# Patient Record
Sex: Female | Born: 1950 | Race: White | Hispanic: No | Smoking: Former smoker
Health system: Southern US, Community
[De-identification: ages and names within clinical notes are randomized; demographics above are authoritative.]

## PROBLEM LIST (undated history)

## (undated) DIAGNOSIS — G2581 Restless legs syndrome: Secondary | ICD-10-CM

## (undated) DIAGNOSIS — Z8719 Personal history of other diseases of the digestive system: Secondary | ICD-10-CM

## (undated) DIAGNOSIS — D509 Iron deficiency anemia, unspecified: Secondary | ICD-10-CM

## (undated) DIAGNOSIS — G4730 Sleep apnea, unspecified: Secondary | ICD-10-CM

## (undated) DIAGNOSIS — K580 Irritable bowel syndrome with diarrhea: Secondary | ICD-10-CM

## (undated) DIAGNOSIS — K219 Gastro-esophageal reflux disease without esophagitis: Secondary | ICD-10-CM

## (undated) DIAGNOSIS — M1712 Unilateral primary osteoarthritis, left knee: Secondary | ICD-10-CM

## (undated) DIAGNOSIS — F32A Depression, unspecified: Secondary | ICD-10-CM

## (undated) DIAGNOSIS — K638219 Small intestinal bacterial overgrowth, unspecified: Secondary | ICD-10-CM

## (undated) DIAGNOSIS — M1990 Unspecified osteoarthritis, unspecified site: Secondary | ICD-10-CM

## (undated) DIAGNOSIS — E785 Hyperlipidemia, unspecified: Secondary | ICD-10-CM

## (undated) DIAGNOSIS — M4800 Spinal stenosis, site unspecified: Secondary | ICD-10-CM

## (undated) DIAGNOSIS — G935 Compression of brain: Secondary | ICD-10-CM

## (undated) DIAGNOSIS — K6389 Other specified diseases of intestine: Secondary | ICD-10-CM

## (undated) DIAGNOSIS — Z8489 Family history of other specified conditions: Secondary | ICD-10-CM

## (undated) DIAGNOSIS — I10 Essential (primary) hypertension: Secondary | ICD-10-CM

## (undated) DIAGNOSIS — F419 Anxiety disorder, unspecified: Secondary | ICD-10-CM

## (undated) DIAGNOSIS — E119 Type 2 diabetes mellitus without complications: Secondary | ICD-10-CM

## (undated) DIAGNOSIS — D649 Anemia, unspecified: Secondary | ICD-10-CM

## (undated) DIAGNOSIS — Z9889 Other specified postprocedural states: Secondary | ICD-10-CM

## (undated) DIAGNOSIS — J302 Other seasonal allergic rhinitis: Secondary | ICD-10-CM

## (undated) DIAGNOSIS — F329 Major depressive disorder, single episode, unspecified: Secondary | ICD-10-CM

## (undated) DIAGNOSIS — R112 Nausea with vomiting, unspecified: Secondary | ICD-10-CM

## (undated) HISTORY — DX: Major depressive disorder, single episode, unspecified: F32.9

## (undated) HISTORY — DX: Sleep apnea, unspecified: G47.30

## (undated) HISTORY — DX: Hyperlipidemia, unspecified: E78.5

## (undated) HISTORY — DX: Spinal stenosis, site unspecified: M48.00

## (undated) HISTORY — DX: Depression, unspecified: F32.A

## (undated) HISTORY — DX: Unspecified osteoarthritis, unspecified site: M19.90

## (undated) HISTORY — DX: Gastro-esophageal reflux disease without esophagitis: K21.9

## (undated) HISTORY — PX: LAPAROSCOPIC GASTRIC SLEEVE RESECTION: SHX5895

## (undated) HISTORY — PX: HERNIA REPAIR: SHX51

## (undated) HISTORY — PX: BREAST BIOPSY: SHX20

## (undated) HISTORY — DX: Compression of brain: G93.5

## (undated) HISTORY — DX: Essential (primary) hypertension: I10

## (undated) HISTORY — DX: Other specified diseases of intestine: K63.89

## (undated) HISTORY — PX: LASIK: SHX215

## (undated) HISTORY — PX: NASAL SEPTUM SURGERY: SHX37

## (undated) HISTORY — DX: Type 2 diabetes mellitus without complications: E11.9

## (undated) HISTORY — DX: Small intestinal bacterial overgrowth, unspecified: K63.8219

## (undated) HISTORY — PX: OTHER SURGICAL HISTORY: SHX169

---

## 2005-10-18 HISTORY — PX: OTHER SURGICAL HISTORY: SHX169

## 2007-04-09 ENCOUNTER — Ambulatory Visit: Payer: Self-pay | Admitting: Family Medicine

## 2007-05-16 ENCOUNTER — Ambulatory Visit: Payer: Self-pay | Admitting: Family Medicine

## 2008-11-06 ENCOUNTER — Ambulatory Visit: Payer: Self-pay | Admitting: Family Medicine

## 2009-04-28 ENCOUNTER — Ambulatory Visit: Payer: Self-pay | Admitting: Family Medicine

## 2009-05-06 ENCOUNTER — Ambulatory Visit: Payer: Self-pay | Admitting: Family Medicine

## 2009-06-03 ENCOUNTER — Ambulatory Visit: Payer: Self-pay | Admitting: Gastroenterology

## 2009-06-07 ENCOUNTER — Ambulatory Visit: Payer: Self-pay | Admitting: Family Medicine

## 2010-02-10 ENCOUNTER — Ambulatory Visit: Payer: Self-pay | Admitting: General Surgery

## 2010-02-18 ENCOUNTER — Ambulatory Visit: Payer: Self-pay | Admitting: General Surgery

## 2010-06-18 ENCOUNTER — Emergency Department: Payer: Self-pay | Admitting: Emergency Medicine

## 2010-06-27 ENCOUNTER — Ambulatory Visit: Payer: Self-pay | Admitting: Family Medicine

## 2011-05-31 ENCOUNTER — Ambulatory Visit: Payer: Self-pay | Admitting: Family Medicine

## 2012-07-24 ENCOUNTER — Ambulatory Visit: Payer: Self-pay | Admitting: Specialist

## 2012-07-24 LAB — CBC WITH DIFFERENTIAL/PLATELET
Basophil %: 0.4 %
Eosinophil #: 0.2 10*3/uL (ref 0.0–0.7)
Eosinophil %: 2.9 %
Lymphocyte %: 17 %
MCH: 29.6 pg (ref 26.0–34.0)
MCHC: 34.4 g/dL (ref 32.0–36.0)
Monocyte %: 6 %
Neutrophil #: 5.2 10*3/uL (ref 1.4–6.5)
Platelet: 248 10*3/uL (ref 150–440)
RBC: 4.19 10*6/uL (ref 3.80–5.20)

## 2012-07-24 LAB — COMPREHENSIVE METABOLIC PANEL
Alkaline Phosphatase: 102 U/L (ref 50–136)
Anion Gap: 5 — ABNORMAL LOW (ref 7–16)
BUN: 17 mg/dL (ref 7–18)
Chloride: 104 mmol/L (ref 98–107)
Co2: 33 mmol/L — ABNORMAL HIGH (ref 21–32)
Creatinine: 0.88 mg/dL (ref 0.60–1.30)
EGFR (African American): >60
Glucose: 117 mg/dL — ABNORMAL HIGH (ref 65–99)
Osmolality: 286 (ref 275–301)
SGOT(AST): 26 U/L (ref 15–37)
SGPT (ALT): 26 U/L (ref 12–78)
Sodium: 142 mmol/L (ref 136–145)

## 2012-07-24 LAB — PROTIME-INR
INR: 1
Prothrombin Time: 13.5 secs (ref 11.5–14.7)

## 2012-07-24 LAB — IRON AND TIBC
Iron Bind.Cap.(Total): 296 ug/dL (ref 250–450)
Unbound Iron-Bind.Cap.: 243 ug/dL

## 2012-07-24 LAB — AMYLASE: Amylase: 31 U/L (ref 25–115)

## 2012-07-24 LAB — HEMOGLOBIN A1C: Hemoglobin A1C: 6.4 % — ABNORMAL HIGH (ref 4.2–6.3)

## 2012-07-24 LAB — APTT: Activated PTT: 29.7 secs (ref 23.6–35.9)

## 2012-07-24 LAB — FOLATE: Folic Acid: 7.3 ng/mL (ref 3.1–100.0)

## 2012-07-24 LAB — BILIRUBIN, DIRECT: Bilirubin, Direct: <0.1 mg/dL (ref 0.00–0.20)

## 2012-07-24 LAB — LIPASE, BLOOD: Lipase: 136 U/L (ref 73–393)

## 2012-07-24 LAB — FERRITIN: Ferritin (ARMC): 55 ng/mL (ref 8–388)

## 2012-08-16 ENCOUNTER — Ambulatory Visit: Payer: Self-pay | Admitting: Specialist

## 2012-08-18 ENCOUNTER — Ambulatory Visit: Payer: Self-pay | Admitting: Specialist

## 2015-07-15 DIAGNOSIS — F329 Major depressive disorder, single episode, unspecified: Secondary | ICD-10-CM

## 2015-07-15 DIAGNOSIS — F339 Major depressive disorder, recurrent, unspecified: Secondary | ICD-10-CM | POA: Insufficient documentation

## 2015-07-27 ENCOUNTER — Ambulatory Visit (INDEPENDENT_AMBULATORY_CARE_PROVIDER_SITE_OTHER): Payer: BC Managed Care – PPO | Admitting: Family Medicine

## 2015-07-27 ENCOUNTER — Encounter: Payer: Self-pay | Admitting: Family Medicine

## 2015-07-27 VITALS — BP 127/76 | HR 67 | Temp 98.7°F | Ht 62.0 in | Wt 162.0 lb

## 2015-07-27 DIAGNOSIS — Z0000 Encounter for general adult medical examination without abnormal findings: Secondary | ICD-10-CM

## 2015-07-27 DIAGNOSIS — Z1239 Encounter for other screening for malignant neoplasm of breast: Secondary | ICD-10-CM

## 2015-07-27 DIAGNOSIS — E785 Hyperlipidemia, unspecified: Secondary | ICD-10-CM | POA: Diagnosis not present

## 2015-07-27 DIAGNOSIS — F32A Depression, unspecified: Secondary | ICD-10-CM

## 2015-07-27 DIAGNOSIS — I10 Essential (primary) hypertension: Secondary | ICD-10-CM

## 2015-07-27 DIAGNOSIS — F329 Major depressive disorder, single episode, unspecified: Secondary | ICD-10-CM | POA: Diagnosis not present

## 2015-07-27 MED ORDER — BENAZEPRIL-HYDROCHLOROTHIAZIDE 20-25 MG PO TABS: 1.0000 | ORAL_TABLET | Freq: Every day | ORAL | Status: DC

## 2015-07-27 MED ORDER — ATORVASTATIN CALCIUM 40 MG PO TABS: 40.0000 mg | ORAL_TABLET | Freq: Every day | ORAL | Status: DC

## 2015-07-27 MED ORDER — LEVETIRACETAM 500 MG PO TABS: 500.0000 mg | ORAL_TABLET | Freq: Every day | ORAL | Status: DC

## 2015-07-27 MED ORDER — AMLODIPINE BESYLATE 5 MG PO TABS: 5.0000 mg | ORAL_TABLET | Freq: Every day | ORAL | Status: DC

## 2015-07-27 MED ORDER — LORAZEPAM 1 MG PO TABS: 0.5000 mg | ORAL_TABLET | Freq: Every day | ORAL | Status: DC | PRN

## 2015-07-27 NOTE — Assessment & Plan Note (Signed)
The current medical regimen is effective;  continue present plan and medications.  

## 2015-07-27 NOTE — Progress Notes (Signed)
BP 127/76 mmHg  Pulse 67  Temp(Src) 98.7 F (37.1 C)  Ht 5' 2" (1.575 m)  Wt 162 lb (73.483 kg)  BMI 29.62 kg/m2  SpO2 99%   Subjective:    Patient ID: Amanda Wang, female    DOB: Oct 13, 1951, 64 y.o.   MRN: [id number]  HPI: Amanda Wang is a 64 y.o. female  Chief Complaint  Patient presents with  . Annual Exam   patient for physical doing well reviewed medications taking without side effects or problems blood pressure cholesterol doing well no further seizures anxiety nerves doing well working therpist  Relevant past medical, surgical, family and social history reviewed and updated as indicated. Interim medical history since our last visit reviewed. Allergies and medications reviewed and updated.  Review of Systems  Constitutional: Negative.   HENT: Negative.   Eyes: Negative.   Respiratory: Negative.   Cardiovascular: Negative.   Gastrointestinal: Negative.   Endocrine: Negative.   Genitourinary: Negative.   Musculoskeletal: Negative.   Skin: Negative.   Allergic/Immunologic: Negative.   Neurological: Negative.   Hematological: Negative.   Psychiatric/Behavioral: Negative.     Per HPI unless specifically indicated above     Objective:    BP 127/76 mmHg  Pulse 67  Temp(Src) 98.7 F (37.1 C)  Ht 5' 2" (1.575 m)  Wt 162 lb (73.483 kg)  BMI 29.62 kg/m2  SpO2 99%  Wt Readings from Last 3 Encounters:  07/27/15 162 lb (73.483 kg)  04/14/15 166 lb (75.297 kg)    Physical Exam  Constitutional: She is oriented to person, place, and time. She appears well-developed and well-nourished.  HENT:  Head: Normocephalic and atraumatic.  Right Ear: External ear normal.  Left Ear: External ear normal.  Nose: Nose normal.  Mouth/Throat: Oropharynx is clear and moist.  Eyes: Conjunctivae and EOM are normal. Pupils are equal, round, and reactive to light.  Neck: Normal range of motion. Neck supple. Carotid bruit is not present.  Cardiovascular: Normal rate,  regular rhythm and normal heart sounds.   No murmur heard. Pulmonary/Chest: Effort normal and breath sounds normal. Right breast exhibits no mass and no tenderness. Left breast exhibits no mass and no tenderness. Breasts are symmetrical.  Abdominal: Soft. Bowel sounds are normal. There is no hepatosplenomegaly.  Musculoskeletal: Normal range of motion.  Neurological: She is alert and oriented to person, place, and time.  Skin: No rash noted.  Psychiatric: She has a normal mood and affect. Her behavior is normal. Judgment and thought content normal.    Results for orders placed or performed in visit on 07/24/12  Comprehensive metabolic panel  Result Value Ref Range   Glucose 117 (H) 65-99 mg/dL   BUN 17 7-18 mg/dL   Creatinine 0.88 0.60-1.30 mg/dL   Sodium 142 136-145 mmol/L   Potassium 4.2 3.5-5.1 mmol/L   Chloride 104 98-107 mmol/L   Co2 33 (H) 21-32 mmol/L   Calcium, Total 8.5 8.5-10.1 mg/dL   SGOT(AST) 26 15-37 Unit/L   SGPT (ALT) 26 12-78 U/L   Alkaline Phosphatase 102 50-136 Unit/L   Albumin 3.7 3.4-5.0 g/dL   Total Protein 7.7 6.4-8.2 g/dL   Bilirubin,Total 0.4 0.2-1.0 mg/dL   Osmolality 286 275-301   Anion Gap 5 (L) 7-16   EGFR (African American) >60    EGFR (Non-African Amer.) >60   Bilirubin, direct  Result Value Ref Range   Bilirubin, Direct < 0.1 0.00-0.20 mg/dL  Amylase  Result Value Ref Range   Amylase 31 25-115 Unit/L  Lipase, blood  Result Value Ref Range   Lipase 136 73-393 Unit/L  TSH  Result Value Ref Range   Thyroid Stimulating Horm 3.04 uIU/mL  Ferritin  Result Value Ref Range   Ferritin ([facility]) 55 8-388 ng/mL  Folate  Result Value Ref Range   Folic Acid 7.3 3.1-100.0 ng/mL  Magnesium  Result Value Ref Range   Magnesium 1.9 mg/dL  Phosphorus  Result Value Ref Range   Phosphorus 3.2 2.5-4.9 mg/dL  CBC with Differential/Platelet  Result Value Ref Range   WBC 7.0 3.6-11.0 x10 3/mm 3   RBC 4.19 3.80-5.20 X10 6/mm 3   HGB 12.4 12.0-16.0 g/dL    HCT 36.0 35.0-47.0 %   MCV 86 80-100 fL   MCH 29.6 26.0-34.0 pg   MCHC 34.4 32.0-36.0 g/dL   RDW 14.6 (H) 11.5-14.5 %   Platelet 248 150-440 x10 3/mm 3   Neutrophil % 73.7 %   Lymphocyte % 17.0 %   Monocyte % 6.0 %   Eosinophil % 2.9 %   Basophil % 0.4 %   Neutrophil # 5.2 1.4-6.5 x10 3/mm 3   Lymphocyte # 1.2 1.0-3.6 x10 3/mm 3   Monocyte # 0.4 0.2-0.9 x10 3/mm    Eosinophil # 0.2 0.0-0.7 x10 3/mm 3   Basophil # 0.0 0.0-0.1 x10 3/mm 3  Hemoglobin A1c  Result Value Ref Range   Hemoglobin A1C 6.4 (H) 4.2-6.3 %  Iron and TIBC  Result Value Ref Range   Iron 53 50-170 mcg/dL   Iron Bind.Cap.(Total) 296 250-450 mcg/dL   Unbound Iron-Bind.Cap. 243 mcg/dL   Iron Saturation 18 %  Protime-INR  Result Value Ref Range   Prothrombin Time 13.5 11.5-14.7 secs   INR 1.0   APTT  Result Value Ref Range   Activated PTT 29.7 23.6-35.9 secs      Assessment & Plan:   Problem List Items Addressed This Visit      Cardiovascular and Mediastinum   Hypertension    The current medical regimen is effective;  continue present plan and medications.       Relevant Medications   amLODipine (NORVASC) 5 MG tablet   benazepril-hydrochlorthiazide (LOTENSIN HCT) 20-25 MG per tablet   atorvastatin (LIPITOR) 40 MG tablet     Other   Hyperlipidemia    The current medical regimen is effective;  continue present plan and medications.       Relevant Medications   amLODipine (NORVASC) 5 MG tablet   benazepril-hydrochlorthiazide (LOTENSIN HCT) 20-25 MG per tablet   atorvastatin (LIPITOR) 40 MG tablet   Depression    The current medical regimen is effective;  continue present plan and medications.       Relevant Medications   VIIBRYD 20 MG TABS   LORazepam (ATIVAN) 1 MG tablet    Other Visit Diagnoses    Breast cancer screening    -  Primary    Relevant Orders    MM DIGITAL SCREENING BILATERAL    PE (physical exam), annual        Relevant Orders    CBC with Differential/Platelet     Comprehensive metabolic panel    Urinalysis, Routine w reflex microscopic (not at [facility])    TSH    Lipid panel        Follow up plan: Return in about 6 months (around 01/27/2016), or if symptoms worsen or fail to improve, for med f/u.       

## 2015-07-28 ENCOUNTER — Encounter: Payer: Self-pay | Admitting: Family Medicine

## 2015-07-28 LAB — COMPREHENSIVE METABOLIC PANEL
ALBUMIN: 4.5 g/dL (ref 3.6–4.8)
ALT: 13 IU/L (ref 0–32)
AST: 18 IU/L (ref 0–40)
Albumin/Globulin Ratio: 1.5 (ref 1.1–2.5)
Alkaline Phosphatase: 112 IU/L (ref 39–117)
BILIRUBIN TOTAL: 0.5 mg/dL (ref 0.0–1.2)
BUN / CREAT RATIO: 18 (ref 11–26)
BUN: 12 mg/dL (ref 8–27)
CHLORIDE: 101 mmol/L (ref 97–108)
CO2: 29 mmol/L (ref 18–29)
Calcium: 9.7 mg/dL (ref 8.7–10.3)
Creatinine, Ser: 0.65 mg/dL (ref 0.57–1.00)
GFR calc non Af Amer: 95 mL/min/{1.73_m2} (ref >59)
GFR, EST AFRICAN AMERICAN: 110 mL/min/{1.73_m2} (ref >59)
Globulin, Total: 3 g/dL (ref 1.5–4.5)
Glucose: 108 mg/dL — ABNORMAL HIGH (ref 65–99)
Potassium: 3.6 mmol/L (ref 3.5–5.2)
Sodium: 145 mmol/L — ABNORMAL HIGH (ref 134–144)
Total Protein: 7.5 g/dL (ref 6.0–8.5)

## 2015-07-28 LAB — LIPID PANEL
CHOL/HDL RATIO: 2.5 ratio (ref 0.0–4.4)
Cholesterol, Total: 169 mg/dL (ref 100–199)
HDL: 67 mg/dL (ref >39)
LDL CALC: 87 mg/dL (ref 0–99)
Triglycerides: 77 mg/dL (ref 0–149)
VLDL Cholesterol Cal: 15 mg/dL (ref 5–40)

## 2015-07-28 LAB — CBC WITH DIFFERENTIAL/PLATELET
Basophils Absolute: 0 10*3/uL (ref 0.0–0.2)
Basos: 0 %
EOS (ABSOLUTE): 0.2 10*3/uL (ref 0.0–0.4)
EOS: 2 %
Hematocrit: 36.3 % (ref 34.0–46.6)
Hemoglobin: 12.7 g/dL (ref 11.1–15.9)
IMMATURE GRANS (ABS): 0 10*3/uL (ref 0.0–0.1)
Immature Granulocytes: 0 %
LYMPHS: 22 %
Lymphocytes Absolute: 1.6 10*3/uL (ref 0.7–3.1)
MCH: 29.1 pg (ref 26.6–33.0)
MCHC: 35 g/dL (ref 31.5–35.7)
MCV: 83 fL (ref 79–97)
Monocytes Absolute: 0.6 10*3/uL (ref 0.1–0.9)
Monocytes: 9 %
NEUTROS ABS: 4.9 10*3/uL (ref 1.4–7.0)
NEUTROS PCT: 67 %
Platelets: 295 10*3/uL (ref 150–379)
RBC: 4.36 x10E6/uL (ref 3.77–5.28)
RDW: 14.7 % (ref 12.3–15.4)
WBC: 7.3 10*3/uL (ref 3.4–10.8)

## 2015-07-28 LAB — TSH: TSH: 2.11 u[IU]/mL (ref 0.450–4.500)

## 2015-07-28 LAB — URINALYSIS, ROUTINE W REFLEX MICROSCOPIC
BILIRUBIN UA: NEGATIVE
GLUCOSE, UA: NEGATIVE
Ketones, UA: NEGATIVE
Leukocytes, UA: NEGATIVE
Nitrite, UA: NEGATIVE
Protein, UA: NEGATIVE
RBC, UA: NEGATIVE
Specific Gravity, UA: 1.02 (ref 1.005–1.030)
Urobilinogen, Ur: 0.2 mg/dL (ref 0.2–1.0)
pH, UA: 5 (ref 5.0–7.5)

## 2015-08-03 ENCOUNTER — Ambulatory Visit
Admission: RE | Admit: 2015-08-03 | Discharge: 2015-08-03 | Disposition: A | Discharge status: Home / Self Care | Payer: BC Managed Care – PPO | Source: Ambulatory Visit | Attending: Family Medicine | Admitting: Family Medicine

## 2015-08-03 DIAGNOSIS — Z1239 Encounter for other screening for malignant neoplasm of breast: Secondary | ICD-10-CM

## 2015-08-03 DIAGNOSIS — Z1231 Encounter for screening mammogram for malignant neoplasm of breast: Secondary | ICD-10-CM | POA: Diagnosis not present

## 2015-08-05 ENCOUNTER — Other Ambulatory Visit: Payer: Self-pay | Admitting: Family Medicine

## 2015-12-31 ENCOUNTER — Telehealth: Payer: Self-pay | Admitting: Family Medicine

## 2015-12-31 ENCOUNTER — Other Ambulatory Visit: Payer: Self-pay | Admitting: Unknown Physician Specialty

## 2015-12-31 MED ORDER — VIIBRYD 20 MG PO TABS: 20.0000 mg | ORAL_TABLET | Freq: Every day | ORAL | Status: DC

## 2015-12-31 NOTE — Telephone Encounter (Signed)
Pt would like to have refill of viibryd sent to [facility] until she see's MAC 2/09 due to Dr Bridgett Larsson leaving the practice.  Marland Kitchen

## 2015-12-31 NOTE — Telephone Encounter (Signed)
Routing to provider  

## 2016-01-27 ENCOUNTER — Encounter: Payer: Self-pay | Admitting: Family Medicine

## 2016-01-27 ENCOUNTER — Ambulatory Visit (INDEPENDENT_AMBULATORY_CARE_PROVIDER_SITE_OTHER): Payer: BC Managed Care – PPO | Admitting: Family Medicine

## 2016-01-27 VITALS — BP 112/75 | HR 69 | Temp 98.0°F | Ht 61.7 in | Wt 163.0 lb

## 2016-01-27 DIAGNOSIS — F32A Depression, unspecified: Secondary | ICD-10-CM

## 2016-01-27 DIAGNOSIS — F329 Major depressive disorder, single episode, unspecified: Secondary | ICD-10-CM | POA: Diagnosis not present

## 2016-01-27 DIAGNOSIS — E785 Hyperlipidemia, unspecified: Secondary | ICD-10-CM | POA: Diagnosis not present

## 2016-01-27 DIAGNOSIS — I10 Essential (primary) hypertension: Secondary | ICD-10-CM

## 2016-01-27 MED ORDER — VIIBRYD 20 MG PO TABS: 20.0000 mg | ORAL_TABLET | Freq: Every day | ORAL | Status: DC

## 2016-01-27 MED ORDER — LORAZEPAM 1 MG PO TABS: 0.5000 mg | ORAL_TABLET | Freq: Every day | ORAL | Status: DC | PRN

## 2016-01-27 MED ORDER — LEVETIRACETAM 500 MG PO TABS: 500.0000 mg | ORAL_TABLET | Freq: Every day | ORAL | Status: DC

## 2016-01-27 NOTE — Assessment & Plan Note (Signed)
The current medical regimen is effective;  continue present plan and medications.  

## 2016-01-27 NOTE — Progress Notes (Signed)
BP 112/75 mmHg  Pulse 69  Temp(Src) 98 F (36.7 C)  Ht 5' 1.7" (1.567 m)  Wt 163 lb (73.936 kg)  BMI 30.11 kg/m2  SpO2 97%   Subjective:    Patient ID: Amanda Wang, female    DOB: 1951-02-15, 65 y.o.   MRN: [id number]  HPI: Amanda Wang is a 64 y.o. female  Chief Complaint  Patient presents with  . Hyperlipidemia  . Hypertension  . Depression   patient recheck cholesterol blood pressure doing well no complaints from medications Taking medications faithfully without side effects has good blood pressure control Seizures stable and nerves are stable Doing well with antidepressant medications no complaints Relevant past medical, surgical, family and social history reviewed and updated as indicated. Interim medical history since our last visit reviewed. Allergies and medications reviewed and updated.  Review of Systems  Constitutional: Negative.   Respiratory: Negative.   Cardiovascular: Negative.     Per HPI unless specifically indicated above     Objective:    BP 112/75 mmHg  Pulse 69  Temp(Src) 98 F (36.7 C)  Ht 5' 1.7" (1.567 m)  Wt 163 lb (73.936 kg)  BMI 30.11 kg/m2  SpO2 97%  Wt Readings from Last 3 Encounters:  01/27/16 163 lb (73.936 kg)  07/27/15 162 lb (73.483 kg)  04/14/15 166 lb (75.297 kg)    Physical Exam  Constitutional: She is oriented to person, place, and time. She appears well-developed and well-nourished. No distress.  HENT:  Head: Normocephalic and atraumatic.  Right Ear: Hearing normal.  Left Ear: Hearing normal.  Nose: Nose normal.  Eyes: Conjunctivae and lids are normal. Right eye exhibits no discharge. Left eye exhibits no discharge. No scleral icterus.  Cardiovascular: Normal rate, regular rhythm and normal heart sounds.   Pulmonary/Chest: Effort normal and breath sounds normal. No respiratory distress.  Musculoskeletal: Normal range of motion.  Neurological: She is alert and oriented to person, place, and time.  Skin:  Skin is intact. No rash noted.  Psychiatric: She has a normal mood and affect. Her speech is normal and behavior is normal. Judgment and thought content normal. Cognition and memory are normal.    Results for orders placed or performed in visit on 07/27/15  CBC with Differential/Platelet  Result Value Ref Range   WBC 7.3 3.4 - 10.8 x10E3/uL   RBC 4.36 3.77 - 5.28 x10E6/uL   Hemoglobin 12.7 11.1 - 15.9 g/dL   Hematocrit 36.3 34.0 - 46.6 %   MCV 83 79 - 97 fL   MCH 29.1 26.6 - 33.0 pg   MCHC 35.0 31.5 - 35.7 g/dL   RDW 14.7 12.3 - 15.4 %   Platelets 295 150 - 379 x10E3/uL   Neutrophils 67 %   Lymphs 22 %   Monocytes 9 %   Eos 2 %   Basos 0 %   Neutrophils Absolute 4.9 1.4 - 7.0 x10E3/uL   Lymphocytes Absolute 1.6 0.7 - 3.1 x10E3/uL   Monocytes Absolute 0.6 0.1 - 0.9 x10E3/uL   EOS (ABSOLUTE) 0.2 0.0 - 0.4 x10E3/uL   Basophils Absolute 0.0 0.0 - 0.2 x10E3/uL   Immature Granulocytes 0 %   Immature Grans (Abs) 0.0 0.0 - 0.1 x10E3/uL  Comprehensive metabolic panel  Result Value Ref Range   Glucose 108 (H) 65 - 99 mg/dL   BUN 12 8 - 27 mg/dL   Creatinine, Ser 0.65 0.57 - 1.00 mg/dL   GFR calc non Af Amer 95 >59 mL/min/1.73  GFR calc Af Amer 110 >59 mL/min/1.73   BUN/Creatinine Ratio 18 11 - 26   Sodium 145 (H) 134 - 144 mmol/L   Potassium 3.6 3.5 - 5.2 mmol/L   Chloride 101 97 - 108 mmol/L   CO2 29 18 - 29 mmol/L   Calcium 9.7 8.7 - 10.3 mg/dL   Total Protein 7.5 6.0 - 8.5 g/dL   Albumin 4.5 3.6 - 4.8 g/dL   Globulin, Total 3.0 1.5 - 4.5 g/dL   Albumin/Globulin Ratio 1.5 1.1 - 2.5   Bilirubin Total 0.5 0.0 - 1.2 mg/dL   Alkaline Phosphatase 112 39 - 117 IU/L   AST 18 0 - 40 IU/L   ALT 13 0 - 32 IU/L  Urinalysis, Routine w reflex microscopic (not at [facility])  Result Value Ref Range   Specific Gravity, UA 1.020 1.005 - 1.030   pH, UA 5.0 5.0 - 7.5   Color, UA Yellow Yellow   Appearance Ur Clear Clear   Leukocytes, UA Negative Negative   Protein, UA Negative Negative/Trace    Glucose, UA Negative Negative   Ketones, UA Negative Negative   RBC, UA Negative Negative   Bilirubin, UA Negative Negative   Urobilinogen, Ur 0.2 0.2 - 1.0 mg/dL   Nitrite, UA Negative Negative  TSH  Result Value Ref Range   TSH 2.110 0.450 - 4.500 uIU/mL  Lipid panel  Result Value Ref Range   Cholesterol, Total 169 100 - 199 mg/dL   Triglycerides 77 0 - 149 mg/dL   HDL 67 >39 mg/dL   VLDL Cholesterol Cal 15 5 - 40 mg/dL   LDL Calculated 87 0 - 99 mg/dL   Chol/HDL Ratio 2.5 0.0 - 4.4 ratio units      Assessment & Plan:   Problem List Items Addressed This Visit      Cardiovascular and Mediastinum   Hypertension - Primary    The current medical regimen is effective;  continue present plan and medications.       Relevant Orders   Basic metabolic panel     Other   Hyperlipidemia    The current medical regimen is effective;  continue present plan and medications.       Relevant Orders   ALT   AST   Lipid panel   Depression    The current medical regimen is effective;  continue present plan and medications.       Relevant Medications   LORazepam (ATIVAN) 1 MG tablet   VIIBRYD 20 MG TABS       Follow up plan: No Follow-up on file.

## 2016-06-06 ENCOUNTER — Telehealth: Payer: Self-pay

## 2016-06-06 NOTE — Telephone Encounter (Signed)
Phone call Discussed with patient having some venous stasis issues discussed compression and support hose

## 2016-06-06 NOTE — Telephone Encounter (Signed)
Patient states she has "blood pooling in both of her big toes"

## 2016-06-09 ENCOUNTER — Ambulatory Visit (INDEPENDENT_AMBULATORY_CARE_PROVIDER_SITE_OTHER): Payer: BC Managed Care – PPO | Admitting: Unknown Physician Specialty

## 2016-06-09 ENCOUNTER — Encounter: Payer: Self-pay | Admitting: Unknown Physician Specialty

## 2016-06-09 VITALS — BP 123/77 | HR 76 | Temp 98.6°F | Ht 61.9 in | Wt 148.4 lb

## 2016-06-09 DIAGNOSIS — J069 Acute upper respiratory infection, unspecified: Secondary | ICD-10-CM | POA: Diagnosis not present

## 2016-06-09 MED ORDER — AZITHROMYCIN 250 MG PO TABS: ORAL_TABLET | ORAL | Status: DC

## 2016-06-09 MED ORDER — GUAIFENESIN-CODEINE 100-10 MG/5ML PO SOLN: 10.0000 mL | Freq: Three times a day (TID) | ORAL | Status: DC | PRN

## 2016-06-09 NOTE — Progress Notes (Signed)
BP 123/77 mmHg  Pulse 76  Temp(Src) 98.6 F (37 C)  Ht 5' 1.9" (1.572 m)  Wt 148 lb 6.4 oz (67.314 kg)  BMI 27.24 kg/m2  SpO2 98%   Subjective:    Patient ID: Amanda Wang, female    DOB: 28-Jan-1951, 65 y.o.   MRN: [id number]  HPI: Amanda Wang is a 65 y.o. female  Chief Complaint  Patient presents with  . URI    pt states she has chest congestion, nasal congestion, ear pain, sinus pressure, headache, and phlegm. States symptoms started Wednesday.     URI  This is a new problem. The current episode started in the past 7 days. The problem has been rapidly worsening. There has been no fever. Associated symptoms include congestion, coughing, headaches and rhinorrhea. Pertinent negatives include no abdominal pain, chest pain, ear pain or nausea. Associated symptoms comments: Productive cough. She has tried nothing for the symptoms.     Relevant past medical, surgical, family and social history reviewed and updated as indicated. Interim medical history since our last visit reviewed. Allergies and medications reviewed and updated.  Review of Systems  HENT: Positive for congestion and rhinorrhea. Negative for ear pain.   Respiratory: Positive for cough.   Cardiovascular: Negative for chest pain.  Gastrointestinal: Negative for nausea and abdominal pain.  Neurological: Positive for headaches.    Per HPI unless specifically indicated above     Objective:    BP 123/77 mmHg  Pulse 76  Temp(Src) 98.6 F (37 C)  Ht 5' 1.9" (1.572 m)  Wt 148 lb 6.4 oz (67.314 kg)  BMI 27.24 kg/m2  SpO2 98%  Wt Readings from Last 3 Encounters:  06/09/16 148 lb 6.4 oz (67.314 kg)  01/27/16 163 lb (73.936 kg)  07/27/15 162 lb (73.483 kg)    Physical Exam  Constitutional: She is oriented to person, place, and time. She appears well-developed and well-nourished. No distress.  HENT:  Head: Normocephalic and atraumatic.  Right Ear: Tympanic membrane and ear canal normal.  Left Ear:  Tympanic membrane and ear canal normal.  Nose: Rhinorrhea present. Right sinus exhibits no maxillary sinus tenderness and no frontal sinus tenderness. Left sinus exhibits no maxillary sinus tenderness and no frontal sinus tenderness.  Mouth/Throat: Mucous membranes are normal. Posterior oropharyngeal erythema present.  Eyes: Conjunctivae and lids are normal. Right eye exhibits no discharge. Left eye exhibits no discharge. No scleral icterus.  Cardiovascular: Normal rate and regular rhythm.   Pulmonary/Chest: Effort normal and breath sounds normal. No respiratory distress.  Abdominal: Normal appearance. There is no splenomegaly or hepatomegaly.  Musculoskeletal: Normal range of motion.  Neurological: She is alert and oriented to person, place, and time.  Skin: Skin is intact. No rash noted. No pallor.  Psychiatric: She has a normal mood and affect. Her behavior is normal. Judgment and thought content normal.    Results for orders placed or performed in visit on 07/27/15  CBC with Differential/Platelet  Result Value Ref Range   WBC 7.3 3.4 - 10.8 x10E3/uL   RBC 4.36 3.77 - 5.28 x10E6/uL   Hemoglobin 12.7 11.1 - 15.9 g/dL   Hematocrit 36.3 34.0 - 46.6 %   MCV 83 79 - 97 fL   MCH 29.1 26.6 - 33.0 pg   MCHC 35.0 31.5 - 35.7 g/dL   RDW 14.7 12.3 - 15.4 %   Platelets 295 150 - 379 x10E3/uL   Neutrophils 67 %   Lymphs 22 %   Monocytes 9 %  Eos 2 %   Basos 0 %   Neutrophils Absolute 4.9 1.4 - 7.0 x10E3/uL   Lymphocytes Absolute 1.6 0.7 - 3.1 x10E3/uL   Monocytes Absolute 0.6 0.1 - 0.9 x10E3/uL   EOS (ABSOLUTE) 0.2 0.0 - 0.4 x10E3/uL   Basophils Absolute 0.0 0.0 - 0.2 x10E3/uL   Immature Granulocytes 0 %   Immature Grans (Abs) 0.0 0.0 - 0.1 x10E3/uL  Comprehensive metabolic panel  Result Value Ref Range   Glucose 108 (H) 65 - 99 mg/dL   BUN 12 8 - 27 mg/dL   Creatinine, Ser 0.65 0.57 - 1.00 mg/dL   GFR calc non Af Amer 95 >59 mL/min/1.73   GFR calc Af Amer 110 >59 mL/min/1.73    BUN/Creatinine Ratio 18 11 - 26   Sodium 145 (H) 134 - 144 mmol/L   Potassium 3.6 3.5 - 5.2 mmol/L   Chloride 101 97 - 108 mmol/L   CO2 29 18 - 29 mmol/L   Calcium 9.7 8.7 - 10.3 mg/dL   Total Protein 7.5 6.0 - 8.5 g/dL   Albumin 4.5 3.6 - 4.8 g/dL   Globulin, Total 3.0 1.5 - 4.5 g/dL   Albumin/Globulin Ratio 1.5 1.1 - 2.5   Bilirubin Total 0.5 0.0 - 1.2 mg/dL   Alkaline Phosphatase 112 39 - 117 IU/L   AST 18 0 - 40 IU/L   ALT 13 0 - 32 IU/L  Urinalysis, Routine w reflex microscopic (not at [facility])  Result Value Ref Range   Specific Gravity, UA 1.020 1.005 - 1.030   pH, UA 5.0 5.0 - 7.5   Color, UA Yellow Yellow   Appearance Ur Clear Clear   Leukocytes, UA Negative Negative   Protein, UA Negative Negative/Trace   Glucose, UA Negative Negative   Ketones, UA Negative Negative   RBC, UA Negative Negative   Bilirubin, UA Negative Negative   Urobilinogen, Ur 0.2 0.2 - 1.0 mg/dL   Nitrite, UA Negative Negative  TSH  Result Value Ref Range   TSH 2.110 0.450 - 4.500 uIU/mL  Lipid panel  Result Value Ref Range   Cholesterol, Total 169 100 - 199 mg/dL   Triglycerides 77 0 - 149 mg/dL   HDL 67 >39 mg/dL   VLDL Cholesterol Cal 15 5 - 40 mg/dL   LDL Calculated 87 0 - 99 mg/dL   Chol/HDL Ratio 2.5 0.0 - 4.4 ratio units      Assessment & Plan:   Problem List Items Addressed This Visit      Unprioritized   Upper respiratory infection - Primary   Relevant Medications   azithromycin (ZITHROMAX) 250 MG tablet      Rx for Codeine cough syrup.  A pack if symptoms worsening rather than improving  Follow up plan: Return if symptoms worsen or fail to improve.

## 2016-07-05 ENCOUNTER — Other Ambulatory Visit: Payer: Self-pay | Admitting: Family Medicine

## 2016-07-05 DIAGNOSIS — Z1231 Encounter for screening mammogram for malignant neoplasm of breast: Secondary | ICD-10-CM

## 2016-07-31 ENCOUNTER — Encounter: Payer: Self-pay | Admitting: Family Medicine

## 2016-07-31 ENCOUNTER — Ambulatory Visit (INDEPENDENT_AMBULATORY_CARE_PROVIDER_SITE_OTHER): Payer: BC Managed Care – PPO | Admitting: Family Medicine

## 2016-07-31 VITALS — BP 102/67 | HR 60 | Temp 98.2°F | Ht 62.7 in | Wt 146.0 lb

## 2016-07-31 DIAGNOSIS — E785 Hyperlipidemia, unspecified: Secondary | ICD-10-CM

## 2016-07-31 DIAGNOSIS — Z0000 Encounter for general adult medical examination without abnormal findings: Secondary | ICD-10-CM | POA: Diagnosis not present

## 2016-07-31 DIAGNOSIS — Z23 Encounter for immunization: Secondary | ICD-10-CM | POA: Diagnosis not present

## 2016-07-31 DIAGNOSIS — G4733 Obstructive sleep apnea (adult) (pediatric): Secondary | ICD-10-CM | POA: Insufficient documentation

## 2016-07-31 DIAGNOSIS — G4730 Sleep apnea, unspecified: Secondary | ICD-10-CM | POA: Diagnosis not present

## 2016-07-31 DIAGNOSIS — F329 Major depressive disorder, single episode, unspecified: Secondary | ICD-10-CM | POA: Diagnosis not present

## 2016-07-31 DIAGNOSIS — I10 Essential (primary) hypertension: Secondary | ICD-10-CM | POA: Diagnosis not present

## 2016-07-31 DIAGNOSIS — F32A Depression, unspecified: Secondary | ICD-10-CM

## 2016-07-31 LAB — URINALYSIS, ROUTINE W REFLEX MICROSCOPIC
Bilirubin, UA: NEGATIVE
Glucose, UA: NEGATIVE
NITRITE UA: NEGATIVE
PH UA: 7 (ref 5.0–7.5)
RBC UA: NEGATIVE
SPEC GRAV UA: 1.015 (ref 1.005–1.030)
Urobilinogen, Ur: 1 mg/dL (ref 0.2–1.0)

## 2016-07-31 LAB — MICROSCOPIC EXAMINATION

## 2016-07-31 MED ORDER — BENAZEPRIL-HYDROCHLOROTHIAZIDE 20-25 MG PO TABS: 1.0000 | ORAL_TABLET | Freq: Every day | ORAL | 4 refills | Status: DC

## 2016-07-31 MED ORDER — LORAZEPAM 1 MG PO TABS: 0.5000 mg | ORAL_TABLET | Freq: Every day | ORAL | 0 refills | Status: DC | PRN

## 2016-07-31 MED ORDER — ATORVASTATIN CALCIUM 40 MG PO TABS: 40.0000 mg | ORAL_TABLET | Freq: Every day | ORAL | 4 refills | Status: DC

## 2016-07-31 MED ORDER — LEVETIRACETAM 500 MG PO TABS: 500.0000 mg | ORAL_TABLET | Freq: Every day | ORAL | 4 refills | Status: DC

## 2016-07-31 MED ORDER — VIIBRYD 20 MG PO TABS: 20.0000 mg | ORAL_TABLET | Freq: Every day | ORAL | 4 refills | Status: DC

## 2016-07-31 MED ORDER — AMLODIPINE BESYLATE 5 MG PO TABS: 5.0000 mg | ORAL_TABLET | Freq: Every day | ORAL | 4 refills | Status: DC

## 2016-07-31 NOTE — Assessment & Plan Note (Signed)
The current medical regimen is effective;  continue present plan and medications.  

## 2016-07-31 NOTE — Progress Notes (Signed)
BP 102/67 (BP Location: Left Arm, Patient Position: Sitting, Cuff Size: Small)   Pulse 60   Temp 98.2 F (36.8 C)   Ht 5' 2.7" (1.593 m)   Wt 146 lb (66.2 kg)   SpO2 96%   BMI 26.11 kg/m    Subjective:    Patient ID: Amanda Wang, female    DOB: 1951-12-14, 65 y.o.   MRN: [id number]  HPI: Amanda Wang is a 65 y.o. female  Chief Complaint  Patient presents with  . Annual Exam  . Fatigue  . Insomnia  Patient with difficulty falling asleep as been ongoing long-term has had problems with her CPAP mask and is not using her CPAP unit. Does have daytime drowsiness area did reviewed sleep insomnia and sleep apnea patient will get new mask. We will use CPAP faithfully and then maybe consider sleep aids.  Depression stress doing okay 30 lorazepam last 6 months without problems but would really like more. Discuss using more in his frowned upon. 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   Relevant past medical, surgical, family and social history reviewed and updated as indicated. Interim medical history since our last visit reviewed. Allergies and medications reviewed and updated.  Review of Systems  Constitutional: Negative.   HENT: Negative.   Eyes: Negative.   Respiratory: Negative.   Cardiovascular: Negative.   Gastrointestinal: Negative.   Endocrine: Negative.   Genitourinary: Negative.   Musculoskeletal: Negative.   Skin: Negative.   Allergic/Immunologic: Negative.   Neurological: Negative.   Hematological: Negative.   Psychiatric/Behavioral: Negative.     Per HPI unless  specifically indicated above     Objective:    BP 102/67 (BP Location: Left Arm, Patient Position: Sitting, Cuff Size: Small)   Pulse 60   Temp 98.2 F (36.8 C)   Ht 5' 2.7" (1.593 m)   Wt 146 lb (66.2 kg)   SpO2 96%   BMI 26.11 kg/m   Wt Readings from Last 3 Encounters:  07/31/16 146 lb (66.2 kg)  06/09/16 148 lb 6.4 oz (67.3 kg)  01/27/16 163 lb (73.9 kg)    Physical Exam  Constitutional: She is oriented to person, place, and time. She appears well-developed and well-nourished.  HENT:  Head: Normocephalic and atraumatic.  Right Ear: External ear normal.  Left Ear: External ear normal.  Nose: Nose normal.  Mouth/Throat: Oropharynx is clear and moist.  Eyes: Conjunctivae and EOM are normal. Pupils are equal, round, and reactive to light.  Neck: Normal range of motion. Neck supple. Carotid bruit is not present.  Cardiovascular: Normal rate, regular rhythm and normal heart sounds.   No murmur heard. Pulmonary/Chest: Effort normal and breath sounds normal. She exhibits no mass. Right breast exhibits no mass, no skin change and no tenderness. Left breast exhibits no mass, no skin change and no tenderness. Breasts are symmetrical.  Abdominal: Soft. Bowel sounds are normal. There is no hepatosplenomegaly.  Musculoskeletal: Normal range of motion.  Neurological: She is alert and oriented to person, place, and time.  Skin: No rash noted.  Psychiatric: She has a normal mood and affect. Her behavior is normal. Judgment and thought content normal.  Assessment & Plan:   Problem List Items Addressed This Visit      Cardiovascular and Mediastinum   Hypertension    The current medical regimen is effective;  continue present plan and medications.       Relevant Medications   benazepril-hydrochlorthiazide (LOTENSIN HCT) 20-25 MG tablet   atorvastatin (LIPITOR) 40 MG tablet   amLODipine (NORVASC) 5 MG tablet     Other   Hyperlipidemia    The current medical regimen is  effective;  continue present plan and medications.       Relevant Medications   benazepril-hydrochlorthiazide (LOTENSIN HCT) 20-25 MG tablet   atorvastatin (LIPITOR) 40 MG tablet   amLODipine (NORVASC) 5 MG tablet   Depression    The current medical regimen is effective;  continue present plan and medications.       Relevant Medications   LORazepam (ATIVAN) 1 MG tablet   VIIBRYD 20 MG TABS   Sleep apnea    Encourage use of CPAP as noted in record       Other Visit Diagnoses    Annual physical exam    -  Primary   Relevant Orders   Urinalysis, Routine w reflex microscopic (not at [facility])   CBC with Differential/Platelet   Comprehensive metabolic panel   Lipid Panel w/o Chol/HDL Ratio   TSH   HIV antibody   Hepatitis C Antibody   Healthcare maintenance       Relevant Orders   Urinalysis, Routine w reflex microscopic (not at [facility])   CBC with Differential/Platelet   Comprehensive metabolic panel   Lipid Panel w/o Chol/HDL Ratio   TSH   HIV antibody   Hepatitis C Antibody   Need for Tdap vaccination       Relevant Orders   Tdap vaccine greater than or equal to 7yo IM (Completed)       Follow up plan: Return in about 6 months (around 01/31/2017).

## 2016-07-31 NOTE — Patient Instructions (Addendum)
Tdap Vaccine (Tetanus, Diphtheria and Pertussis): What You Need to Know 1. Why get vaccinated? Tetanus, diphtheria and pertussis are very serious diseases. Tdap vaccine can protect us from these diseases. And, Tdap vaccine given to pregnant women can protect newborn babies against pertussis. TETANUS (Lockjaw) is rare in the [place] today. It causes painful muscle tightening and stiffness, usually all over the body.  It can lead to tightening of muscles in the head and neck so you can't open your mouth, swallow, or sometimes even breathe. Tetanus kills about 1 out of 10 people who are infected even after receiving the best medical care. DIPHTHERIA is also rare in the [place] today. It can cause a thick coating to form in the back of the throat.  It can lead to breathing problems, heart failure, paralysis, and death. PERTUSSIS (Whooping Cough) causes severe coughing spells, which can cause difficulty breathing, vomiting and disturbed sleep.  It can also lead to weight loss, incontinence, and rib fractures. Up to 2 in 100 adolescents and 5 in 100 adults with pertussis are hospitalized or have complications, which could include pneumonia or death. These diseases are caused by bacteria. Diphtheria and pertussis are spread from person to person through secretions from coughing or sneezing. Tetanus enters the body through cuts, scratches, or wounds. Before vaccines, as many as 200,000 cases of diphtheria, 200,000 cases of pertussis, and hundreds of cases of tetanus, were reported in the [place] each year. Since vaccination began, reports of cases for tetanus and diphtheria have dropped by about 99% and for pertussis by about 80%. 2. Tdap vaccine Tdap vaccine can protect adolescents and adults from tetanus, diphtheria, and pertussis. One dose of Tdap is routinely given at age 11 or 12. People who did not get Tdap at that age should get it as soon as possible. Tdap is especially important  for healthcare professionals and anyone having close contact with a baby younger than 12 months. Pregnant women should get a dose of Tdap during every pregnancy, to protect the newborn from pertussis. Infants are most at risk for severe, life-threatening complications from pertussis. Another vaccine, called Td, protects against tetanus and diphtheria, but not pertussis. A Td booster should be given every 10 years. Tdap may be given as one of these boosters if you have never gotten Tdap before. Tdap may also be given after a severe cut or burn to prevent tetanus infection. Your doctor or the person giving you the vaccine can give you more information. Tdap may safely be given at the same time as other vaccines. 3. Some people should not get this vaccine  A person who has ever had a life-threatening allergic reaction after a previous dose of any diphtheria, tetanus or pertussis containing vaccine, OR has a severe allergy to any part of this vaccine, should not get Tdap vaccine. Tell the person giving the vaccine about any severe allergies.  Anyone who had coma or long repeated seizures within 7 days after a childhood dose of DTP or DTaP, or a previous dose of Tdap, should not get Tdap, unless a cause other than the vaccine was found. They can still get Td.  Talk to your doctor if you:  have seizures or another nervous system problem,  had severe pain or swelling after any vaccine containing diphtheria, tetanus or pertussis,  ever had a condition called Guillain-Barr Syndrome (GBS),  aren't feeling well on the day the shot is scheduled. 4. Risks With any medicine, including vaccines, there is   a chance of side effects. These are usually mild and go away on their own. Serious reactions are also possible but are rare. Most people who get Tdap vaccine do not have any problems with it. Mild problems following Tdap (Did not interfere with activities)  Pain where the shot was given (about 3 in 4  adolescents or 2 in 3 adults)  Redness or swelling where the shot was given (about 1 person in 5)  Mild fever of at least 100.4F (up to about 1 in 25 adolescents or 1 in 100 adults)  Headache (about 3 or 4 people in 10)  Tiredness (about 1 person in 3 or 4)  Nausea, vomiting, diarrhea, stomach ache (up to 1 in 4 adolescents or 1 in 10 adults)  Chills, sore joints (about 1 person in 10)  Body aches (about 1 person in 3 or 4)  Rash, swollen glands (uncommon) Moderate problems following Tdap (Interfered with activities, but did not require medical attention)  Pain where the shot was given (up to 1 in 5 or 6)  Redness or swelling where the shot was given (up to about 1 in 16 adolescents or 1 in 12 adults)  Fever over 102F (about 1 in 100 adolescents or 1 in 250 adults)  Headache (about 1 in 7 adolescents or 1 in 10 adults)  Nausea, vomiting, diarrhea, stomach ache (up to 1 or 3 people in 100)  Swelling of the entire arm where the shot was given (up to about 1 in 500). Severe problems following Tdap (Unable to perform usual activities; required medical attention)  Swelling, severe pain, bleeding and redness in the arm where the shot was given (rare). Problems that could happen after any vaccine:  People sometimes faint after a medical procedure, including vaccination. Sitting or lying down for about 15 minutes can help prevent fainting, and injuries caused by a fall. Tell your doctor if you feel dizzy, or have vision changes or ringing in the ears.  Some people get severe pain in the shoulder and have difficulty moving the arm where a shot was given. This happens very rarely.  Any medication can cause a severe allergic reaction. Such reactions from a vaccine are very rare, estimated at fewer than 1 in a million doses, and would happen within a few minutes to a few hours after the vaccination. As with any medicine, there is a very remote chance of a vaccine causing a serious  injury or death. The safety of vaccines is always being monitored. For more information, visit: www.cdc.gov/vaccinesafety/ 5. What if there is a serious problem? What should I look for?  Look for anything that concerns you, such as signs of a severe allergic reaction, very high fever, or unusual behavior.  Signs of a severe allergic reaction can include hives, swelling of the face and throat, difficulty breathing, a fast heartbeat, dizziness, and weakness. These would usually start a few minutes to a few hours after the vaccination. What should I do?  If you think it is a severe allergic reaction or other emergency that can't wait, call 9-1-1 or get the person to the nearest hospital. Otherwise, call your doctor.  Afterward, the reaction should be reported to the Vaccine Adverse Event Reporting System (VAERS). Your doctor might file this report, or you can do it yourself through the VAERS web site at www.vaers.hhs.gov, or by calling 1-800-822-7967. VAERS does not give medical advice.  6. The National Vaccine Injury Compensation Program The National Vaccine Injury Compensation Program (  VICP) is a federal program that was created to compensate people who may have been injured by certain vaccines. Persons who believe they may have been injured by a vaccine can learn about the program and about filing a claim by calling 1-800-338-2382 or visiting the VICP website at www.hrsa.gov/vaccinecompensation. There is a time limit to file a claim for compensation. 7. How can I learn more?  Ask your doctor. He or she can give you the vaccine package insert or suggest other sources of information.  Call your local or state health department.  Contact the Centers for Disease Control and Prevention (CDC):  Call 1-800-232-4636 (1-800-CDC-INFO) or  Visit CDC's website at www.cdc.gov/vaccines CDC Tdap Vaccine VIS (02/10/14)   This information is not intended to replace advice given to you by your health care  provider. Make sure you discuss any questions you have with your health care provider.   Document Released: 06/04/2012 Document Revised: 12/25/2014 Document Reviewed: 03/18/2014 Elsevier Interactive Patient Education 2016 [employer]vier Inc.  

## 2016-07-31 NOTE — Assessment & Plan Note (Signed)
Encourage use of CPAP as noted in record

## 2016-08-01 ENCOUNTER — Encounter: Payer: Self-pay | Admitting: Family Medicine

## 2016-08-01 LAB — CBC WITH DIFFERENTIAL/PLATELET
Basophils Absolute: 0 10*3/uL (ref 0.0–0.2)
Basos: 1 %
EOS (ABSOLUTE): 0.1 10*3/uL (ref 0.0–0.4)
EOS: 2 %
HEMATOCRIT: 37.1 % (ref 34.0–46.6)
HEMOGLOBIN: 12.2 g/dL (ref 11.1–15.9)
Immature Grans (Abs): 0 10*3/uL (ref 0.0–0.1)
Immature Granulocytes: 0 %
LYMPHS ABS: 1.2 10*3/uL (ref 0.7–3.1)
Lymphs: 19 %
MCH: 28.3 pg (ref 26.6–33.0)
MCHC: 32.9 g/dL (ref 31.5–35.7)
MCV: 86 fL (ref 79–97)
MONOCYTES: 7 %
MONOS ABS: 0.4 10*3/uL (ref 0.1–0.9)
NEUTROS ABS: 4.6 10*3/uL (ref 1.4–7.0)
Neutrophils: 71 %
Platelets: 294 10*3/uL (ref 150–379)
RBC: 4.31 x10E6/uL (ref 3.77–5.28)
RDW: 15 % (ref 12.3–15.4)
WBC: 6.4 10*3/uL (ref 3.4–10.8)

## 2016-08-01 LAB — COMPREHENSIVE METABOLIC PANEL
A/G RATIO: 1.6 (ref 1.2–2.2)
ALBUMIN: 4.2 g/dL (ref 3.6–4.8)
ALK PHOS: 99 IU/L (ref 39–117)
ALT: 12 IU/L (ref 0–32)
AST: 15 IU/L (ref 0–40)
BILIRUBIN TOTAL: 0.5 mg/dL (ref 0.0–1.2)
BUN / CREAT RATIO: 17 (ref 12–28)
BUN: 12 mg/dL (ref 8–27)
CHLORIDE: 102 mmol/L (ref 96–106)
CO2: 27 mmol/L (ref 18–29)
CREATININE: 0.7 mg/dL (ref 0.57–1.00)
Calcium: 9.2 mg/dL (ref 8.7–10.3)
GFR calc Af Amer: 107 mL/min/{1.73_m2} (ref >59)
GFR calc non Af Amer: 93 mL/min/{1.73_m2} (ref >59)
GLOBULIN, TOTAL: 2.6 g/dL (ref 1.5–4.5)
Glucose: 98 mg/dL (ref 65–99)
POTASSIUM: 4.1 mmol/L (ref 3.5–5.2)
SODIUM: 144 mmol/L (ref 134–144)
Total Protein: 6.8 g/dL (ref 6.0–8.5)

## 2016-08-01 LAB — HEPATITIS C ANTIBODY

## 2016-08-01 LAB — HIV ANTIBODY (ROUTINE TESTING W REFLEX): HIV Screen 4th Generation wRfx: NONREACTIVE

## 2016-08-01 LAB — LIPID PANEL W/O CHOL/HDL RATIO
CHOLESTEROL TOTAL: 154 mg/dL (ref 100–199)
HDL: 62 mg/dL (ref >39)
LDL CALC: 74 mg/dL (ref 0–99)
Triglycerides: 90 mg/dL (ref 0–149)
VLDL Cholesterol Cal: 18 mg/dL (ref 5–40)

## 2016-08-01 LAB — TSH: TSH: 3.25 u[IU]/mL (ref 0.450–4.500)

## 2016-08-03 ENCOUNTER — Other Ambulatory Visit: Payer: Self-pay | Admitting: Family Medicine

## 2016-08-03 ENCOUNTER — Ambulatory Visit
Admission: RE | Admit: 2016-08-03 | Discharge: 2016-08-03 | Disposition: A | Discharge status: Home / Self Care | Payer: BC Managed Care – PPO | Source: Ambulatory Visit | Attending: Family Medicine | Admitting: Family Medicine

## 2016-08-03 DIAGNOSIS — Z1231 Encounter for screening mammogram for malignant neoplasm of breast: Secondary | ICD-10-CM | POA: Diagnosis not present

## 2016-09-20 ENCOUNTER — Ambulatory Visit: Payer: BC Managed Care – PPO | Admitting: Family Medicine

## 2016-09-26 ENCOUNTER — Ambulatory Visit (INDEPENDENT_AMBULATORY_CARE_PROVIDER_SITE_OTHER): Payer: BC Managed Care – PPO | Admitting: Family Medicine

## 2016-09-26 ENCOUNTER — Encounter: Payer: Self-pay | Admitting: Family Medicine

## 2016-09-26 VITALS — BP 101/65 | HR 62 | Temp 98.1°F | Ht 62.1 in | Wt 147.4 lb

## 2016-09-26 DIAGNOSIS — Z23 Encounter for immunization: Secondary | ICD-10-CM | POA: Diagnosis not present

## 2016-09-26 DIAGNOSIS — R079 Chest pain, unspecified: Secondary | ICD-10-CM | POA: Diagnosis not present

## 2016-09-26 NOTE — Progress Notes (Signed)
BP 101/65 (BP Location: Left Arm, Patient Position: Sitting, Cuff Size: Normal)   Pulse 62   Temp 98.1 F (36.7 C)   Ht 5' 2.1" (1.577 m)   Wt 147 lb 6.4 oz (66.9 kg)   SpO2 99%   BMI 26.87 kg/m    Subjective:    Patient ID: Amanda Wang, female    DOB: 09/23/1951, 65 y.o.   MRN: [id number]  HPI: Amanda Wang is a 65 y.o. female  Chief Complaint  Patient presents with  . Cyst    On back of neck  Patient with small cyst on the back just above C7 is just noticed it over the last month or so not really changed in size nontender movable and soft.  Patient also [place] at the beach just sitting had a spell of chest pain lasting about 5 minutes took some aspirin and no further episodes no chest pain or symptoms with climbing steps.  Relevant past medical, surgical, family and social history reviewed and updated as indicated. Interim medical history since our last visit reviewed. Allergies and medications reviewed and updated.  Review of Systems  Constitutional: Negative.   Respiratory: Negative.   Cardiovascular: Negative.     Per HPI unless specifically indicated above     Objective:    BP 101/65 (BP Location: Left Arm, Patient Position: Sitting, Cuff Size: Normal)   Pulse 62   Temp 98.1 F (36.7 C)   Ht 5' 2.1" (1.577 m)   Wt 147 lb 6.4 oz (66.9 kg)   SpO2 99%   BMI 26.87 kg/m   Wt Readings from Last 3 Encounters:  09/26/16 147 lb 6.4 oz (66.9 kg)  07/31/16 146 lb (66.2 kg)  06/09/16 148 lb 6.4 oz (67.3 kg)    Physical Exam  Constitutional: She is oriented to person, place, and time. She appears well-developed and well-nourished. No distress.  HENT:  Head: Normocephalic and atraumatic.  Right Ear: Hearing normal.  Left Ear: Hearing normal.  Nose: Nose normal.  Eyes: Conjunctivae and lids are normal. Right eye exhibits no discharge. Left eye exhibits no discharge. No scleral icterus.  Cardiovascular: Normal rate, regular rhythm and normal heart  sounds.   Pulmonary/Chest: Effort normal and breath sounds normal. No respiratory distress.  Musculoskeletal: Normal range of motion.  Neurological: She is alert and oriented to person, place, and time.  Skin: Skin is intact. No rash noted.  Psychiatric: She has a normal mood and affect. Her speech is normal and behavior is normal. Judgment and thought content normal. Cognition and memory are normal.   EKG normal  Results for orders placed or performed in visit on 07/31/16  Microscopic Examination  Result Value Ref Range   WBC, UA 0-5 0 - 5 /hpf   RBC, UA 0-2 0 - 2 /hpf   Epithelial Cells (non renal) 0-10 0 - 10 /hpf   Mucus, UA Present Not Estab.   Bacteria, UA Few None seen/Few  Urinalysis, Routine w reflex microscopic (not at [facility])  Result Value Ref Range   Specific Gravity, UA 1.015 1.005 - 1.030   pH, UA 7.0 5.0 - 7.5   Color, UA Yellow Yellow   Appearance Ur Clear Clear   Leukocytes, UA Trace (A) Negative   Protein, UA 1+ (A) Negative/Trace   Glucose, UA Negative Negative   Ketones, UA Trace (A) Negative   RBC, UA Negative Negative   Bilirubin, UA Negative Negative   Urobilinogen, Ur 1.0 0.2 - 1.0 mg/dL  Nitrite, UA Negative Negative   Microscopic Examination See below:   CBC with Differential/Platelet  Result Value Ref Range   WBC 6.4 3.4 - 10.8 x10E3/uL   RBC 4.31 3.77 - 5.28 x10E6/uL   Hemoglobin 12.2 11.1 - 15.9 g/dL   Hematocrit 37.1 34.0 - 46.6 %   MCV 86 79 - 97 fL   MCH 28.3 26.6 - 33.0 pg   MCHC 32.9 31.5 - 35.7 g/dL   RDW 15.0 12.3 - 15.4 %   Platelets 294 150 - 379 x10E3/uL   Neutrophils 71 %   Lymphs 19 %   Monocytes 7 %   Eos 2 %   Basos 1 %   Neutrophils Absolute 4.6 1.4 - 7.0 x10E3/uL   Lymphocytes Absolute 1.2 0.7 - 3.1 x10E3/uL   Monocytes Absolute 0.4 0.1 - 0.9 x10E3/uL   EOS (ABSOLUTE) 0.1 0.0 - 0.4 x10E3/uL   Basophils Absolute 0.0 0.0 - 0.2 x10E3/uL   Immature Granulocytes 0 %   Immature Grans (Abs) 0.0 0.0 - 0.1 x10E3/uL    Comprehensive metabolic panel  Result Value Ref Range   Glucose 98 65 - 99 mg/dL   BUN 12 8 - 27 mg/dL   Creatinine, Ser 0.70 0.57 - 1.00 mg/dL   GFR calc non Af Amer 93 >59 mL/min/1.73   GFR calc Af Amer 107 >59 mL/min/1.73   BUN/Creatinine Ratio 17 12 - 28   Sodium 144 134 - 144 mmol/L   Potassium 4.1 3.5 - 5.2 mmol/L   Chloride 102 96 - 106 mmol/L   CO2 27 18 - 29 mmol/L   Calcium 9.2 8.7 - 10.3 mg/dL   Total Protein 6.8 6.0 - 8.5 g/dL   Albumin 4.2 3.6 - 4.8 g/dL   Globulin, Total 2.6 1.5 - 4.5 g/dL   Albumin/Globulin Ratio 1.6 1.2 - 2.2   Bilirubin Total 0.5 0.0 - 1.2 mg/dL   Alkaline Phosphatase 99 39 - 117 IU/L   AST 15 0 - 40 IU/L   ALT 12 0 - 32 IU/L  Lipid Panel w/o Chol/HDL Ratio  Result Value Ref Range   Cholesterol, Total 154 100 - 199 mg/dL   Triglycerides 90 0 - 149 mg/dL   HDL 62 >39 mg/dL   VLDL Cholesterol Cal 18 5 - 40 mg/dL   LDL Calculated 74 0 - 99 mg/dL  TSH  Result Value Ref Range   TSH 3.250 0.450 - 4.500 uIU/mL  HIV antibody  Result Value Ref Range   HIV Screen 4th Generation wRfx Non Reactive Non Reactive  Hepatitis C Antibody  Result Value Ref Range   Hep C Virus Ab <0.1 0.0 - 0.9 s/co ratio      Assessment & Plan:   Problem List Items Addressed This Visit    None    Visit Diagnoses    Need for influenza vaccination    -  Primary   Relevant Orders   Flu Vaccine QUAD 36+ mos IM (Fluarix & Fluzone Quad PF (Completed)   Chest pain at rest       Noncardiac chest pain will observe comes recurrent or bothersome again will further evaluate.   Relevant Orders   EKG 12-Lead (Completed)     For cystic lesion on neck will observe if becomes bothersome reevaluate  Follow up plan: Return for As scheduled.

## 2016-11-13 ENCOUNTER — Encounter: Payer: Self-pay | Admitting: Family Medicine

## 2016-11-13 ENCOUNTER — Ambulatory Visit (INDEPENDENT_AMBULATORY_CARE_PROVIDER_SITE_OTHER): Payer: BC Managed Care – PPO | Admitting: Family Medicine

## 2016-11-13 VITALS — BP 113/73 | HR 73 | Temp 99.0°F | Wt 144.0 lb

## 2016-11-13 DIAGNOSIS — M79604 Pain in right leg: Secondary | ICD-10-CM

## 2016-11-13 DIAGNOSIS — B9789 Other viral agents as the cause of diseases classified elsewhere: Secondary | ICD-10-CM

## 2016-11-13 DIAGNOSIS — J069 Acute upper respiratory infection, unspecified: Secondary | ICD-10-CM | POA: Diagnosis not present

## 2016-11-13 MED ORDER — MELOXICAM 15 MG PO TABS: 15.0000 mg | ORAL_TABLET | Freq: Every day | ORAL | 0 refills | Status: DC

## 2016-11-13 MED ORDER — BENZONATATE 100 MG PO CAPS: 200.0000 mg | ORAL_CAPSULE | Freq: Three times a day (TID) | ORAL | 0 refills | Status: DC | PRN

## 2016-11-13 MED ORDER — CYCLOBENZAPRINE HCL 10 MG PO TABS: 10.0000 mg | ORAL_TABLET | Freq: Three times a day (TID) | ORAL | 0 refills | Status: DC | PRN

## 2016-11-13 MED ORDER — PREDNISONE 20 MG PO TABS: 40.0000 mg | ORAL_TABLET | Freq: Every day | ORAL | 0 refills | Status: DC

## 2016-11-13 MED ORDER — AZITHROMYCIN 250 MG PO TABS: ORAL_TABLET | ORAL | 0 refills | Status: DC

## 2016-11-13 NOTE — Patient Instructions (Signed)
Follow up as needed

## 2016-11-13 NOTE — Progress Notes (Signed)
BP 113/73   Pulse 73   Temp 99 F (37.2 C)   Wt 144 lb (65.3 kg)   SpO2 100%   BMI 26.25 kg/m    Subjective:    Patient ID: Amanda Wang, female    DOB: [birth year], 65 y.o.   MRN: [id number]  HPI: Amanda Wang is a 65 y.o. female  Chief Complaint  Patient presents with  . URI    sick since Friday. Chest and head congestion, headache, teeth ache, productive cough, sore throat.   . Leg Pain    right leg x over a month but worse in the last week; was told she has femoral nerve pain and was treated with prednisone, not any better.   Patient presents with congestion, sinus HAs, tooth pain, sore throat, and productive cough x 4 days. Has had intermittent fevers and chills. Has not been taking anything OTC as she states she didn't know what to take. Denies CP, SOB, ear pain. Works as a Pharmacist, hospital so lots of sick contacts.   Also wanting to follow up on some right upper/lateral leg burning pain that she went to UC for about a week ago. Was given prednisone taper which helped some but did not take issue away. No weakness, numbness, or radiation down leg. Having cramping in b/l legs the past few months but states that feels unrelated. Does have hx of spinal stenosis, but hasn't had any issues with back or legs over the past 7-8 years since diagnosed.   Past Medical History:  Diagnosis Date  . Arthritis   . 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 Depression   . GERD (gastroesophageal reflux disease)   . Hyperlipidemia   . Hypertension   . Sleep apnea   . Spinal stenosis    Social History   Social History  . Marital status: Legally Separated    Spouse name: N/A  . Number of children: N/A  . Years of education: N/A   Occupational History  . Not on file.   Social History Main Topics  . Smoking status: Former Smoker    Types: Cigarettes    Quit date: 07/26/1977  . Smokeless tobacco: Never Used  . Alcohol use 0.0 oz/week     Comment: on occasion  . Drug use: No  . Sexual  activity: Not on file   Other Topics Concern  . Not on file   Social History Narrative  . No narrative on file    Relevant past medical, surgical, family and social history reviewed and updated as indicated. Interim medical history since our last visit reviewed. Allergies and medications reviewed and updated.  Review of Systems  Constitutional: Positive for chills, diaphoresis and fever.  HENT: Positive for congestion, rhinorrhea, sinus pain, sinus pressure and sore throat.   Eyes: Negative.   Respiratory: Positive for cough.   Cardiovascular: Negative.   Gastrointestinal: Negative.   Genitourinary: Negative.   Musculoskeletal: Negative.   Skin: Negative.   Neurological: Positive for headaches.  Psychiatric/Behavioral: Negative.     Per HPI unless specifically indicated above     Objective:    BP 113/73   Pulse 73   Temp 99 F (37.2 C)   Wt 144 lb (65.3 kg)   SpO2 100%   BMI 26.25 kg/m   Wt Readings from Last 3 Encounters:  11/13/16 144 lb (65.3 kg)  09/26/16 147 lb 6.4 oz (66.9 kg)  07/31/16 146 lb (66.2 kg)    Physical Exam  Constitutional: She is oriented to person, place, and time. She appears well-developed and well-nourished. No distress.  HENT:  Head: Atraumatic.  Right Ear: External ear normal.  Left Ear: External ear normal.  Oropharynx erythematous Nasal mucosa injected  Eyes: Conjunctivae are normal. No scleral icterus.  Neck: Normal range of motion. Neck supple.  Cardiovascular: Normal rate and normal heart sounds.   Pulmonary/Chest: Effort normal and breath sounds normal. No respiratory distress.  Musculoskeletal: Normal range of motion.  Neurological: She is alert and oriented to person, place, and time.  Skin: Skin is warm and dry.  Psychiatric: She has a normal mood and affect. Her behavior is normal.  Nursing note and vitals reviewed.     Assessment & Plan:   Problem List Items Addressed This Visit    None    Visit Diagnoses     Viral upper respiratory tract infection    -  Primary   Tessalon perles sent. Discussed mucinex, rest, good hydration. Z-pak at pharmacy in case she is worsening by end of week for her to pick up then   Relevant Medications   azithromycin (ZITHROMAX) 250 MG tablet   Leg pain, lateral, right       Suspect IT band irritation, will give more prednisone, start flexeril and gentle stretches. Continue soaks, muscle rubs. Meloxicam after prednisone completed       Follow up plan: Return if symptoms worsen or fail to improve.

## 2016-12-22 ENCOUNTER — Telehealth: Payer: Self-pay | Admitting: Family Medicine

## 2016-12-22 MED ORDER — CYCLOBENZAPRINE HCL 10 MG PO TABS: 10.0000 mg | ORAL_TABLET | Freq: Three times a day (TID) | ORAL | 0 refills | Status: DC | PRN

## 2016-12-22 NOTE — Telephone Encounter (Signed)
Routing to provider  

## 2016-12-22 NOTE — Telephone Encounter (Signed)
Patient was prescribed cyclobenzaprine 10mg  she took it once at bedtime.  She is out of the medication and would like to see if she could get a refill on this before she sees Dr. [facility]  [phone]

## 2016-12-22 NOTE — Telephone Encounter (Signed)
Refill sent to [facility]

## 2017-02-01 ENCOUNTER — Ambulatory Visit (INDEPENDENT_AMBULATORY_CARE_PROVIDER_SITE_OTHER): Payer: BC Managed Care – PPO | Admitting: Family Medicine

## 2017-02-01 VITALS — BP 106/73 | HR 68 | Ht 62.0 in | Wt 151.8 lb

## 2017-02-01 DIAGNOSIS — Z9884 Bariatric surgery status: Secondary | ICD-10-CM | POA: Insufficient documentation

## 2017-02-01 DIAGNOSIS — Z9889 Other specified postprocedural states: Secondary | ICD-10-CM | POA: Diagnosis not present

## 2017-02-01 DIAGNOSIS — I10 Essential (primary) hypertension: Secondary | ICD-10-CM

## 2017-02-01 DIAGNOSIS — E7800 Pure hypercholesterolemia, unspecified: Secondary | ICD-10-CM

## 2017-02-01 DIAGNOSIS — R5383 Other fatigue: Secondary | ICD-10-CM | POA: Diagnosis not present

## 2017-02-01 MED ORDER — CYCLOBENZAPRINE HCL 10 MG PO TABS: 10.0000 mg | ORAL_TABLET | Freq: Three times a day (TID) | ORAL | 1 refills | Status: DC | PRN

## 2017-02-01 NOTE — Progress Notes (Signed)
BP 106/73   Pulse 68   Ht 5\' 2"  (1.575 m)   Wt 151 lb 12.8 oz (68.9 kg)   SpO2 98%   BMI 27.76 kg/m    Subjective:    Patient ID: Amanda Wang, female    DOB: 05/16/1951, 66 y.o.   MRN: [id number]  HPI: Amanda Wang is a 66 y.o. female  Chief Complaint  Patient presents with  . Follow-up  . Leg issue    Saw Apolonio Schneiders a few months ago  . Fatigue    would like Vitamin D and B12 check    Relevant past medical, surgical, family and social history reviewed and updated as indicated. Interim medical history since our last visit reviewed. Allergies and medications reviewed and updated.  Review of Systems  Constitutional: Negative.   Respiratory: Negative.   Cardiovascular: Negative.     Per HPI unless specifically indicated above     Objective:    BP 106/73   Pulse 68   Ht 5\' 2"  (1.575 m)   Wt 151 lb 12.8 oz (68.9 kg)   SpO2 98%   BMI 27.76 kg/m   Wt Readings from Last 3 Encounters:  02/01/17 151 lb 12.8 oz (68.9 kg)  11/13/16 144 lb (65.3 kg)  09/26/16 147 lb 6.4 oz (66.9 kg)    Physical Exam  Constitutional: She is oriented to person, place, and time. She appears well-developed and well-nourished. No distress.  HENT:  Head: Normocephalic and atraumatic.  Right Ear: Hearing normal.  Left Ear: Hearing normal.  Nose: Nose normal.  Eyes: Conjunctivae and lids are normal. Right eye exhibits no discharge. Left eye exhibits no discharge. No scleral icterus.  Cardiovascular: Normal rate, regular rhythm and normal heart sounds.   Pulmonary/Chest: Effort normal and breath sounds normal. No respiratory distress.  Musculoskeletal: Normal range of motion.  Mid IT tenderness duplicating patient's symptoms  Neurological: She is alert and oriented to person, place, and time.  Skin: Skin is intact. No rash noted.  Psychiatric: She has a normal mood and affect. Her speech is normal and behavior is normal. Judgment and thought content normal. Cognition and memory are  normal.    Results for orders placed or performed in visit on 07/31/16  Microscopic Examination  Result Value Ref Range   WBC, UA 0-5 0 - 5 /hpf   RBC, UA 0-2 0 - 2 /hpf   Epithelial Cells (non renal) 0-10 0 - 10 /hpf   Mucus, UA Present Not Estab.   Bacteria, UA Few None seen/Few  Urinalysis, Routine w reflex microscopic (not at [facility])  Result Value Ref Range   Specific Gravity, UA 1.015 1.005 - 1.030   pH, UA 7.0 5.0 - 7.5   Color, UA Yellow Yellow   Appearance Ur Clear Clear   Leukocytes, UA Trace (A) Negative   Protein, UA 1+ (A) Negative/Trace   Glucose, UA Negative Negative   Ketones, UA Trace (A) Negative   RBC, UA Negative Negative   Bilirubin, UA Negative Negative   Urobilinogen, Ur 1.0 0.2 - 1.0 mg/dL   Nitrite, UA Negative Negative   Microscopic Examination See below:   CBC with Differential/Platelet  Result Value Ref Range   WBC 6.4 3.4 - 10.8 x10E3/uL   RBC 4.31 3.77 - 5.28 x10E6/uL   Hemoglobin 12.2 11.1 - 15.9 g/dL   Hematocrit 37.1 34.0 - 46.6 %   MCV 86 79 - 97 fL   MCH 28.3 26.6 - 33.0 pg   MCHC  32.9 31.5 - 35.7 g/dL   RDW 15.0 12.3 - 15.4 %   Platelets 294 150 - 379 x10E3/uL   Neutrophils 71 %   Lymphs 19 %   Monocytes 7 %   Eos 2 %   Basos 1 %   Neutrophils Absolute 4.6 1.4 - 7.0 x10E3/uL   Lymphocytes Absolute 1.2 0.7 - 3.1 x10E3/uL   Monocytes Absolute 0.4 0.1 - 0.9 x10E3/uL   EOS (ABSOLUTE) 0.1 0.0 - 0.4 x10E3/uL   Basophils Absolute 0.0 0.0 - 0.2 x10E3/uL   Immature Granulocytes 0 %   Immature Grans (Abs) 0.0 0.0 - 0.1 x10E3/uL  Comprehensive metabolic panel  Result Value Ref Range   Glucose 98 65 - 99 mg/dL   BUN 12 8 - 27 mg/dL   Creatinine, Ser 0.70 0.57 - 1.00 mg/dL   GFR calc non Af Amer 93 >59 mL/min/1.73   GFR calc Af Amer 107 >59 mL/min/1.73   BUN/Creatinine Ratio 17 12 - 28   Sodium 144 134 - 144 mmol/L   Potassium 4.1 3.5 - 5.2 mmol/L   Chloride 102 96 - 106 mmol/L   CO2 27 18 - 29 mmol/L   Calcium 9.2 8.7 - 10.3 mg/dL    Total Protein 6.8 6.0 - 8.5 g/dL   Albumin 4.2 3.6 - 4.8 g/dL   Globulin, Total 2.6 1.5 - 4.5 g/dL   Albumin/Globulin Ratio 1.6 1.2 - 2.2   Bilirubin Total 0.5 0.0 - 1.2 mg/dL   Alkaline Phosphatase 99 39 - 117 IU/L   AST 15 0 - 40 IU/L   ALT 12 0 - 32 IU/L  Lipid Panel w/o Chol/HDL Ratio  Result Value Ref Range   Cholesterol, Total 154 100 - 199 mg/dL   Triglycerides 90 0 - 149 mg/dL   HDL 62 >39 mg/dL   VLDL Cholesterol Cal 18 5 - 40 mg/dL   LDL Calculated 74 0 - 99 mg/dL  TSH  Result Value Ref Range   TSH 3.250 0.450 - 4.500 uIU/mL  HIV antibody  Result Value Ref Range   HIV Screen 4th Generation wRfx Non Reactive Non Reactive  Hepatitis C Antibody  Result Value Ref Range   Hep C Virus Ab <0.1 0.0 - 0.9 s/co ratio      Assessment & Plan:   Problem List Items Addressed This Visit      Cardiovascular and Mediastinum   Hypertension    The current medical regimen is effective;  continue present plan and medications. a      Relevant Orders   Basic metabolic panel     Other   Hyperlipidemia    The current medical regimen is effective;  continue present plan and medications.       Relevant Orders   Lipid panel   AST   ALT   S/P gastric surgery    Patient hasn't had any monitoring for gastric surgery for B12 vitamin D we will do today.       Other Visit Diagnoses    Fatigue, unspecified type    -  Primary   Relevant Orders   Vitamin B12   Vitamin D 1,25 dihydroxy     Iliotibial band soreness tenderness patient education given on stretching use of cyclobenzaprine Advil and Aleve also.  Follow up plan: Return in about 6 months (around 08/01/2017) for Physical Exam.

## 2017-02-01 NOTE — Assessment & Plan Note (Signed)
Patient hasn't had any monitoring for gastric surgery for B12 vitamin D we will do today.

## 2017-02-01 NOTE — Assessment & Plan Note (Signed)
The current medical regimen is effective;  continue present plan and medications.  

## 2017-02-01 NOTE — Assessment & Plan Note (Signed)
The current medical regimen is effective;  continue present plan and medications. a 

## 2017-02-05 ENCOUNTER — Encounter: Payer: Self-pay | Admitting: Family Medicine

## 2017-02-05 LAB — BASIC METABOLIC PANEL
BUN / CREAT RATIO: 21 (ref 12–28)
BUN: 16 mg/dL (ref 8–27)
CO2: 27 mmol/L (ref 18–29)
Calcium: 9.1 mg/dL (ref 8.7–10.3)
Chloride: 99 mmol/L (ref 96–106)
Creatinine, Ser: 0.78 mg/dL (ref 0.57–1.00)
GFR, EST AFRICAN AMERICAN: 93 mL/min/{1.73_m2} (ref >59)
GFR, EST NON AFRICAN AMERICAN: 81 mL/min/{1.73_m2} (ref >59)
Glucose: 101 mg/dL — ABNORMAL HIGH (ref 65–99)
POTASSIUM: 4.4 mmol/L (ref 3.5–5.2)
SODIUM: 142 mmol/L (ref 134–144)

## 2017-02-05 LAB — VITAMIN D 1,25 DIHYDROXY
VITAMIN D 1, 25 (OH) TOTAL: 55 pg/mL
Vitamin D2 1, 25 (OH)2: <10 pg/mL
Vitamin D3 1, 25 (OH)2: 55 pg/mL

## 2017-02-05 LAB — LIPID PANEL
Chol/HDL Ratio: 2.5 ratio units (ref 0.0–4.4)
Cholesterol, Total: 163 mg/dL (ref 100–199)
HDL: 64 mg/dL (ref >39)
LDL Calculated: 82 mg/dL (ref 0–99)
Triglycerides: 85 mg/dL (ref 0–149)
VLDL Cholesterol Cal: 17 mg/dL (ref 5–40)

## 2017-02-05 LAB — VITAMIN B12: Vitamin B-12: 743 pg/mL (ref 232–1245)

## 2017-02-05 LAB — AST: AST: 15 IU/L (ref 0–40)

## 2017-02-05 LAB — ALT: ALT: 10 IU/L (ref 0–32)

## 2017-02-24 ENCOUNTER — Other Ambulatory Visit: Payer: Self-pay | Admitting: Family Medicine

## 2017-02-26 NOTE — Telephone Encounter (Signed)
Last (acute) OV: 11/13/16 Last routine OV: 02/01/17 Next OV: 08/07/17

## 2017-03-17 ENCOUNTER — Other Ambulatory Visit: Payer: Self-pay | Admitting: Family Medicine

## 2017-04-15 ENCOUNTER — Other Ambulatory Visit: Payer: Self-pay | Admitting: Family Medicine

## 2017-08-07 ENCOUNTER — Encounter: Payer: Self-pay | Admitting: Family Medicine

## 2017-08-07 ENCOUNTER — Ambulatory Visit (INDEPENDENT_AMBULATORY_CARE_PROVIDER_SITE_OTHER): Payer: BC Managed Care – PPO | Admitting: Family Medicine

## 2017-08-07 VITALS — BP 114/75 | HR 62 | Ht 62.0 in | Wt 157.0 lb

## 2017-08-07 DIAGNOSIS — Z131 Encounter for screening for diabetes mellitus: Secondary | ICD-10-CM

## 2017-08-07 DIAGNOSIS — F3342 Major depressive disorder, recurrent, in full remission: Secondary | ICD-10-CM

## 2017-08-07 DIAGNOSIS — G935 Compression of brain: Secondary | ICD-10-CM

## 2017-08-07 DIAGNOSIS — E7800 Pure hypercholesterolemia, unspecified: Secondary | ICD-10-CM | POA: Diagnosis not present

## 2017-08-07 DIAGNOSIS — F419 Anxiety disorder, unspecified: Secondary | ICD-10-CM

## 2017-08-07 DIAGNOSIS — E785 Hyperlipidemia, unspecified: Secondary | ICD-10-CM

## 2017-08-07 DIAGNOSIS — Z0000 Encounter for general adult medical examination without abnormal findings: Secondary | ICD-10-CM | POA: Diagnosis not present

## 2017-08-07 DIAGNOSIS — I10 Essential (primary) hypertension: Secondary | ICD-10-CM

## 2017-08-07 DIAGNOSIS — Z1329 Encounter for screening for other suspected endocrine disorder: Secondary | ICD-10-CM

## 2017-08-07 DIAGNOSIS — Z8669 Personal history of other diseases of the nervous system and sense organs: Secondary | ICD-10-CM | POA: Diagnosis not present

## 2017-08-07 DIAGNOSIS — G4730 Sleep apnea, unspecified: Secondary | ICD-10-CM

## 2017-08-07 LAB — URINALYSIS, ROUTINE W REFLEX MICROSCOPIC
Bilirubin, UA: NEGATIVE
Glucose, UA: NEGATIVE
KETONES UA: NEGATIVE
LEUKOCYTES UA: NEGATIVE
Nitrite, UA: NEGATIVE
Protein, UA: NEGATIVE
RBC, UA: NEGATIVE
Urobilinogen, Ur: 0.2 mg/dL (ref 0.2–1.0)
pH, UA: 5 (ref 5.0–7.5)

## 2017-08-07 LAB — MICROSCOPIC EXAMINATION: Bacteria, UA: NONE SEEN

## 2017-08-07 MED ORDER — LORAZEPAM 1 MG PO TABS: 0.5000 mg | ORAL_TABLET | Freq: Every day | ORAL | 0 refills | Status: DC | PRN

## 2017-08-07 MED ORDER — AMLODIPINE BESYLATE 5 MG PO TABS: 5.0000 mg | ORAL_TABLET | Freq: Every day | ORAL | 4 refills | Status: DC

## 2017-08-07 MED ORDER — BENAZEPRIL-HYDROCHLOROTHIAZIDE 20-25 MG PO TABS: 1.0000 | ORAL_TABLET | Freq: Every day | ORAL | 4 refills | Status: DC

## 2017-08-07 MED ORDER — LEVETIRACETAM 500 MG PO TABS: 500.0000 mg | ORAL_TABLET | Freq: Every day | ORAL | 4 refills | Status: DC

## 2017-08-07 MED ORDER — ATORVASTATIN CALCIUM 40 MG PO TABS: 40.0000 mg | ORAL_TABLET | Freq: Every day | ORAL | 4 refills | Status: DC

## 2017-08-07 MED ORDER — VIIBRYD 20 MG PO TABS: 20.0000 mg | ORAL_TABLET | Freq: Every day | ORAL | 4 refills | Status: DC

## 2017-08-07 NOTE — Assessment & Plan Note (Signed)
Discuss anxiety lorazepam 30 tablets has lasted over a year will give fresh refill uses only occasionally when she can't sleep.

## 2017-08-07 NOTE — Assessment & Plan Note (Signed)
The current medical regimen is effective;  continue present plan and medications.  

## 2017-08-07 NOTE — Assessment & Plan Note (Addendum)
Only intermittent use now discussed full-time use and importance Review of sleep study shows oxygen levels dropped to 84% overnight.

## 2017-08-07 NOTE — Assessment & Plan Note (Signed)
Has had surgery and now takes Keppra to prevent muscle spasms which does a good job.

## 2017-08-07 NOTE — Progress Notes (Signed)
BP 114/75   Pulse 62   Ht 5\' 2"  (1.575 m)   Wt 157 lb (71.2 kg)   SpO2 98%   BMI 28.72 kg/m    Subjective:    Patient ID: Amanda Wang, female    DOB: June 18, 1951, 66 y.o.   MRN: [id number]  HPI: Amanda Wang is a 66 y.o. female  Chief Complaint  Patient presents with  . Annual Exam   Patient follow-up all in all doing well not really using her CPAP reviewed previous CPAP and patient's oxygen levels dropped to 84 overnight Takes blood pressure cholesterol medicine faithfully without problems Anxiety medication takes rarely for sleepless nights but otherwise does well.  Relevant past medical, surgical, family and social history reviewed and updated as indicated. Interim medical history since our last visit reviewed. Allergies and medications reviewed and updated.  Review of Systems  Constitutional: Negative.   HENT: Negative.   Eyes: Negative.   Respiratory: Negative.   Cardiovascular: Negative.   Gastrointestinal: Negative.   Endocrine: Negative.   Genitourinary: Negative.   Musculoskeletal: Negative.   Skin: Negative.   Allergic/Immunologic: Negative.   Neurological: Negative.   Hematological: Negative.   Psychiatric/Behavioral: Negative.     Per HPI unless specifically indicated above     Objective:    BP 114/75   Pulse 62   Ht 5\' 2"  (1.575 m)   Wt 157 lb (71.2 kg)   SpO2 98%   BMI 28.72 kg/m   Wt Readings from Last 3 Encounters:  08/07/17 157 lb (71.2 kg)  02/01/17 151 lb 12.8 oz (68.9 kg)  11/13/16 144 lb (65.3 kg)    Physical Exam  Constitutional: She is oriented to person, place, and time. She appears well-developed and well-nourished.  HENT:  Head: Normocephalic and atraumatic.  Right Ear: External ear normal.  Left Ear: External ear normal.  Nose: Nose normal.  Mouth/Throat: Oropharynx is clear and moist.  Eyes: Pupils are equal, round, and reactive to light. Conjunctivae and EOM are normal.  Neck: Normal range of motion. Neck  supple. Carotid bruit is not present.  Cardiovascular: Normal rate, regular rhythm and normal heart sounds.   No murmur heard. Pulmonary/Chest: Effort normal and breath sounds normal. She exhibits no mass. Right breast exhibits no mass, no skin change and no tenderness. Left breast exhibits no mass, no skin change and no tenderness. Breasts are symmetrical.  Abdominal: Soft. Bowel sounds are normal. There is no hepatosplenomegaly.  Musculoskeletal: Normal range of motion.  Neurological: She is alert and oriented to person, place, and time.  Skin: No rash noted.  Psychiatric: She has a normal mood and affect. Her behavior is normal. Judgment and thought content normal.    Results for orders placed or performed in visit on 02/01/17  Vitamin B12  Result Value Ref Range   Vitamin B-12 743 232 - 1,245 pg/mL  Vitamin D 1,25 dihydroxy  Result Value Ref Range   Vitamin D 1, 25 (OH)2 Total 55 pg/mL   Vitamin D2 1, 25 (OH)2 <10 pg/mL   Vitamin D3 1, 25 (OH)2 55 pg/mL  Lipid panel  Result Value Ref Range   Cholesterol, Total 163 100 - 199 mg/dL   Triglycerides 85 0 - 149 mg/dL   HDL 64 >39 mg/dL   VLDL Cholesterol Cal 17 5 - 40 mg/dL   LDL Calculated 82 0 - 99 mg/dL   Chol/HDL Ratio 2.5 0.0 - 4.4 ratio units  Basic metabolic panel  Result Value Ref Range  Glucose 101 (H) 65 - 99 mg/dL   BUN 16 8 - 27 mg/dL   Creatinine, Ser 0.78 0.57 - 1.00 mg/dL   GFR calc non Af Amer 81 >59 mL/min/1.73   GFR calc Af Amer 93 >59 mL/min/1.73   BUN/Creatinine Ratio 21 12 - 28   Sodium 142 134 - 144 mmol/L   Potassium 4.4 3.5 - 5.2 mmol/L   Chloride 99 96 - 106 mmol/L   CO2 27 18 - 29 mmol/L   Calcium 9.1 8.7 - 10.3 mg/dL  AST  Result Value Ref Range   AST 15 0 - 40 IU/L  ALT  Result Value Ref Range   ALT 10 0 - 32 IU/L      Assessment & Plan:   Problem List Items Addressed This Visit      Cardiovascular and Mediastinum   Hypertension - Primary    The current medical regimen is effective;   continue present plan and medications.       Relevant Medications   amLODipine (NORVASC) 5 MG tablet   atorvastatin (LIPITOR) 40 MG tablet   benazepril-hydrochlorthiazide (LOTENSIN HCT) 20-25 MG tablet   Other Relevant Orders   CBC with Differential/Platelet     Respiratory   Sleep apnea    Only intermittent use now discussed full-time use and importance Review of sleep study shows oxygen levels dropped to 84% overnight.        Nervous and Auditory   Chiari malformation type I ([place])    Has had surgery and now takes Keppra to prevent muscle spasms which does a good job.      Relevant Medications   levETIRAcetam (KEPPRA) 500 MG tablet     Other   Hyperlipidemia    The current medical regimen is effective;  continue present plan and medications.       Relevant Medications   amLODipine (NORVASC) 5 MG tablet   atorvastatin (LIPITOR) 40 MG tablet   benazepril-hydrochlorthiazide (LOTENSIN HCT) 20-25 MG tablet   Other Relevant Orders   Lipid panel   Depression   Relevant Medications   VIIBRYD 20 MG TABS   LORazepam (ATIVAN) 1 MG tablet   Anxiety    Discuss anxiety lorazepam 30 tablets has lasted over a year will give fresh refill uses only occasionally when she can't sleep.      Relevant Medications   VIIBRYD 20 MG TABS   LORazepam (ATIVAN) 1 MG tablet    Other Visit Diagnoses    Annual physical exam       Screening for diabetes mellitus (DM)       Relevant Orders   Comprehensive metabolic panel   Urinalysis, Routine w reflex microscopic   Thyroid disorder screen       Relevant Orders   TSH       Follow up plan: Return in about 6 months (around 02/07/2018) for BMP,  Lipids, ALT, AST.

## 2017-08-08 ENCOUNTER — Encounter: Payer: Self-pay | Admitting: Family Medicine

## 2017-08-08 LAB — COMPREHENSIVE METABOLIC PANEL
ALT: 12 IU/L (ref 0–32)
AST: 19 IU/L (ref 0–40)
Albumin/Globulin Ratio: 1.6 (ref 1.2–2.2)
Albumin: 4.4 g/dL (ref 3.6–4.8)
Alkaline Phosphatase: 108 IU/L (ref 39–117)
BUN/Creatinine Ratio: 23 (ref 12–28)
BUN: 21 mg/dL (ref 8–27)
Bilirubin Total: 0.3 mg/dL (ref 0.0–1.2)
CALCIUM: 9.4 mg/dL (ref 8.7–10.3)
CO2: 27 mmol/L (ref 20–29)
CREATININE: 0.92 mg/dL (ref 0.57–1.00)
Chloride: 101 mmol/L (ref 96–106)
GFR calc Af Amer: 76 mL/min/{1.73_m2} (ref >59)
GFR, EST NON AFRICAN AMERICAN: 66 mL/min/{1.73_m2} (ref >59)
GLOBULIN, TOTAL: 2.8 g/dL (ref 1.5–4.5)
Glucose: 76 mg/dL (ref 65–99)
Potassium: 4.5 mmol/L (ref 3.5–5.2)
SODIUM: 143 mmol/L (ref 134–144)
TOTAL PROTEIN: 7.2 g/dL (ref 6.0–8.5)

## 2017-08-08 LAB — CBC WITH DIFFERENTIAL/PLATELET
Basophils Absolute: 0 10*3/uL (ref 0.0–0.2)
Basos: 1 %
EOS (ABSOLUTE): 0.1 10*3/uL (ref 0.0–0.4)
EOS: 2 %
HEMATOCRIT: 35.3 % (ref 34.0–46.6)
HEMOGLOBIN: 11.4 g/dL (ref 11.1–15.9)
IMMATURE GRANS (ABS): 0 10*3/uL (ref 0.0–0.1)
Immature Granulocytes: 0 %
LYMPHS ABS: 1.2 10*3/uL (ref 0.7–3.1)
Lymphs: 21 %
MCH: 28.4 pg (ref 26.6–33.0)
MCHC: 32.3 g/dL (ref 31.5–35.7)
MCV: 88 fL (ref 79–97)
Monocytes Absolute: 0.5 10*3/uL (ref 0.1–0.9)
Monocytes: 8 %
NEUTROS PCT: 68 %
Neutrophils Absolute: 4.1 10*3/uL (ref 1.4–7.0)
Platelets: 285 10*3/uL (ref 150–379)
RBC: 4.01 x10E6/uL (ref 3.77–5.28)
RDW: 14.6 % (ref 12.3–15.4)
WBC: 5.9 10*3/uL (ref 3.4–10.8)

## 2017-08-08 LAB — LIPID PANEL
CHOL/HDL RATIO: 2.6 ratio (ref 0.0–4.4)
Cholesterol, Total: 172 mg/dL (ref 100–199)
HDL: 65 mg/dL (ref >39)
LDL CALC: 77 mg/dL (ref 0–99)
Triglycerides: 149 mg/dL (ref 0–149)
VLDL CHOLESTEROL CAL: 30 mg/dL (ref 5–40)

## 2017-08-08 LAB — TSH: TSH: 1.7 u[IU]/mL (ref 0.450–4.500)

## 2017-09-26 ENCOUNTER — Other Ambulatory Visit: Payer: Self-pay | Admitting: Family Medicine

## 2017-09-26 MED ORDER — CYCLOBENZAPRINE HCL 10 MG PO TABS: ORAL_TABLET | ORAL | 1 refills | Status: DC

## 2017-09-26 NOTE — Telephone Encounter (Signed)
Patient needs refill on her flexeril 10mg  for muscle spasms in legs.  Send the script to [facility] in [place]

## 2017-10-26 DIAGNOSIS — I152 Hypertension secondary to endocrine disorders: Secondary | ICD-10-CM | POA: Insufficient documentation

## 2017-10-26 DIAGNOSIS — E7849 Other hyperlipidemia: Secondary | ICD-10-CM | POA: Insufficient documentation

## 2017-10-26 DIAGNOSIS — E1169 Type 2 diabetes mellitus with other specified complication: Secondary | ICD-10-CM | POA: Insufficient documentation

## 2017-10-26 DIAGNOSIS — E1159 Type 2 diabetes mellitus with other circulatory complications: Secondary | ICD-10-CM | POA: Insufficient documentation

## 2017-10-26 DIAGNOSIS — I10 Essential (primary) hypertension: Secondary | ICD-10-CM | POA: Insufficient documentation

## 2017-10-26 DIAGNOSIS — K219 Gastro-esophageal reflux disease without esophagitis: Secondary | ICD-10-CM | POA: Insufficient documentation

## 2017-10-26 DIAGNOSIS — E785 Hyperlipidemia, unspecified: Secondary | ICD-10-CM | POA: Insufficient documentation

## 2017-11-01 ENCOUNTER — Telehealth: Payer: Self-pay | Admitting: Family Medicine

## 2017-11-01 NOTE — Telephone Encounter (Signed)
Copied from [place] [id number] [id number]. Topic: Inquiry >> Nov 01, 2017  3:46 PM Cecelia Byars, [place] wrote: Reason for CRM: Patient would like a prescription for cold sores called in ,at [facility] in [place], [phone]

## 2017-11-01 NOTE — Telephone Encounter (Signed)
Pt requesting prescription for cold sores

## 2017-11-01 NOTE — Telephone Encounter (Signed)
Copied from [place] [id number]. Topic: Inquiry >> Nov 01, 2017  3:46 PM Amanda Wang, [place] wrote: Reason for CRM: Patient would like a prescription for cold sores called in ,at [facility] in [place], [phone]

## 2017-11-02 MED ORDER — VALACYCLOVIR HCL 1 G PO TABS: 1000.0000 mg | ORAL_TABLET | Freq: Two times a day (BID) | ORAL | 12 refills | Status: DC

## 2017-11-02 NOTE — Telephone Encounter (Signed)
rx sent

## 2017-11-23 ENCOUNTER — Ambulatory Visit: Payer: BC Managed Care – PPO | Admitting: Family Medicine

## 2017-11-23 ENCOUNTER — Encounter: Payer: Self-pay | Admitting: Family Medicine

## 2017-11-23 VITALS — BP 129/78 | HR 77 | Wt 157.0 lb

## 2017-11-23 DIAGNOSIS — B372 Candidiasis of skin and nail: Secondary | ICD-10-CM | POA: Diagnosis not present

## 2017-11-23 MED ORDER — NYSTATIN-TRIAMCINOLONE 100000-0.1 UNIT/GM-% EX OINT: 1.0000 "application " | TOPICAL_OINTMENT | Freq: Two times a day (BID) | CUTANEOUS | 0 refills | Status: DC

## 2017-11-23 MED ORDER — NYSTATIN 100000 UNIT/GM EX POWD: Freq: Four times a day (QID) | CUTANEOUS | 0 refills | Status: DC

## 2017-11-23 NOTE — Progress Notes (Signed)
   BP 129/78   Pulse 77   Wt 157 lb (71.2 kg)   SpO2 96%   BMI 28.72 kg/m    Subjective:    Patient ID: Amanda Wang, female    DOB: April 06, 1951, 66 y.o.   MRN: [id number]  HPI: Amanda Wang is a 66 y.o. female  Chief Complaint  Patient presents with  . Rash    Underboob, itches, x 2 weeks   Patient presents with 2 weeks of erythematous, pruritic rash under right breast. This has never happened to her before. Has tried lotrimin cream on it with no relief. Denies peeling, bleeding, burning in area. No new lotions or soaps.   Relevant past medical, surgical, family and social history reviewed and updated as indicated. Interim medical history since our last visit reviewed. Allergies and medications reviewed and updated.  Review of Systems  Constitutional: Negative.   Respiratory: Negative.   Cardiovascular: Negative.   Gastrointestinal: Negative.   Musculoskeletal: Negative.   Skin: Positive for rash.  Psychiatric/Behavioral: Negative.     Per HPI unless specifically indicated above     Objective:    BP 129/78   Pulse 77   Wt 157 lb (71.2 kg)   SpO2 96%   BMI 28.72 kg/m   Wt Readings from Last 3 Encounters:  11/23/17 157 lb (71.2 kg)  08/07/17 157 lb (71.2 kg)  02/01/17 151 lb 12.8 oz (68.9 kg)    Physical Exam  Constitutional: She is oriented to person, place, and time. She appears well-developed and well-nourished. No distress.  HENT:  Head: Atraumatic.  Eyes: Conjunctivae are normal. Pupils are equal, round, and reactive to light. No scleral icterus.  Neck: Neck supple.  Cardiovascular: Normal rate and normal heart sounds.  Pulmonary/Chest: Effort normal. No respiratory distress.  Musculoskeletal: Normal range of motion.  Lymphadenopathy:    She has no cervical adenopathy.  Neurological: She is alert and oriented to person, place, and time.  Skin: Skin is warm and dry. Rash (erythematous, slightly macerated rash under right breast) noted.    Psychiatric: She has a normal mood and affect. Her behavior is normal.  Nursing note and vitals reviewed.     Assessment & Plan:   Problem List Items Addressed This Visit    None    Visit Diagnoses    Cutaneous candidiasis    -  Primary   Nystatin cream and powder sent, keep area clean and dry. F/u if worsening or no improvement   Relevant Medications   nystatin-triamcinolone ointment (MYCOLOG)   nystatin (MYCOSTATIN/NYSTOP) powder       Follow up plan: Return if symptoms worsen or fail to improve.

## 2017-11-23 NOTE — Patient Instructions (Signed)
Follow up as needed

## 2017-12-17 ENCOUNTER — Encounter: Payer: Self-pay | Admitting: Family Medicine

## 2018-01-01 DIAGNOSIS — F411 Generalized anxiety disorder: Secondary | ICD-10-CM | POA: Diagnosis not present

## 2018-01-14 ENCOUNTER — Other Ambulatory Visit: Payer: Self-pay | Admitting: Family Medicine

## 2018-01-14 MED ORDER — CYCLOBENZAPRINE HCL 10 MG PO TABS: ORAL_TABLET | ORAL | 1 refills | Status: DC

## 2018-01-14 NOTE — Telephone Encounter (Signed)
Copied from [place] [id number]. Topic: Quick Communication - Rx Refill/Question >> Jan 14, 2018 10:37 AM Cleaster Corin, [place] wrote: Medication: flexeril 10mg    Has the patient contacted their pharmacy?no   (Agent: If no, request that the patient contact the pharmacy for the refill.)   Preferred Pharmacy (with phone number or street name): [facility], [address] [place] [address] Phone: [phone] Fax: [phone]    Agent: Please be advised that RX refills may take up to 3 business days. We ask that you follow-up with your pharmacy.

## 2018-01-21 DIAGNOSIS — F411 Generalized anxiety disorder: Secondary | ICD-10-CM | POA: Diagnosis not present

## 2018-02-04 DIAGNOSIS — F411 Generalized anxiety disorder: Secondary | ICD-10-CM | POA: Diagnosis not present

## 2018-02-11 ENCOUNTER — Ambulatory Visit: Payer: BC Managed Care – PPO | Admitting: Family Medicine

## 2018-02-25 DIAGNOSIS — F411 Generalized anxiety disorder: Secondary | ICD-10-CM | POA: Diagnosis not present

## 2018-03-04 DIAGNOSIS — F411 Generalized anxiety disorder: Secondary | ICD-10-CM | POA: Diagnosis not present

## 2018-03-07 ENCOUNTER — Ambulatory Visit (INDEPENDENT_AMBULATORY_CARE_PROVIDER_SITE_OTHER): Payer: Medicare Other | Admitting: Family Medicine

## 2018-03-07 ENCOUNTER — Encounter: Payer: Self-pay | Admitting: Family Medicine

## 2018-03-07 VITALS — BP 123/77 | HR 51 | Wt 150.3 lb

## 2018-03-07 DIAGNOSIS — E7800 Pure hypercholesterolemia, unspecified: Secondary | ICD-10-CM

## 2018-03-07 DIAGNOSIS — G2581 Restless legs syndrome: Secondary | ICD-10-CM

## 2018-03-07 DIAGNOSIS — F3342 Major depressive disorder, recurrent, in full remission: Secondary | ICD-10-CM | POA: Diagnosis not present

## 2018-03-07 DIAGNOSIS — I10 Essential (primary) hypertension: Secondary | ICD-10-CM

## 2018-03-07 MED ORDER — BENAZEPRIL-HYDROCHLOROTHIAZIDE 20-25 MG PO TABS
1.0000 | ORAL_TABLET | Freq: Every day | ORAL | 3 refills | Status: DC
Start: 2018-03-07 — End: 2018-09-16

## 2018-03-07 MED ORDER — VILAZODONE HCL 10 MG PO TABS: 10.0000 mg | ORAL_TABLET | Freq: Every day | ORAL | 6 refills | Status: DC

## 2018-03-07 MED ORDER — GABAPENTIN 300 MG PO CAPS: 300.0000 mg | ORAL_CAPSULE | Freq: Three times a day (TID) | ORAL | 3 refills | Status: DC

## 2018-03-07 NOTE — Assessment & Plan Note (Signed)
Discussed viibrid and pt wants to reduce dose to 10mg 

## 2018-03-07 NOTE — Assessment & Plan Note (Signed)
The current medical regimen is effective;  continue present plan and medications.  

## 2018-03-07 NOTE — Assessment & Plan Note (Signed)
Discussed restless leg syndrome and gabapentin will start 300 mg may increase to 1200 mg

## 2018-03-07 NOTE — Progress Notes (Signed)
BP 123/77   Pulse (!) 51   Wt 150 lb 4.8 oz (68.2 kg)   SpO2 99%   BMI 27.49 kg/m    Subjective:    Patient ID: Amanda Wang, female    DOB: 02/05/1951, 67 y.o.   MRN: [id number]  HPI: Amanda Wang is a 67 y.o. female  Chief Complaint  Patient presents with  . Follow-up  . Hypertension   Follow-up hypertension doing well with hypertension no complaints good control of blood pressure.  Patient has retired from her job and doing well with significantly less stress.  Patient those since the new year has been having several times a week substernal chest pain coming on after eating lasting 10 minutes or so not associated with activity.  Has been able to be very physically active without any associated chest symptoms. Patient is taking Prilosec with no real change in symptoms. Has tried some liquid antacids with no real relief. Over the last 3-4 days has tried simethicone which is had a significant improvement with no further chest pain symptoms and gas symptoms have improved significantly.  Patient though previously has been able to go 3 or 4 days with no chest pain with above symptoms. Also with restless leg syndrome wants to try something for restless legs discussed risks benefits  Relevant past medical, surgical, family and social history reviewed and updated as indicated. Interim medical history since our last visit reviewed. Allergies and medications reviewed and updated.  Review of Systems  Constitutional: Negative.   Respiratory: Negative.   Cardiovascular: Negative.     Per HPI unless specifically indicated above     Objective:    BP 123/77   Pulse (!) 51   Wt 150 lb 4.8 oz (68.2 kg)   SpO2 99%   BMI 27.49 kg/m   Wt Readings from Last 3 Encounters:  03/07/18 150 lb 4.8 oz (68.2 kg)  11/23/17 157 lb (71.2 kg)  08/07/17 157 lb (71.2 kg)    Physical Exam  Constitutional: She is oriented to person, place, and time. She appears well-developed and  well-nourished.  HENT:  Head: Normocephalic and atraumatic.  Eyes: Conjunctivae and EOM are normal.  Neck: Normal range of motion.  Cardiovascular: Normal rate, regular rhythm and normal heart sounds.  Pulmonary/Chest: Effort normal and breath sounds normal.  Musculoskeletal: Normal range of motion.  Neurological: She is alert and oriented to person, place, and time.  Skin: No erythema.  Psychiatric: She has a normal mood and affect. Her behavior is normal. Judgment and thought content normal.    Results for orders placed or performed in visit on 08/07/17  Microscopic Examination  Result Value Ref Range   WBC, UA 0-5 0 - 5 /hpf   RBC, UA 0-2 0 - 2 /hpf   Epithelial Cells (non renal) 0-10 0 - 10 /hpf   Bacteria, UA None seen None seen/Few  CBC with Differential/Platelet  Result Value Ref Range   WBC 5.9 3.4 - 10.8 x10E3/uL   RBC 4.01 3.77 - 5.28 x10E6/uL   Hemoglobin 11.4 11.1 - 15.9 g/dL   Hematocrit 35.3 34.0 - 46.6 %   MCV 88 79 - 97 fL   MCH 28.4 26.6 - 33.0 pg   MCHC 32.3 31.5 - 35.7 g/dL   RDW 14.6 12.3 - 15.4 %   Platelets 285 150 - 379 x10E3/uL   Neutrophils 68 Not Estab. %   Lymphs 21 Not Estab. %   Monocytes 8 Not Estab. %  Eos 2 Not Estab. %   Basos 1 Not Estab. %   Neutrophils Absolute 4.1 1.4 - 7.0 x10E3/uL   Lymphocytes Absolute 1.2 0.7 - 3.1 x10E3/uL   Monocytes Absolute 0.5 0.1 - 0.9 x10E3/uL   EOS (ABSOLUTE) 0.1 0.0 - 0.4 x10E3/uL   Basophils Absolute 0.0 0.0 - 0.2 x10E3/uL   Immature Granulocytes 0 Not Estab. %   Immature Grans (Abs) 0.0 0.0 - 0.1 x10E3/uL  Comprehensive metabolic panel  Result Value Ref Range   Glucose 76 65 - 99 mg/dL   BUN 21 8 - 27 mg/dL   Creatinine, Ser 0.92 0.57 - 1.00 mg/dL   GFR calc non Af Amer 66 >59 mL/min/1.73   GFR calc Af Amer 76 >59 mL/min/1.73   BUN/Creatinine Ratio 23 12 - 28   Sodium 143 134 - 144 mmol/L   Potassium 4.5 3.5 - 5.2 mmol/L   Chloride 101 96 - 106 mmol/L   CO2 27 20 - 29 mmol/L   Calcium 9.4 8.7  - 10.3 mg/dL   Total Protein 7.2 6.0 - 8.5 g/dL   Albumin 4.4 3.6 - 4.8 g/dL   Globulin, Total 2.8 1.5 - 4.5 g/dL   Albumin/Globulin Ratio 1.6 1.2 - 2.2   Bilirubin Total 0.3 0.0 - 1.2 mg/dL   Alkaline Phosphatase 108 39 - 117 IU/L   AST 19 0 - 40 IU/L   ALT 12 0 - 32 IU/L  Lipid panel  Result Value Ref Range   Cholesterol, Total 172 100 - 199 mg/dL   Triglycerides 149 0 - 149 mg/dL   HDL 65 >39 mg/dL   VLDL Cholesterol Cal 30 5 - 40 mg/dL   LDL Calculated 77 0 - 99 mg/dL   Chol/HDL Ratio 2.6 0.0 - 4.4 ratio  TSH  Result Value Ref Range   TSH 1.700 0.450 - 4.500 uIU/mL  Urinalysis, Routine w reflex microscopic  Result Value Ref Range   Specific Gravity, UA >1.030 (H) 1.005 - 1.030   pH, UA 5.0 5.0 - 7.5   Color, UA Yellow Yellow   Appearance Ur Clear Clear   Leukocytes, UA Negative Negative   Protein, UA Negative Negative/Trace   Glucose, UA Negative Negative   Ketones, UA Negative Negative   RBC, UA Negative Negative   Bilirubin, UA Negative Negative   Urobilinogen, Ur 0.2 0.2 - 1.0 mg/dL   Nitrite, UA Negative Negative   Microscopic Examination See below:       Assessment & Plan:   Problem List Items Addressed This Visit      Cardiovascular and Mediastinum   Hypertension - Primary    The current medical regimen is effective;  continue present plan and medications.       Relevant Medications   benazepril-hydrochlorthiazide (LOTENSIN HCT) 20-25 MG tablet   Other Relevant Orders   Basic metabolic panel   LP+ALT+AST Piccolo, Waived     Other   Hyperlipidemia    The current medical regimen is effective;  continue present plan and medications.       Relevant Medications   benazepril-hydrochlorthiazide (LOTENSIN HCT) 20-25 MG tablet   Other Relevant Orders   Basic metabolic panel   LP+ALT+AST Piccolo, Waived   Depression    Discussed viibrid and pt wants to reduce dose to 10mg       Relevant Medications   Vilazodone HCl (VIIBRYD) 10 MG TABS   Restless  legs    Discussed restless leg syndrome and gabapentin will start 300 mg may increase to 1200  mg          Follow up plan: Return in about 6 months (around 09/07/2018) for Physical Exam.

## 2018-03-08 LAB — BASIC METABOLIC PANEL
BUN/Creatinine Ratio: 26 (ref 12–28)
BUN: 18 mg/dL (ref 8–27)
CALCIUM: 9.3 mg/dL (ref 8.7–10.3)
CO2: 25 mmol/L (ref 20–29)
CREATININE: 0.69 mg/dL (ref 0.57–1.00)
Chloride: 100 mmol/L (ref 96–106)
GFR calc Af Amer: 106 mL/min/{1.73_m2} (ref >59)
GFR, EST NON AFRICAN AMERICAN: 92 mL/min/{1.73_m2} (ref >59)
GLUCOSE: 84 mg/dL (ref 65–99)
Potassium: 4.3 mmol/L (ref 3.5–5.2)
Sodium: 141 mmol/L (ref 134–144)

## 2018-03-08 LAB — LP+ALT+AST PICCOLO, WAIVED
ALT (SGPT) PICCOLO, WAIVED: 23 U/L (ref 10–47)
AST (SGOT) PICCOLO, WAIVED: 26 U/L (ref 11–38)
CHOL/HDL RATIO PICCOLO,WAIVE: 2.6 mg/dL
CHOLESTEROL PICCOLO, WAIVED: 175 mg/dL (ref <200)
HDL Chol Piccolo, Waived: 68 mg/dL (ref >59)
LDL Chol Calc Piccolo Waived: 74 mg/dL (ref <100)
TRIGLYCERIDES PICCOLO,WAIVED: 164 mg/dL — AB (ref <150)
VLDL Chol Calc Piccolo,Waive: 33 mg/dL — ABNORMAL HIGH (ref <30)

## 2018-03-11 ENCOUNTER — Encounter: Payer: Self-pay | Admitting: Family Medicine

## 2018-03-11 DIAGNOSIS — F411 Generalized anxiety disorder: Secondary | ICD-10-CM | POA: Diagnosis not present

## 2018-03-18 DIAGNOSIS — F411 Generalized anxiety disorder: Secondary | ICD-10-CM | POA: Diagnosis not present

## 2018-03-22 ENCOUNTER — Ambulatory Visit (INDEPENDENT_AMBULATORY_CARE_PROVIDER_SITE_OTHER): Payer: Medicare Other | Admitting: Family Medicine

## 2018-03-22 ENCOUNTER — Encounter: Payer: Self-pay | Admitting: Family Medicine

## 2018-03-22 VITALS — BP 112/67 | HR 90 | Temp 98.2°F | Wt 154.5 lb

## 2018-03-22 DIAGNOSIS — L609 Nail disorder, unspecified: Secondary | ICD-10-CM

## 2018-03-22 DIAGNOSIS — L608 Other nail disorders: Secondary | ICD-10-CM | POA: Diagnosis not present

## 2018-03-22 MED ORDER — MUPIROCIN 2 % EX OINT: TOPICAL_OINTMENT | CUTANEOUS | 0 refills | Status: DC

## 2018-03-22 MED ORDER — CLINDAMYCIN HCL 150 MG PO CAPS: 150.0000 mg | ORAL_CAPSULE | Freq: Two times a day (BID) | ORAL | 0 refills | Status: DC

## 2018-03-22 NOTE — Progress Notes (Signed)
BP 112/67 (BP Location: Right Arm, Patient Position: Sitting, Cuff Size: Normal)   Pulse 90   Temp 98.2 F (36.8 C) (Oral)   Wt 154 lb 8 oz (70.1 kg)   SpO2 100%   BMI 28.26 kg/m    Subjective:    Patient ID: Amanda Wang, female    DOB: 12/06/1951, 67 y.o.   MRN: [id number]  HPI: Amanda Wang is a 67 y.o. female  Chief Complaint  Patient presents with  . Nail Problem    Patient went and got nails done a few weeks ago. Took nails off last week and noticed green under nails. Left ring finger is the worst. Painful, throbbing and nail has split down the middle.   Pt here with nail pain, cracking, and green discoloration since getting a manicure a few weeks ago. Started noticing discoloration under false nails last week so soaked them in acetone and removed the false nails. Now having cracking, pain, and left 4th digit nail is split all the way down to cuticle with fingertip redness and swelling starting. Significantly painful in this finger. Has been soaking hands and using neosporin since yesterday when it started hurting. Denies fevers, chills, nail thickening, thick drainage.   Relevant past medical, surgical, family and social history reviewed and updated as indicated. Interim medical history since our last visit reviewed. Allergies and medications reviewed and updated.  Review of Systems  Per HPI unless specifically indicated above     Objective:    BP 112/67 (BP Location: Right Arm, Patient Position: Sitting, Cuff Size: Normal)   Pulse 90   Temp 98.2 F (36.8 C) (Oral)   Wt 154 lb 8 oz (70.1 kg)   SpO2 100%   BMI 28.26 kg/m   Wt Readings from Last 3 Encounters:  03/22/18 154 lb 8 oz (70.1 kg)  03/07/18 150 lb 4.8 oz (68.2 kg)  11/23/17 157 lb (71.2 kg)    Physical Exam  Constitutional: She is oriented to person, place, and time. She appears well-developed and well-nourished. No distress.  HENT:  Head: Atraumatic.  Eyes: Pupils are equal, round, and  reactive to light. Conjunctivae are normal.  Neck: Normal range of motion. Neck supple.  Cardiovascular: Normal rate and normal heart sounds.  Pulmonary/Chest: Effort normal. No respiratory distress.  Musculoskeletal: Normal range of motion. She exhibits edema and tenderness.  Mild edema, significant ttp at distal end of left 4th digit  Neurological: She is alert and oriented to person, place, and time.  Skin: Skin is warm and dry. There is erythema (left 4th digit distally).  Nails thin, chipped diffusely. Several nails with some dark green discoloration underneath nail. Left 4th digit nail split down to cuticle with infection present  Psychiatric: She has a normal mood and affect. Her behavior is normal.  Nursing note and vitals reviewed.   Results for orders placed or performed in visit on 27/06/23  Basic metabolic panel  Result Value Ref Range   Glucose 84 65 - 99 mg/dL   BUN 18 8 - 27 mg/dL   Creatinine, Ser 0.69 0.57 - 1.00 mg/dL   GFR calc non Af Amer 92 >59 mL/min/1.73   GFR calc Af Amer 106 >59 mL/min/1.73   BUN/Creatinine Ratio 26 12 - 28   Sodium 141 134 - 144 mmol/L   Potassium 4.3 3.5 - 5.2 mmol/L   Chloride 100 96 - 106 mmol/L   CO2 25 20 - 29 mmol/L   Calcium 9.3 8.7 - 10.3  mg/dL  LP+ALT+AST Piccolo, [employer]  Result Value Ref Range   ALT (SGPT) Piccolo, Waived 23 10 - 47 U/L   AST (SGOT) Piccolo, Waived 26 11 - 38 U/L   Cholesterol Piccolo, Waived 175 <200 mg/dL   HDL Chol Piccolo, Waived 68 >59 mg/dL   Triglycerides Piccolo,Waived 164 (H) <150 mg/dL   Chol/HDL Ratio Piccolo,Waive 2.6 mg/dL   LDL Chol Calc Piccolo Waived 74 <100 mg/dL   VLDL Chol Calc Piccolo,Waive 33 (H) <30 mg/dL      Assessment & Plan:   Problem List Items Addressed This Visit    None    Visit Diagnoses    Nail abnormality    -  Primary       Nail discoloration        Signifiicant damage to nails from removal of false nails, and infection (fungal vs bacterial) in multiple,  particularly left 4th digit with splitting. Tx with clindamycin, epsom salt soaks with tea tree oil, neosporin and bandaging to left 4th digit until healed,and can try topical lamisil as directed for any fungal component from nail salon  Follow up plan: Return if symptoms worsen or fail to improve.

## 2018-03-25 DIAGNOSIS — F411 Generalized anxiety disorder: Secondary | ICD-10-CM | POA: Diagnosis not present

## 2018-03-25 NOTE — Patient Instructions (Signed)
Follow up as needed

## 2018-04-01 DIAGNOSIS — F411 Generalized anxiety disorder: Secondary | ICD-10-CM | POA: Diagnosis not present

## 2018-04-02 ENCOUNTER — Other Ambulatory Visit: Payer: Self-pay | Admitting: Family Medicine

## 2018-04-02 DIAGNOSIS — F3342 Major depressive disorder, recurrent, in full remission: Secondary | ICD-10-CM

## 2018-04-02 MED ORDER — LORAZEPAM 1 MG PO TABS: 0.5000 mg | ORAL_TABLET | Freq: Every day | ORAL | 0 refills | Status: DC | PRN

## 2018-04-02 MED ORDER — CYCLOBENZAPRINE HCL 10 MG PO TABS
ORAL_TABLET | ORAL | 1 refills | Status: DC
Start: 2018-04-02 — End: 2018-09-16

## 2018-04-02 NOTE — Telephone Encounter (Signed)
Copied from [place] [phone]. Topic: Quick Communication - Rx Refill/Question >> Apr 02, 2018 10:03 AM Lennox Solders wrote: Medication: cyclobenzapine 10 mg #90 with one refill  [facility] [place] [address].

## 2018-04-02 NOTE — Telephone Encounter (Signed)
cyclobenzaprine refill Last OV: 02/01/17 Last Refill:01/14/18 #30 tabs Pharmacy:[facility] [address] Jeananne Rama Pt requesting 90 tabs with 1 RF

## 2018-04-15 ENCOUNTER — Encounter: Payer: Self-pay | Admitting: Family Medicine

## 2018-04-15 ENCOUNTER — Ambulatory Visit (INDEPENDENT_AMBULATORY_CARE_PROVIDER_SITE_OTHER): Payer: Medicare Other | Admitting: Family Medicine

## 2018-04-15 VITALS — BP 114/77 | HR 76 | Temp 98.7°F | Wt 150.6 lb

## 2018-04-15 DIAGNOSIS — B309 Viral conjunctivitis, unspecified: Secondary | ICD-10-CM | POA: Diagnosis not present

## 2018-04-15 DIAGNOSIS — F411 Generalized anxiety disorder: Secondary | ICD-10-CM | POA: Diagnosis not present

## 2018-04-15 MED ORDER — ERYTHROMYCIN 5 MG/GM OP OINT: 1.0000 "application " | TOPICAL_OINTMENT | Freq: Four times a day (QID) | OPHTHALMIC | 0 refills | Status: DC

## 2018-04-15 NOTE — Progress Notes (Signed)
BP 114/77 (BP Location: Left Arm, Patient Position: Sitting, Cuff Size: Normal)   Pulse 76   Temp 98.7 F (37.1 C)   Wt 150 lb 9 oz (68.3 kg)   SpO2 100%   BMI 27.54 kg/m    Subjective:    Patient ID: Amanda Wang, female    DOB: 1951-03-02, 67 y.o.   MRN: [id number]  HPI: Amanda Wang is a 67 y.o. female  Chief Complaint  Patient presents with  . Eye Problem    left, started yesterday   EYE REDNESS Duration:  yesterday Involved eye:  left Onset: sudden Severity: moderate  Quality: itchy and watery Foreign body sensation:no Visual impairment: no Eye redness: yes Discharge: yes Crusting or matting of eyelids: yes Swelling: no Photophobia: no Itching: yes Tearing: yes Headache: no Floaters: no URI symptoms: no Contact lens use: no Close contacts with similar problems: no Eye trauma: no Status: stable Treatments attempted: none  Relevant past medical, surgical, family and social history reviewed and updated as indicated. Interim medical history since our last visit reviewed. Allergies and medications reviewed and updated.  Review of Systems  Constitutional: Negative.   HENT: Negative.   Eyes: Positive for discharge, redness and itching. Negative for photophobia, pain and visual disturbance.  Respiratory: Negative.   Cardiovascular: Negative.   Psychiatric/Behavioral: Negative.     Per HPI unless specifically indicated above     Objective:    BP 114/77 (BP Location: Left Arm, Patient Position: Sitting, Cuff Size: Normal)   Pulse 76   Temp 98.7 F (37.1 C)   Wt 150 lb 9 oz (68.3 kg)   SpO2 100%   BMI 27.54 kg/m   Wt Readings from Last 3 Encounters:  04/15/18 150 lb 9 oz (68.3 kg)  03/22/18 154 lb 8 oz (70.1 kg)  03/07/18 150 lb 4.8 oz (68.2 kg)    Physical Exam  Constitutional: She is oriented to person, place, and time. She appears well-developed and well-nourished. No distress.  HENT:  Head: Normocephalic and atraumatic.  Right  Ear: Hearing normal.  Left Ear: Hearing normal.  Nose: Nose normal.  Eyes: Pupils are equal, round, and reactive to light. EOM and lids are normal. Right eye exhibits no discharge. Left eye exhibits discharge. Right conjunctiva is not injected. Right conjunctiva has no hemorrhage. Left conjunctiva is injected. Left conjunctiva has no hemorrhage. No scleral icterus. Right eye exhibits normal extraocular motion and no nystagmus. Left eye exhibits normal extraocular motion and no nystagmus. Right pupil is round and reactive. Left pupil is round and reactive. Pupils are equal.  Neck: Normal range of motion. Neck supple. No JVD present. No tracheal deviation present. No thyromegaly present.  Pulmonary/Chest: Effort normal. No respiratory distress.  Musculoskeletal: Normal range of motion.  Lymphadenopathy:    She has no cervical adenopathy.  Neurological: She is alert and oriented to person, place, and time.  Skin: Skin is intact. No rash noted. She is not diaphoretic.  Psychiatric: She has a normal mood and affect. Her speech is normal and behavior is normal. Judgment and thought content normal. Cognition and memory are normal.    Results for orders placed or performed in visit on 40/98/11  Basic metabolic panel  Result Value Ref Range   Glucose 84 65 - 99 mg/dL   BUN 18 8 - 27 mg/dL   Creatinine, Ser 0.69 0.57 - 1.00 mg/dL   GFR calc non Af Amer 92 >59 mL/min/1.73   GFR calc Af Amer 106 >59  mL/min/1.73   BUN/Creatinine Ratio 26 12 - 28   Sodium 141 134 - 144 mmol/L   Potassium 4.3 3.5 - 5.2 mmol/L   Chloride 100 96 - 106 mmol/L   CO2 25 20 - 29 mmol/L   Calcium 9.3 8.7 - 10.3 mg/dL  LP+ALT+AST Piccolo, Waived  Result Value Ref Range   ALT (SGPT) Piccolo, Waived 23 10 - 47 U/L   AST (SGOT) Piccolo, Waived 26 11 - 38 U/L   Cholesterol Piccolo, Waived 175 <200 mg/dL   HDL Chol Piccolo, Waived 68 >59 mg/dL   Triglycerides Piccolo,Waived 164 (H) <150 mg/dL   Chol/HDL Ratio Piccolo,Waive  2.6 mg/dL   LDL Chol Calc Piccolo Waived 74 <100 mg/dL   VLDL Chol Calc Piccolo,Waive 33 (H) <30 mg/dL      Assessment & Plan:   Problem List Items Addressed This Visit    None    Visit Diagnoses    Viral conjunctivitis    -  Primary   Will treat with erythromycin due to inflammation. Call with any concerns or if not getting better.        Follow up plan: Return if symptoms worsen or fail to improve.

## 2018-04-15 NOTE — Patient Instructions (Signed)
Viral Conjunctivitis, Adult Viral conjunctivitis is an inflammation of the clear membrane that covers the white part of your eye and the inner surface of your eyelid (conjunctiva). The inflammation is caused by a viral infection. The blood vessels in the conjunctiva become inflamed, causing the eye to become red or pink, and often itchy. Viral conjunctivitis can be easily passed from one person to another (is contagious). This condition is often called pink eye. What are the causes? This condition is caused by a virus. A virus is a type of contagious germ. It can be spread by touching objects that have been contaminated with the virus, such as doorknobs or towels. It can also be passed through droplets, such as from coughing or sneezing. What are the signs or symptoms? Symptoms of this condition include:  Eye redness.  Tearing or watery eyes.  Itchy and irritated eyes.  Burning feeling in the eyes.  Clear drainage from the eye.  Swollen eyelids.  A gritty feeling in the eye.  Light sensitivity.  This condition often occurs with other symptoms, such as a fever, nausea, or a rash. How is this diagnosed? This condition is diagnosed with a medical history and physical exam. If you have discharge from your eye, the discharge may be tested to rule out other causes of conjunctivitis. How is this treated? Viral conjunctivitis does not respond to medicines that kill bacteria (antibiotics). Treatment for viral conjunctivitis is directed at stopping a bacterial infection from developing in addition to the viral infection. Treatment also aims to relieve your symptoms, such as itching. This may be done with antihistamine drops or other eye medicines. Rarely, steroid eye drops or antiviral medicines may be prescribed. Follow these instructions at home: Medicines   Take or apply over-the-counter and prescription medicines only as told by your health care provider.  Be very careful to avoid  touching the edge of the eyelid with the eye drop bottle or ointment tube when applying medicines to the affected eye. Being careful this way will stop you from spreading the infection to the other eye or to other people. Eye care  Avoid touching or rubbing your eyes.  Apply a warm, wet, clean washcloth to your eye for 10-20 minutes, 3-4 times per day or as told by your health care provider.  If you wear contact lenses, do not wear them until the inflammation is gone and your health care provider says it is safe to wear them again. Ask your health care provider how to sterilize or replace your contact lenses before using them again. Wear glasses until you can resume wearing contacts.  Avoid wearing eye makeup until the inflammation is gone. Throw away any old eye cosmetics that may be contaminated.  Gently wipe away any drainage from your eye with a warm, wet washcloth or a cotton ball. General instructions  Change or wash your pillowcase every day or as told by your health care provider.  Do not share towels, pillowcases, washcloths, eye makeup, makeup brushes, contact lenses, or glasses. This may spread the infection.  Wash your hands often with soap and water. Use paper towels to dry your hands. If soap and water are not available, use hand sanitizer.  Try to avoid contact with other people for one week or as told by your health care provider. Contact a health care provider if:  Your symptoms do not improve with treatment or they get worse.  You have increased pain.  Your vision becomes blurry.  You have a   fever.  You have facial pain, redness, or swelling.  You have yellow or green drainage coming from your eye.  You have new symptoms. This information is not intended to replace advice given to you by your health care provider. Make sure you discuss any questions you have with your health care provider. Document Released: 02/24/2003 Document Revised: 07/01/2016 Document  Reviewed: 06/20/2016 Elsevier Interactive Patient Education  2018 Elsevier Inc.  

## 2018-04-22 DIAGNOSIS — F411 Generalized anxiety disorder: Secondary | ICD-10-CM | POA: Diagnosis not present

## 2018-04-29 DIAGNOSIS — F411 Generalized anxiety disorder: Secondary | ICD-10-CM | POA: Diagnosis not present

## 2018-05-06 DIAGNOSIS — F411 Generalized anxiety disorder: Secondary | ICD-10-CM | POA: Diagnosis not present

## 2018-05-20 DIAGNOSIS — F411 Generalized anxiety disorder: Secondary | ICD-10-CM | POA: Diagnosis not present

## 2018-05-27 ENCOUNTER — Ambulatory Visit (INDEPENDENT_AMBULATORY_CARE_PROVIDER_SITE_OTHER): Payer: Medicare Other | Admitting: Physician Assistant

## 2018-05-27 ENCOUNTER — Encounter: Payer: Self-pay | Admitting: Physician Assistant

## 2018-05-27 ENCOUNTER — Ambulatory Visit: Payer: Medicare Other | Admitting: Nurse Practitioner

## 2018-05-27 VITALS — BP 116/74 | HR 79 | Temp 98.1°F | Ht 62.0 in | Wt 153.7 lb

## 2018-05-27 DIAGNOSIS — J3089 Other allergic rhinitis: Secondary | ICD-10-CM | POA: Diagnosis not present

## 2018-05-27 DIAGNOSIS — F411 Generalized anxiety disorder: Secondary | ICD-10-CM | POA: Diagnosis not present

## 2018-05-27 DIAGNOSIS — H1013 Acute atopic conjunctivitis, bilateral: Secondary | ICD-10-CM | POA: Diagnosis not present

## 2018-05-27 MED ORDER — MONTELUKAST SODIUM 10 MG PO TABS: 10.0000 mg | ORAL_TABLET | Freq: Every day | ORAL | 0 refills | Status: DC

## 2018-05-27 MED ORDER — OLOPATADINE HCL 0.1 % OP SOLN: 1.0000 [drp] | Freq: Two times a day (BID) | OPHTHALMIC | 12 refills | Status: DC

## 2018-05-27 NOTE — Progress Notes (Signed)
Subjective:    Patient ID: Amanda Wang, female    DOB: March 23, 1951, 67 y.o.   MRN: [id number]  Amanda Wang is a 67 y.o. female presenting on 05/27/2018 for Brodnax (Patient thinks she's allergic to hay)   HPI   Amanda Wang is a 67 y/o woman presenting with itchy watery eyes, scratchy throat, PND for one week. Has history of allergies requiring allergy shots, no longer taking them. Family member brought [place] pigs over who use hay for bedding, patient suspects she may be allergic. Took claritin for a couple of days without relief. Used OTC eye drops without relief.    Social History   Tobacco Use  . Smoking status: Former Smoker    Types: Cigarettes    Last attempt to quit: 07/26/1977    Years since quitting: 40.8  . Smokeless tobacco: Never Used  Substance Use Topics  . Alcohol use: Yes    Alcohol/week: 0.0 oz    Comment: on occasion  . Drug use: No    Review of Systems Per HPI unless specifically indicated above     Objective:    BP 116/74 (BP Location: Left Arm, Patient Position: Sitting, Cuff Size: Normal)   Pulse 79   Temp 98.1 F (36.7 C) (Oral)   Ht 5\' 2"  (1.575 m)   Wt 153 lb 11.2 oz (69.7 kg)   SpO2 99%   BMI 28.11 kg/m   Wt Readings from Last 3 Encounters:  05/27/18 153 lb 11.2 oz (69.7 kg)  04/15/18 150 lb 9 oz (68.3 kg)  03/22/18 154 lb 8 oz (70.1 kg)    Physical Exam  Constitutional: She appears well-developed and well-nourished.  HENT:  Right Ear: External ear normal.  Left Ear: External ear normal.  Nose: Nose normal.  Mouth/Throat: Oropharynx is clear and moist. No oropharyngeal exudate.  Eyes: Right eye exhibits discharge. Left eye exhibits discharge. Right conjunctiva is injected. Left conjunctiva is injected.  Neck: Neck supple.  Cardiovascular: Normal rate and regular rhythm.  Lymphadenopathy:    She has no cervical adenopathy.  Skin: Skin is warm and dry.  Psychiatric: She has a normal mood and affect. Her behavior is  normal.   Results for orders placed or performed in visit on 61/44/31  Basic metabolic panel  Result Value Ref Range   Glucose 84 65 - 99 mg/dL   BUN 18 8 - 27 mg/dL   Creatinine, Ser 0.69 0.57 - 1.00 mg/dL   GFR calc non Af Amer 92 >59 mL/min/1.73   GFR calc Af Amer 106 >59 mL/min/1.73   BUN/Creatinine Ratio 26 12 - 28   Sodium 141 134 - 144 mmol/L   Potassium 4.3 3.5 - 5.2 mmol/L   Chloride 100 96 - 106 mmol/L   CO2 25 20 - 29 mmol/L   Calcium 9.3 8.7 - 10.3 mg/dL  LP+ALT+AST Piccolo, Waived  Result Value Ref Range   ALT (SGPT) Piccolo, Waived 23 10 - 47 U/L   AST (SGOT) Piccolo, Waived 26 11 - 38 U/L   Cholesterol Piccolo, Waived 175 <200 mg/dL   HDL Chol Piccolo, Waived 68 >59 mg/dL   Triglycerides Piccolo,Waived 164 (H) <150 mg/dL   Chol/HDL Ratio Piccolo,Waive 2.6 mg/dL   LDL Chol Calc Piccolo Waived 74 <100 mg/dL   VLDL Chol Calc Piccolo,Waive 33 (H) <30 mg/dL      Assessment & Plan:   1. Allergic conjunctivitis of both eyes  Treat as below. May have some component of seasonal  allergies. Add singulair, continue claritin.  - olopatadine (PATANOL) 0.1 % ophthalmic solution; Place 1 drop into both eyes 2 (two) times daily.  Dispense: 5 mL; Refill: 12 - montelukast (SINGULAIR) 10 MG tablet; Take 1 tablet (10 mg total) by mouth at bedtime.  Dispense: 90 tablet; Refill: 0  2. Seasonal allergic rhinitis due to other allergic trigger  - montelukast (SINGULAIR) 10 MG tablet; Take 1 tablet (10 mg total) by mouth at bedtime.  Dispense: 90 tablet; Refill: 0   Follow up plan: Return if symptoms worsen or fail to improve.  Carles Collet, PA-C [facility] 05/27/2018, 3:23 PM

## 2018-05-27 NOTE — Patient Instructions (Addendum)
May try Zantac 150 mg BID instead of the omeprazole.    Allergic Conjunctivitis A clear membrane (conjunctiva) covers the white part of your eye and the inner surface of your eyelid. Allergic conjunctivitis happens when this membrane has inflammation. This is caused by allergies. Common causes of allergic reactions (allergens)include:  Outdoor allergens, such as: ? Pollen. ? Grass and weeds. ? Mold spores.  Indoor allergens, such as: ? Dust. ? Smoke. ? Mold. ? Pet dander. ? Animal hair.  This condition can make your eye red or pink. It can also make your eye feel itchy. This condition cannot be spread from one person to another person (is not contagious). Follow these instructions at home:  Try not to be around things that you are allergic to.  Take or apply over-the-counter and prescription medicines only as told by your doctor. These include any eye drops.  Place a cool, clean washcloth on your eye for 10-20 minutes. Do this 3-4 times a day.  Do not touch or rub your eyes.  Do not wear contact lenses until the inflammation is gone. Wear glasses instead.  Do not wear eye makeup until the inflammation is gone.  Keep all follow-up visits as told by your doctor. This is important. Contact a doctor if:  Your symptoms get worse.  Your symptoms do not get better with treatment.  You have mild eye pain.  You are sensitive to light,  You have spots or blisters on your eyes.  You have pus coming from your eye.  You have a fever. Get help right away if:  You have redness, swelling, or other symptoms in only one eye.  Your vision is blurry.  You have vision changes.  You have very bad eye pain. Summary  Allergic conjunctivitis is caused by allergies. It can make your eye red or pink, and it can make your eye feel itchy.  This condition cannot be spread from one person to another person (is not contagious).  Try not to be around things that you are allergic  to.  Take or apply over-the-counter and prescription medicines only as told by your doctor. These include any eye drops.  Contact your doctor if your symptoms get worse or they do not get better with treatment. This information is not intended to replace advice given to you by your health care provider. Make sure you discuss any questions you have with your health care provider. Document Released: 05/24/2010 Document Revised: 07/28/2016 Document Reviewed: 07/28/2016 Elsevier Interactive Patient Education  2017 [employer].

## 2018-06-03 DIAGNOSIS — F411 Generalized anxiety disorder: Secondary | ICD-10-CM | POA: Diagnosis not present

## 2018-06-10 DIAGNOSIS — F411 Generalized anxiety disorder: Secondary | ICD-10-CM | POA: Diagnosis not present

## 2018-06-11 ENCOUNTER — Encounter: Payer: Self-pay | Admitting: Family Medicine

## 2018-06-17 DIAGNOSIS — F411 Generalized anxiety disorder: Secondary | ICD-10-CM | POA: Diagnosis not present

## 2018-06-24 DIAGNOSIS — F411 Generalized anxiety disorder: Secondary | ICD-10-CM | POA: Diagnosis not present

## 2018-07-01 DIAGNOSIS — F411 Generalized anxiety disorder: Secondary | ICD-10-CM | POA: Diagnosis not present

## 2018-07-03 DIAGNOSIS — G43A0 Cyclical vomiting, not intractable: Secondary | ICD-10-CM | POA: Diagnosis not present

## 2018-07-03 DIAGNOSIS — Z9884 Bariatric surgery status: Secondary | ICD-10-CM | POA: Diagnosis not present

## 2018-07-03 DIAGNOSIS — I10 Essential (primary) hypertension: Secondary | ICD-10-CM | POA: Diagnosis not present

## 2018-07-05 ENCOUNTER — Other Ambulatory Visit: Payer: Self-pay | Admitting: Specialist

## 2018-07-05 ENCOUNTER — Other Ambulatory Visit (HOSPITAL_COMMUNITY): Payer: Self-pay | Admitting: Specialist

## 2018-07-05 DIAGNOSIS — R1115 Cyclical vomiting syndrome unrelated to migraine: Secondary | ICD-10-CM

## 2018-07-15 DIAGNOSIS — F411 Generalized anxiety disorder: Secondary | ICD-10-CM | POA: Diagnosis not present

## 2018-07-18 ENCOUNTER — Encounter
Admission: RE | Admit: 2018-07-18 | Discharge: 2018-07-18 | Disposition: A | Discharge status: Home / Self Care | Payer: Medicare Other | Source: Ambulatory Visit | Attending: Specialist | Admitting: Specialist

## 2018-07-18 DIAGNOSIS — G43A0 Cyclical vomiting, not intractable: Secondary | ICD-10-CM | POA: Insufficient documentation

## 2018-07-18 DIAGNOSIS — R1115 Cyclical vomiting syndrome unrelated to migraine: Secondary | ICD-10-CM

## 2018-07-19 ENCOUNTER — Encounter
Admission: RE | Admit: 2018-07-19 | Discharge: 2018-07-19 | Disposition: A | Discharge status: Home / Self Care | Payer: Medicare Other | Source: Ambulatory Visit | Attending: Specialist | Admitting: Specialist

## 2018-07-19 DIAGNOSIS — R1115 Cyclical vomiting syndrome unrelated to migraine: Secondary | ICD-10-CM

## 2018-07-19 DIAGNOSIS — G43A0 Cyclical vomiting, not intractable: Secondary | ICD-10-CM | POA: Insufficient documentation

## 2018-07-19 MED ORDER — TECHNETIUM TC 99M SULFUR COLLOID
2.0000 | Freq: Once | INTRAVENOUS | Status: AC | PRN
  Administered 2018-07-19: 2.5 via INTRAVENOUS

## 2018-07-22 ENCOUNTER — Ambulatory Visit
Admission: RE | Admit: 2018-07-22 | Discharge: 2018-07-22 | Disposition: A | Discharge status: Home / Self Care | Payer: Medicare Other | Source: Ambulatory Visit | Attending: Specialist | Admitting: Specialist

## 2018-07-22 DIAGNOSIS — G43A0 Cyclical vomiting, not intractable: Secondary | ICD-10-CM | POA: Insufficient documentation

## 2018-07-22 DIAGNOSIS — K449 Diaphragmatic hernia without obstruction or gangrene: Secondary | ICD-10-CM | POA: Insufficient documentation

## 2018-07-22 DIAGNOSIS — F411 Generalized anxiety disorder: Secondary | ICD-10-CM | POA: Diagnosis not present

## 2018-07-22 DIAGNOSIS — Z9884 Bariatric surgery status: Secondary | ICD-10-CM | POA: Diagnosis not present

## 2018-07-22 DIAGNOSIS — K219 Gastro-esophageal reflux disease without esophagitis: Secondary | ICD-10-CM | POA: Diagnosis not present

## 2018-07-29 DIAGNOSIS — Z885 Allergy status to narcotic agent status: Secondary | ICD-10-CM | POA: Diagnosis not present

## 2018-07-29 DIAGNOSIS — R1110 Vomiting, unspecified: Secondary | ICD-10-CM | POA: Diagnosis not present

## 2018-07-29 DIAGNOSIS — G4730 Sleep apnea, unspecified: Secondary | ICD-10-CM | POA: Diagnosis not present

## 2018-07-29 DIAGNOSIS — K449 Diaphragmatic hernia without obstruction or gangrene: Secondary | ICD-10-CM | POA: Diagnosis not present

## 2018-07-29 DIAGNOSIS — I10 Essential (primary) hypertension: Secondary | ICD-10-CM | POA: Diagnosis not present

## 2018-07-29 DIAGNOSIS — K219 Gastro-esophageal reflux disease without esophagitis: Secondary | ICD-10-CM | POA: Diagnosis not present

## 2018-07-29 DIAGNOSIS — G43A0 Cyclical vomiting, not intractable: Secondary | ICD-10-CM | POA: Diagnosis not present

## 2018-07-29 DIAGNOSIS — Z79899 Other long term (current) drug therapy: Secondary | ICD-10-CM | POA: Diagnosis not present

## 2018-07-29 DIAGNOSIS — Z9884 Bariatric surgery status: Secondary | ICD-10-CM | POA: Diagnosis not present

## 2018-07-29 DIAGNOSIS — R112 Nausea with vomiting, unspecified: Secondary | ICD-10-CM | POA: Diagnosis not present

## 2018-07-29 DIAGNOSIS — Z01818 Encounter for other preprocedural examination: Secondary | ICD-10-CM | POA: Diagnosis not present

## 2018-07-29 DIAGNOSIS — K2950 Unspecified chronic gastritis without bleeding: Secondary | ICD-10-CM | POA: Diagnosis not present

## 2018-07-29 DIAGNOSIS — Z882 Allergy status to sulfonamides status: Secondary | ICD-10-CM | POA: Diagnosis not present

## 2018-08-05 DIAGNOSIS — F411 Generalized anxiety disorder: Secondary | ICD-10-CM | POA: Diagnosis not present

## 2018-08-12 DIAGNOSIS — F411 Generalized anxiety disorder: Secondary | ICD-10-CM | POA: Diagnosis not present

## 2018-08-15 ENCOUNTER — Encounter: Payer: Medicare Other | Admitting: Family Medicine

## 2018-08-16 ENCOUNTER — Other Ambulatory Visit: Payer: Self-pay | Admitting: Family Medicine

## 2018-08-16 DIAGNOSIS — I10 Essential (primary) hypertension: Secondary | ICD-10-CM

## 2018-08-16 DIAGNOSIS — G935 Compression of brain: Secondary | ICD-10-CM

## 2018-08-16 DIAGNOSIS — E785 Hyperlipidemia, unspecified: Secondary | ICD-10-CM

## 2018-08-16 NOTE — Telephone Encounter (Signed)
Norvasc 5mg  refill Last Refill:  08/07/17# 90Last OV: 03/07/18 PCP: [facility]: [id number]  Lipitor40mg  refill Last Refill:08/07/17 # 90 Last OV: 03/07/18 PCP: Jeananne Rama Pharmacy:[facility]  Keppra 500mg  refill Last Refill:08/07/18# 90 Last OV: 03/07/18 PCP: Jeananne Rama Pharmacy:[facility]

## 2018-08-19 DIAGNOSIS — F411 Generalized anxiety disorder: Secondary | ICD-10-CM | POA: Diagnosis not present

## 2018-08-22 DIAGNOSIS — I10 Essential (primary) hypertension: Secondary | ICD-10-CM | POA: Diagnosis not present

## 2018-08-22 DIAGNOSIS — Z9884 Bariatric surgery status: Secondary | ICD-10-CM | POA: Diagnosis not present

## 2018-08-22 DIAGNOSIS — E785 Hyperlipidemia, unspecified: Secondary | ICD-10-CM | POA: Diagnosis not present

## 2018-08-22 DIAGNOSIS — K219 Gastro-esophageal reflux disease without esophagitis: Secondary | ICD-10-CM | POA: Diagnosis not present

## 2018-08-22 DIAGNOSIS — K449 Diaphragmatic hernia without obstruction or gangrene: Secondary | ICD-10-CM | POA: Diagnosis not present

## 2018-08-22 DIAGNOSIS — E7849 Other hyperlipidemia: Secondary | ICD-10-CM | POA: Diagnosis not present

## 2018-08-22 DIAGNOSIS — Z79899 Other long term (current) drug therapy: Secondary | ICD-10-CM | POA: Diagnosis not present

## 2018-08-22 DIAGNOSIS — R569 Unspecified convulsions: Secondary | ICD-10-CM | POA: Diagnosis not present

## 2018-08-23 DIAGNOSIS — E785 Hyperlipidemia, unspecified: Secondary | ICD-10-CM | POA: Diagnosis not present

## 2018-08-23 DIAGNOSIS — K219 Gastro-esophageal reflux disease without esophagitis: Secondary | ICD-10-CM | POA: Diagnosis not present

## 2018-08-23 DIAGNOSIS — K449 Diaphragmatic hernia without obstruction or gangrene: Secondary | ICD-10-CM | POA: Diagnosis not present

## 2018-08-23 DIAGNOSIS — Z9884 Bariatric surgery status: Secondary | ICD-10-CM | POA: Diagnosis not present

## 2018-08-23 DIAGNOSIS — I10 Essential (primary) hypertension: Secondary | ICD-10-CM | POA: Diagnosis not present

## 2018-08-23 DIAGNOSIS — R569 Unspecified convulsions: Secondary | ICD-10-CM | POA: Diagnosis not present

## 2018-09-16 ENCOUNTER — Ambulatory Visit (INDEPENDENT_AMBULATORY_CARE_PROVIDER_SITE_OTHER): Payer: Medicare Other | Admitting: Family Medicine

## 2018-09-16 ENCOUNTER — Encounter: Payer: Self-pay | Admitting: Family Medicine

## 2018-09-16 VITALS — BP 131/81 | HR 96 | Ht 61.42 in | Wt 145.2 lb

## 2018-09-16 DIAGNOSIS — I10 Essential (primary) hypertension: Secondary | ICD-10-CM | POA: Diagnosis not present

## 2018-09-16 DIAGNOSIS — Z111 Encounter for screening for respiratory tuberculosis: Secondary | ICD-10-CM

## 2018-09-16 DIAGNOSIS — Z1329 Encounter for screening for other suspected endocrine disorder: Secondary | ICD-10-CM | POA: Diagnosis not present

## 2018-09-16 DIAGNOSIS — Z780 Asymptomatic menopausal state: Secondary | ICD-10-CM

## 2018-09-16 DIAGNOSIS — G2581 Restless legs syndrome: Secondary | ICD-10-CM

## 2018-09-16 DIAGNOSIS — G935 Compression of brain: Secondary | ICD-10-CM

## 2018-09-16 DIAGNOSIS — F3342 Major depressive disorder, recurrent, in full remission: Secondary | ICD-10-CM

## 2018-09-16 DIAGNOSIS — E785 Hyperlipidemia, unspecified: Secondary | ICD-10-CM

## 2018-09-16 DIAGNOSIS — Z131 Encounter for screening for diabetes mellitus: Secondary | ICD-10-CM

## 2018-09-16 DIAGNOSIS — Z1231 Encounter for screening mammogram for malignant neoplasm of breast: Secondary | ICD-10-CM | POA: Diagnosis not present

## 2018-09-16 DIAGNOSIS — Z0000 Encounter for general adult medical examination without abnormal findings: Secondary | ICD-10-CM | POA: Diagnosis not present

## 2018-09-16 DIAGNOSIS — K219 Gastro-esophageal reflux disease without esophagitis: Secondary | ICD-10-CM | POA: Diagnosis not present

## 2018-09-16 DIAGNOSIS — Z7189 Other specified counseling: Secondary | ICD-10-CM

## 2018-09-16 DIAGNOSIS — Z8719 Personal history of other diseases of the digestive system: Secondary | ICD-10-CM | POA: Insufficient documentation

## 2018-09-16 DIAGNOSIS — Z1239 Encounter for other screening for malignant neoplasm of breast: Secondary | ICD-10-CM

## 2018-09-16 DIAGNOSIS — H1013 Acute atopic conjunctivitis, bilateral: Secondary | ICD-10-CM | POA: Diagnosis not present

## 2018-09-16 DIAGNOSIS — Z23 Encounter for immunization: Secondary | ICD-10-CM | POA: Diagnosis not present

## 2018-09-16 DIAGNOSIS — F411 Generalized anxiety disorder: Secondary | ICD-10-CM | POA: Diagnosis not present

## 2018-09-16 DIAGNOSIS — J3089 Other allergic rhinitis: Secondary | ICD-10-CM

## 2018-09-16 DIAGNOSIS — Z9889 Other specified postprocedural states: Secondary | ICD-10-CM | POA: Diagnosis not present

## 2018-09-16 DIAGNOSIS — E7800 Pure hypercholesterolemia, unspecified: Secondary | ICD-10-CM | POA: Diagnosis not present

## 2018-09-16 HISTORY — DX: Other specified counseling: Z71.89

## 2018-09-16 LAB — URINALYSIS, ROUTINE W REFLEX MICROSCOPIC
BILIRUBIN UA: NEGATIVE
GLUCOSE, UA: NEGATIVE
LEUKOCYTES UA: NEGATIVE
Nitrite, UA: NEGATIVE
PH UA: 5.5 (ref 5.0–7.5)
RBC, UA: NEGATIVE
Specific Gravity, UA: 1.02 (ref 1.005–1.030)
Urobilinogen, Ur: 1 mg/dL (ref 0.2–1.0)

## 2018-09-16 MED ORDER — LEVETIRACETAM 500 MG PO TABS: 500.0000 mg | ORAL_TABLET | Freq: Every day | ORAL | 4 refills | Status: DC

## 2018-09-16 MED ORDER — GABAPENTIN 300 MG PO CAPS: 300.0000 mg | ORAL_CAPSULE | Freq: Three times a day (TID) | ORAL | 4 refills | Status: DC

## 2018-09-16 MED ORDER — AMLODIPINE BESYLATE 5 MG PO TABS: 5.0000 mg | ORAL_TABLET | Freq: Every day | ORAL | 4 refills | Status: DC

## 2018-09-16 MED ORDER — ATORVASTATIN CALCIUM 40 MG PO TABS: 40.0000 mg | ORAL_TABLET | Freq: Every day | ORAL | 4 refills | Status: DC

## 2018-09-16 MED ORDER — VILAZODONE HCL 10 MG PO TABS: 20.0000 mg | ORAL_TABLET | Freq: Every day | ORAL | 4 refills | Status: DC

## 2018-09-16 MED ORDER — CYCLOBENZAPRINE HCL 10 MG PO TABS: ORAL_TABLET | ORAL | 4 refills | Status: DC

## 2018-09-16 MED ORDER — BENAZEPRIL HCL 20 MG PO TABS: 20.0000 mg | ORAL_TABLET | Freq: Every day | ORAL | 4 refills | Status: DC

## 2018-09-16 MED ORDER — MONTELUKAST SODIUM 10 MG PO TABS: 10.0000 mg | ORAL_TABLET | Freq: Every day | ORAL | 4 refills | Status: DC

## 2018-09-16 MED ORDER — HYDROCHLOROTHIAZIDE 25 MG PO TABS: 25.0000 mg | ORAL_TABLET | Freq: Every day | ORAL | 4 refills | Status: DC

## 2018-09-16 NOTE — Assessment & Plan Note (Signed)
Recovering from surgery.

## 2018-09-16 NOTE — Patient Instructions (Addendum)
Please call [facility] @ [phone] to schedule your mammogram and bone density!    Influenza (Flu) Vaccine (Inactivated or Recombinant): What You Need to Know 1. Why get vaccinated? Influenza ("flu") is a contagious disease that spreads around the [place] every year, usually between October and May. Flu is caused by influenza viruses, and is spread mainly by coughing, sneezing, and close contact. Anyone can get flu. Flu strikes suddenly and can last several days. Symptoms vary by age, but can include:  fever/chills  sore throat  muscle aches  fatigue  cough  headache  runny or stuffy nose  Flu can also lead to pneumonia and blood infections, and cause diarrhea and seizures in children. If you have a medical condition, such as heart or lung disease, flu can make it worse. Flu is more dangerous for some people. Infants and young children, people 66 years of age and older, pregnant women, and people with certain health conditions or a weakened immune system are at greatest risk. Each year thousands of people in the [place] die from flu, and many more are hospitalized. Flu vaccine can:  keep you from getting flu,  make flu less severe if you do get it, and  keep you from spreading flu to your family and other people. 2. Inactivated and recombinant flu vaccines A dose of flu vaccine is recommended every flu season. Children 6 months through 59 years of age may need two doses during the same flu season. Everyone else needs only one dose each flu season. Some inactivated flu vaccines contain a very small amount of a mercury-based preservative called thimerosal. Studies have not shown thimerosal in vaccines to be harmful, but flu vaccines that do not contain thimerosal are available. There is no live flu virus in flu shots. They cannot cause the flu. There are many flu viruses, and they are always changing. Each year a new flu vaccine is made to protect against three or  four viruses that are likely to cause disease in the upcoming flu season. But even when the vaccine doesn't exactly match these viruses, it may still provide some protection. Flu vaccine cannot prevent:  flu that is caused by a virus not covered by the vaccine, or  illnesses that look like flu but are not.  It takes about 2 weeks for protection to develop after vaccination, and protection lasts through the flu season. 3. Some people should not get this vaccine Tell the person who is giving you the vaccine:  If you have any severe, life-threatening allergies. If you ever had a life-threatening allergic reaction after a dose of flu vaccine, or have a severe allergy to any part of this vaccine, you may be advised not to get vaccinated. Most, but not all, types of flu vaccine contain a small amount of egg protein.  If you ever had Guillain-Barr Syndrome (also called GBS). Some people with a history of GBS should not get this vaccine. This should be discussed with your doctor.  If you are not feeling well. It is usually okay to get flu vaccine when you have a mild illness, but you might be asked to come back when you feel better.  4. Risks of a vaccine reaction With any medicine, including vaccines, there is a chance of reactions. These are usually mild and go away on their own, but serious reactions are also possible. Most people who get a flu shot do not have any problems with it. Minor problems following a flu  shot include:  soreness, redness, or swelling where the shot was given  hoarseness  sore, red or itchy eyes  cough  fever  aches  headache  itching  fatigue  If these problems occur, they usually begin soon after the shot and last 1 or 2 days. More serious problems following a flu shot can include the following:  There may be a small increased risk of Guillain-Barre Syndrome (GBS) after inactivated flu vaccine. This risk has been estimated at 1 or 2 additional cases  per million people vaccinated. This is much lower than the risk of severe complications from flu, which can be prevented by flu vaccine.  Young children who get the flu shot along with pneumococcal vaccine (PCV13) and/or DTaP vaccine at the same time might be slightly more likely to have a seizure caused by fever. Ask your doctor for more information. Tell your doctor if a child who is getting flu vaccine has ever had a seizure.  Problems that could happen after any injected vaccine:  People sometimes faint after a medical procedure, including vaccination. Sitting or lying down for about 15 minutes can help prevent fainting, and injuries caused by a fall. Tell your doctor if you feel dizzy, or have vision changes or ringing in the ears.  Some people get severe pain in the shoulder and have difficulty moving the arm where a shot was given. This happens very rarely.  Any medication can cause a severe allergic reaction. Such reactions from a vaccine are very rare, estimated at about 1 in a million doses, and would happen within a few minutes to a few hours after the vaccination. As with any medicine, there is a very remote chance of a vaccine causing a serious injury or death. The safety of vaccines is always being monitored. For more information, visit: http://www.aguilar.org/ 5. What if there is a serious reaction? What should I look for? Look for anything that concerns you, such as signs of a severe allergic reaction, very high fever, or unusual behavior. Signs of a severe allergic reaction can include hives, swelling of the face and throat, difficulty breathing, a fast heartbeat, dizziness, and weakness. These would start a few minutes to a few hours after the vaccination. What should I do?  If you think it is a severe allergic reaction or other emergency that can't wait, call 9-1-1 and get the person to the nearest hospital. Otherwise, call your doctor.  Reactions should be reported to the  Vaccine Adverse Event Reporting System (VAERS). Your doctor should file this report, or you can do it yourself through the VAERS web site at www.vaers.SamedayNews.es, or by calling 575-263-8390. ? VAERS does not give medical advice. 6. The National Vaccine Injury Compensation Program The [employer] Vaccine Injury Compensation Program (VICP) is a federal program that was created to compensate people who may have been injured by certain vaccines. Persons who believe they may have been injured by a vaccine can learn about the program and about filing a claim by calling 253-401-3426 or visiting the [place] website at GoldCloset.com.ee. There is a time limit to file a claim for compensation. 7. How can I learn more?  Ask your healthcare provider. He or she can give you the vaccine package insert or suggest other sources of information.  Call your local or state health department.  Contact the Centers for Disease Control and Prevention (CDC): ? Call 6782750848 (1-800-CDC-INFO) or ? Visit CDC's website at https://gibson.com/ Vaccine Information Statement, Inactivated Influenza Vaccine (07/24/2014) This information  is not intended to replace advice given to you by your health care provider. Make sure you discuss any questions you have with your health care provider. Document Released: 09/28/2006 Document Revised: 08/24/2016 Document Reviewed: 08/24/2016 Elsevier Interactive Patient Education  2017 [employer].

## 2018-09-16 NOTE — Progress Notes (Signed)
BP 131/81   Pulse 96   Ht 5' 1.42" (1.56 m)   Wt 145 lb 3.2 oz (65.9 kg)   SpO2 99%   BMI 27.06 kg/m    Subjective:    Patient ID: Amanda Wang, female    DOB: November 24, 1951, 67 y.o.   MRN: [id number]  HPI: Amanda Wang is a 67 y.o. female  Chief Complaint  Patient presents with  . Annual Exam  Patient had hiatal hernia surgery and has recovered well from the surgery.  Patient's hiatal hernia was very severe and was throwing up on a regular basis but that is now recovered. Patient is retired from teaching and heavy stress has resolved.  Has found though the taking Viibryd 20 mg works better than 10 and is stable. Blood pressure cholesterol stable with medications wants to switch from combination Benzapril hydrochlorothiazide to the single ingredient dosing because of cost concerns. Taking cyclobenzaprine without problems does take it most days.  Is doing well with gabapentin and Keppra no seizure or twitches in her legs.  This also controls body twitches.  This is after her brain surgery. Uses lorazepam on a very rare basis has 30 tablets that was given in April with no refills still has some left.  Relevant past medical, surgical, family and social history reviewed and updated as indicated. Interim medical history since our last visit reviewed. Allergies and medications reviewed and updated.  Review of Systems  Constitutional: Negative.   HENT: Negative.   Eyes: Negative.   Respiratory: Negative.   Cardiovascular: Negative.   Gastrointestinal: Negative.   Endocrine: Negative.   Genitourinary: Negative.   Musculoskeletal: Negative.   Skin: Negative.   Allergic/Immunologic: Negative.   Neurological: Negative.   Hematological: Negative.   Psychiatric/Behavioral: Negative.     Per HPI unless specifically indicated above     Objective:    BP 131/81   Pulse 96   Ht 5' 1.42" (1.56 m)   Wt 145 lb 3.2 oz (65.9 kg)   SpO2 99%   BMI 27.06 kg/m   Wt Readings from  Last 3 Encounters:  09/16/18 145 lb 3.2 oz (65.9 kg)  05/27/18 153 lb 11.2 oz (69.7 kg)  04/15/18 150 lb 9 oz (68.3 kg)    Physical Exam  Constitutional: She is oriented to person, place, and time. She appears well-developed and well-nourished.  HENT:  Head: Normocephalic and atraumatic.  Right Ear: External ear normal.  Left Ear: External ear normal.  Nose: Nose normal.  Mouth/Throat: Oropharynx is clear and moist.  Eyes: Pupils are equal, round, and reactive to light. Conjunctivae and EOM are normal.  Neck: Normal range of motion. Neck supple. Carotid bruit is not present.  Cardiovascular: Normal rate, regular rhythm and normal heart sounds.  No murmur heard. Pulmonary/Chest: Effort normal and breath sounds normal. She exhibits no mass. Right breast exhibits no mass, no skin change and no tenderness. Left breast exhibits no mass, no skin change and no tenderness. Breasts are symmetrical.  Abdominal: Soft. Bowel sounds are normal. There is no hepatosplenomegaly.  Musculoskeletal: Normal range of motion.  Neurological: She is alert and oriented to person, place, and time.  Skin: Skin is warm and dry. No rash noted.  Psychiatric: She has a normal mood and affect. Her behavior is normal. Judgment and thought content normal.    Results for orders placed or performed in visit on 71/24/58  Basic metabolic panel  Result Value Ref Range   Glucose 84 65 - 99  mg/dL   BUN 18 8 - 27 mg/dL   Creatinine, Ser 0.69 0.57 - 1.00 mg/dL   GFR calc non Af Amer 92 >59 mL/min/1.73   GFR calc Af Amer 106 >59 mL/min/1.73   BUN/Creatinine Ratio 26 12 - 28   Sodium 141 134 - 144 mmol/L   Potassium 4.3 3.5 - 5.2 mmol/L   Chloride 100 96 - 106 mmol/L   CO2 25 20 - 29 mmol/L   Calcium 9.3 8.7 - 10.3 mg/dL  LP+ALT+AST Piccolo, Waived  Result Value Ref Range   ALT (SGPT) Piccolo, Waived 23 10 - 47 U/L   AST (SGOT) Piccolo, Waived 26 11 - 38 U/L   Cholesterol Piccolo, Waived 175 <200 mg/dL   HDL Chol  Piccolo, Waived 68 >59 mg/dL   Triglycerides Piccolo,Waived 164 (H) <150 mg/dL   Chol/HDL Ratio Piccolo,Waive 2.6 mg/dL   LDL Chol Calc Piccolo Waived 74 <100 mg/dL   VLDL Chol Calc Piccolo,Waive 33 (H) <30 mg/dL      Assessment & Plan:   Problem List Items Addressed This Visit      Cardiovascular and Mediastinum   Hypertension - Primary    The current medical regimen is effective;  continue present plan and medications.       Relevant Medications   benazepril (LOTENSIN) 20 MG tablet   hydrochlorothiazide (HYDRODIURIL) 25 MG tablet   atorvastatin (LIPITOR) 40 MG tablet   amLODipine (NORVASC) 5 MG tablet   Other Relevant Orders   CBC with Differential/Platelet   Comprehensive metabolic panel   Lipid panel   Urinalysis, Routine w reflex microscopic     Digestive   GERD (gastroesophageal reflux disease)    Recovering from surgery        Nervous and Auditory   Chiari malformation type I (HCC)    Stable on meds      Relevant Medications   levETIRAcetam (KEPPRA) 500 MG tablet     Other   Other hyperlipidemia   Relevant Medications   benazepril (LOTENSIN) 20 MG tablet   hydrochlorothiazide (HYDRODIURIL) 25 MG tablet   atorvastatin (LIPITOR) 40 MG tablet   amLODipine (NORVASC) 5 MG tablet   Depression    The current medical regimen is effective;  continue present plan and medications.       Relevant Medications   Vilazodone HCl (VIIBRYD) 10 MG TABS   Restless legs    The current medical regimen is effective;  continue present plan and medications.       Advanced care planning/counseling discussion    A voluntary discussion about advanced care planning including explanation and discussion of advanced directives was extentively discussed with the patient.  Explained about the healthcare proxy and living will was reviewed and packet with forms with expiration of how to fill them out was given.  Time spent: Encounter 16+ min individuals present: Patient       Personal history of surgery to heart and great vessels, presenting hazards to health    Mult lab requests from surgery      Relevant Orders   Magnesium   Vitamin B1   Ferritin   Vitamin K1, Serum   Copper, Free   Vitamin E   PTH, intact (no Ca)   Zinc   Urine Drug Panel 7   Vitamin A   Iron and TIBC   Nicotine/cotinine metabolites   Hgb A1c w/o eAG   Personal history of digestive disease    Mult labs ordered after surgery  Relevant Orders   Magnesium   Vitamin B1   Ferritin   Vitamin K1, Serum   Copper, Free   Vitamin E   PTH, intact (no Ca)   Zinc   Urine Drug Panel 7   Vitamin A   Iron and TIBC   Nicotine/cotinine metabolites   Hgb A1c w/o eAG    Other Visit Diagnoses    Screening for diabetes mellitus (DM)       Relevant Orders   CBC with Differential/Platelet   Comprehensive metabolic panel   Lipid panel   Urinalysis, Routine w reflex microscopic   Thyroid disorder screen       Relevant Orders   TSH   Need for influenza vaccination       Relevant Orders   Flu vaccine HIGH DOSE PF (Fluzone High dose) (Completed)   Breast cancer screening       Relevant Orders   MM DIGITAL SCREENING BILATERAL   Postmenopausal estrogen deficiency       Relevant Orders   DG Bone Density   Screening-pulmonary TB       Relevant Orders   QuantiFERON-TB Gold Plus   Allergic conjunctivitis of both eyes       Relevant Medications   montelukast (SINGULAIR) 10 MG tablet   Seasonal allergic rhinitis due to other allergic trigger       Relevant Medications   montelukast (SINGULAIR) 10 MG tablet       Follow up plan: Return in about 6 months (around 03/17/2019) for BMP,  Lipids, ALT, AST.

## 2018-09-16 NOTE — Assessment & Plan Note (Signed)
Mult lab requests from surgery

## 2018-09-16 NOTE — Assessment & Plan Note (Signed)
A voluntary discussion about advanced care planning including explanation and discussion of advanced directives was extentively discussed with the patient.  Explained about the healthcare proxy and living will was reviewed and packet with forms with expiration of how to fill them out was given.  Time spent: Encounter 16+ min individuals present: Patient 

## 2018-09-16 NOTE — Assessment & Plan Note (Signed)
The current medical regimen is effective;  continue present plan and medications.  

## 2018-09-16 NOTE — Assessment & Plan Note (Signed)
Stable on meds  

## 2018-09-16 NOTE — Assessment & Plan Note (Signed)
Mult labs ordered after surgery

## 2018-09-18 ENCOUNTER — Telehealth: Payer: Self-pay | Admitting: Family Medicine

## 2018-09-18 MED ORDER — VILAZODONE HCL 20 MG PO TABS: 20.0000 mg | ORAL_TABLET | Freq: Every day | ORAL | 4 refills | Status: DC

## 2018-09-18 NOTE — Telephone Encounter (Signed)
Copied from [place] [id number]. Topic: Quick Communication - See Telephone Encounter >> Sep 18, 2018 11:20 AM Conception Chancy, NT wrote: CRM for notification. See Telephone encounter for: 09/18/18.  Patient is calling and states she usually takes a 20 mg tablet of Vilazodone HCl (VIIBRYD) 10 MG TABS and not 2 10 mg tablets. She would like to know can a 90 day supply of the 20 mg tablet be called into her pharmacy.  [facility] [place], [place] AT [facility] [address] [place] [address] Phone: [phone] Fax: [phone]

## 2018-09-23 ENCOUNTER — Encounter: Payer: Self-pay | Admitting: Family Medicine

## 2018-09-23 DIAGNOSIS — F411 Generalized anxiety disorder: Secondary | ICD-10-CM | POA: Diagnosis not present

## 2018-09-26 LAB — COMPREHENSIVE METABOLIC PANEL
A/G RATIO: 1.5 (ref 1.2–2.2)
ALT: 10 IU/L (ref 0–32)
AST: 10 IU/L (ref 0–40)
Albumin: 4.3 g/dL (ref 3.6–4.8)
Alkaline Phosphatase: 104 IU/L (ref 39–117)
BILIRUBIN TOTAL: 0.4 mg/dL (ref 0.0–1.2)
BUN / CREAT RATIO: 19 (ref 12–28)
BUN: 12 mg/dL (ref 8–27)
CHLORIDE: 102 mmol/L (ref 96–106)
CO2: 27 mmol/L (ref 20–29)
Calcium: 9.3 mg/dL (ref 8.7–10.3)
Creatinine, Ser: 0.64 mg/dL (ref 0.57–1.00)
GFR calc non Af Amer: 94 mL/min/{1.73_m2} (ref >59)
GFR, EST AFRICAN AMERICAN: 108 mL/min/{1.73_m2} (ref >59)
Globulin, Total: 2.9 g/dL (ref 1.5–4.5)
Glucose: 87 mg/dL (ref 65–99)
POTASSIUM: 3.6 mmol/L (ref 3.5–5.2)
Sodium: 145 mmol/L — ABNORMAL HIGH (ref 134–144)
TOTAL PROTEIN: 7.2 g/dL (ref 6.0–8.5)

## 2018-09-26 LAB — CBC WITH DIFFERENTIAL/PLATELET
BASOS: 1 %
Basophils Absolute: 0 10*3/uL (ref 0.0–0.2)
EOS (ABSOLUTE): 0.4 10*3/uL (ref 0.0–0.4)
Eos: 6 %
Hematocrit: 36.5 % (ref 34.0–46.6)
Hemoglobin: 11.8 g/dL (ref 11.1–15.9)
IMMATURE GRANS (ABS): 0 10*3/uL (ref 0.0–0.1)
Immature Granulocytes: 0 %
LYMPHS ABS: 1.2 10*3/uL (ref 0.7–3.1)
LYMPHS: 18 %
MCH: 27.5 pg (ref 26.6–33.0)
MCHC: 32.3 g/dL (ref 31.5–35.7)
MCV: 85 fL (ref 79–97)
MONOS ABS: 0.4 10*3/uL (ref 0.1–0.9)
Monocytes: 7 %
NEUTROS ABS: 4.3 10*3/uL (ref 1.4–7.0)
Neutrophils: 68 %
PLATELETS: 321 10*3/uL (ref 150–450)
RBC: 4.29 x10E6/uL (ref 3.77–5.28)
RDW: 14.6 % (ref 12.3–15.4)
WBC: 6.3 10*3/uL (ref 3.4–10.8)

## 2018-09-26 LAB — IRON AND TIBC
IRON SATURATION: 21 % (ref 15–55)
Iron: 57 ug/dL (ref 27–139)
Total Iron Binding Capacity: 277 ug/dL (ref 250–450)
UIBC: 220 ug/dL (ref 118–369)

## 2018-09-26 LAB — LIPID PANEL
Chol/HDL Ratio: 2.6 ratio (ref 0.0–4.4)
Cholesterol, Total: 156 mg/dL (ref 100–199)
HDL: 60 mg/dL (ref >39)
LDL Calculated: 69 mg/dL (ref 0–99)
Triglycerides: 133 mg/dL (ref 0–149)
VLDL Cholesterol Cal: 27 mg/dL (ref 5–40)

## 2018-09-26 LAB — TSH: TSH: 1.3 u[IU]/mL (ref 0.450–4.500)

## 2018-09-26 LAB — QUANTIFERON-TB GOLD PLUS
QUANTIFERON TB2 AG VALUE: 0.01 [IU]/mL
QUANTIFERON-TB GOLD PLUS: NEGATIVE
QuantiFERON Mitogen Value: 5.85 IU/mL
QuantiFERON Nil Value: 0.01 IU/mL
QuantiFERON TB1 Ag Value: 0.01 IU/mL

## 2018-09-26 LAB — PARATHYROID HORMONE, INTACT (NO CA): PTH: 43 pg/mL (ref 15–65)

## 2018-09-26 LAB — MAGNESIUM: Magnesium: 2.2 mg/dL (ref 1.6–2.3)

## 2018-09-26 LAB — COPPER, FREE: COPPER, FREE: 790 ug/L

## 2018-09-26 LAB — HGB A1C W/O EAG: HEMOGLOBIN A1C: 6 % — AB (ref 4.8–5.6)

## 2018-09-26 LAB — FERRITIN: Ferritin: 32 ng/mL (ref 15–150)

## 2018-09-26 LAB — VITAMIN A: VITAMIN A: 29.5 ug/dL (ref 22.0–69.5)

## 2018-09-26 LAB — VITAMIN E
VITAMIN E (ALPHA TOCOPHEROL): 9.8 mg/L (ref 9.0–29.0)
Vitamin E(Gamma Tocopherol): 2.1 mg/L (ref 0.5–4.9)

## 2018-09-26 LAB — VITAMIN K1, SERUM: VITAMIN K1: 0.16 ng/mL (ref 0.13–1.88)

## 2018-09-26 LAB — VITAMIN B1: Thiamine: 110.6 nmol/L (ref 66.5–200.0)

## 2018-09-26 LAB — ZINC: ZINC: 99 ug/dL (ref 56–134)

## 2018-09-30 DIAGNOSIS — F411 Generalized anxiety disorder: Secondary | ICD-10-CM | POA: Diagnosis not present

## 2018-10-07 ENCOUNTER — Ambulatory Visit (INDEPENDENT_AMBULATORY_CARE_PROVIDER_SITE_OTHER): Payer: Medicare Other | Admitting: Family Medicine

## 2018-10-07 ENCOUNTER — Encounter: Payer: Self-pay | Admitting: Family Medicine

## 2018-10-07 DIAGNOSIS — R197 Diarrhea, unspecified: Secondary | ICD-10-CM | POA: Diagnosis not present

## 2018-10-07 NOTE — Assessment & Plan Note (Signed)
Discussed ongoing diarrhea discussed elimination diets and use of Imodium.  Will refer to GI to further evaluate.

## 2018-10-07 NOTE — Progress Notes (Signed)
BP 120/74 (BP Location: Left Arm, Patient Position: Sitting, Cuff Size: Normal)   Pulse 84   Temp 98.3 F (36.8 C) (Oral)   Ht 5\' 2"  (1.575 m)   Wt 145 lb (65.8 kg)   SpO2 100%   BMI 26.52 kg/m    Subjective:    Patient ID: Amanda Wang, female    DOB: 08-16-1951, 67 y.o.   MRN: [id number]  HPI: Amanda Wang is a 67 y.o. female  Chief Complaint  Patient presents with  . Diarrhea    Still having Diarrhea for months. Not getting any better.   Patient follow-up has had diarrhea upset stomach reflux symptoms since May.  Patient had surgery for reflux with stomach attachment for hiatal hernia in September.  Patient was hopeful that her diarrhea would resolve at that point.  Reflux symptoms have but continues to have diarrhea.  There are no systemic symptoms of fever chills.  There is no blood in her stool or urine.  No identified foods associated with change in her bowel habits.  Patient has been using Imodium on a as needed basis.  Patient has a tremendous amount of gas and loose bowels.  This is been occurring several times a day.  Patient just got some lipase several days ago and does seem to help some when she remembers to take it before she eats.  Relevant past medical, surgical, family and social history reviewed and updated as indicated. Interim medical history since our last visit reviewed. Allergies and medications reviewed and updated.  Review of Systems  Constitutional: Negative.   Respiratory: Negative.   Cardiovascular: Negative.     Per HPI unless specifically indicated above     Objective:    BP 120/74 (BP Location: Left Arm, Patient Position: Sitting, Cuff Size: Normal)   Pulse 84   Temp 98.3 F (36.8 C) (Oral)   Ht 5\' 2"  (1.575 m)   Wt 145 lb (65.8 kg)   SpO2 100%   BMI 26.52 kg/m   Wt Readings from Last 3 Encounters:  10/07/18 145 lb (65.8 kg)  09/16/18 145 lb 3.2 oz (65.9 kg)  05/27/18 153 lb 11.2 oz (69.7 kg)    Physical Exam    Constitutional: She is oriented to person, place, and time. She appears well-developed and well-nourished.  HENT:  Head: Normocephalic and atraumatic.  Eyes: Conjunctivae and EOM are normal.  Neck: Normal range of motion.  Cardiovascular: Normal rate, regular rhythm and normal heart sounds.  Pulmonary/Chest: Effort normal and breath sounds normal.  Abdominal: Soft.  Very active bowel sounds  Musculoskeletal: Normal range of motion.  Neurological: She is alert and oriented to person, place, and time.  Skin: No erythema.  Psychiatric: She has a normal mood and affect. Her behavior is normal. Judgment and thought content normal.    Results for orders placed or performed in visit on 09/16/18  CBC with Differential/Platelet  Result Value Ref Range   WBC 6.3 3.4 - 10.8 x10E3/uL   RBC 4.29 3.77 - 5.28 x10E6/uL   Hemoglobin 11.8 11.1 - 15.9 g/dL   Hematocrit 36.5 34.0 - 46.6 %   MCV 85 79 - 97 fL   MCH 27.5 26.6 - 33.0 pg   MCHC 32.3 31.5 - 35.7 g/dL   RDW 14.6 12.3 - 15.4 %   Platelets 321 150 - 450 x10E3/uL   Neutrophils 68 Not Estab. %   Lymphs 18 Not Estab. %   Monocytes 7 Not Estab. %  Eos 6 Not Estab. %   Basos 1 Not Estab. %   Neutrophils Absolute 4.3 1.4 - 7.0 x10E3/uL   Lymphocytes Absolute 1.2 0.7 - 3.1 x10E3/uL   Monocytes Absolute 0.4 0.1 - 0.9 x10E3/uL   EOS (ABSOLUTE) 0.4 0.0 - 0.4 x10E3/uL   Basophils Absolute 0.0 0.0 - 0.2 x10E3/uL   Immature Granulocytes 0 Not Estab. %   Immature Grans (Abs) 0.0 0.0 - 0.1 x10E3/uL  Comprehensive metabolic panel  Result Value Ref Range   Glucose 87 65 - 99 mg/dL   BUN 12 8 - 27 mg/dL   Creatinine, Ser 0.64 0.57 - 1.00 mg/dL   GFR calc non Af Amer 94 >59 mL/min/1.73   GFR calc Af Amer 108 >59 mL/min/1.73   BUN/Creatinine Ratio 19 12 - 28   Sodium 145 (H) 134 - 144 mmol/L   Potassium 3.6 3.5 - 5.2 mmol/L   Chloride 102 96 - 106 mmol/L   CO2 27 20 - 29 mmol/L   Calcium 9.3 8.7 - 10.3 mg/dL   Total Protein 7.2 6.0 - 8.5 g/dL    Albumin 4.3 3.6 - 4.8 g/dL   Globulin, Total 2.9 1.5 - 4.5 g/dL   Albumin/Globulin Ratio 1.5 1.2 - 2.2   Bilirubin Total 0.4 0.0 - 1.2 mg/dL   Alkaline Phosphatase 104 39 - 117 IU/L   AST 10 0 - 40 IU/L   ALT 10 0 - 32 IU/L  Lipid panel  Result Value Ref Range   Cholesterol, Total 156 100 - 199 mg/dL   Triglycerides 133 0 - 149 mg/dL   HDL 60 >39 mg/dL   VLDL Cholesterol Cal 27 5 - 40 mg/dL   LDL Calculated 69 0 - 99 mg/dL   Chol/HDL Ratio 2.6 0.0 - 4.4 ratio  TSH  Result Value Ref Range   TSH 1.300 0.450 - 4.500 uIU/mL  Urinalysis, Routine w reflex microscopic  Result Value Ref Range   Specific Gravity, UA 1.020 1.005 - 1.030   pH, UA 5.5 5.0 - 7.5   Color, UA Orange Yellow   Appearance Ur Hazy (A) Clear   Leukocytes, UA Negative Negative   Protein, UA Trace (A) Negative/Trace   Glucose, UA Negative Negative   Ketones, UA Trace (A) Negative   RBC, UA Negative Negative   Bilirubin, UA Negative Negative   Urobilinogen, Ur 1.0 0.2 - 1.0 mg/dL   Nitrite, UA Negative Negative  QuantiFERON-TB Gold Plus  Result Value Ref Range   QuantiFERON Incubation Incubation performed.    QuantiFERON Criteria Comment    QuantiFERON TB1 Ag Value 0.01 IU/mL   QuantiFERON TB2 Ag Value 0.01 IU/mL   QuantiFERON Nil Value 0.01 IU/mL   QuantiFERON Mitogen Value 5.85 IU/mL   QuantiFERON-TB Gold Plus Negative Negative  Magnesium  Result Value Ref Range   Magnesium 2.2 1.6 - 2.3 mg/dL  Vitamin B1  Result Value Ref Range   Thiamine 110.6 66.5 - 200.0 nmol/L  Ferritin  Result Value Ref Range   Ferritin 32 15 - 150 ng/mL  Copper, Free  Result Value Ref Range   Copper, Free 790 mcg/L  Vitamin E  Result Value Ref Range   Vitamin E (Alpha Tocopherol) 9.8 9.0 - 29.0 mg/L   Vitamin E(Gamma Tocopherol) 2.1 0.5 - 4.9 mg/L  PTH, intact (no Ca)  Result Value Ref Range   PTH 43 15 - 65 pg/mL  Zinc  Result Value Ref Range   Zinc 99 56 - 134 ug/dL  Vitamin A  Result  Value Ref Range   Vitamin  A 29.5 22.0 - 69.5 ug/dL  Iron and TIBC  Result Value Ref Range   Total Iron Binding Capacity 277 250 - 450 ug/dL   UIBC 220 118 - 369 ug/dL   Iron 57 27 - 139 ug/dL   Iron Saturation 21 15 - 55 %  Hgb A1c w/o eAG  Result Value Ref Range   Hgb A1c MFr Bld 6.0 (H) 4.8 - 5.6 %  Vitamin K1, Serum  Result Value Ref Range   VITAMIN K1 0.16 0.13 - 1.88 ng/mL      Assessment & Plan:   Problem List Items Addressed This Visit      Other   Diarrhea    Discussed ongoing diarrhea discussed elimination diets and use of Imodium.  Will refer to GI to further evaluate.      Relevant Orders   Ambulatory referral to Gastroenterology       Follow up plan: Return if symptoms worsen or fail to improve, for As scheduled.

## 2018-10-14 DIAGNOSIS — F411 Generalized anxiety disorder: Secondary | ICD-10-CM | POA: Diagnosis not present

## 2018-10-17 DIAGNOSIS — F411 Generalized anxiety disorder: Secondary | ICD-10-CM | POA: Diagnosis not present

## 2018-10-21 DIAGNOSIS — F411 Generalized anxiety disorder: Secondary | ICD-10-CM | POA: Diagnosis not present

## 2018-10-29 DIAGNOSIS — F411 Generalized anxiety disorder: Secondary | ICD-10-CM | POA: Diagnosis not present

## 2018-11-04 DIAGNOSIS — F411 Generalized anxiety disorder: Secondary | ICD-10-CM | POA: Diagnosis not present

## 2018-11-07 ENCOUNTER — Ambulatory Visit (INDEPENDENT_AMBULATORY_CARE_PROVIDER_SITE_OTHER): Payer: Medicare Other | Admitting: Gastroenterology

## 2018-11-07 ENCOUNTER — Encounter

## 2018-11-07 ENCOUNTER — Ambulatory Visit: Payer: BC Managed Care – PPO | Admitting: Gastroenterology

## 2018-11-07 ENCOUNTER — Encounter: Payer: Self-pay | Admitting: Gastroenterology

## 2018-11-07 VITALS — BP 111/70 | HR 81 | Ht 62.0 in | Wt 149.6 lb

## 2018-11-07 DIAGNOSIS — R197 Diarrhea, unspecified: Secondary | ICD-10-CM | POA: Diagnosis not present

## 2018-11-07 NOTE — Addendum Note (Signed)
Addended by: Dorethea Clan on: 11/07/2018 03:05 PM   Modules accepted: Orders

## 2018-11-07 NOTE — Progress Notes (Signed)
Jonathon Bellows MD, MRCP(U.K) [address]  [place]  [place], [place] [address]  Main: [phone]  Fax: [phone]   Primary Care Physician: Guadalupe Maple, MD  Primary Gastroenterologist:  Dr. Jonathon Bellows   Chief Complaint  Patient presents with  . Diarrhea    Gas, Bloating    HPI: Amanda Wang is a 67 y.o. female  She has been referred for diarrhea.   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 She expected the diarrhea to stop  But it dint     Number of bowel movements a day : since noon has had 3    Color : mostly clear- at times yellow   Consistency:  Watery  Present status: ongoing    Shape of stool:  Occasionally does when she takes imodium    Weight loss:  She did but has gained back slowly  Prior colonoscopy:  Yes - many years atleast 10   Artificial sugars/sodas/chewing gum:  She consumes soda : 2 cans a day , occasional sweet tea, no hard candy, no chewing gum    Bloating:  Yes   Gas:  Yes - feels gassy - afraid to pass gas thinks wil leak  Antibiotic use: yes cant recall name.     Current Outpatient Medications  Medication Sig Dispense Refill  . amLODipine (NORVASC) 5 MG tablet Take 1 tablet (5 mg total) by mouth daily. 90 tablet 4  . atorvastatin (LIPITOR) 40 MG tablet Take 1 tablet (40 mg total) by mouth daily at 6 PM. 90 tablet 4  . benazepril (LOTENSIN) 20 MG tablet Take 1 tablet (20 mg total) by mouth daily. 90 tablet 4  . cyclobenzaprine (FLEXERIL) 10 MG tablet take 1 tablet by mouth three times a day if  needed for muscle spasm 90 tablet 4  . gabapentin (NEURONTIN) 300 MG capsule Take 1 capsule (300 mg total) by mouth 3 (three) times daily. 180 capsule 4  . hydrochlorothiazide (HYDRODIURIL) 25 MG tablet Take 1 tablet (25 mg total) by mouth daily. 90 tablet 4  . levETIRAcetam (KEPPRA) 500 MG tablet Take 1 tablet (500 mg total) by mouth daily. 90 tablet 4  . LORazepam (ATIVAN) 1 MG tablet Take 0.5-1 tablets (0.5-1 mg total) by mouth daily as needed for anxiety. 30 tablet 0  . montelukast (SINGULAIR) 10 MG tablet Take 1 tablet (10 mg total) by mouth at bedtime. 90 tablet 4  . Vilazodone HCl (VIIBRYD) 20 MG TABS Take 1 tablet (20 mg total) by mouth daily. 90 tablet 4   No current facility-administered medications for this visit.     Allergies as of 11/07/2018 - Review Complete 10/07/2018  Allergen Reaction Noted  . Fexofenadine Anxiety 11/04/2016  . Codeine  07/15/2015  . Methylphenidate hcl  07/24/2018  . Zoloft [sertraline]  07/15/2015  . Magnevist  [gadopentetate] Other (See Comments) 07/27/2015  . Sulfa antibiotics Rash 07/15/2015    ROS:  General: Negative for anorexia, weight loss, fever, chills, fatigue, weakness. ENT: Negative for hoarseness, difficulty swallowing ,  nasal congestion. CV: Negative for chest pain, angina, palpitations, dyspnea on exertion, peripheral edema.  Respiratory: Negative for dyspnea at rest, dyspnea on exertion, cough, sputum, wheezing.  GI: See history of present illness. GU:  Negative for dysuria, hematuria, urinary incontinence, urinary frequency, nocturnal urination.  Endo: Negative for unusual weight change.    Physical Examination:   BP 111/70   Pulse 81   Ht 5\' 2"  (1.575 m)   Wt 149 lb 9.6 oz (67.9 kg)   BMI 27.36 kg/m   General: Well-nourished, well-developed in no acute distress.  Eyes: No icterus. Conjunctivae pink. Mouth: Oropharyngeal mucosa moist and pink , no lesions erythema or exudate. Lungs: Clear to auscultation bilaterally.  Non-labored. Heart: Regular rate and rhythm, no murmurs rubs or gallops.  Abdomen: Bowel sounds are normal, nontender, nondistended, no hepatosplenomegaly or masses, no abdominal bruits or hernia , no rebound or guarding.   Extremities: No lower extremity edema. No clubbing or deformities. Neuro: Alert and oriented x 3.  Grossly intact. Skin: Warm and dry, no jaundice.   Psych: Alert and cooperative, normal mood and affect.   Imaging Studies: No results found.  Assessment and Plan:   Amanda Wang is a 67 y.o. y/o female for diarrhea since may 2019 . Due for screening colonoscopy   Plan  1. I plan to rule out infection with stool tests , if negative will proceed with a colonoscopy .   2. Stop all sodas as fructose in it can cause an osmotic diarrhea 3. Trial of CREON samples provided 4. Colonoscopy will be needed for screening or for evaluation of diarrhea- next visit will discuss    Dr Jonathon Bellows  MD,MRCP [facility]) Follow up in 2 weeks

## 2018-11-08 LAB — CELIAC DISEASE AB SCREEN W/RFX
ANTIGLIADIN ABS, IGA: 5 U (ref 0–19)
IGA/IMMUNOGLOBULIN A, SERUM: 300 mg/dL (ref 87–352)

## 2018-11-10 DIAGNOSIS — R197 Diarrhea, unspecified: Secondary | ICD-10-CM | POA: Diagnosis not present

## 2018-11-11 ENCOUNTER — Encounter: Payer: Self-pay | Admitting: Gastroenterology

## 2018-11-11 ENCOUNTER — Encounter: Payer: Medicare Other | Admitting: Family Medicine

## 2018-11-12 LAB — CLOSTRIDIUM DIFFICILE BY PCR: Toxigenic C. Difficile by PCR: NEGATIVE

## 2018-11-14 LAB — GI PROFILE, STOOL, PCR
ADENOVIRUS F 40/41: NOT DETECTED
Astrovirus: NOT DETECTED
C difficile toxin A/B: NOT DETECTED
CRYPTOSPORIDIUM: NOT DETECTED
CYCLOSPORA CAYETANENSIS: NOT DETECTED
Campylobacter: NOT DETECTED
ENTAMOEBA HISTOLYTICA: NOT DETECTED
ENTEROPATHOGENIC E COLI: NOT DETECTED
Enteroaggregative E coli: NOT DETECTED
Enterotoxigenic E coli: NOT DETECTED
GIARDIA LAMBLIA: NOT DETECTED
NOROVIRUS GI/GII: NOT DETECTED
Plesiomonas shigelloides: NOT DETECTED
ROTAVIRUS A: NOT DETECTED
SAPOVIRUS: NOT DETECTED
SHIGELLA/ENTEROINVASIVE E COLI: NOT DETECTED
Salmonella: NOT DETECTED
Shiga-toxin-producing E coli: NOT DETECTED
Vibrio cholerae: NOT DETECTED
Vibrio: NOT DETECTED
Yersinia enterocolitica: NOT DETECTED

## 2018-11-14 LAB — CALPROTECTIN, FECAL: CALPROTECTIN, FECAL: 243 ug/g — AB (ref 0–120)

## 2018-11-18 DIAGNOSIS — F411 Generalized anxiety disorder: Secondary | ICD-10-CM | POA: Diagnosis not present

## 2018-11-21 ENCOUNTER — Ambulatory Visit (INDEPENDENT_AMBULATORY_CARE_PROVIDER_SITE_OTHER): Payer: Medicare Other | Admitting: Gastroenterology

## 2018-11-21 ENCOUNTER — Other Ambulatory Visit: Payer: Self-pay

## 2018-11-21 ENCOUNTER — Encounter: Payer: Self-pay | Admitting: Gastroenterology

## 2018-11-21 VITALS — BP 132/82 | HR 86 | Ht 62.0 in | Wt 150.0 lb

## 2018-11-21 DIAGNOSIS — K580 Irritable bowel syndrome with diarrhea: Secondary | ICD-10-CM

## 2018-11-21 DIAGNOSIS — R197 Diarrhea, unspecified: Secondary | ICD-10-CM

## 2018-11-21 MED ORDER — RIFAXIMIN 550 MG PO TABS: 550.0000 mg | ORAL_TABLET | Freq: Two times a day (BID) | ORAL | 0 refills | Status: AC

## 2018-11-21 NOTE — Progress Notes (Signed)
Jonathon Bellows MD, MRCP(U.K) [address]  [place]  [place], [place] [address]  Main: [phone]  Fax: [phone]   Primary Care Physician: Guadalupe Maple, MD  Primary Gastroenterologist:  Dr. Jonathon Bellows   No chief complaint on file.   HPI: Amanda Wang is a 67 y.o. female    Summary of history :  She is here today to follow-up to her initial visit when she was seen on 11/07/2018.  She was referred for diarrhea.  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  She has over 3 bowel movements a day.  Watery in nature.  She has taken Imodium occasionally.  No real change of weight.  Consumes 2 cans of soda a day occasional sweet tea no hard candy or chewing gum.  Is afraid to pass gas that she thought she would have leakage.  She had used a recent antibiotic.  Labs 08/2018 : CBC, CMP,TSH,copper, vitamin E,Zinc , vitamin A,iron studies-normal ,   Interval history 11/07/2018 to 11/21/2018  11/10/2018: Fecal calprotectin elevated at 243.  Stool studies PCR was negative.  Stool testing for C. difficile was negative.  Celiac serology negative.  Diarrhea and gas still persists despite quitting all sodas.  CREON probably did not help much.   Bloating still severe.   Current Outpatient Medications  Medication Sig Dispense Refill  . amLODipine (NORVASC) 5 MG tablet Take 1 tablet (5 mg total) by mouth daily. 90 tablet 4  . atorvastatin (LIPITOR) 40 MG tablet Take 1 tablet (40 mg total) by mouth daily at 6 PM. 90 tablet 4  . benazepril (LOTENSIN) 20 MG tablet Take 1 tablet (20 mg total) by mouth daily. 90 tablet 4  . cyclobenzaprine (FLEXERIL) 10 MG tablet take 1 tablet by mouth three times a day if needed for muscle spasm 90 tablet 4  . gabapentin (NEURONTIN) 300 MG capsule Take 1 capsule (300 mg total) by mouth 3 (three) times daily. 180 capsule 4  . hydrochlorothiazide (HYDRODIURIL) 25 MG tablet Take 1 tablet (25 mg total) by mouth daily. 90 tablet 4  .  levETIRAcetam (KEPPRA) 500 MG tablet Take 1 tablet (500 mg total) by mouth daily. 90 tablet 4  . LORazepam (ATIVAN) 1 MG tablet Take 0.5-1 tablets (0.5-1 mg total) by mouth daily as needed for anxiety. 30 tablet 0  . montelukast (SINGULAIR) 10 MG tablet Take 1 tablet (10 mg total) by mouth at bedtime. 90 tablet 4  . Vilazodone HCl (VIIBRYD) 20 MG TABS Take 1 tablet (20 mg total) by mouth daily. 90 tablet 4   No current facility-administered medications for this visit.     Allergies as of 11/21/2018 - Review Complete 11/07/2018  Allergen Reaction Noted  . Fexofenadine Anxiety 11/04/2016  . Codeine  07/15/2015  . Methylphenidate hcl  07/24/2018  . Zoloft [sertraline]  07/15/2015  . Magnevist  [gadopentetate] Other (See Comments) 07/27/2015  . Sulfa antibiotics Rash 07/15/2015    ROS:  General: Negative for anorexia, weight loss, fever, chills, fatigue, weakness. ENT: Negative for hoarseness, difficulty swallowing , nasal congestion. CV: Negative for chest pain, angina, palpitations, dyspnea on exertion, peripheral edema.  Respiratory: Negative for dyspnea at rest, dyspnea on exertion, cough, sputum, wheezing.  GI: See history of present illness. GU:  Negative for dysuria, hematuria, urinary incontinence, urinary frequency, nocturnal urination.  Endo: Negative for unusual weight change.    Physical Examination:   There were no vitals taken for this visit.  General: Well-nourished,  well-developed in no acute distress.  Eyes: No icterus. Conjunctivae pink. Mouth: Oropharyngeal mucosa moist and pink , no lesions erythema or exudate. Lungs: Clear to auscultation bilaterally. Non-labored. Heart: Regular rate and rhythm, no murmurs rubs or gallops.  Abdomen: Bowel sounds are normal, nontender, nondistended, no hepatosplenomegaly or masses, no abdominal bruits or hernia , no rebound or guarding.   Extremities: No lower extremity edema. No clubbing or deformities. Neuro: Alert and  oriented x 3.  Grossly intact. Skin: Warm and dry, no jaundice.   Psych: Alert and cooperative, normal mood and affect.   Imaging Studies: No results found.  Assessment and Plan:   Amanda Wang is a 68 y.o. y/o female here to follow up for  diarrhea since may 2019 . Possible IBS-D.   Plan  1. colonoscopy  To screen for microscopic colitis    2. Xifaxan for 2 weeks for IBS-D   I have discussed alternative options, risks & benefits,  which include, but are not limited to, bleeding, infection, perforation,respiratory complication & drug reaction.  The patient agrees with this plan & written consent will be obtained.     Dr Jonathon Bellows  MD,MRCP [facility]) Follow up in 4 weeks

## 2018-11-22 ENCOUNTER — Telehealth: Payer: Self-pay | Admitting: Gastroenterology

## 2018-11-22 NOTE — Telephone Encounter (Signed)
Pt left vm she saw Dr. Vicente Males today and hge called in rx for her for 1 pill 2 times a day and the pharmacy only gave her 20 pills instead of 28 pills please call pt

## 2018-11-22 NOTE — Telephone Encounter (Signed)
Spoke with pt and informed her that we've contacted her pharmacy to prescribe the 8 additional Xifaxan pills to fulfill the treatment plan.

## 2018-11-26 ENCOUNTER — Telehealth: Payer: Self-pay | Admitting: Gastroenterology

## 2018-11-26 NOTE — Telephone Encounter (Signed)
Spoke with pt regarding her concern about having a cold. Pt states she's experiencing mild nasal congestion which improves with allergy medication. I explained that she's okay to have the colonoscopy procedure as long as she has not developed a cough, fever, chest congestion or trouble breathing.

## 2018-11-26 NOTE — Telephone Encounter (Signed)
Patient Amanda Wang that she has developed a cold. She has a procedure on Thursday w/ Dr. Vicente Males, is she ok to have her procedure.

## 2018-11-28 ENCOUNTER — Encounter: Payer: Self-pay | Admitting: Student

## 2018-11-28 ENCOUNTER — Encounter
Admission: RE | Disposition: A | Discharge status: Home / Self Care | Payer: Self-pay | Source: Ambulatory Visit | Attending: Gastroenterology

## 2018-11-28 ENCOUNTER — Ambulatory Visit
Admission: RE | Admit: 2018-11-28 | Discharge: 2018-11-28 | Disposition: A | Discharge status: Home / Self Care | Payer: Medicare Other | Source: Ambulatory Visit | Attending: Gastroenterology | Admitting: Gastroenterology

## 2018-11-28 ENCOUNTER — Ambulatory Visit: Payer: Medicare Other | Admitting: Anesthesiology

## 2018-11-28 ENCOUNTER — Other Ambulatory Visit: Payer: Self-pay

## 2018-11-28 DIAGNOSIS — K580 Irritable bowel syndrome with diarrhea: Secondary | ICD-10-CM | POA: Diagnosis not present

## 2018-11-28 DIAGNOSIS — E785 Hyperlipidemia, unspecified: Secondary | ICD-10-CM | POA: Diagnosis not present

## 2018-11-28 DIAGNOSIS — Z79899 Other long term (current) drug therapy: Secondary | ICD-10-CM | POA: Diagnosis not present

## 2018-11-28 DIAGNOSIS — Z87891 Personal history of nicotine dependence: Secondary | ICD-10-CM | POA: Diagnosis not present

## 2018-11-28 DIAGNOSIS — F329 Major depressive disorder, single episode, unspecified: Secondary | ICD-10-CM | POA: Insufficient documentation

## 2018-11-28 DIAGNOSIS — K635 Polyp of colon: Secondary | ICD-10-CM | POA: Diagnosis not present

## 2018-11-28 DIAGNOSIS — K5140 Inflammatory polyps of colon without complications: Secondary | ICD-10-CM | POA: Diagnosis not present

## 2018-11-28 DIAGNOSIS — D123 Benign neoplasm of transverse colon: Secondary | ICD-10-CM

## 2018-11-28 DIAGNOSIS — F419 Anxiety disorder, unspecified: Secondary | ICD-10-CM | POA: Diagnosis not present

## 2018-11-28 DIAGNOSIS — D126 Benign neoplasm of colon, unspecified: Secondary | ICD-10-CM | POA: Diagnosis not present

## 2018-11-28 DIAGNOSIS — R197 Diarrhea, unspecified: Secondary | ICD-10-CM | POA: Insufficient documentation

## 2018-11-28 DIAGNOSIS — I10 Essential (primary) hypertension: Secondary | ICD-10-CM | POA: Insufficient documentation

## 2018-11-28 HISTORY — DX: Personal history of other diseases of the digestive system: Z87.19

## 2018-11-28 HISTORY — PX: COLONOSCOPY WITH PROPOFOL: SHX5780

## 2018-11-28 SURGERY — COLONOSCOPY WITH PROPOFOL
Anesthesia: General

## 2018-11-28 MED ORDER — RIFAXIMIN 550 MG PO TABS: 550.0000 mg | ORAL_TABLET | Freq: Two times a day (BID) | ORAL | 0 refills | Status: AC

## 2018-11-28 MED ORDER — ONDANSETRON HCL 4 MG/2ML IJ SOLN
INTRAMUSCULAR | Status: DC | PRN
  Administered 2018-11-28: 4 mg via INTRAVENOUS

## 2018-11-28 MED ORDER — FENTANYL CITRATE (PF) 100 MCG/2ML IJ SOLN
INTRAMUSCULAR | Status: DC | PRN
  Administered 2018-11-28 (×4): 25 ug via INTRAVENOUS

## 2018-11-28 MED ORDER — PROPOFOL 500 MG/50ML IV EMUL
INTRAVENOUS | Status: DC | PRN
  Administered 2018-11-28: 75 ug/kg/min via INTRAVENOUS

## 2018-11-28 MED ORDER — SODIUM CHLORIDE 0.9 % IV SOLN
INTRAVENOUS | Status: DC
  Administered 2018-11-28: 09:00:00 via INTRAVENOUS

## 2018-11-28 MED ORDER — MIDAZOLAM HCL 2 MG/2ML IJ SOLN
INTRAMUSCULAR | Status: DC | PRN
  Administered 2018-11-28 (×2): 1 mg via INTRAVENOUS

## 2018-11-28 MED ORDER — MIDAZOLAM HCL 2 MG/2ML IJ SOLN
INTRAMUSCULAR | Status: AC
  Filled 2018-11-28: qty 2

## 2018-11-28 MED ORDER — FENTANYL CITRATE (PF) 100 MCG/2ML IJ SOLN
INTRAMUSCULAR | Status: AC
  Filled 2018-11-28: qty 2

## 2018-11-28 MED ORDER — PHENYLEPHRINE HCL 10 MG/ML IJ SOLN
INTRAMUSCULAR | Status: DC | PRN
  Administered 2018-11-28 (×2): 100 ug via INTRAVENOUS

## 2018-11-28 NOTE — H&P (Signed)
Jonathon Bellows, MD [address] [place], [place], [place], [address] [address], [place], [place], [place], [address] Phone: [phone]  Fax: [phone]  Primary Care Physician:  Guadalupe Maple, MD   Pre-Procedure History & Physical: HPI:  Amanda Wang is a 67 y.o. female is here for an colonoscopy.   Past Medical History:  Diagnosis Date  . Arthritis   . Chiari malformation type I ([place])   . Depression   . GERD (gastroesophageal reflux disease)   . History of hiatal hernia   . Hyperlipidemia   . Hypertension   . Sleep apnea   . Spinal stenosis     Past Surgical History:  Procedure Laterality Date  . BREAST BIOPSY Right    neg  . chiari malformation I decompression surgery  11-06  . deviated septum repair    . HERNIA REPAIR      Prior to Admission medications   Medication Sig Start Date End Date Taking? Authorizing Provider  amLODipine (NORVASC) 5 MG tablet Take 1 tablet (5 mg total) by mouth daily. 09/16/18  Yes Crissman, Jeannette How, MD  atorvastatin (LIPITOR) 40 MG tablet Take 1 tablet (40 mg total) by mouth daily at 6 PM. 09/16/18  Yes Crissman, Jeannette How, MD  benazepril (LOTENSIN) 20 MG tablet Take 1 tablet (20 mg total) by mouth daily. 09/16/18  Yes Guadalupe Maple, MD  cyclobenzaprine (FLEXERIL) 10 MG tablet take 1 tablet by mouth three times a day if needed for muscle spasm 09/16/18  Yes Crissman, Jeannette How, MD  gabapentin (NEURONTIN) 300 MG capsule Take 1 capsule (300 mg total) by mouth 3 (three) times daily. 09/16/18  Yes Crissman, Jeannette How, MD  hydrochlorothiazide (HYDRODIURIL) 25 MG tablet Take 1 tablet (25 mg total) by mouth daily. 09/16/18  Yes Crissman, Jeannette How, MD  levETIRAcetam (KEPPRA) 500 MG tablet Take 1 tablet (500 mg total) by mouth daily. 09/16/18  Yes Crissman, Jeannette How, MD  LORazepam (ATIVAN) 1 MG tablet Take 0.5-1 tablets (0.5-1 mg total) by mouth daily as needed for anxiety. 04/02/18  Yes Kathrine Haddock, NP  montelukast (SINGULAIR) 10 MG tablet Take  1 tablet (10 mg total) by mouth at bedtime. 09/16/18 12/15/18 Yes Crissman, Jeannette How, MD  rifaximin (XIFAXAN) 550 MG TABS tablet Take 1 tablet (550 mg total) by mouth 2 (two) times daily for 14 days. 11/21/18 12/05/18 Yes Jonathon Bellows, MD  Vilazodone HCl (VIIBRYD) 20 MG TABS Take 1 tablet (20 mg total) by mouth daily. 09/18/18  Yes Guadalupe Maple, MD    Allergies as of 11/21/2018 - Review Complete 11/21/2018  Allergen Reaction Noted  . Fexofenadine Anxiety 11/04/2016  . Codeine  07/15/2015  . Methylphenidate hcl  07/24/2018  . Zoloft [sertraline]  07/15/2015  . Magnevist  [gadopentetate] Other (See Comments) 07/27/2015  . Sulfa antibiotics Rash 07/15/2015    Family History  Problem Relation Age of Onset  . Hypertension Mother   . Cancer Mother   . Stroke Mother   . Heart disease Father   . Breast cancer Sister 12  . Breast cancer Paternal Aunt     Social History   Socioeconomic History  . Marital status: Divorced    Spouse name: Not on file  . Number of children: Not on file  . Years of education: Not on file  . Highest education level: Not on file  Occupational History  . Not on file  Social Needs  . Financial resource strain: Not on file  .  Food insecurity:    Worry: Not on file    Inability: Not on file  . Transportation needs:    Medical: Not on file    Non-medical: Not on file  Tobacco Use  . Smoking status: Former Smoker    Types: Cigarettes    Last attempt to quit: 07/26/1977    Years since quitting: 41.3  . Smokeless tobacco: Never Used  Substance and Sexual Activity  . Alcohol use: Yes    Alcohol/week: 0.0 standard drinks    Comment: on occasion  . Drug use: No  . Sexual activity: Not on file  Lifestyle  . Physical activity:    Days per week: Not on file    Minutes per session: Not on file  . Stress: Not on file  Relationships  . Social connections:    Talks on phone: Not on file    Gets together: Not on file    Attends religious service: Not on file     Active member of club or organization: Not on file    Attends meetings of clubs or organizations: Not on file    Relationship status: Not on file  . Intimate partner violence:    Fear of current or ex partner: Not on file    Emotionally abused: Not on file    Physically abused: Not on file    Forced sexual activity: Not on file  Other Topics Concern  . Not on file  Social History Narrative  . Not on file    Review of Systems: See HPI, otherwise negative ROS  Physical Exam: BP 119/78   Pulse 82   Temp 98.4 F (36.9 C) (Tympanic)   Resp 17   Ht 5\' 2"  (1.575 m)   Wt 65.8 kg   SpO2 100%   BMI 26.52 kg/m  General:   Alert,  pleasant and cooperative in NAD Head:  Normocephalic and atraumatic. Neck:  Supple; no masses or thyromegaly. Lungs:  Clear throughout to auscultation, normal respiratory effort.    Heart:  +S1, +S2, Regular rate and rhythm, No edema. Abdomen:  Soft, nontender and nondistended. Normal bowel sounds, without guarding, and without rebound.   Neurologic:  Alert and  oriented x4;  grossly normal neurologically.  Impression/Plan: Amanda Wang is here for an colonoscopy to be performed for diarrhea   Risks, benefits, limitations, and alternatives regarding  colonoscopy have been reviewed with the patient.  Questions have been answered.  All parties agreeable.   Jonathon Bellows, MD  11/28/2018, 10:08 AM

## 2018-11-28 NOTE — Transfer of Care (Signed)
Immediate Anesthesia Transfer of Care Note  Patient: Amanda Wang  Procedure(s) Performed: COLONOSCOPY WITH PROPOFOL (N/A )  Patient Location: PACU  Anesthesia Type:General  Level of Consciousness: awake, alert  and oriented  Airway & Oxygen Therapy: Patient Spontanous Breathing and Patient connected to nasal cannula oxygen  Post-op Assessment: Report given to RN and Post -op Vital signs reviewed and stable  Post vital signs: Reviewed and stable  Last Vitals:  Vitals Value Taken Time  BP 91/53 11/28/2018 10:48 AM  Temp 36.1 C 11/28/2018 10:48 AM  Pulse 75 11/28/2018 10:50 AM  Resp 11 11/28/2018 10:50 AM  SpO2 99 % 11/28/2018 10:50 AM  Vitals shown include unvalidated device data.  Last Pain:  Vitals:   11/28/18 1048  TempSrc: Tympanic  PainSc: Asleep         Complications: No apparent anesthesia complications

## 2018-11-28 NOTE — Anesthesia Postprocedure Evaluation (Signed)
Anesthesia Post Note  Patient: Amanda Wang  Procedure(s) Performed: COLONOSCOPY WITH PROPOFOL (N/A )  Patient location during evaluation: Endoscopy Anesthesia Type: General Level of consciousness: awake and alert and oriented Pain management: pain level controlled Vital Signs Assessment: post-procedure vital signs reviewed and stable Respiratory status: spontaneous breathing, nonlabored ventilation and respiratory function stable Cardiovascular status: blood pressure returned to baseline and stable Postop Assessment: no signs of nausea or vomiting Anesthetic complications: no     Last Vitals:  Vitals:   11/28/18 1108 11/28/18 1118  BP: 117/71 115/71  Pulse: 77 79  Resp: 13 16  Temp:    SpO2: 99% 100%    Last Pain:  Vitals:   11/28/18 1118  TempSrc:   PainSc: 0-No pain                 Marc Leichter

## 2018-11-28 NOTE — Op Note (Signed)
[facility] Gastroenter[facility] Patient Name: Amanda Wang Procedure Date: 11/28/2018 10:12 AM MRN: [id number] Account #: [id number] Date of Birth: 15-Jan-1951 Admit Type: Outpatient Age: 67 Room: [facility] ENDO ROOM 4 Gender: Female Note Status: Finalized Procedure:            Colonoscopy Indications:          Clinically significant diarrhea of unexplained origin Providers:            Jonathon Bellows MD, MD Referring MD:         Guadalupe Maple, MD (Referring MD) Medicines:            Monitored Anesthesia Care Complications:        No immediate complications. Procedure:            Pre-Anesthesia Assessment:                       - Prior to the procedure, a History and Physical was                        performed, and patient medications, allergies and                        sensitivities were reviewed. The patient's tolerance of                        previous anesthesia was reviewed.                       - The risks and benefits of the procedure and the                        sedation options and risks were discussed with the                        patient. All questions were answered and informed                        consent was obtained.                       - After reviewing the risks and benefits, the patient                        was deemed in satisfactory condition to undergo the                        procedure.                       - ASA Grade Assessment: II - A patient with mild                        systemic disease.                       After obtaining informed consent, the colonoscope was                        passed under direct vision. Throughout the procedure,  the patient's blood pressure, pulse, and oxygen                        saturations were monitored continuously. The                        Colonoscope was introduced through the anus and                        advanced to the the cecum, identified by the             appendiceal orifice, IC valve and transillumination.                        The colonoscopy was performed with ease. The patient                        tolerated the procedure well. The quality of the bowel                        preparation was good. Findings:      The perianal and digital rectal examinations were normal.      A 3 mm polyp was found in the transverse colon. The polyp was sessile.       The polyp was removed with a cold biopsy forceps. Resection and       retrieval were complete.      The terminal ileum appeared normal.      Normal mucosa was found in the entire colon. Biopsies for histology were       taken with a cold forceps from the entire colon for evaluation of       microscopic colitis.      The exam was otherwise without abnormality on direct and retroflexion       views. Impression:           - One 3 mm polyp in the transverse colon, removed with                        a cold biopsy forceps. Resected and retrieved.                       - The examined portion of the ileum was normal.                       - Normal mucosa in the entire examined colon. Biopsied.                       - The examination was otherwise normal on direct and                        retroflexion views. Recommendation:       - Discharge patient to home (with escort).                       - Resume previous diet.                       - Continue present medications.                       - Await pathology results.                       -  Repeat colonoscopy for surveillance based on                        pathology results.                       - Return to GI office in 4 weeks. Procedure Code(s):    --- Professional ---                       [phone], Colonoscopy, flexible; with biopsy, single or                        multiple Diagnosis Code(s):    --- Professional ---                       D12.3, Benign neoplasm of transverse colon (hepatic                        flexure or  splenic flexure)                       R19.7, Diarrhea, unspecified CPT copyright 2018 American Medical Association. All rights reserved. The codes documented in this report are preliminary and upon coder review may  be revised to meet current compliance requirements. Jonathon Bellows, MD Jonathon Bellows MD, MD 11/28/2018 10:46:31 AM This report has been signed electronically. Number of Addenda: 0 Note Initiated On: 11/28/2018 10:12 AM Scope Withdrawal Time: 0 hours 16 minutes 43 seconds  Total Procedure Duration: 0 hours 23 minutes 6 seconds       [facility]

## 2018-11-28 NOTE — Anesthesia Preprocedure Evaluation (Signed)
Anesthesia Evaluation  Patient identified by MRN, date of birth, ID band Patient awake    Reviewed: Allergy & Precautions, NPO status , Patient's Chart, lab work & pertinent test results  History of Anesthesia Complications Negative for: history of anesthetic complications  Airway Mallampati: II  TM Distance: >3 FB Neck ROM: Full    Dental no notable dental hx.    Pulmonary neg sleep apnea, neg COPD, former smoker,    breath sounds clear to auscultation- rhonchi (-) wheezing      Cardiovascular hypertension, Pt. on medications (-) CAD, (-) Past MI, (-) Cardiac Stents and (-) CABG  Rhythm:Regular Rate:Normal - Systolic murmurs and - Diastolic murmurs    Neuro/Psych neg Seizures PSYCHIATRIC DISORDERS Anxiety Depression negative neurological ROS     GI/Hepatic Neg liver ROS, hiatal hernia, GERD  ,  Endo/Other  negative endocrine ROSneg diabetes  Renal/GU negative Renal ROS     Musculoskeletal  (+) Arthritis ,   Abdominal (+) - obese,   Peds  Hematology negative hematology ROS (+)   Anesthesia Other Findings Past Medical History: No date: Arthritis No date: Chiari malformation type I ([place]) No date: Depression No date: GERD (gastroesophageal reflux disease) No date: History of hiatal hernia No date: Hyperlipidemia No date: Hypertension No date: Sleep apnea No date: Spinal stenosis   Reproductive/Obstetrics                             Anesthesia Physical Anesthesia Plan  ASA: II  Anesthesia Plan: General   Post-op Pain Management:    Induction: Intravenous  PONV Risk Score and Plan: 2 and Propofol infusion  Airway Management Planned: Natural Airway  Additional Equipment:   Intra-op Plan:   Post-operative Plan:   Informed Consent: I have reviewed the patients History and Physical, chart, labs and discussed the procedure including the risks, benefits and alternatives for the  proposed anesthesia with the patient or authorized representative who has indicated his/her understanding and acceptance.   Dental advisory given  Plan Discussed with: CRNA and Anesthesiologist  Anesthesia Plan Comments:         Anesthesia Quick Evaluation

## 2018-11-28 NOTE — Anesthesia Post-op Follow-up Note (Signed)
Anesthesia QCDR form completed.        

## 2018-11-29 LAB — SURGICAL PATHOLOGY

## 2018-12-03 ENCOUNTER — Telehealth: Payer: Self-pay

## 2018-12-03 NOTE — Telephone Encounter (Signed)
-----   Message from Jonathon Bellows, MD sent at 12/01/2018  4:12 PM EST ----- Sherald Hess inform biopsies of the colon all returned normal - follow up at office visit   C/c Crissman, Jeannette How, MD

## 2018-12-03 NOTE — Telephone Encounter (Signed)
Called pt to inform her of endoscopy biopsy results. Unable to contact. VM is full

## 2018-12-16 ENCOUNTER — Telehealth: Payer: Self-pay | Admitting: Gastroenterology

## 2018-12-16 NOTE — Telephone Encounter (Signed)
Pt  Is  Calling she had an endoscopy 12/12 and was put on rx thyfaxing she is on her 2nd round and is not scheduled for f/u apt until 01/20/2019 should she continue the rx until apt? She is not doing any better, and she will also be out of rx.  She would like to know should her f/u apt be closer then 2 month after her procedure she would like a call please

## 2018-12-20 NOTE — Telephone Encounter (Signed)
Spoke with pt regarding her questions about taking the Xifaxan and following up with Dr. Vicente Males. Pt states she's been taking the Xifaxan but her symptoms have not improved. Pt is requesting a sooner appointment. I explained to pt that we can move her appointment to 01-02-19. Pt agrees.

## 2018-12-31 DIAGNOSIS — F411 Generalized anxiety disorder: Secondary | ICD-10-CM | POA: Diagnosis not present

## 2019-01-02 ENCOUNTER — Ambulatory Visit (INDEPENDENT_AMBULATORY_CARE_PROVIDER_SITE_OTHER): Payer: Medicare Other | Admitting: Gastroenterology

## 2019-01-02 ENCOUNTER — Encounter: Payer: Self-pay | Admitting: Gastroenterology

## 2019-01-02 VITALS — BP 103/70 | HR 85 | Ht 62.0 in | Wt 143.4 lb

## 2019-01-02 DIAGNOSIS — K580 Irritable bowel syndrome with diarrhea: Secondary | ICD-10-CM

## 2019-01-02 NOTE — Progress Notes (Signed)
Amanda Bellows MD, MRCP(U.K) [address]  [place]  [place], [place] [address]  Main: [phone]  Fax: [phone]   Primary Care Physician: Amanda Maple, MD  Primary Gastroenterologist:  Dr. Jonathon Wang   Chief Complaint  Patient presents with  . Follow-up    Diarrhea, Abdominal pain, gas    HPI: Amanda Wang is a 68 y.o. female   Summary of history :  She is here today to follow-up to her initial visit when she was seen on 11/07/2018.  She was referred for diarrhea.  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  She has over 3 bowel movements a day.  Watery in nature.  She has taken Imodium occasionally.  No real change of weight.  Consumes 2 cans of soda a day occasional sweet tea no hard candy or chewing gum.  Is afraid to pass gas that she thought she would have leakage.  She had used a recent antibiotic.  Labs 08/2018 : CBC, CMP,TSH,copper, vitamin E,Zinc , vitamin A,iron studies-normal , 11/10/2018: Fecal calprotectin elevated at 243.  Stool studies PCR was negative.  Stool testing for C. difficile was negative.  Celiac serology negative.    Interval history 11/21/2018-01/02/2019  11/28/18 : colonoscopy :Normal TI and random colon biopsies .    Says the Xifaxan did work and most of her symptoms had settled . Once she completed it and went off the medications, when has a lot of gas has discomfort. , feels like a lot of bubbles in her abdomen that bubbles,when she has a bowel movement "like a fire works " with "pops"   Not on any sodas, no artificial sugars in her diet .   Current Outpatient Medications  Medication Sig Dispense Refill  . amLODipine (NORVASC) 5 MG tablet Take 1 tablet (5 mg total) by mouth daily. 90 tablet 4  . atorvastatin (LIPITOR) 40 MG tablet Take 1 tablet (40 mg total) by mouth daily at 6 PM. 90 tablet 4  . benazepril (LOTENSIN) 20 MG tablet Take 1 tablet (20 mg total) by mouth daily. 90 tablet 4  .  cyclobenzaprine (FLEXERIL) 10 MG tablet take 1 tablet by mouth three times a day if needed for muscle spasm 90 tablet 4  . gabapentin (NEURONTIN) 300 MG capsule Take 1 capsule (300 mg total) by mouth 3 (three) times daily. 180 capsule 4  . hydrochlorothiazide (HYDRODIURIL) 25 MG tablet Take 1 tablet (25 mg total) by mouth daily. 90 tablet 4  . levETIRAcetam (KEPPRA) 500 MG tablet Take 1 tablet (500 mg total) by mouth daily. 90 tablet 4  . LORazepam (ATIVAN) 1 MG tablet Take 0.5-1 tablets (0.5-1 mg total) by mouth daily as needed for anxiety. 30 tablet 0  . Vilazodone HCl (VIIBRYD) 20 MG TABS Take 1 tablet (20 mg total) by mouth daily. 90 tablet 4  . montelukast (SINGULAIR) 10 MG tablet Take 1 tablet (10 mg total) by mouth at bedtime. 90 tablet 4   No current facility-administered medications for this visit.     Allergies as of 01/02/2019 - Review Complete 01/02/2019  Allergen Reaction Noted  . Fexofenadine Anxiety 11/04/2016  . Codeine  07/15/2015  . Methylphenidate hcl  07/24/2018  . Zoloft [sertraline]  07/15/2015  . Magnevist  [gadopentetate] Other (See Comments) 07/27/2015  . Sulfa antibiotics Rash 07/15/2015    ROS:  General: Negative for anorexia, weight loss, fever, chills, fatigue, weakness. ENT: Negative for hoarseness, difficulty swallowing , nasal congestion. CV:  Negative for chest pain, angina, palpitations, dyspnea on exertion, peripheral edema.  Respiratory: Negative for dyspnea at rest, dyspnea on exertion, cough, sputum, wheezing.  GI: See history of present illness. GU:  Negative for dysuria, hematuria, urinary incontinence, urinary frequency, nocturnal urination.  Endo: Negative for unusual weight change.    Physical Examination:   BP 103/70   Pulse 85   Ht 5\' 2"  (1.575 m)   Wt 143 lb 6.4 oz (65 kg)   BMI 26.23 kg/m   General: Well-nourished, well-developed in no acute distress.  Eyes: No icterus. Conjunctivae pink. Mouth: Oropharyngeal mucosa moist and  pink , no lesions erythema or exudate. Lungs: Clear to auscultation bilaterally. Non-labored. Heart: Regular rate and rhythm, no murmurs rubs or gallops.  Abdomen: Bowel sounds are normal, nontender, nondistended, no hepatosplenomegaly or masses, no abdominal bruits or hernia , no rebound or guarding.   Extremities: No lower extremity edema. No clubbing or deformities. Neuro: Alert and oriented x 3.  Grossly intact. Skin: Warm and dry, no jaundice.   Psych: Alert and cooperative, normal mood and affect.   Imaging Studies: No results found.  Assessment and Plan:   Amanda Wang is a 68 y.o. y/o femalehere to follow up for  diarrhea since may 2019 . Possible IBS-D. Symptoms recurred after stopping antibiotics   Plan  1. Hydrogen breath test to evaluate for SIBO 2. Trial of activated charcoal 3. PRN imodium   Dr Amanda Bellows  MD,MRCP [facility]) Follow up in 4 weeks

## 2019-01-08 DIAGNOSIS — F411 Generalized anxiety disorder: Secondary | ICD-10-CM | POA: Diagnosis not present

## 2019-01-15 DIAGNOSIS — F411 Generalized anxiety disorder: Secondary | ICD-10-CM | POA: Diagnosis not present

## 2019-01-16 DIAGNOSIS — R634 Abnormal weight loss: Secondary | ICD-10-CM | POA: Diagnosis not present

## 2019-01-16 DIAGNOSIS — R195 Other fecal abnormalities: Secondary | ICD-10-CM | POA: Diagnosis not present

## 2019-01-16 DIAGNOSIS — F411 Generalized anxiety disorder: Secondary | ICD-10-CM | POA: Diagnosis not present

## 2019-01-20 ENCOUNTER — Ambulatory Visit: Payer: BC Managed Care – PPO | Admitting: Gastroenterology

## 2019-01-20 DIAGNOSIS — F411 Generalized anxiety disorder: Secondary | ICD-10-CM | POA: Diagnosis not present

## 2019-01-21 ENCOUNTER — Encounter: Payer: Self-pay | Admitting: Gastroenterology

## 2019-01-22 ENCOUNTER — Encounter: Payer: Self-pay | Admitting: Gastroenterology

## 2019-01-22 ENCOUNTER — Ambulatory Visit (INDEPENDENT_AMBULATORY_CARE_PROVIDER_SITE_OTHER): Payer: Medicare Other | Admitting: Gastroenterology

## 2019-01-22 VITALS — BP 129/79 | HR 81 | Ht 62.0 in | Wt 146.8 lb

## 2019-01-22 DIAGNOSIS — K580 Irritable bowel syndrome with diarrhea: Secondary | ICD-10-CM

## 2019-01-22 MED ORDER — RIFAXIMIN 200 MG PO TABS: 200.0000 mg | ORAL_TABLET | Freq: Three times a day (TID) | ORAL | 1 refills | Status: DC

## 2019-01-22 NOTE — Progress Notes (Signed)
Jonathon Bellows MD, MRCP(U.K) [address]  [place]  [place], [place] [address]  Main: [phone]  Fax: [phone]   Primary Care Physician: Guadalupe Maple, MD  Primary Gastroenterologist:  Dr. Jonathon Bellows   No chief complaint on file.   HPI: Amanda Wang is a 68 y.o. female   Summary of history :  She is here today to follow-up for IBS-D. Her initial visit  was seen on 11/07/2018 referred for diarrhea. 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 She has over 3 bowel movements a day. Watery in nature. She has taken Imodium occasionally. No real change of weight. Consumed 2 cans of soda a day occasional sweet tea no hard candy or chewing gum. Is afraid to pass gas that she thought she would have leakage. She had used a recent antibiotic. An initial round of Xifaxan did work and most of her symptoms had settled . Once she completed it and went off the medications, when has a lot of gas has discomfort. , feels like a lot of bubbles in her abdomen that bubbles,when she has a bowel movement "like a fire works " with "pops  Labs 08/2018 : CBC, CMP,TSH,copper, vitamin E,Zinc , vitamin A,iron studies-normal , 11/10/2018: Fecal calprotectin elevated at 243. Stool studies PCR was negative. Stool testing for C. difficile was negative. Celiac serology negative.  11/28/18 : colonoscopy :Normal TI and random colon biopsies .   Interval history1/16/2020-01/22/2019  12/2018 : Hydrogen breath test was positive   Still having diarrhea and lot of bloating .Not on any sodas, no artificial sugars in her diet .     Current Outpatient Medications  Medication Sig Dispense Refill  . amLODipine (NORVASC) 5 MG tablet Take 1 tablet (5 mg total) by mouth daily. 90 tablet 4  . atorvastatin (LIPITOR) 40 MG tablet Take 1 tablet (40 mg total) by mouth daily at 6 PM. 90 tablet 4  . benazepril (LOTENSIN) 20 MG tablet Take 1 tablet (20 mg total) by mouth daily. 90  tablet 4  . cyclobenzaprine (FLEXERIL) 10 MG tablet take 1 tablet by mouth three times a day if needed for muscle spasm 90 tablet 4  . gabapentin (NEURONTIN) 300 MG capsule Take 1 capsule (300 mg total) by mouth 3 (three) times daily. 180 capsule 4  . hydrochlorothiazide (HYDRODIURIL) 25 MG tablet Take 1 tablet (25 mg total) by mouth daily. 90 tablet 4  . levETIRAcetam (KEPPRA) 500 MG tablet Take 1 tablet (500 mg total) by mouth daily. 90 tablet 4  . LORazepam (ATIVAN) 1 MG tablet Take 0.5-1 tablets (0.5-1 mg total) by mouth daily as needed for anxiety. 30 tablet 0  . montelukast (SINGULAIR) 10 MG tablet Take 1 tablet (10 mg total) by mouth at bedtime. 90 tablet 4  . Vilazodone HCl (VIIBRYD) 20 MG TABS Take 1 tablet (20 mg total) by mouth daily. 90 tablet 4   No current facility-administered medications for this visit.     Allergies as of 01/22/2019 - Review Complete 01/02/2019  Allergen Reaction Noted  . Fexofenadine Anxiety 11/04/2016  . Codeine  07/15/2015  . Methylphenidate hcl  07/24/2018  . Zoloft [sertraline]  07/15/2015  . Magnevist  [gadopentetate] Other (See Comments) 07/27/2015  . Sulfa antibiotics Rash 07/15/2015    ROS:  General: Negative for anorexia, weight loss, fever, chills, fatigue, weakness. ENT: Negative for hoarseness, difficulty swallowing , nasal congestion. CV: Negative for chest pain, angina, palpitations, dyspnea on exertion, peripheral edema.  Respiratory: Negative for dyspnea at rest, dyspnea on exertion, cough, sputum, wheezing.  GI: See history of present illness. GU:  Negative for dysuria, hematuria, urinary incontinence, urinary frequency, nocturnal urination.  Endo: Negative for unusual weight change.    Physical Examination:   BP 129/79   Pulse 81   Ht 5\' 2"  (1.575 m)   Wt 146 lb 12.8 oz (66.6 kg)   BMI 26.85 kg/m   General: Well-nourished, well-developed in no acute distress.  Eyes: No icterus. Conjunctivae pink. Mouth: Oropharyngeal  mucosa moist and pink , no lesions erythema or exudate. Lungs: Clear to auscultation bilaterally. Non-labored. Heart: Regular rate and rhythm, no murmurs rubs or gallops.  Abdomen: Bowel sounds are normal, nontender, nondistended, no hepatosplenomegaly or masses, no abdominal bruits or hernia , no rebound or guarding.   Extremities: No lower extremity edema. No clubbing or deformities. Neuro: Alert and oriented x 3.  Grossly intact. Skin: Warm and dry, no jaundice.   Psych: Alert and cooperative, normal mood and affect.   Imaging Studies: No results found.  Assessment and Plan:   Amanda Wang is a 68 y.o. y/o femalehere to follow up fordiarrhea since may 2019 . Possible IBS-D.Symptoms recurred after stopping antibiotics   Plan  1. Xifaxan for 4 weeks  2. PRN activated charcoal and LOW FODMAP diet  3. PRN imodium   Dr Jonathon Bellows  MD,MRCP [facility]) Follow up in 3 -4 weeks

## 2019-01-24 ENCOUNTER — Other Ambulatory Visit: Payer: Self-pay

## 2019-01-24 MED ORDER — RIFAXIMIN 200 MG PO TABS: 200.0000 mg | ORAL_TABLET | Freq: Two times a day (BID) | ORAL | 1 refills | Status: DC

## 2019-01-27 ENCOUNTER — Telehealth: Payer: Self-pay | Admitting: Gastroenterology

## 2019-01-27 NOTE — Telephone Encounter (Signed)
Patient came in on Friday to have medication filled she spoke with [facility] in [place] and they do not her prescription for Rifaximin. The medication was changed to  200 mg three x's a day but with the patient's insurance this is a $900 deductible for the patient. She needs it changed to 550 mg 2 x's a day & then it will be a $10 dollar co-pay.She does not have any medication!

## 2019-01-28 MED ORDER — RIFAXIMIN 550 MG PO TABS: 550.0000 mg | ORAL_TABLET | Freq: Two times a day (BID) | ORAL | 1 refills | Status: DC

## 2019-01-28 NOTE — Telephone Encounter (Signed)
Pt is aware that her corrected prescription for Xifaxan has been sent to her preferred pharmacy.

## 2019-01-28 NOTE — Addendum Note (Signed)
Addended by: Dorethea Clan on: 01/28/2019 11:29 AM   Modules accepted: Orders

## 2019-02-03 DIAGNOSIS — F411 Generalized anxiety disorder: Secondary | ICD-10-CM | POA: Diagnosis not present

## 2019-02-10 DIAGNOSIS — F411 Generalized anxiety disorder: Secondary | ICD-10-CM | POA: Diagnosis not present

## 2019-02-17 DIAGNOSIS — F411 Generalized anxiety disorder: Secondary | ICD-10-CM | POA: Diagnosis not present

## 2019-02-18 ENCOUNTER — Other Ambulatory Visit: Payer: Self-pay

## 2019-02-18 ENCOUNTER — Ambulatory Visit (INDEPENDENT_AMBULATORY_CARE_PROVIDER_SITE_OTHER): Payer: Medicare Other | Admitting: Gastroenterology

## 2019-02-18 ENCOUNTER — Encounter: Payer: Self-pay | Admitting: Gastroenterology

## 2019-02-18 VITALS — BP 142/85 | HR 81 | Ht 62.0 in | Wt 150.4 lb

## 2019-02-18 DIAGNOSIS — K580 Irritable bowel syndrome with diarrhea: Secondary | ICD-10-CM | POA: Diagnosis not present

## 2019-02-18 NOTE — Progress Notes (Signed)
Jonathon Bellows MD, MRCP(U.K) [address]  [place]  [place], [place] [address]  Main: [phone]  Fax: [phone]   Primary Care Physician: Guadalupe Maple, MD  Primary Gastroenterologist:  Dr. Jonathon Bellows   No chief complaint on file.   HPI: Amanda Wang is a 68 y.o. female   Summary of history :  She is here today to follow-up for IBS-D. Her initial visit  was seen on 11/07/2018 referred for diarrhea. 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 She has over 3 bowel movements a day. Watery in nature. She has taken Imodium occasionally. No real change of weight. Consumed 2 cans of soda a day occasional sweet tea no hard candy or chewing gum. Is afraid to pass gas that she thought she would have leakage.  An initial round of Xifaxan did work and most of her symptoms had settled . Once she completed it and went off the medications,  , feels like a lot of bubbles in her abdomen that bubbles,when she has a bowel movement "like a fire works " with "pops  Labs 08/2018 : CBC, CMP,TSH,copper, vitamin E,Zinc , vitamin A,iron studies-normal , 11/10/2018: Fecal calprotectin elevated at 243. Stool studies PCR was negative. Stool testing for C. difficile was negative. Celiac serology negative.  11/28/18 : colonoscopy :Normal TI and random colon biopsies .   12/2018 : Hydrogen breath test was positive   Interval history1/16/2020-01/22/2019 Has a few more days of xifaxan left in her 4 week course. Doing better still has some symptoms    Current Outpatient Medications  Medication Sig Dispense Refill  . amLODipine (NORVASC) 5 MG tablet Take 1 tablet (5 mg total) by mouth daily. 90 tablet 4  . atorvastatin (LIPITOR) 40 MG tablet Take 1 tablet (40 mg total) by mouth daily at 6 PM. 90 tablet 4  . benazepril (LOTENSIN) 20 MG tablet Take 1 tablet (20 mg total) by mouth daily. 90 tablet 4  . cyclobenzaprine (FLEXERIL) 10 MG tablet take 1 tablet by  mouth three times a day if needed for muscle spasm 90 tablet 4  . gabapentin (NEURONTIN) 300 MG capsule Take 1 capsule (300 mg total) by mouth 3 (three) times daily. 180 capsule 4  . hydrochlorothiazide (HYDRODIURIL) 25 MG tablet Take 1 tablet (25 mg total) by mouth daily. 90 tablet 4  . levETIRAcetam (KEPPRA) 500 MG tablet Take 1 tablet (500 mg total) by mouth daily. 90 tablet 4  . LORazepam (ATIVAN) 1 MG tablet Take 0.5-1 tablets (0.5-1 mg total) by mouth daily as needed for anxiety. 30 tablet 0  . montelukast (SINGULAIR) 10 MG tablet Take 1 tablet (10 mg total) by mouth at bedtime. 90 tablet 4  . rifaximin (XIFAXAN) 550 MG TABS tablet Take 1 tablet (550 mg total) by mouth 2 (two) times daily for 28 days. 28 tablet 1  . Vilazodone HCl (VIIBRYD) 20 MG TABS Take 1 tablet (20 mg total) by mouth daily. 90 tablet 4   No current facility-administered medications for this visit.     Allergies as of 02/18/2019 - Review Complete 01/22/2019  Allergen Reaction Noted  . Fexofenadine Anxiety 11/04/2016  . Codeine  07/15/2015  . Methylphenidate hcl  07/24/2018  . Zoloft [sertraline]  07/15/2015  . Magnevist  [gadopentetate] Other (See Comments) 07/27/2015  . Sulfa antibiotics Rash 07/15/2015    ROS:  General: Negative for anorexia, weight loss, fever, chills, fatigue, weakness. ENT: Negative for hoarseness, difficulty swallowing , nasal congestion.  CV: Negative for chest pain, angina, palpitations, dyspnea on exertion, peripheral edema.  Respiratory: Negative for dyspnea at rest, dyspnea on exertion, cough, sputum, wheezing.  GI: See history of present illness. GU:  Negative for dysuria, hematuria, urinary incontinence, urinary frequency, nocturnal urination.  Endo: Negative for unusual weight change.    Physical Examination:   There were no vitals taken for this visit.  General: Well-nourished, well-developed in no acute distress.  Eyes: No icterus. Conjunctivae pink. Mouth: Oropharyngeal  mucosa moist and pink , no lesions erythema or exudate. Lungs: Clear to auscultation bilaterally. Non-labored. Heart: Regular rate and rhythm, no murmurs rubs or gallops.  Abdomen: Bowel sounds are normal, nontender, nondistended, no hepatosplenomegaly or masses, no abdominal bruits or hernia , no rebound or guarding.   Extremities: No lower extremity edema. No clubbing or deformities. Neuro: Alert and oriented x 3.  Grossly intact. Skin: Warm and dry, no jaundice.   Psych: Alert and cooperative, normal mood and affect.   Imaging Studies: No results found.  Assessment and Plan:   Amanda Wang is a 68 y.o. y/o female here to follow up for IBS-D.Symptoms recurred after stopping antibiotics  Plan  1. Xifaxan to be completed- she will call in a week if symptoms have not resolved and may have to increase duration by 2 more weeks  2. PRN activated charcoal and LOW FODMAP diet  3. PRN imodium   Dr Jonathon Bellows  MD,MRCP [facility]) Follow up in 8-12 weeks

## 2019-02-25 DIAGNOSIS — F411 Generalized anxiety disorder: Secondary | ICD-10-CM | POA: Diagnosis not present

## 2019-02-26 ENCOUNTER — Other Ambulatory Visit: Payer: Self-pay

## 2019-02-26 MED ORDER — RIFAXIMIN 550 MG PO TABS: 550.0000 mg | ORAL_TABLET | Freq: Two times a day (BID) | ORAL | 1 refills | Status: AC

## 2019-03-04 DIAGNOSIS — F411 Generalized anxiety disorder: Secondary | ICD-10-CM | POA: Diagnosis not present

## 2019-03-17 ENCOUNTER — Encounter: Payer: Self-pay | Admitting: Family Medicine

## 2019-03-17 ENCOUNTER — Ambulatory Visit (INDEPENDENT_AMBULATORY_CARE_PROVIDER_SITE_OTHER): Payer: Medicare Other | Admitting: Family Medicine

## 2019-03-17 DIAGNOSIS — E7849 Other hyperlipidemia: Secondary | ICD-10-CM | POA: Diagnosis not present

## 2019-03-17 DIAGNOSIS — F419 Anxiety disorder, unspecified: Secondary | ICD-10-CM | POA: Diagnosis not present

## 2019-03-17 DIAGNOSIS — F3342 Major depressive disorder, recurrent, in full remission: Secondary | ICD-10-CM | POA: Diagnosis not present

## 2019-03-17 DIAGNOSIS — I10 Essential (primary) hypertension: Secondary | ICD-10-CM

## 2019-03-17 MED ORDER — LORAZEPAM 1 MG PO TABS: 0.5000 mg | ORAL_TABLET | Freq: Every day | ORAL | 0 refills | Status: DC | PRN

## 2019-03-17 NOTE — Assessment & Plan Note (Signed)
Discussed anxiety and rare use of lorazepam gave refills

## 2019-03-17 NOTE — Assessment & Plan Note (Signed)
The current medical regimen is effective;  continue present plan and medications.  

## 2019-03-17 NOTE — Progress Notes (Signed)
BP 136/71   Temp (!) 96.2 F (35.7 C) (Oral)   Ht 5\' 2"  (1.575 m)   Wt 143 lb (64.9 kg)   BMI 26.16 kg/m    Subjective:    Patient social distancing: Amanda Wang, female    DOB: October 14, 1951, 68 y.o.   MRN: [id number]  HPI: Amanda Wang is a 68 y.o. female  Chief Complaint  Patient presents with  . Follow-up  . Hypertension  . Hyperlipidemia  . Back Pain    Shooting pain across back, down arm lasted 3-5 minutes.   Telemedicine using audio and telecommunications for a synchronous communication visit. Today's visit due to COVID-19 isolation precautions I connected with Amanda Wang and verified that I am speaking with the correct person using two identifiers.   I discussed the limitations, risks, security and privacy concerns of performing an evaluation and management service by telephone and the availability of in person appointments. I also discussed with the patient that there may be a patient responsible charge related to this service. The patient expressed understanding and agreed to proceed. The patient's location is home. I am at home. Patient all in all doing well paying attention to coronavirus social distancing. Routine medical problems stable blood pressure no issues good control restless leg stable cholesterol doing well no complaints depression and nerves doing stable anxiety rare use of lorazepam is been a year and needs a refill. Working with GI for irritable bowel issues.  Relevant past medical, surgical, family and social history reviewed and updated as indicated. Interim medical history since our last visit reviewed. Allergies and medications reviewed and updated.  Review of Systems  Constitutional: Negative.   Respiratory: Negative.   Cardiovascular: Negative.     Per HPI unless specifically indicated above     Objective:    BP 136/71   Temp (!) 96.2 F (35.7 C) (Oral)   Ht 5\' 2"  (1.575 m)   Wt 143 lb (64.9 kg)   BMI 26.16 kg/m   Wt  Readings from Last 3 Encounters:  03/17/19 143 lb (64.9 kg)  02/18/19 150 lb 6.4 oz (68.2 kg)  01/22/19 146 lb 12.8 oz (66.6 kg)    Physical Exam none     Assessment & Plan:   Problem List Items Addressed This Visit      Cardiovascular and Mediastinum   Hypertension    The current medical regimen is effective;  continue present plan and medications.         Other   Other hyperlipidemia    The current medical regimen is effective;  continue present plan and medications.       Depression    The current medical regimen is effective;  continue present plan and medications.       Relevant Medications   LORazepam (ATIVAN) 1 MG tablet   Anxiety    Discussed anxiety and rare use of lorazepam gave refills      Relevant Medications   LORazepam (ATIVAN) 1 MG tablet       I discussed the assessment and treatment plan with the patient. The patient was provided an opportunity to ask questions and all were answered. The patient agreed with the plan and demonstrated an understanding of the instructions.   The patient was advised to call back or seek an in-person evaluation if the symptoms worsen or if the condition fails to improve as anticipated.   I provided 21+ minutes of time during this encounter. Follow up plan: Return in  about 6 months (around 09/17/2019) for Physical Exam.

## 2019-03-19 DIAGNOSIS — K6389 Other specified diseases of intestine: Secondary | ICD-10-CM | POA: Diagnosis not present

## 2019-03-19 DIAGNOSIS — R197 Diarrhea, unspecified: Secondary | ICD-10-CM | POA: Diagnosis not present

## 2019-03-19 DIAGNOSIS — Z9884 Bariatric surgery status: Secondary | ICD-10-CM | POA: Diagnosis not present

## 2019-03-26 DIAGNOSIS — F411 Generalized anxiety disorder: Secondary | ICD-10-CM | POA: Diagnosis not present

## 2019-04-02 DIAGNOSIS — F411 Generalized anxiety disorder: Secondary | ICD-10-CM | POA: Diagnosis not present

## 2019-04-09 DIAGNOSIS — F411 Generalized anxiety disorder: Secondary | ICD-10-CM | POA: Diagnosis not present

## 2019-04-14 ENCOUNTER — Ambulatory Visit: Payer: Medicare Other | Admitting: Gastroenterology

## 2019-04-14 ENCOUNTER — Ambulatory Visit (INDEPENDENT_AMBULATORY_CARE_PROVIDER_SITE_OTHER): Payer: Medicare Other | Admitting: Gastroenterology

## 2019-04-14 DIAGNOSIS — K580 Irritable bowel syndrome with diarrhea: Secondary | ICD-10-CM

## 2019-04-14 DIAGNOSIS — F411 Generalized anxiety disorder: Secondary | ICD-10-CM | POA: Diagnosis not present

## 2019-04-14 NOTE — Progress Notes (Signed)
Amanda Wang , MD [address]  [place], [place] [address]  Main: [phone]  Fax: [phone]   Primary Care Physician: Guadalupe Maple, MD  Virtual Visit via Video Note  I connected with patient on 04/14/19 at 10:30 AM EDT by video and verified that I am speaking with the correct person using two identifiers.   I discussed the limitations, risks, security and privacy concerns of performing an evaluation and management service by video  and the availability of in person appointments. I also discussed with the patient that there may be a patient responsible charge related to this service. The patient expressed understanding and agreed to proceed.  Location of Patient: Home Location of Provider: Home Persons involved: Patient and provider only   History of Present Illness: Chief Complaint  Patient presents with  . Follow-up    Irritable bowel syndrome    HPI: Amanda Wang is a 68 y.o. female   Summary of history :  She is here today to follow-upfor IBS-D. Herinitial visit was seen on 11/07/2018 referred for diarrhea. 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 She has over 3 bowel movements a day. Watery in nature. She has taken Imodium occasionally. No real change of weight. Consumed2 cans of soda a day occasional sweet tea no hard candy or chewing gum. Is afraid to pass gas that she thought she would have leakage.  An initial round of Xifaxandid work and most of her symptoms had settled . Once she completed it and went off the medications,  , feels like a lot of bubbles in her abdomen that bubbles,when she has a bowel movement "like a fire works " with "pops  Labs 08/2018 : CBC, CMP,TSH,copper, vitamin E,Zinc , vitamin A,iron studies-normal , 11/10/2018: Fecal calprotectin elevated at 243. Stool studies PCR was negative. Stool testing for C. difficile was negative. Celiac serology negative.  11/28/18 :  colonoscopy :Normal TI and random colon biopsies .   12/2018 : Hydrogen breath test was positivetreated with xifaxan : 4 week course  Interval history2/04/2019-04/14/2019   She has been getting better, less gas, been off xifaxan for 2 weeks, stools not runny any more- still soft . She is taking a cellulose supplement.   Has more good days than bad days.  Current Outpatient Medications  Medication Sig Dispense Refill  . amLODipine (NORVASC) 5 MG tablet Take 1 tablet (5 mg total) by mouth daily. 90 tablet 4  . atorvastatin (LIPITOR) 40 MG tablet Take 1 tablet (40 mg total) by mouth daily at 6 PM. 90 tablet 4  . benazepril (LOTENSIN) 20 MG tablet Take 1 tablet (20 mg total) by mouth daily. 90 tablet 4  . cyclobenzaprine (FLEXERIL) 10 MG tablet take 1 tablet by mouth three times a day if needed for muscle spasm 90 tablet 4  . gabapentin (NEURONTIN) 300 MG capsule Take 1 capsule (300 mg total) by mouth 3 (three) times daily. 180 capsule 4  . hydrochlorothiazide (HYDRODIURIL) 25 MG tablet Take 1 tablet (25 mg total) by mouth daily. 90 tablet 4  . levETIRAcetam (KEPPRA) 500 MG tablet Take 1 tablet (500 mg total) by mouth daily. 90 tablet 4  . LORazepam (ATIVAN) 1 MG tablet Take 0.5-1 tablets (0.5-1 mg total) by mouth daily as needed for anxiety. 30 tablet 0  . Vilazodone HCl (VIIBRYD) 20 MG TABS Take 1 tablet (20 mg total) by mouth daily. 90 tablet 4  . montelukast (SINGULAIR) 10  MG tablet Take 1 tablet (10 mg total) by mouth at bedtime. 90 tablet 4   No current facility-administered medications for this visit.     Allergies as of 04/14/2019 - Review Complete 04/14/2019  Allergen Reaction Noted  . Fexofenadine Anxiety 11/04/2016  . Codeine  07/15/2015  . Methylphenidate hcl  07/24/2018  . Zoloft [sertraline]  07/15/2015  . Magnevist  [gadopentetate] Other (See Comments) 07/27/2015  . Sulfa antibiotics Rash 07/15/2015    Review of Systems:    All systems reviewed and negative except  where noted in HPI.  General Appearance:    Alert, cooperative, no distress, appears stated age  Head:    Normocephalic, without obvious abnormality, atraumatic  Eyes:    PERRL, conjunctiva/corneas clear,  Ears:    Grossly normal hearing    Neurologic:  Grossly normal    Observations/Objective:  Labs: CMP     Component Value Date/Time   NA 145 (H) 09/16/2018 1026   NA 142 07/24/2012 0928   K 3.6 09/16/2018 1026   K 4.2 07/24/2012 0928   CL 102 09/16/2018 1026   CL 104 07/24/2012 0928   CO2 27 09/16/2018 1026   CO2 33 (H) 07/24/2012 0928   GLUCOSE 87 09/16/2018 1026   GLUCOSE 117 (H) 07/24/2012 0928   BUN 12 09/16/2018 1026   BUN 17 07/24/2012 0928   CREATININE 0.64 09/16/2018 1026   CREATININE 0.88 07/24/2012 0928   CALCIUM 9.3 09/16/2018 1026   CALCIUM 8.5 07/24/2012 0928   PROT 7.2 09/16/2018 1026   PROT 7.7 07/24/2012 0928   ALBUMIN 4.3 09/16/2018 1026   ALBUMIN 3.7 07/24/2012 0928   AST 10 09/16/2018 1026   AST 26 03/07/2018 1419   AST 26 07/24/2012 0928   ALT 10 09/16/2018 1026   ALT 23 03/07/2018 1419   ALT 26 07/24/2012 0928   ALKPHOS 104 09/16/2018 1026   ALKPHOS 102 07/24/2012 0928   BILITOT 0.4 09/16/2018 1026   BILITOT 0.4 07/24/2012 0928   GFRNONAA 94 09/16/2018 1026   GFRNONAA >60 07/24/2012 0928   GFRAA 108 09/16/2018 1026   GFRAA >60 07/24/2012 0928   Lab Results  Component Value Date   WBC 6.3 09/16/2018   HGB 11.8 09/16/2018   HCT 36.5 09/16/2018   MCV 85 09/16/2018   PLT 321 09/16/2018    Imaging Studies: No results found.  Assessment and Plan:   Amanda Wang is a 68 y.o. y/o female  here to follow up for IBS-D.Symptoms recurred after stopping antibiotics- treated with a 6 week course and now feels better   Plan  1PRNactivated [place] LOW FODMAP diet  F/u PRN I discussed the assessment and treatment plan with the patient. The patient was provided an opportunity to ask questions and all were answered. The patient  agreed with the plan and demonstrated an understanding of the instructions.   The patient was advised to call back or seek an in-person evaluation if the symptoms worsen or if the condition fails to improve as anticipated.   Dr Amanda Bellows MD,MRCP [facility]) Gastroenterology/Hepatology Pager: [phone]   Speech recognition software was used to dictate this note.

## 2019-04-22 DIAGNOSIS — F411 Generalized anxiety disorder: Secondary | ICD-10-CM | POA: Diagnosis not present

## 2019-04-29 DIAGNOSIS — F411 Generalized anxiety disorder: Secondary | ICD-10-CM | POA: Diagnosis not present

## 2019-05-06 ENCOUNTER — Ambulatory Visit: Payer: Medicare Other | Admitting: Gastroenterology

## 2019-05-06 DIAGNOSIS — F411 Generalized anxiety disorder: Secondary | ICD-10-CM | POA: Diagnosis not present

## 2019-05-13 DIAGNOSIS — F411 Generalized anxiety disorder: Secondary | ICD-10-CM | POA: Diagnosis not present

## 2019-05-20 DIAGNOSIS — F411 Generalized anxiety disorder: Secondary | ICD-10-CM | POA: Diagnosis not present

## 2019-05-27 DIAGNOSIS — F411 Generalized anxiety disorder: Secondary | ICD-10-CM | POA: Diagnosis not present

## 2019-06-03 DIAGNOSIS — F411 Generalized anxiety disorder: Secondary | ICD-10-CM | POA: Diagnosis not present

## 2019-06-10 DIAGNOSIS — F411 Generalized anxiety disorder: Secondary | ICD-10-CM | POA: Diagnosis not present

## 2019-06-24 DIAGNOSIS — F411 Generalized anxiety disorder: Secondary | ICD-10-CM | POA: Diagnosis not present

## 2019-06-25 ENCOUNTER — Ambulatory Visit: Payer: Self-pay | Admitting: *Deleted

## 2019-06-25 NOTE — Telephone Encounter (Signed)
Summary: poison ivy   Patient has poison ivy on hands and legs      Attempted to call patient regarding her symptoms- left message to call back.

## 2019-06-25 NOTE — Telephone Encounter (Signed)
Pt. Was working in her yard  Saturday and developed poison ivy rash. Hands and legs. Extremely itchy, taking Benadryl. No relief. Requests a visit. Warm transfer to [facility] in the practice. Answer Assessment - Initial Assessment Questions 1. APPEARANCE of RASH: "Describe the rash."      Raised weepy rash 2. LOCATION: "Where is the rash located?"      Hands and legs 3. SIZE: "How large is the rash?"      Large 4. ONSET: "When did the rash begin?"      Started Saturday 5. ITCHING: "Does the rash itch?" If so, ask: "How bad is it?"   - MILD - doesn't interfere with normal activities   - MODERATE - SEVERE: interferes with work, school, sleep, or other activities      Severe 6. PREGNANCY: "Is there any chance you are pregnant?" "When was your last menstrual period?"     No  Protocols used: POISON IVY - OAK - SUMAC-A-AH

## 2019-06-26 ENCOUNTER — Ambulatory Visit (INDEPENDENT_AMBULATORY_CARE_PROVIDER_SITE_OTHER): Payer: Medicare Other | Admitting: Nurse Practitioner

## 2019-06-26 ENCOUNTER — Other Ambulatory Visit: Payer: Self-pay

## 2019-06-26 ENCOUNTER — Encounter: Payer: Self-pay | Admitting: Nurse Practitioner

## 2019-06-26 DIAGNOSIS — L237 Allergic contact dermatitis due to plants, except food: Secondary | ICD-10-CM

## 2019-06-26 MED ORDER — CLOBETASOL PROP EMOLLIENT BASE 0.05 % EX CREA: 1.0000 "application " | TOPICAL_CREAM | Freq: Two times a day (BID) | CUTANEOUS | 1 refills | Status: DC

## 2019-06-26 MED ORDER — PREDNISONE 10 MG PO TABS: ORAL_TABLET | ORAL | 0 refills | Status: DC

## 2019-06-26 NOTE — Progress Notes (Signed)
Ht 5\' 2"  (1.575 m)   Wt 152 lb (68.9 kg)   BMI 27.80 kg/m    Subjective:    Patient ID: Amanda Wang, female    DOB: [birth year], 68 y.o.   MRN: [id number]  HPI: Amanda Wang is a 68 y.o. female  Chief Complaint  Patient presents with  . Rash    Patient states she has poison ivy. Patient states she's very allergic to it and has not slept the past 2 nights. Patient tried many OTC medications with no relief.     . This visit was completed via WebEx due to the restrictions of the COVID-19 pandemic. All issues as above were discussed and addressed. Physical exam was done as above through visual confirmation on WebEx. If it was felt that the patient should be evaluated in the office, they were directed there. The patient verbally consented to this visit. . Location of the patient: home . Location of the provider: home . Those involved with this call:  . Provider: Marnee Guarneri, DNP . CMA: Merilyn Baba, CMA . Front Desk/Registration: Jill Side  . Time spent on call: 15 minutes with patient face to face via video conference. More than 50% of this time was spent in counseling and coordination of care. 10 minutes total spent in review of patient's record and preparation of their chart. I verified patient identity using two factors (patient name and date of birth). Patient consents verbally to being seen via telemedicine visit today.   RASH Believes she got it off dogs fur.  First showed up on Saturday.  Currently has it around ankles, all the way up to buttocks + back of knees and hands.  Has history of poison ivy and states this is what it looks like.  She used to get shots to prevent poison ivy.  Has been treated in past for poison ivy with oral steroids and this was helpful. Duration:  days  Location: buttocks, hands and legs  Itching: yes Burning: yes Redness: yes Oozing: no Scaling: no Blisters: no Painful: no Fevers: no Change in detergents/soaps/personal care  products: no Recent illness: no Recent travel:no History of same: yes Context: worse Alleviating factors: calamine Treatments attempted:calamine Shortness of breath: no  Throat/tongue swelling: no Myalgias/arthralgias: no  Relevant past medical, surgical, family and social history reviewed and updated as indicated. Interim medical history since our last visit reviewed. Allergies and medications reviewed and updated.  Review of Systems  Constitutional: Negative for activity change, appetite change, diaphoresis, fatigue and fever.  Eyes: Negative for redness and visual disturbance.  Respiratory: Negative for cough, chest tightness and shortness of breath.   Cardiovascular: Negative for chest pain, palpitations and leg swelling.  Gastrointestinal: Negative for abdominal distention, abdominal pain, constipation, diarrhea, nausea and vomiting.  Skin: Positive for rash.  Psychiatric/Behavioral: Negative.     Per HPI unless specifically indicated above     Objective:    Ht 5\' 2"  (1.575 m)   Wt 152 lb (68.9 kg)   BMI 27.80 kg/m   Wt Readings from Last 3 Encounters:  06/26/19 152 lb (68.9 kg)  03/17/19 143 lb (64.9 kg)  02/18/19 150 lb 6.4 oz (68.2 kg)    Physical Exam Vitals signs and nursing note reviewed.  Constitutional:      General: She is awake. She is not in acute distress.    Appearance: She is well-developed. She is not ill-appearing.  HENT:     Head: Normocephalic.     Right  Ear: Hearing normal.     Left Ear: Hearing normal.  Eyes:     General: Lids are normal.        Right eye: No discharge.        Left eye: No discharge.     Conjunctiva/sclera: Conjunctivae normal.  Neck:     Musculoskeletal: Normal range of motion.  Cardiovascular:     Comments: Unable to auscultate due to virtual exam only  Pulmonary:     Effort: Pulmonary effort is normal. No accessory muscle usage or respiratory distress.     Comments: Unable to auscultate due to virtual exam only  Skin:    Comments: Linear pattern rash with mild erythema to bilateral ankles with scaling and viewed virtually behind bilateral knees.  No blisters or vesicles noted.  No drainage.  Patient reports no warmth or edema.  Neurological:     Mental Status: She is alert and oriented to person, place, and time.  Psychiatric:        Attention and Perception: Attention normal.        Mood and Affect: Mood normal.        Behavior: Behavior normal. Behavior is cooperative.        Thought Content: Thought content normal.        Judgment: Judgment normal.     Results for orders placed or performed during the hospital encounter of 11/28/18  Surgical pathology  Result Value Ref Range   SURGICAL PATHOLOGY      Surgical Pathology CASE: [phone] PATIENT: Amanda Wang Surgical Pathology Report     SPECIMEN SUBMITTED: A. Terminal ileum, random, r/o Crohn's; cbx B. Colon, random, r/o microscopic colitis; cbx C. Colon polyp, transverse; cbx  CLINICAL HISTORY: None provided  PRE-OPERATIVE DIAGNOSIS: Irritable bowel syndrome with diarrhea K58.0  POST-OPERATIVE DIAGNOSIS: Colon polyp     DIAGNOSIS: A. TERMINAL ILEUM, RANDOM; COLD BIOPSY: - MINUTE SUPERFICIAL FRAGMENTS OF UNREMARKABLE ENTERIC MUCOSA DISPLAYING NORMAL VILLOUS ARCHITECTURE. - SINGLE SMALL FRAGMENT OF LAMINA PROPRIA WITH MILD CHRONIC INFLAMMATION AND LYMPHOID AGGREGATE. - NO EVIDENCE OF ENTERITIS/ILEITIS, ARCHITECTURAL CHANGES, DYSPLASIA, OR MALIGNANCY.  B. COLON, RANDOM; COLD BIOPSY: - BENIGN COLONIC MUCOSA WITH NO SIGNIFICANT HISTOPATHOLOGIC CHANGE. - NO EVIDENCE OF ACTIVE COLITIS OR DEFINITIVE FEATURES OF MICROSCOPIC COLITIS. - NEGATIVE FOR DYSPLASIA AND MALIGNANCY.  C. COLON POLYP, TRANS VERSE; COLD BIOPSY: - BENIGN INFLAMMATORY POLYP. - NEGATIVE FOR DYSPLASIA AND MALIGNANCY.   GROSS DESCRIPTION: A. Labeled: C BX random terminal ileum rule out Crohn's Received: Formalin Tissue fragment(s): Multiple  Size: Aggregate, 0.5 x 0.2 x 0.1 cm Description: Tan soft tissue fragments Entirely submitted in 1 cassette.  B. Labeled: C BX random colon rule out microscopic colitis Received: Formalin Tissue fragment(s): Multiple Size: Aggregate, 1.7 x 0.4 x 0.1 cm Description: Tan soft tissue fragments Entirely submitted in 1 cassette.  C. Labeled: C BX transverse colon polyp Received: Formalin Tissue fragment(s): 1 Size: 0.4 cm Description: Tan soft tissue fragment Entirely submitted in 1 cassette.   Final Diagnosis performed by Allena Napoleon, MD.   Electronically signed 11/29/2018 12:29:22PM The electronic signature indicates that the named Attending Pathologist has evaluated the specimen  Technical component performed at [facility], [address] [place], [place] [address] Lab: [phone] Dir: Rush Farmer, MD, MMM  Professional component performed at [facility], [facility], [place], [place], [place] [address] Lab: [phone] Dir: Dellia Nims. Reuel Derby, MD       Assessment & Plan:   Problem List Items Addressed This Visit  Musculoskeletal and Integument   Poison ivy dermatitis    Acute to multiple areas.  History of same successfully treated with Prednisone taper.  Script for Prednisone taper and Clobetasol cream sent.  Recommended washing hands well after application of cream to areas and washing all linens well to prevent further spread.  May take Claritin or Allegra for pruritus.  Return to office for worsening or continued issues.         I discussed the assessment and treatment plan with the patient. The patient was provided an opportunity to ask questions and all were answered. The patient agreed with the plan and demonstrated an understanding of the instructions.   The patient was advised to call back or seek an in-person evaluation if the symptoms worsen or if the condition fails to improve as anticipated.   I provided 15 minutes of time during  this encounter.  Follow up plan: Return if symptoms worsen or fail to improve.

## 2019-06-26 NOTE — Assessment & Plan Note (Signed)
Acute to multiple areas.  History of same successfully treated with Prednisone taper.  Script for Prednisone taper and Clobetasol cream sent.  Recommended washing hands well after application of cream to areas and washing all linens well to prevent further spread.  May take Claritin or Allegra for pruritus.  Return to office for worsening or continued issues.

## 2019-06-26 NOTE — Patient Instructions (Signed)
Poison Ivy Dermatitis Poison ivy dermatitis is redness and soreness of the skin caused by chemicals in the leaves of the poison ivy plant. You may have very bad itching, swelling, a rash, and blisters. What are the causes?  Touching a poison ivy plant.  Touching something that has the chemical on it. This may include animals or objects that have come in contact with the plant. What increases the risk?  Going outdoors often in wooded or marshy areas.  Going outdoors without wearing protective clothing, such as closed shoes, long pants, and a long-sleeved shirt. What are the signs or symptoms?   Skin redness.  Very bad itching.  A rash that often includes bumps and blisters. ? The rash usually appears 48 hours after exposure, if you have been exposed before. ? If this is the first time you have been exposed, the rash may not appear until a week after exposure.  Swelling. This may occur if the reaction is very bad. Symptoms usually last for 1-2 weeks. The first time you develop this condition, symptoms may last 3-4 weeks. How is this treated? This condition may be treated with:  Hydrocortisone cream or calamine lotion to relieve itching.  Oatmeal baths to soothe the skin.  Medicines, such as over-the-counter antihistamine tablets.  Oral steroid medicine for more severe reactions. Follow these instructions at home: Medicines  Take or apply over-the-counter and prescription medicines only as told by your doctor.  Use hydrocortisone cream or calamine lotion as needed to help with itching. General instructions  Do not scratch or rub your skin.  Put a cold, wet cloth (cold compress) on the affected areas or take baths in cool water. This will help with itching.  Avoid hot baths and showers.  Take oatmeal baths as needed. Use colloidal oatmeal. You can get this at a pharmacy or grocery store. Follow the instructions on the package.  While you have the rash, wash your clothes  right after you wear them.  Keep all follow-up visits as told by your health care provider. This is important. How is this prevented?   Know what poison ivy looks like, so you can avoid it. ? This plant has three leaves with flowering branches on a single stem. ? The leaves are glossy. ? The leaves have uneven edges that come to a point at the front.  If you touch poison ivy, wash your skin with soap and water right away. Be sure to wash under your fingernails.  When hiking or camping, wear long pants, a long-sleeved shirt, tall socks, and hiking boots. You can also use a lotion on your skin that helps to prevent contact with poison ivy.  If you think that your clothes or outdoor gear came in contact with poison ivy, rinse them off with a garden hose before you bring them inside your house.  When doing yard work or gardening, wear gloves, long sleeves, long pants, and boots. Wash your garden tools and gloves if they come in contact with poison ivy.  If you think that your pet has come into contact with poison ivy, wash him or her with pet shampoo and water. Make sure to wear gloves while washing your pet. Contact a doctor if:  You have open sores in the rash area.  You have more redness, swelling, or pain in the rash area.  You have redness that spreads beyond the rash area.  You have fluid, blood, or pus coming from the rash area.  You have a   fever.  You have a rash over a large area of your body.  You have a rash on your eyes, mouth, or genitals.  Your rash does not get better after a few weeks. Get help right away if:  Your face swells or your eyes swell shut.  You have trouble breathing.  You have trouble swallowing. These symptoms may be an emergency. Do not wait to see if the symptoms will go away. Get medical help right away. Call your local emergency services (911 in the [place] Do not drive yourself to the hospital. Summary  Poison ivy dermatitis is redness and  soreness of the skin caused by chemicals in the leaves of the poison ivy plant.  You may have skin redness, very bad itching, swelling, and a rash.  Do not scratch or rub your skin.  Take or apply over-the-counter and prescription medicines only as told by your doctor. This information is not intended to replace advice given to you by your health care provider. Make sure you discuss any questions you have with your health care provider. Document Released: 01/06/2011 Document Revised: 03/28/2019 Document Reviewed: 11/29/2018 Elsevier Patient Education  2020 Elsevier Inc.  

## 2019-07-01 DIAGNOSIS — F411 Generalized anxiety disorder: Secondary | ICD-10-CM | POA: Diagnosis not present

## 2019-07-15 DIAGNOSIS — F411 Generalized anxiety disorder: Secondary | ICD-10-CM | POA: Diagnosis not present

## 2019-07-22 DIAGNOSIS — F411 Generalized anxiety disorder: Secondary | ICD-10-CM | POA: Diagnosis not present

## 2019-07-29 DIAGNOSIS — F411 Generalized anxiety disorder: Secondary | ICD-10-CM | POA: Diagnosis not present

## 2019-08-05 DIAGNOSIS — F411 Generalized anxiety disorder: Secondary | ICD-10-CM | POA: Diagnosis not present

## 2019-08-06 ENCOUNTER — Encounter: Payer: Self-pay | Admitting: Family Medicine

## 2019-08-06 ENCOUNTER — Ambulatory Visit (INDEPENDENT_AMBULATORY_CARE_PROVIDER_SITE_OTHER): Payer: Medicare Other | Admitting: Family Medicine

## 2019-08-06 ENCOUNTER — Other Ambulatory Visit: Payer: Self-pay

## 2019-08-06 DIAGNOSIS — R5383 Other fatigue: Secondary | ICD-10-CM

## 2019-08-06 DIAGNOSIS — F339 Major depressive disorder, recurrent, unspecified: Secondary | ICD-10-CM

## 2019-08-06 NOTE — Assessment & Plan Note (Signed)
The current medical regimen is effective;  continue present plan and medications.  

## 2019-08-06 NOTE — Progress Notes (Addendum)
   There were no vitals taken for this visit.   Subjective:    Patient ID: Amanda Wang, female    DOB: 1951-02-25, 68 y.o.   MRN: [id number]  HPI: SYDNIE Wang is a 68 y.o. 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 Patient also with some irritable bowel type symptoms and is worked with Dr. Vicente Males for these problems. Patient not using CPAP has not used for years. No bleeding bruising issues that she is aware of.  Relevant past medical, surgical, family and social history reviewed and updated as indicated. Interim medical history since our last visit reviewed. Allergies and medications reviewed and updated.  Review of Systems  Constitutional: Positive for fatigue.  Respiratory: Negative.   Cardiovascular: Negative.     Per HPI unless specifically indicated above     Objective:    There were no vitals taken for this visit.  Wt Readings from Last 3 Encounters:  06/26/19 152 lb (68.9 kg)  03/17/19 143 lb (64.9 kg)  02/18/19 150 lb 6.4 oz (68.2 kg)    Physical Exam      Assessment & Plan:   Problem List Items Addressed This Visit      Other   Depression, recurrent ([facility])    The current medical regimen is effective;  continue present plan and medications.       Fatigue    Discussed fatigue possible causes patient will check with GI for her bowel disease to see if that is related. Due to complications and multiple possible causes will check labs also discussed sleep apnea and encourage use of CPAP machine when she has not done in years.      Relevant Orders   Comprehensive metabolic panel   CBC with Differential/Platelet   TSH   Urinalysis, Routine w reflex microscopic      Telemedicine using audio/video telecommunications for a synchronous communication visit. Today's visit due to COVID-19 isolation precautions I connected with and verified that I  am speaking with the correct person using two identifiers.   I discussed the limitations, risks, security and privacy concerns of performing an evaluation and management service by telecommunication and the availability of in person appointments. I also discussed with the patient that there may be a patient responsible charge related to this service. The patient expressed understanding and agreed to proceed. The patient's location is home. I am at home.   I discussed the assessment and treatment plan with the patient. The patient was provided an opportunity to ask questions and all were answered. The patient agreed with the plan and demonstrated an understanding of the instructions.   The patient was advised to call back or seek an in-person evaluation if the symptoms worsen or if the condition fails to improve as anticipated.   I provided 21+ minutes of time during this encounter. Follow up plan: Return for Physical Exam.

## 2019-08-06 NOTE — Assessment & Plan Note (Signed)
Discussed fatigue possible causes patient will check with GI for her bowel disease to see if that is related. Due to complications and multiple possible causes will check labs also discussed sleep apnea and encourage use of CPAP machine when she has not done in years.

## 2019-08-12 DIAGNOSIS — F411 Generalized anxiety disorder: Secondary | ICD-10-CM | POA: Diagnosis not present

## 2019-08-14 ENCOUNTER — Other Ambulatory Visit: Payer: Self-pay | Admitting: Family Medicine

## 2019-08-14 DIAGNOSIS — Z1231 Encounter for screening mammogram for malignant neoplasm of breast: Secondary | ICD-10-CM

## 2019-08-18 ENCOUNTER — Telehealth: Payer: Self-pay | Admitting: Gastroenterology

## 2019-08-18 NOTE — Telephone Encounter (Signed)
Patient was ask to call when she had problems with sibo it has flare up again. Please call medication into [facility] in [place]. The Dx must be IBS for ins to cover. Per patient he does this each time. Please call patient & advise.

## 2019-08-19 DIAGNOSIS — F411 Generalized anxiety disorder: Secondary | ICD-10-CM | POA: Diagnosis not present

## 2019-08-19 NOTE — Telephone Encounter (Signed)
Xifaxan course for IBS D

## 2019-08-19 NOTE — Telephone Encounter (Signed)
Please prescribe Xifaxan for 2 weeks for irritable bowel syndrome with diarrhea

## 2019-08-20 ENCOUNTER — Other Ambulatory Visit: Payer: Self-pay

## 2019-08-20 DIAGNOSIS — K580 Irritable bowel syndrome with diarrhea: Secondary | ICD-10-CM

## 2019-08-20 MED ORDER — RIFAXIMIN 550 MG PO TABS: 550.0000 mg | ORAL_TABLET | Freq: Two times a day (BID) | ORAL | 0 refills | Status: AC

## 2019-08-20 NOTE — Telephone Encounter (Signed)
Pt left m she states she left a message on Monday and has not heard anything regarding her symptoms please call pt

## 2019-08-20 NOTE — Telephone Encounter (Signed)
Spoke with pt and informed her that we have sent the prescription for Xifaxan to her preferred pharmacy.

## 2019-08-22 ENCOUNTER — Ambulatory Visit
Admission: RE | Admit: 2019-08-22 | Discharge: 2019-08-22 | Disposition: A | Discharge status: Home / Self Care | Payer: Medicare Other | Source: Ambulatory Visit | Attending: Family Medicine | Admitting: Family Medicine

## 2019-08-22 DIAGNOSIS — Z1231 Encounter for screening mammogram for malignant neoplasm of breast: Secondary | ICD-10-CM | POA: Diagnosis not present

## 2019-09-09 DIAGNOSIS — F411 Generalized anxiety disorder: Secondary | ICD-10-CM | POA: Diagnosis not present

## 2019-09-15 ENCOUNTER — Encounter: Payer: Self-pay | Admitting: Family Medicine

## 2019-09-15 ENCOUNTER — Other Ambulatory Visit: Payer: Medicare Other

## 2019-09-15 ENCOUNTER — Other Ambulatory Visit: Payer: Self-pay

## 2019-09-15 ENCOUNTER — Ambulatory Visit (INDEPENDENT_AMBULATORY_CARE_PROVIDER_SITE_OTHER): Payer: Medicare Other | Admitting: Family Medicine

## 2019-09-15 DIAGNOSIS — F3342 Major depressive disorder, recurrent, in full remission: Secondary | ICD-10-CM

## 2019-09-15 DIAGNOSIS — K580 Irritable bowel syndrome with diarrhea: Secondary | ICD-10-CM | POA: Diagnosis not present

## 2019-09-15 DIAGNOSIS — F339 Major depressive disorder, recurrent, unspecified: Secondary | ICD-10-CM | POA: Diagnosis not present

## 2019-09-15 DIAGNOSIS — G935 Compression of brain: Secondary | ICD-10-CM

## 2019-09-15 DIAGNOSIS — G2581 Restless legs syndrome: Secondary | ICD-10-CM

## 2019-09-15 DIAGNOSIS — E7849 Other hyperlipidemia: Secondary | ICD-10-CM | POA: Diagnosis not present

## 2019-09-15 DIAGNOSIS — E785 Hyperlipidemia, unspecified: Secondary | ICD-10-CM

## 2019-09-15 DIAGNOSIS — H1013 Acute atopic conjunctivitis, bilateral: Secondary | ICD-10-CM

## 2019-09-15 DIAGNOSIS — Z7189 Other specified counseling: Secondary | ICD-10-CM

## 2019-09-15 DIAGNOSIS — J3089 Other allergic rhinitis: Secondary | ICD-10-CM | POA: Diagnosis not present

## 2019-09-15 DIAGNOSIS — I10 Essential (primary) hypertension: Secondary | ICD-10-CM

## 2019-09-15 MED ORDER — AMLODIPINE BESYLATE 5 MG PO TABS: 5.0000 mg | ORAL_TABLET | Freq: Every day | ORAL | 4 refills | Status: DC

## 2019-09-15 MED ORDER — BENAZEPRIL HCL 20 MG PO TABS: 20.0000 mg | ORAL_TABLET | Freq: Every day | ORAL | 4 refills | Status: DC

## 2019-09-15 MED ORDER — VIIBRYD 20 MG PO TABS: 20.0000 mg | ORAL_TABLET | Freq: Every day | ORAL | 4 refills | Status: DC

## 2019-09-15 MED ORDER — HYDROCHLOROTHIAZIDE 25 MG PO TABS: 25.0000 mg | ORAL_TABLET | Freq: Every day | ORAL | 4 refills | Status: DC

## 2019-09-15 MED ORDER — MONTELUKAST SODIUM 10 MG PO TABS: 10.0000 mg | ORAL_TABLET | Freq: Every day | ORAL | 4 refills | Status: DC

## 2019-09-15 MED ORDER — GABAPENTIN 300 MG PO CAPS: 300.0000 mg | ORAL_CAPSULE | Freq: Three times a day (TID) | ORAL | 4 refills | Status: DC

## 2019-09-15 MED ORDER — CYCLOBENZAPRINE HCL 10 MG PO TABS: ORAL_TABLET | ORAL | 4 refills | Status: DC

## 2019-09-15 MED ORDER — ATORVASTATIN CALCIUM 40 MG PO TABS: 40.0000 mg | ORAL_TABLET | Freq: Every day | ORAL | 4 refills | Status: DC

## 2019-09-15 MED ORDER — LEVETIRACETAM 500 MG PO TABS: 500.0000 mg | ORAL_TABLET | Freq: Every day | ORAL | 4 refills | Status: DC

## 2019-09-15 MED ORDER — LORAZEPAM 1 MG PO TABS: 0.5000 mg | ORAL_TABLET | Freq: Every day | ORAL | 0 refills | Status: DC | PRN

## 2019-09-15 NOTE — Assessment & Plan Note (Signed)
A voluntary discussion about advanced care planning including explanation and discussion of advanced directives was extentively discussed with the patient.  Explained about the healthcare proxy and living will was reviewed and packet with forms with expiration of how to fill them out was given.  Time spent: Encounter 16+ min individuals present: Patient 

## 2019-09-15 NOTE — Assessment & Plan Note (Signed)
The current medical regimen is effective;  continue present plan and medications.  

## 2019-09-15 NOTE — Progress Notes (Signed)
There were no vitals taken for this visit.   Subjective:    Patient ID: Amanda Wang, female    DOB: 02/11/1951, 68 y.o.   MRN: [id number]  HPI: Amanda Wang is a 68 y.o. female  Med check Discussed with patient all in all doing well no complaints from blood pressure cholesterol medications taken faithfully without problems allergies doing well Keppra is controlling spells. Gabapentin doing okay for restless legs. Takes rare lorazepam 30 pills lasted 6 months takes 4 severe anxiety  Relevant past medical, surgical, family and social history reviewed and updated as indicated. Interim medical history since our last visit reviewed. Allergies and medications reviewed and updated.  Review of Systems  Constitutional: Negative.   HENT: Negative.   Eyes: Negative.   Respiratory: Negative.   Cardiovascular: Negative.   Gastrointestinal: Negative.   Endocrine: Negative.   Genitourinary: Negative.   Musculoskeletal: Negative.   Skin: Negative.   Allergic/Immunologic: Negative.   Neurological: Negative.   Hematological: Negative.   Psychiatric/Behavioral: Negative.     Per HPI unless specifically indicated above     Objective:    There were no vitals taken for this visit.  Wt Readings from Last 3 Encounters:  06/26/19 152 lb (68.9 kg)  03/17/19 143 lb (64.9 kg)  02/18/19 150 lb 6.4 oz (68.2 kg)    Physical Exam  Results for orders placed or performed during the hospital encounter of 11/28/18  Surgical pathology  Result Value Ref Range   SURGICAL PATHOLOGY      Surgical Pathology CASE: [id number] PATIENT: Amanda Wang Surgical Pathology Report     SPECIMEN SUBMITTED: A. Terminal ileum, random, r/o Crohn's; cbx B. Colon, random, r/o microscopic colitis; cbx C. Colon polyp, transverse; cbx  CLINICAL HISTORY: None provided  PRE-OPERATIVE DIAGNOSIS: Irritable bowel syndrome with diarrhea K58.0  POST-OPERATIVE DIAGNOSIS: Colon polyp      DIAGNOSIS: A. TERMINAL ILEUM, RANDOM; COLD BIOPSY: - MINUTE SUPERFICIAL FRAGMENTS OF UNREMARKABLE ENTERIC MUCOSA DISPLAYING NORMAL VILLOUS ARCHITECTURE. - SINGLE SMALL FRAGMENT OF LAMINA PROPRIA WITH MILD CHRONIC INFLAMMATION AND LYMPHOID AGGREGATE. - NO EVIDENCE OF ENTERITIS/ILEITIS, ARCHITECTURAL CHANGES, DYSPLASIA, OR MALIGNANCY.  B. COLON, RANDOM; COLD BIOPSY: - BENIGN COLONIC MUCOSA WITH NO SIGNIFICANT HISTOPATHOLOGIC CHANGE. - NO EVIDENCE OF ACTIVE COLITIS OR DEFINITIVE FEATURES OF MICROSCOPIC COLITIS. - NEGATIVE FOR DYSPLASIA AND MALIGNANCY.  C. COLON POLYP, TRANS VERSE; COLD BIOPSY: - BENIGN INFLAMMATORY POLYP. - NEGATIVE FOR DYSPLASIA AND MALIGNANCY.   GROSS DESCRIPTION: A. Labeled: C BX random terminal ileum rule out Crohn's Received: Formalin Tissue fragment(s): Multiple Size: Aggregate, 0.5 x 0.2 x 0.1 cm Description: Tan soft tissue fragments Entirely submitted in 1 cassette.  B. Labeled: C BX random colon rule out microscopic colitis Received: Formalin Tissue fragment(s): Multiple Size: Aggregate, 1.7 x 0.4 x 0.1 cm Description: Tan soft tissue fragments Entirely submitted in 1 cassette.  C. Labeled: C BX transverse colon polyp Received: Formalin Tissue fragment(s): 1 Size: 0.4 cm Description: Tan soft tissue fragment Entirely submitted in 1 cassette.   Final Diagnosis performed by Allena Napoleon, MD.   Electronically signed 11/29/2018 12:29:22PM The electronic signature indicates that the named Attending Pathologist has evaluated the specimen  Technical component performed at [facility], [address], [place], [place] [address] Lab: [phone] Dir: Rush Farmer, MD, MMM  Professional component performed at [facility], [facility], [place], [place], [place] [address] Lab: [phone] Dir: Dellia Nims. Reuel Derby, MD       Assessment & Plan:   Problem List Items  Addressed This Visit      Cardiovascular and Mediastinum    Hypertension    The current medical regimen is effective;  continue present plan and medications.       Relevant Medications   benazepril (LOTENSIN) 20 MG tablet   hydrochlorothiazide (HYDRODIURIL) 25 MG tablet   atorvastatin (LIPITOR) 40 MG tablet   amLODipine (NORVASC) 5 MG tablet     Digestive   Irritable bowel syndrome with diarrhea    The current medical regimen is effective;  continue present plan and medications.         Nervous and Auditory   Chiari malformation type I ([place])   Relevant Medications   levETIRAcetam (KEPPRA) 500 MG tablet     Other   Other hyperlipidemia    The current medical regimen is effective;  continue present plan and medications.       Relevant Medications   benazepril (LOTENSIN) 20 MG tablet   hydrochlorothiazide (HYDRODIURIL) 25 MG tablet   atorvastatin (LIPITOR) 40 MG tablet   amLODipine (NORVASC) 5 MG tablet   Depression, recurrent (HCC)    The current medical regimen is effective;  continue present plan and medications.       Relevant Medications   Vilazodone HCl (VIIBRYD) 20 MG TABS   LORazepam (ATIVAN) 1 MG tablet   Restless legs    The current medical regimen is effective;  continue present plan and medications.       Advanced care planning/counseling discussion    A voluntary discussion about advanced care planning including explanation and discussion of advanced directives was extentively discussed with the patient.  Explained about the healthcare proxy and living will was reviewed and packet with forms with expiration of how to fill them out was given.  Time spent: Encounter 16+ min individuals present: Patient       Other Visit Diagnoses    Allergic conjunctivitis of both eyes       Relevant Medications   montelukast (SINGULAIR) 10 MG tablet   Seasonal allergic rhinitis due to other allergic trigger       Relevant Medications   montelukast (SINGULAIR) 10 MG tablet   Hyperlipidemia, unspecified hyperlipidemia type        Relevant Medications   benazepril (LOTENSIN) 20 MG tablet   hydrochlorothiazide (HYDRODIURIL) 25 MG tablet   atorvastatin (LIPITOR) 40 MG tablet   amLODipine (NORVASC) 5 MG tablet   Recurrent major depressive disorder, in full remission (HCC)       Relevant Medications   Vilazodone HCl (VIIBRYD) 20 MG TABS   LORazepam (ATIVAN) 1 MG tablet      Telemedicine using audio/video telecommunications for a synchronous communication visit. Today's visit due to COVID-19 isolation precautions I connected with and verified that I am speaking with the correct person using two identifiers.   I discussed the limitations, risks, security and privacy concerns of performing an evaluation and management service by telecommunication and the availability of in person appointments. I also discussed with the patient that there may be a patient responsible charge related to this service. The patient expressed understanding and agreed to proceed. The patient's location is home. I am at home.   I discussed the assessment and treatment plan with the patient. The patient was provided an opportunity to ask questions and all were answered. The patient agreed with the plan and demonstrated an understanding of the instructions.   The patient was advised to call back or seek an in-person evaluation if the symptoms worsen or  if the condition fails to improve as anticipated.   I provided 21+ minutes of time during this encounter. Follow up plan: Return in about 6 months (around 03/14/2020) for BMP,  Lipids, ALT, AST.

## 2019-09-16 LAB — CBC WITH DIFFERENTIAL/PLATELET
Basophils Absolute: 0.1 10*3/uL (ref 0.0–0.2)
Basos: 1 %
EOS (ABSOLUTE): 0.2 10*3/uL (ref 0.0–0.4)
Eos: 2 %
Hematocrit: 33.9 % — ABNORMAL LOW (ref 34.0–46.6)
Hemoglobin: 11.1 g/dL (ref 11.1–15.9)
Immature Grans (Abs): 0 10*3/uL (ref 0.0–0.1)
Immature Granulocytes: 0 %
Lymphocytes Absolute: 1.7 10*3/uL (ref 0.7–3.1)
Lymphs: 22 %
MCH: 26.9 pg (ref 26.6–33.0)
MCHC: 32.7 g/dL (ref 31.5–35.7)
MCV: 82 fL (ref 79–97)
Monocytes Absolute: 0.5 10*3/uL (ref 0.1–0.9)
Monocytes: 6 %
Neutrophils Absolute: 5.3 10*3/uL (ref 1.4–7.0)
Neutrophils: 69 %
Platelets: 342 10*3/uL (ref 150–450)
RBC: 4.12 x10E6/uL (ref 3.77–5.28)
RDW: 14 % (ref 11.7–15.4)
WBC: 7.7 10*3/uL (ref 3.4–10.8)

## 2019-09-16 LAB — URINALYSIS, ROUTINE W REFLEX MICROSCOPIC
Bilirubin, UA: NEGATIVE
Glucose, UA: NEGATIVE
Leukocytes,UA: NEGATIVE
Nitrite, UA: NEGATIVE
Protein,UA: NEGATIVE
RBC, UA: NEGATIVE
Specific Gravity, UA: 1.025 (ref 1.005–1.030)
Urobilinogen, Ur: 1 mg/dL (ref 0.2–1.0)
pH, UA: 5 (ref 5.0–7.5)

## 2019-09-16 LAB — LIPID PANEL
Chol/HDL Ratio: 2.7 ratio (ref 0.0–4.4)
Cholesterol, Total: 168 mg/dL (ref 100–199)
HDL: 62 mg/dL (ref >39)
LDL Chol Calc (NIH): 74 mg/dL (ref 0–99)
Triglycerides: 196 mg/dL — ABNORMAL HIGH (ref 0–149)
VLDL Cholesterol Cal: 32 mg/dL (ref 5–40)

## 2019-09-16 LAB — COMPREHENSIVE METABOLIC PANEL
ALT: 11 IU/L (ref 0–32)
AST: 18 IU/L (ref 0–40)
Albumin/Globulin Ratio: 1.4 (ref 1.2–2.2)
Albumin: 4.4 g/dL (ref 3.8–4.8)
Alkaline Phosphatase: 126 IU/L — ABNORMAL HIGH (ref 39–117)
BUN/Creatinine Ratio: 15 (ref 12–28)
BUN: 15 mg/dL (ref 8–27)
Bilirubin Total: 0.2 mg/dL (ref 0.0–1.2)
CO2: 26 mmol/L (ref 20–29)
Calcium: 9.5 mg/dL (ref 8.7–10.3)
Chloride: 100 mmol/L (ref 96–106)
Creatinine, Ser: 1.01 mg/dL — ABNORMAL HIGH (ref 0.57–1.00)
GFR calc Af Amer: 67 mL/min/{1.73_m2} (ref >59)
GFR calc non Af Amer: 58 mL/min/{1.73_m2} — ABNORMAL LOW (ref >59)
Globulin, Total: 3.1 g/dL (ref 1.5–4.5)
Glucose: 134 mg/dL — ABNORMAL HIGH (ref 65–99)
Potassium: 3.8 mmol/L (ref 3.5–5.2)
Sodium: 141 mmol/L (ref 134–144)
Total Protein: 7.5 g/dL (ref 6.0–8.5)

## 2019-09-16 LAB — TSH: TSH: 1.24 u[IU]/mL (ref 0.450–4.500)

## 2019-09-16 IMAGING — CR DG UGI W/ SMALL BOWEL
3 series · 7 of 24 positions shown · non-contrast
Comparison: 07/24/2012

CLINICAL DATA: Cyclical vomiting with nausea

EXAM:
UPPER GI SERIES WITH SMALL BOWEL FOLLOW-THROUGH
FLUOROSCOPY TIME:  Fluoroscopy Time:  12 seconds
Radiation Exposure Index (if provided by the fluoroscopic device):
13.2 mGy
Number of Acquired Spot Images: 0
TECHNIQUE: Combined double contrast and single contrast upper GI series using
effervescent crystals, thick barium, and thin barium. Subsequently,
serial images of the small bowel were obtained including spot views
of the terminal ileum.

[Series 1: t abdomen supine · 0.14mm/px · 2 of 3 slices shown (1 of 2)]
[im 1/3]
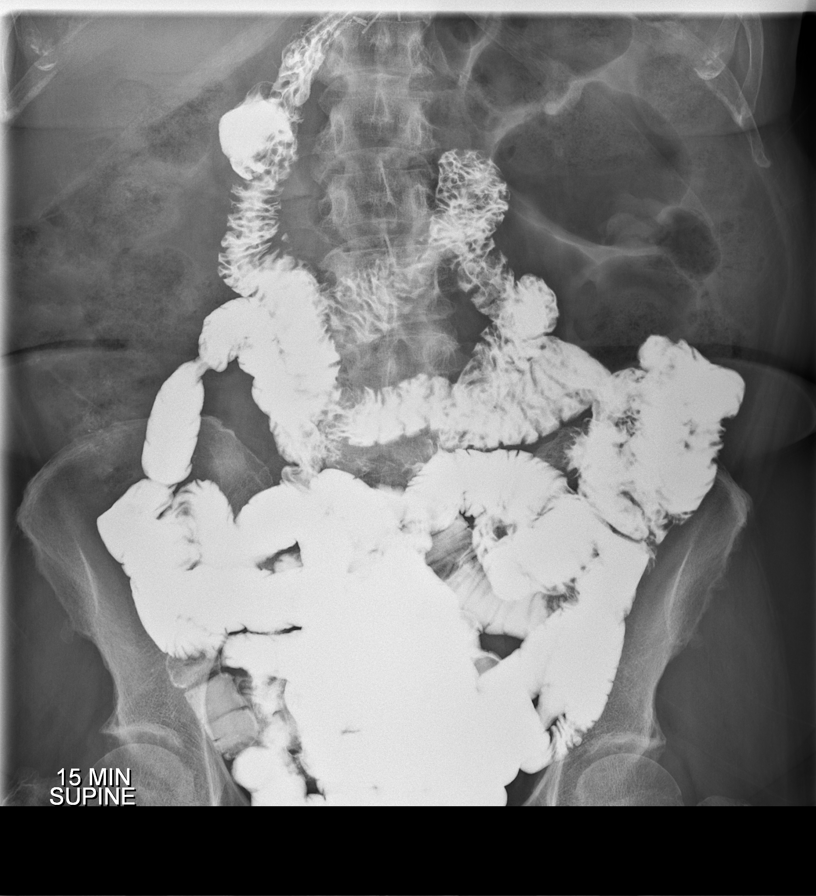
[im 3/3]
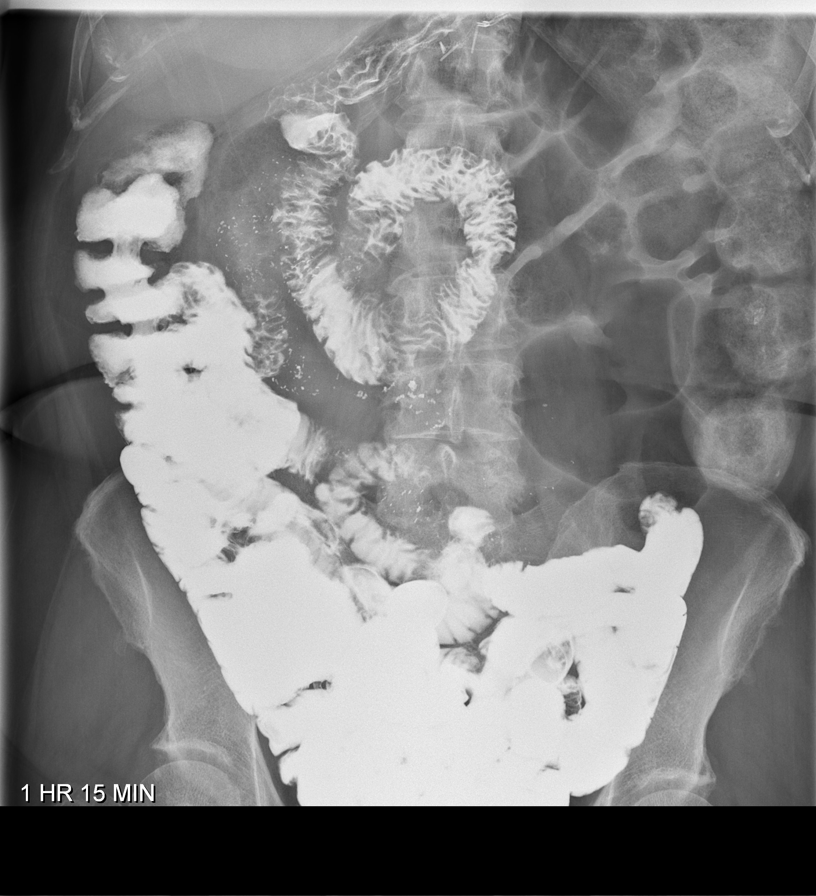

[Series 2: t abdomen supine · 0.14mm/px · 2 of 3 slices shown (2 of 2)]
[im 1/3]
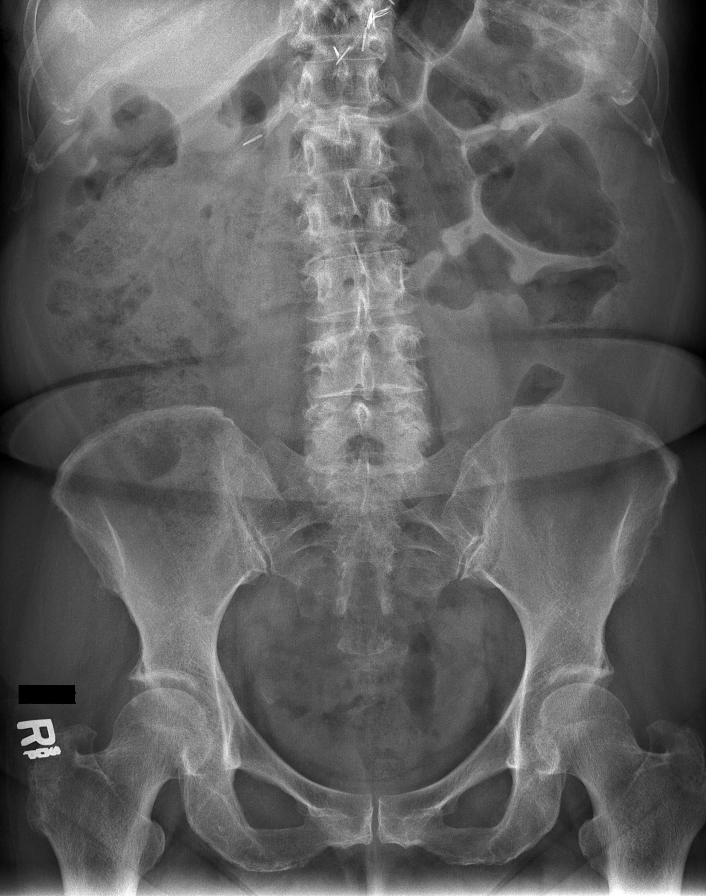
[im 3/3]
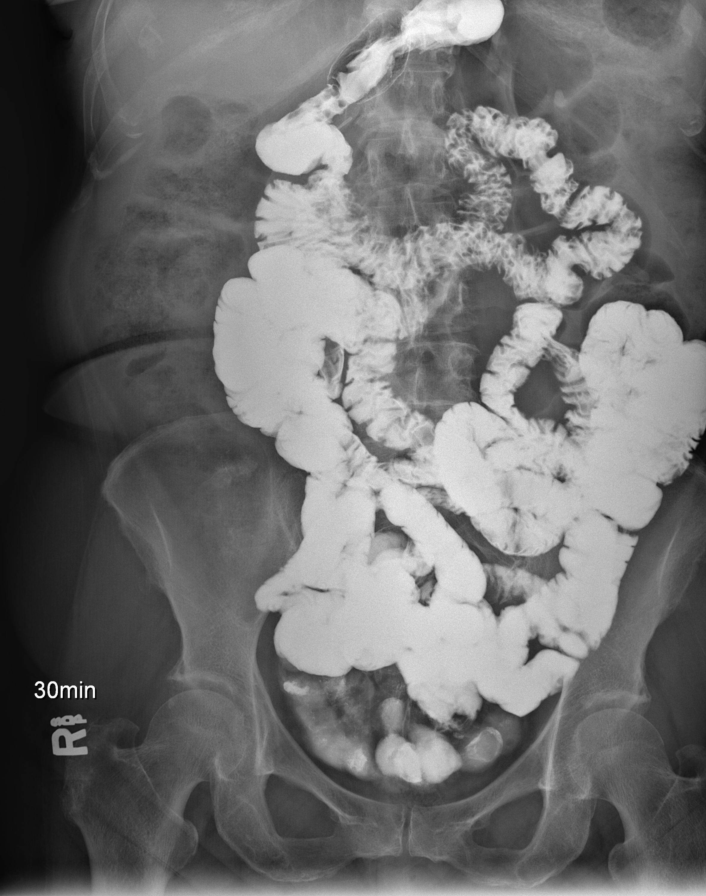

[Series 4: cp_standard · 0.25mm/px · 3 of 28 slices shown]
[im 1/28]
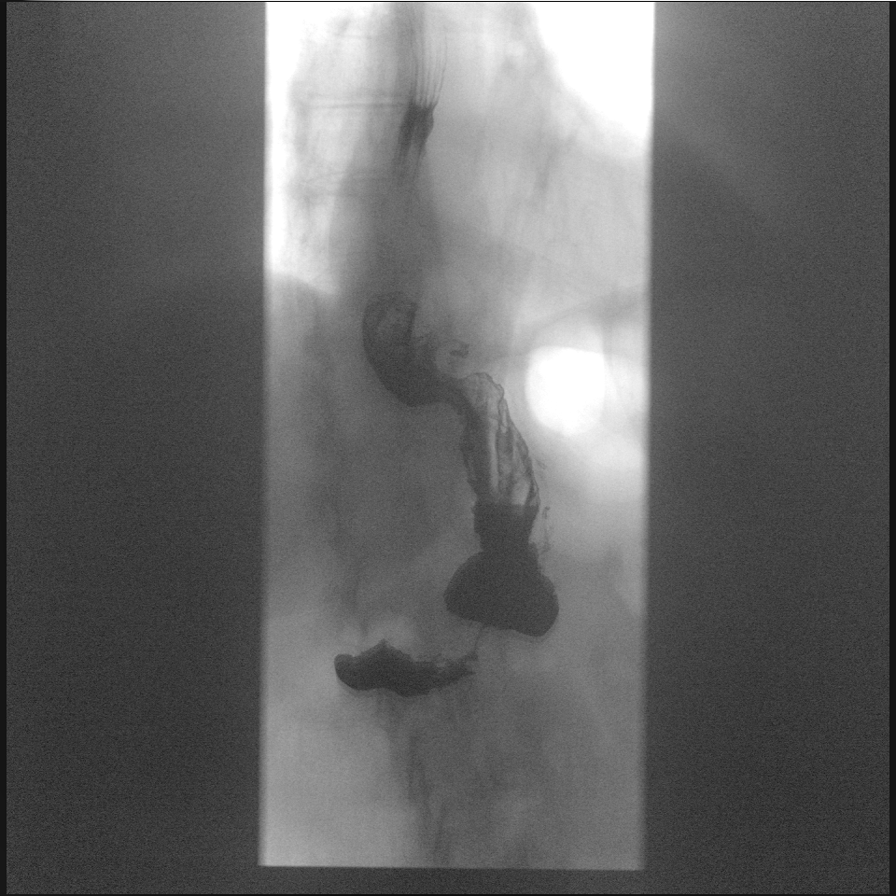
[im 2/28]
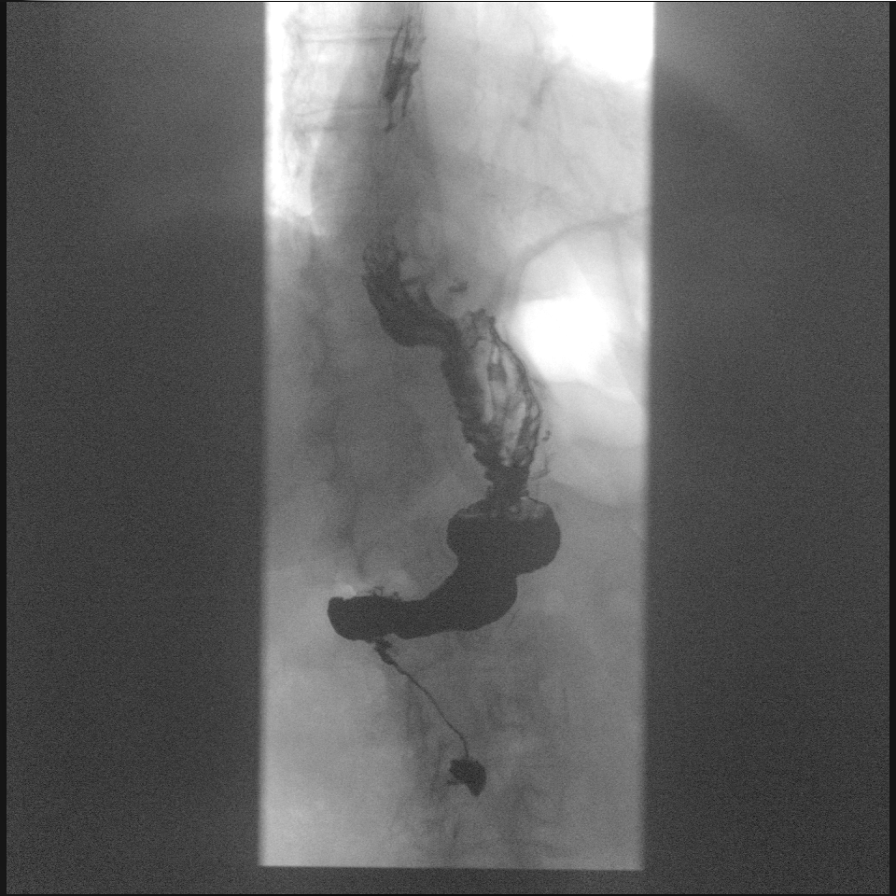
[im 3/28]
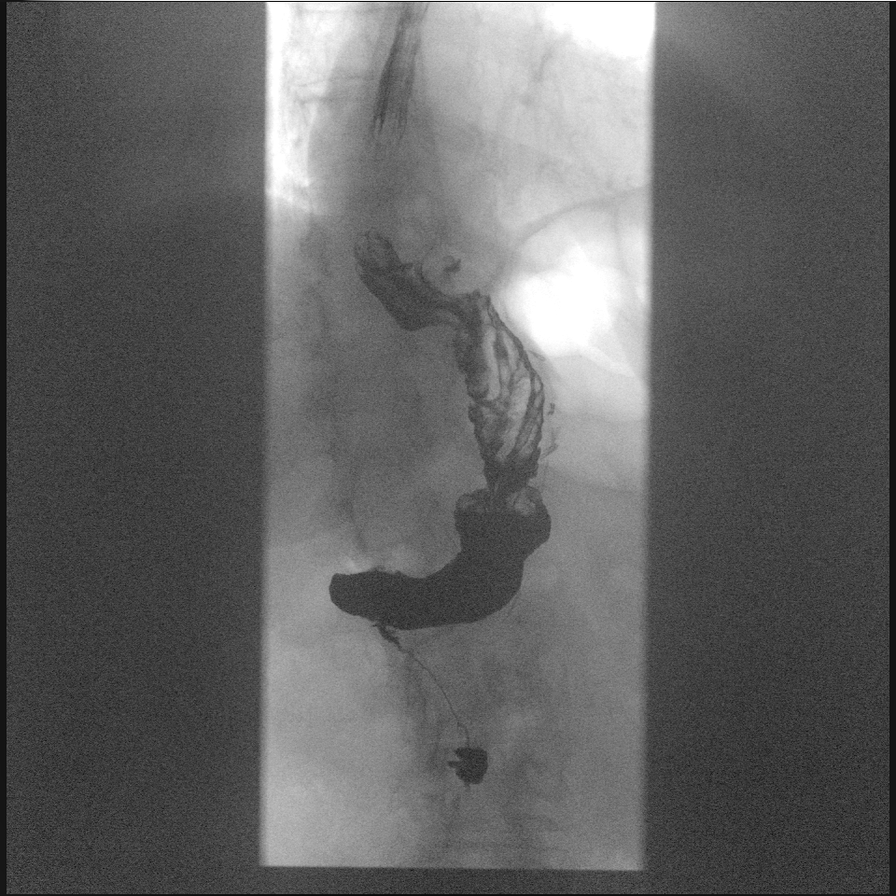

[7 of 24 positions shown; findings below may reference images not displayed]

FINDINGS: Examination of the esophagus demonstrated normal esophageal
motility. Normal esophageal morphology without evidence of
esophagitis or ulceration. No esophageal stricture, diverticula, or
mass lesion. Moderate-sized hiatal hernia. Moderate severity
gastroesophageal reflux.

Patient is status post remote gastric sleeve procedure in 0530.
Examination of the stomach demonstrated normal rugal folds and areae
gastricae. The gastric mucosa appeared unremarkable without evidence
of ulceration, scarring, or mass lesion. Gastric motility and
emptying was normal. Fluoroscopic examination of the duodenum
demonstrates normal motility and morphology without evidence of
ulceration or mass lesion.

Medium density barium was periodically observed under fluoroscopy to
travel from the stomach to the ascending colon (over a 75 minute
time period). There is no evidence of small bowel stricture or
obstruction. No large filling defects to suggest mass lesion. In
addition, there is no evidence of tethering or definite inflammatory
changes present within the small bowel.
IMPRESSION: 1. Moderate size hiatal hernia. Moderate severity gastroesophageal
reflux.
2. Status post gastric sleeve procedure in 0530. No focal gastric
abnormality. No gastric outlet obstruction.
3. Normal small bowel follow-through.

## 2019-09-17 ENCOUNTER — Encounter: Payer: Self-pay | Admitting: Family Medicine

## 2019-09-17 ENCOUNTER — Other Ambulatory Visit: Payer: Self-pay | Admitting: Family Medicine

## 2019-09-17 DIAGNOSIS — N183 Chronic kidney disease, stage 3 unspecified: Secondary | ICD-10-CM

## 2019-09-17 DIAGNOSIS — D649 Anemia, unspecified: Secondary | ICD-10-CM

## 2019-09-17 IMAGING — NM NM GASTRIC EMPTYING
1 series · 10 of 10 positions shown · non-contrast
Comparison: None

CLINICAL DATA: Non intractable sickle vomiting with nausea

EXAM:
NUCLEAR MEDICINE GASTRIC EMPTYING SCAN
TECHNIQUE: After oral ingestion of radiolabeled meal, sequential abdominal
images were obtained for 4 hours. Percentage of activity emptying
the stomach was calculated at 1 hour, 2 hour, 3 hour, and 4 hours.
RADIOPHARMACEUTICALS:  2.5 mCi 9c-33m sulfur colloid in standardized
meal

[Series 1000: gatric statics (results) · 3.90mm/px · 5 acquisitions, 10 frames shown]
[im 1/5]
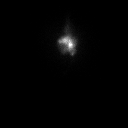
[im 1/5]
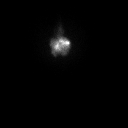
[im 2/5]
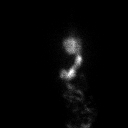
[im 2/5]
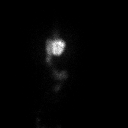
[im 3/5]
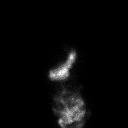
[im 3/5]
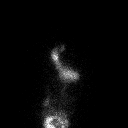
[im 4/5]
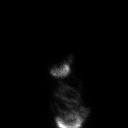
[im 4/5]
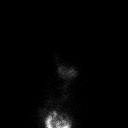
[im 5/5]
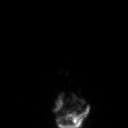
[im 5/5]
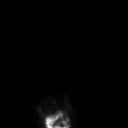

[10 of 10 positions shown; findings below may reference images not displayed]

FINDINGS: Expected location of the stomach in the left upper quadrant.

Ingested meal empties the stomach gradually over the course of the
study.

12% emptied at 1 hr ( normal >= 10%)

52% emptied at 2 hr ( normal >= 40%)

77% emptied at 3 hr ( normal >= 70%)

99% emptied at 4 hr ( normal >= 90%)
IMPRESSION: Normal gastric emptying study.

## 2019-09-22 ENCOUNTER — Ambulatory Visit (INDEPENDENT_AMBULATORY_CARE_PROVIDER_SITE_OTHER): Payer: Medicare Other

## 2019-09-22 ENCOUNTER — Encounter: Payer: Self-pay | Admitting: Nurse Practitioner

## 2019-09-22 VITALS — Ht 62.0 in | Wt 158.0 lb

## 2019-09-22 DIAGNOSIS — Z0000 Encounter for general adult medical examination without abnormal findings: Secondary | ICD-10-CM | POA: Diagnosis not present

## 2019-09-22 NOTE — Patient Instructions (Signed)
Amanda Wang , Thank you for taking time to come for your Medicare Wellness Visit. I appreciate your ongoing commitment to your health goals. Please review the following plan we discussed and let me know if I can assist you in the future.   Screening recommendations/referrals: Colonoscopy: completed 11/2018 Mammogram: completed 08/2019 Bone Density: up to date Recommended yearly ophthalmology/optometry visit for glaucoma screening and checkup Recommended yearly dental visit for hygiene and checkup  Vaccinations: Influenza vaccine:  Due, scheduled for Friday 10/9 Pneumococcal vaccine: scheduled for Friday 10/9 Tdap vaccine: up to date Shingles vaccine: shingrix eligible     Advanced directives: Please bring a copy of your health care power of attorney and living will to the office at your convenience.  Conditions/risks identified: fall risk, discussed fall preventions below  Next appointment: follow up in one year for your annual wellness visit   Preventive Care 65 Years and Older, Female Preventive care refers to lifestyle choices and visits with your health care provider that can promote health and wellness. What does preventive care include?  A yearly physical exam. This is also called an annual well check.  Dental exams once or twice a year.  Routine eye exams. Ask your health care provider how often you should have your eyes checked.  Personal lifestyle choices, including:  Daily care of your teeth and gums.  Regular physical activity.  Eating a healthy diet.  Avoiding tobacco and drug use.  Limiting alcohol use.  Practicing safe sex.  Taking low-dose aspirin every day.  Taking vitamin and mineral supplements as recommended by your health care provider. What happens during an annual well check? The services and screenings done by your health care provider during your annual well check will depend on your age, overall health, lifestyle risk factors, and family  history of disease. Counseling  Your health care provider may ask you questions about your:  Alcohol use.  Tobacco use.  Drug use.  Emotional well-being.  Home and relationship well-being.  Sexual activity.  Eating habits.  History of falls.  Memory and ability to understand (cognition).  Work and work Statistician.  Reproductive health. Screening  You may have the following tests or measurements:  Height, weight, and BMI.  Blood pressure.  Lipid and cholesterol levels. These may be checked every 5 years, or more frequently if you are over 32 years old.  Skin check.  Lung cancer screening. You may have this screening every year starting at age 55 if you have a 30-pack-year history of smoking and currently smoke or have quit within the past 15 years.  Fecal occult blood test (FOBT) of the stool. You may have this test every year starting at age 52.  Flexible sigmoidoscopy or colonoscopy. You may have a sigmoidoscopy every 5 years or a colonoscopy every 10 years starting at age 30.  Hepatitis C blood test.  Hepatitis B blood test.  Sexually transmitted disease (STD) testing.  Diabetes screening. This is done by checking your blood sugar (glucose) after you have not eaten for a while (fasting). You may have this done every 1-3 years.  Bone density scan. This is done to screen for osteoporosis. You may have this done starting at age 26.  Mammogram. This may be done every 1-2 years. Talk to your health care provider about how often you should have regular mammograms. Talk with your health care provider about your test results, treatment options, and if necessary, the need for more tests. Vaccines  Your health care provider  may recommend certain vaccines, such as:  Influenza vaccine. This is recommended every year.  Tetanus, diphtheria, and acellular pertussis (Tdap, Td) vaccine. You may need a Td booster every 10 years.  Zoster vaccine. You may need this after  age 76.  Pneumococcal 13-valent conjugate (PCV13) vaccine. One dose is recommended after age 13.  Pneumococcal polysaccharide (PPSV23) vaccine. One dose is recommended after age 68. Talk to your health care provider about which screenings and vaccines you need and how often you need them. This information is not intended to replace advice given to you by your health care provider. Make sure you discuss any questions you have with your health care provider. Document Released: 12/31/2015 Document Revised: 08/23/2016 Document Reviewed: 10/05/2015 Elsevier Interactive Patient Education  2017 [place] Prevention in the Home Falls can cause injuries. They can happen to people of all ages. There are many things you can do to make your home safe and to help prevent falls. What can I do on the outside of my home?  Regularly fix the edges of walkways and driveways and fix any cracks.  Remove anything that might make you trip as you walk through a door, such as a raised step or threshold.  Trim any bushes or trees on the path to your home.  Use bright outdoor lighting.  Clear any walking paths of anything that might make someone trip, such as rocks or tools.  Regularly check to see if handrails are loose or broken. Make sure that both sides of any steps have handrails.  Any raised decks and porches should have guardrails on the edges.  Have any leaves, snow, or ice cleared regularly.  Use sand or salt on walking paths during winter.  Clean up any spills in your garage right away. This includes oil or grease spills. What can I do in the bathroom?  Use night lights.  Install grab bars by the toilet and in the tub and shower. Do not use towel bars as grab bars.  Use non-skid mats or decals in the tub or shower.  If you need to sit down in the shower, use a plastic, non-slip stool.  Keep the floor dry. Clean up any water that spills on the floor as soon as it happens.   Remove soap buildup in the tub or shower regularly.  Attach bath mats securely with double-sided non-slip rug tape.  Do not have throw rugs and other things on the floor that can make you trip. What can I do in the bedroom?  Use night lights.  Make sure that you have a light by your bed that is easy to reach.  Do not use any sheets or blankets that are too big for your bed. They should not hang down onto the floor.  Have a firm chair that has side arms. You can use this for support while you get dressed.  Do not have throw rugs and other things on the floor that can make you trip. What can I do in the kitchen?  Clean up any spills right away.  Avoid walking on wet floors.  Keep items that you use a lot in easy-to-reach places.  If you need to reach something above you, use a strong step stool that has a grab bar.  Keep electrical cords out of the way.  Do not use floor polish or wax that makes floors slippery. If you must use wax, use non-skid floor wax.  Do not have throw rugs  and other things on the floor that can make you trip. What can I do with my stairs?  Do not leave any items on the stairs.  Make sure that there are handrails on both sides of the stairs and use them. Fix handrails that are broken or loose. Make sure that handrails are as long as the stairways.  Check any carpeting to make sure that it is firmly attached to the stairs. Fix any carpet that is loose or worn.  Avoid having throw rugs at the top or bottom of the stairs. If you do have throw rugs, attach them to the floor with carpet tape.  Make sure that you have a light switch at the top of the stairs and the bottom of the stairs. If you do not have them, ask someone to add them for you. What else can I do to help prevent falls?  Wear shoes that:  Do not have high heels.  Have rubber bottoms.  Are comfortable and fit you well.  Are closed at the toe. Do not wear sandals.  If you use a  stepladder:  Make sure that it is fully opened. Do not climb a closed stepladder.  Make sure that both sides of the stepladder are locked into place.  Ask someone to hold it for you, if possible.  Clearly mark and make sure that you can see:  Any grab bars or handrails.  First and last steps.  Where the edge of each step is.  Use tools that help you move around (mobility aids) if they are needed. These include:  Canes.  Walkers.  Scooters.  Crutches.  Turn on the lights when you go into a dark area. Replace any light bulbs as soon as they burn out.  Set up your furniture so you have a clear path. Avoid moving your furniture around.  If any of your floors are uneven, fix them.  If there are any pets around you, be aware of where they are.  Review your medicines with your doctor. Some medicines can make you feel dizzy. This can increase your chance of falling. Ask your doctor what other things that you can do to help prevent falls. This information is not intended to replace advice given to you by your health care provider. Make sure you discuss any questions you have with your health care provider. Document Released: 09/30/2009 Document Revised: 05/11/2016 Document Reviewed: 01/08/2015 Elsevier Interactive Patient Education  2017 [employer].

## 2019-09-22 NOTE — Progress Notes (Signed)
Subjective:   Amanda Wang is a 68 y.o. female who presents for Medicare Annual (Subsequent) preventive examination.  This visit is being conducted via phone call  - after an attmept to do on video chat - due to the COVID-19 pandemic. This patient has given me verbal consent via phone to conduct this visit, patient states they are participating from their home address. Some vital signs may be absent or patient reported.   Patient identification: identified by name, DOB, and current address.    Review of Systems:         Objective:     Vitals: Ht 5\' 2"  (1.575 m) Comment: pt reported  Wt 158 lb (71.7 kg) Comment: pt reported  BMI 28.90 kg/m   Body mass index is 28.9 kg/m.  Advanced Directives 09/22/2019 11/28/2018  Does Patient Have a Medical Advance Directive? Yes Yes  Type of Advance Directive Living will;Healthcare Power of [place];Living will  Copy of [place] in Chart? No - copy requested No - copy requested    Tobacco Social History   Tobacco Use  Smoking Status Former Smoker  . Types: Cigarettes  . Quit date: 07/26/1977  . Years since quitting: 42.1  Smokeless Tobacco Never Used  Tobacco Comment   barley smoked when smoked      Counseling given: Not Answered Comment: barley smoked when smoked    Clinical Intake:  Pre-visit preparation completed: Yes  Pain : 0-10 Pain Score: 6  Pain Type: Chronic pain Pain Location: Abdomen Pain Descriptors / Indicators: Discomfort, Aching Pain Onset: More than a month ago Pain Frequency: Constant     Nutritional Status: BMI 25 -29 Overweight Nutritional Risks: Nausea/ vomitting/ diarrhea(diarrhea) Diabetes: No  How often do you need to have someone help you when you read instructions, pamphlets, or other written materials from your doctor or pharmacy?: 1 - Never What is the last grade level you completed in school?: bachelors  Interpreter Needed?: No   Information entered by :: Neria Procter,LPN  Past Medical History:  Diagnosis Date  . Arthritis   . Chiari malformation type I ([place])   . Depression   . GERD (gastroesophageal reflux disease)   . History of hiatal hernia   . Hyperlipidemia   . Hypertension   . Sleep apnea   . Small intestinal bacterial overgrowth   . Spinal stenosis    Past Surgical History:  Procedure Laterality Date  . BREAST BIOPSY Right    neg  . chiari malformation I decompression surgery  11-06  . COLONOSCOPY WITH PROPOFOL N/A 11/28/2018   Procedure: COLONOSCOPY WITH PROPOFOL;  Surgeon: Jonathon Bellows, MD;  Location: [facility]COPY;  Service: Gastroenterology;  Laterality: N/A;  . deviated septum repair    . HERNIA REPAIR     Family History  Problem Relation Age of Onset  . Hypertension Mother   . Cancer Mother   . Stroke Mother   . Heart disease Father   . Breast cancer Sister 105  . Breast cancer Paternal Aunt    Social History   Socioeconomic History  . Marital status: Divorced    Spouse name: Not on file  . Number of children: Not on file  . Years of education: Not on file  . Highest education level: Bachelor's degree (e.g., BA, AB, BS)  Occupational History  . Occupation: part time   Social Needs  . Financial resource strain: Not hard at all  . Food insecurity  Worry: Never true    Inability: Never true  . Transportation needs    Medical: No    Non-medical: No  Tobacco Use  . Smoking status: Former Smoker    Types: Cigarettes    Quit date: 07/26/1977    Years since quitting: 42.1  . Smokeless tobacco: Never Used  . Tobacco comment: barley smoked when smoked   Substance and Sexual Activity  . Alcohol use: Yes    Alcohol/week: 0.0 standard drinks    Comment: on occasion  . Drug use: No  . Sexual activity: Not on file  Lifestyle  . Physical activity    Days per week: 7 days    Minutes per session: 50 min  . Stress: Not at all  Relationships  . Social connections    Talks on  phone: More than three times a week    Gets together: More than three times a week    Attends religious service: More than 4 times per year    Active member of club or organization: Yes    Attends meetings of clubs or organizations: More than 4 times per year    Relationship status: Divorced  Other Topics Concern  . Not on file  Social History Narrative  . Not on file    Outpatient Encounter Medications as of 09/22/2019  Medication Sig  . amLODipine (NORVASC) 5 MG tablet Take 1 tablet (5 mg total) by mouth daily.  Marland Kitchen atorvastatin (LIPITOR) 40 MG tablet Take 1 tablet (40 mg total) by mouth daily at 6 PM.  . benazepril (LOTENSIN) 20 MG tablet Take 1 tablet (20 mg total) by mouth daily.  . cyclobenzaprine (FLEXERIL) 10 MG tablet take 1 tablet by mouth three times a day if needed for muscle spasm  . gabapentin (NEURONTIN) 300 MG capsule Take 1 capsule (300 mg total) by mouth 3 (three) times daily.  . hydrochlorothiazide (HYDRODIURIL) 25 MG tablet Take 1 tablet (25 mg total) by mouth daily.  Marland Kitchen levETIRAcetam (KEPPRA) 500 MG tablet Take 1 tablet (500 mg total) by mouth daily.  Marland Kitchen LORazepam (ATIVAN) 1 MG tablet Take 0.5-1 tablets (0.5-1 mg total) by mouth daily as needed for anxiety.  . montelukast (SINGULAIR) 10 MG tablet Take 1 tablet (10 mg total) by mouth at bedtime.  . rifaximin (XIFAXAN) 550 MG TABS tablet Take 1 tablet by mouth daily as needed.  . Vilazodone HCl (VIIBRYD) 20 MG TABS Take 1 tablet (20 mg total) by mouth daily.   No facility-administered encounter medications on file as of 09/22/2019.     Activities of Daily Living In your present state of health, do you have any difficulty performing the following activities: 09/22/2019  Hearing? N  Comment no hearing aids  Vision? N  Comment reading glasses, dr.woodard annually  Difficulty concentrating or making decisions? N  Walking or climbing stairs? Y  Dressing or bathing? N  Doing errands, shopping? N  Preparing Food and eating  ? N  Using the Toilet? N  In the past six months, have you accidently leaked urine? N  Do you have problems with loss of bowel control? N  Managing your Medications? N  Managing your Finances? N  Housekeeping or managing your Housekeeping? N  Some recent data might be hidden    Patient Care Team: Guadalupe Maple, MD as PCP - General (Family Medicine) Lucilla Lame, MD as Consulting Physician (Gastroenterology)    Assessment:   This is a routine wellness examination for [place].  Exercise Activities and  Dietary recommendations Current Exercise Habits: The patient has a physically strenuous job, but has no regular exercise apart from work., Type of exercise: walking, Intensity: Mild, Exercise limited by: None identified  Goals   None     Fall Risk: Fall Risk  09/22/2019 03/17/2019 09/16/2018 03/07/2018 11/23/2017  Falls in the past year? 1 0 No No No  Number falls in past yr: 1 - - - -  Injury with Fall? 0 - - - -  Risk Factor Category  - - - - -  Risk for fall due to : Impaired balance/gait - - - -  Follow up - Falls evaluation completed - - -    FALL RISK PREVENTION PERTAINING TO THE HOME:  Any stairs in or around the home? Yes  If so, are there any without handrails? No   Home free of loose throw rugs in walkways, pet beds, electrical cords, etc? Yes  Adequate lighting in your home to reduce risk of falls? Yes   ASSISTIVE DEVICES UTILIZED TO PREVENT FALLS:  Life alert? No  Use of a cane, walker or w/c? No  Grab bars in the bathroom? No  Shower chair or bench in shower? No  Elevated toilet seat or a handicapped toilet? No   DME ORDERS:  DME order needed?  No   TIMED UP AND GO:  Unable to perform    Depression Screen PHQ 2/9 Scores 09/22/2019 09/16/2018 05/27/2018 11/23/2017  PHQ - 2 Score 0 0 1 0  PHQ- 9 Score - - 3 -     Cognitive Function        Immunization History  Administered Date(s) Administered  . Influenza, High Dose Seasonal PF 09/16/2018  .  Influenza,inj,Quad PF,6+ Mos 09/26/2016, 10/12/2017  . Influenza-Unspecified 10/20/2015, 10/12/2017  . Td 01/01/2006  . Tdap 07/31/2016  . Zoster 07/10/2013    Qualifies for Shingles Vaccine? Yes  Zostavax completed n/a. Due for Shingrix. Education has been provided regarding the importance of this vaccine. Pt has been advised to call insurance company to determine out of pocket expense. Advised may also receive vaccine at local pharmacy or Health Dept. Verbalized acceptance and understanding.   Tdap: up to date   Flu Vaccine: Due for Flu vaccine. Pt coming Friday   Pneumococcal Vaccine: due now, will get Friday   Screening Tests Health Maintenance  Topic Date Due  . INFLUENZA VACCINE  07/19/2019  . PNA vac Low Risk Adult (1 of 2 - PCV13) 03/16/2020 (Originally 11/18/2017)  . DEXA SCAN  09/21/2020 (Originally 11/18/2017)  . MAMMOGRAM  08/21/2021  . TETANUS/TDAP  07/31/2026  . COLONOSCOPY  11/28/2028  . Hepatitis C Screening  Completed    Cancer Screenings:  Colorectal Screening: Completed 11/28/2018.  Mammogram: Completed 08/22/2019. Repeat every year  Bone Density: Completed per MD.   Lung Cancer Screening: (Low Dose CT Chest recommended if Age 30-80 years, 30 pack-year currently smoking OR have quit w/in 15years.) does not qualify.    Additional Screening:  Hepatitis C Screening: does qualify; Completed 07/31/2016  Vision Screening: Recommended annual ophthalmology exams for early detection of glaucoma and other disorders of the eye. Is the patient up to date with their annual eye exam?  Yes  Who is the provider or what is the name of the office in which the pt attends annual eye exams? Dr.Woodard  Dental Screening: Recommended annual dental exams for proper oral hygiene  Community Resource Referral:  CRR required this visit?  No  Plan:  I have personally reviewed and addressed the Medicare Annual Wellness questionnaire and have noted the following in the  patient's chart:  A. Medical and social history B. Use of alcohol, tobacco or illicit drugs  C. Current medications and supplements D. Functional ability and status E.  Nutritional status F.  Physical activity G. Advance directives H. List of other physicians I.  Hospitalizations, surgeries, and ER visits in previous 12 months J.  [place] such as hearing and vision if needed, cognitive and depression L. Referrals and appointments   In addition, I have reviewed and discussed with patient certain preventive protocols, quality metrics, and best practice recommendations. A written personalized care plan for preventive services as well as general preventive health recommendations were provided to patient.  Signed,    Bevelyn Ngo, LPN  [id number] Nurse Health Advisor   Nurse Notes: none

## 2019-09-23 DIAGNOSIS — F411 Generalized anxiety disorder: Secondary | ICD-10-CM | POA: Diagnosis not present

## 2019-09-26 ENCOUNTER — Ambulatory Visit (INDEPENDENT_AMBULATORY_CARE_PROVIDER_SITE_OTHER): Payer: Medicare Other

## 2019-09-26 ENCOUNTER — Other Ambulatory Visit: Payer: Self-pay

## 2019-09-26 DIAGNOSIS — Z23 Encounter for immunization: Secondary | ICD-10-CM

## 2019-09-30 DIAGNOSIS — F411 Generalized anxiety disorder: Secondary | ICD-10-CM | POA: Diagnosis not present

## 2019-10-07 DIAGNOSIS — F411 Generalized anxiety disorder: Secondary | ICD-10-CM | POA: Diagnosis not present

## 2019-10-14 DIAGNOSIS — F411 Generalized anxiety disorder: Secondary | ICD-10-CM | POA: Diagnosis not present

## 2019-10-21 DIAGNOSIS — F411 Generalized anxiety disorder: Secondary | ICD-10-CM | POA: Diagnosis not present

## 2019-10-28 DIAGNOSIS — F411 Generalized anxiety disorder: Secondary | ICD-10-CM | POA: Diagnosis not present

## 2019-11-04 DIAGNOSIS — F411 Generalized anxiety disorder: Secondary | ICD-10-CM | POA: Diagnosis not present

## 2019-11-11 DIAGNOSIS — F411 Generalized anxiety disorder: Secondary | ICD-10-CM | POA: Diagnosis not present

## 2019-11-18 DIAGNOSIS — F411 Generalized anxiety disorder: Secondary | ICD-10-CM | POA: Diagnosis not present

## 2019-11-25 DIAGNOSIS — F411 Generalized anxiety disorder: Secondary | ICD-10-CM | POA: Diagnosis not present

## 2019-12-23 DIAGNOSIS — F411 Generalized anxiety disorder: Secondary | ICD-10-CM | POA: Diagnosis not present

## 2020-01-06 DIAGNOSIS — F411 Generalized anxiety disorder: Secondary | ICD-10-CM | POA: Diagnosis not present

## 2020-01-20 DIAGNOSIS — F411 Generalized anxiety disorder: Secondary | ICD-10-CM | POA: Diagnosis not present

## 2020-01-27 DIAGNOSIS — F411 Generalized anxiety disorder: Secondary | ICD-10-CM | POA: Diagnosis not present

## 2020-02-06 ENCOUNTER — Telehealth: Payer: Self-pay

## 2020-02-06 NOTE — Telephone Encounter (Signed)
Prior Authorization initiated via CoverMyMeds for [employer]: [employer]

## 2020-02-09 ENCOUNTER — Telehealth: Payer: Self-pay | Admitting: Family Medicine

## 2020-02-09 NOTE — Telephone Encounter (Signed)
Pt insurance will no longer cover Vilazodone HCl (VIIBRYD) 20 MG TABS  They recommend Pt try Trazadone , Trentelix or Nefazodone. Please advise

## 2020-02-09 NOTE — Telephone Encounter (Signed)
Routing to provider  

## 2020-02-10 DIAGNOSIS — F411 Generalized anxiety disorder: Secondary | ICD-10-CM | POA: Diagnosis not present

## 2020-02-10 NOTE — Telephone Encounter (Signed)
If she does not have enough medicine to last until 3/16- she should be seen before.

## 2020-02-10 NOTE — Telephone Encounter (Signed)
Scheduled for 2/25 with Janett Billow

## 2020-02-10 NOTE — Telephone Encounter (Signed)
Due for follow up appointment- can be discussed at that time.

## 2020-02-10 NOTE — Telephone Encounter (Signed)
Pt has a f/u appt scheduled 3/16 is this date ok or does she need something sooner?

## 2020-02-10 NOTE — Telephone Encounter (Signed)
PA approved.

## 2020-02-10 NOTE — Telephone Encounter (Signed)
I got her Viibryd approved with insurance with a Prior [employer]

## 2020-02-12 ENCOUNTER — Encounter: Payer: Self-pay | Admitting: Nurse Practitioner

## 2020-02-12 ENCOUNTER — Ambulatory Visit (INDEPENDENT_AMBULATORY_CARE_PROVIDER_SITE_OTHER): Payer: Medicare Other | Admitting: Nurse Practitioner

## 2020-02-12 ENCOUNTER — Other Ambulatory Visit: Payer: Self-pay

## 2020-02-12 DIAGNOSIS — F339 Major depressive disorder, recurrent, unspecified: Secondary | ICD-10-CM

## 2020-02-12 DIAGNOSIS — F419 Anxiety disorder, unspecified: Secondary | ICD-10-CM | POA: Diagnosis not present

## 2020-02-12 MED ORDER — LORAZEPAM 1 MG PO TABS: 0.5000 mg | ORAL_TABLET | Freq: Every day | ORAL | 0 refills | Status: DC | PRN

## 2020-02-12 MED ORDER — TRAZODONE HCL 50 MG PO TABS: 50.0000 mg | ORAL_TABLET | Freq: Two times a day (BID) | ORAL | 0 refills | Status: DC

## 2020-02-12 NOTE — Progress Notes (Signed)
There were no vitals taken for this visit.   Subjective:    Patient ID: Amanda Wang, female    DOB: October 19, 1951, 69 y.o.   MRN: [id number]  HPI: Amanda Wang is a 69 y.o. female  Chief Complaint  Patient presents with  . Depression    Patient's insurance isn't going to pay for Viibryd anymore.   DEPRESSION - Patient reports taking Viibryd for more than 5 years; states it has worked very well with little side effects. Mood status: exacerbated Satisfied with current treatment?: yes Symptom severity: none  Duration of current treatment : chronic; years Side effects: no Medication compliance: excellent compliance Psychotherapy/counseling: yes current Previous psychiatric medications: multiple Depressed mood: no Anxious mood: yes  Significant weight loss or gain: yes Insomnia: yes hard to fall asleep Fatigue: yes Feelings of worthlessness or guilt: no Impaired concentration/indecisiveness: yes; chronic per patient but managable Suicidal ideations: no  Homicidal ideations: no Hopelessness: no Crying spells: no Depression screen [employer] 2/9 02/12/2020 09/22/2019 09/16/2018 05/27/2018 11/23/2017  Decreased Interest 0 0 0 0 0  Down, Depressed, Hopeless 1 0 0 1 0  PHQ - 2 Score 1 0 0 1 0  Altered sleeping 3 - - 1 -  Tired, decreased energy 2 - - 0 -  Change in appetite 1 - - 0 -  Feeling bad or failure about yourself  1 - - 1 -  Trouble concentrating 3 - - 0 -  Moving slowly or fidgety/restless 3 - - 0 -  Suicidal thoughts 0 - - 0 -  PHQ-9 Score 14 - - 3 -  Difficult doing work/chores Not difficult at all - - - -    GAD 7 : Generalized Anxiety Score 02/12/2020  Nervous, Anxious, on Edge 0  Control/stop worrying 2  Worry too much - different things 3  Trouble relaxing 1  Restless 2  Easily annoyed or irritable 1  Afraid - awful might happen 1  Total GAD 7 Score 10  Anxiety Difficulty Not difficult at all   Patient reports that she has been doing virtual learning with  her 3 grandchildren for the past year.  She thinks a lot of her worrying and stress has to do with this increased stress caring for and teaching her grandkids, and worrying about the pandemic. She states she sees a Social worker for this issue.   She states her grandchildren go back to school Monday and Tuesday next week and then Wednesday and Thursday will be virtual days.  She is thankful that she is healthy and is able to "keep" them during this time.  She thinks her depression and anxiety "numbers" are high strictly because she is "on the go" all of the time. She states she is not suffering from depression but it does get to her some days.  She would like to continue the Viibryd if covered by insurance.  Also wishes to continue use of benzodiazepine sparingly, patient aware of risks of psychoactive medication use to include increased sedation, respiratory suppression, falls, extrapyramidal movements,  dependence and cardiovascular events.  Patient would like to continue treatment as benefit determined to outweigh risk.     She states she is not taking her benazepril or HCTZ for her hypertension because it gives her cramps.  She would like to schedule a follow up appointment in-person for her hypertension and hyperlipidemia.  She does not have a blood pressure cuff at home.  Allergies  Allergen Reactions  . Fexofenadine Anxiety  Makes her have a panic attack  . Codeine   . Methylphenidate Hcl     Other reaction(s): Other (See Comments) "Caused a seizure"  . Zoloft [Sertraline]   . Magnevist [Gadopentetate] Other (See Comments)    IV Contrast: Other Reaction: hives/itching  . Sulfa Antibiotics Rash    Other reaction(s): Other (See Comments)   Outpatient Encounter Medications as of 02/12/2020  Medication Sig  . amLODipine (NORVASC) 5 MG tablet Take 1 tablet (5 mg total) by mouth daily.  Marland Kitchen atorvastatin (LIPITOR) 40 MG tablet Take 1 tablet (40 mg total) by mouth daily at 6 PM.  . benazepril  (LOTENSIN) 20 MG tablet Take 1 tablet (20 mg total) by mouth daily.  . cyclobenzaprine (FLEXERIL) 10 MG tablet take 1 tablet by mouth three times a day if needed for muscle spasm  . gabapentin (NEURONTIN) 300 MG capsule Take 1 capsule (300 mg total) by mouth 3 (three) times daily.  . hydrochlorothiazide (HYDRODIURIL) 25 MG tablet Take 1 tablet (25 mg total) by mouth daily.  Marland Kitchen levETIRAcetam (KEPPRA) 500 MG tablet Take 1 tablet (500 mg total) by mouth daily.  Marland Kitchen LORazepam (ATIVAN) 1 MG tablet Take 0.5-1 tablets (0.5-1 mg total) by mouth daily as needed for anxiety.  . montelukast (SINGULAIR) 10 MG tablet Take 1 tablet (10 mg total) by mouth at bedtime.  . rifaximin (XIFAXAN) 550 MG TABS tablet Take 1 tablet by mouth daily as needed.  . [DISCONTINUED] LORazepam (ATIVAN) 1 MG tablet Take 0.5-1 tablets (0.5-1 mg total) by mouth daily as needed for anxiety.  . traZODone (DESYREL) 50 MG tablet Take 1 tablet (50 mg total) by mouth 2 (two) times daily.  . Vilazodone HCl (VIIBRYD) 20 MG TABS Take 1 tablet (20 mg total) by mouth daily. (Patient not taking: Reported on 02/12/2020)   No facility-administered encounter medications on file as of 02/12/2020.   Patient Active Problem List   Diagnosis Date Noted  . Fatigue 08/06/2019  . Irritable bowel syndrome with diarrhea   . Benign neoplasm of transverse colon   . Diarrhea 10/07/2018  . GERD (gastroesophageal reflux disease) 09/16/2018  . Advanced care planning/counseling discussion 09/16/2018  . Personal history of surgery to heart and great vessels, presenting hazards to health 09/16/2018  . Personal history of digestive disease 09/16/2018  . Hiatal hernia 07/29/2018  . Restless legs 03/07/2018  . Hypertension 10/26/2017  . Other hyperlipidemia 10/26/2017  . Chiari malformation type I ([place]) 08/07/2017  . Anxiety 08/07/2017  . Status post bariatric surgery 02/01/2017  . Depression, recurrent ([place])    Past Medical History:  Diagnosis Date  .  Arthritis   . Chiari malformation type I ([place])   . Depression   . GERD (gastroesophageal reflux disease)   . History of hiatal hernia   . Hyperlipidemia   . Hypertension   . Sleep apnea   . Small intestinal bacterial overgrowth   . Spinal stenosis    Relevant past medical, surgical, family and social history reviewed and updated as indicated. Interim medical history since our last visit reviewed.  Review of Systems  Constitutional: Positive for fatigue. Negative for activity change, appetite change, fever and unexpected weight change.  Eyes: Negative.   Respiratory: Negative.   Cardiovascular: Negative.   Genitourinary: Negative.   Musculoskeletal: Negative.   Skin: Negative.   Neurological: Negative.  Negative for dizziness, weakness, light-headedness and headaches.  Psychiatric/Behavioral: Positive for decreased concentration and sleep disturbance. Negative for agitation, behavioral problems, confusion, self-injury and suicidal ideas. The  patient is nervous/anxious. The patient is not hyperactive.    Per HPI unless specifically indicated above    Objective:    There were no vitals taken for this visit.  Wt Readings from Last 3 Encounters:  09/22/19 158 lb (71.7 kg)  06/26/19 152 lb (68.9 kg)  03/17/19 143 lb (64.9 kg)    Physical Exam Nursing note reviewed.  Constitutional:      General: She is not in acute distress.    Appearance: She is normal weight. She is not toxic-appearing.  HENT:     Head: Normocephalic.  Eyes:     Extraocular Movements: Extraocular movements intact.  Pulmonary:     Effort: Pulmonary effort is normal. No respiratory distress.  Musculoskeletal:        General: Normal range of motion.     Cervical back: Normal range of motion. No rigidity.  Skin:    Coloration: Skin is not jaundiced or pale.  Neurological:     General: No focal deficit present.     Mental Status: She is alert and oriented to person, place, and time. Mental status is at  baseline.     Gait: Gait normal.  Psychiatric:        Attention and Perception: Attention normal.        Mood and Affect: Mood is anxious. Mood is not depressed. Affect is not flat.        Speech: Speech normal.        Behavior: Behavior normal. Behavior is not agitated. Behavior is cooperative.        Thought Content: Thought content normal. Thought content is not paranoid or delusional. Thought content does not include homicidal or suicidal ideation. Thought content does not include homicidal or suicidal plan.        Judgment: Judgment normal.       Assessment & Plan:   Problem List Items Addressed This Visit      Other   Depression, recurrent (HCC)    Chronic, uncontrolled.  PHQ-9 elevated today.  Uses benzo sparingly, PDMP reviewed.  Benzo refilled with education on side effects and risks.  Reports good control on Viibryd, and wishes to continue, however due to high deductible will transition to Trazadone 50mg  bid.  Side effects discussed.  Advised to cut Viibryd in half and take 10mg  for 7 days along with trazadone at regular dose and then stop Viibryd completely to wean off.  Educated to call clinic with concerns in meantime.  Will schedule f/u for mood and BP/HLD in 4 weeks.      Relevant Medications   LORazepam (ATIVAN) 1 MG tablet   traZODone (DESYREL) 50 MG tablet   Anxiety    Chronic, uncontrolled.  GAD-7 elevated today.  Uses benzo sparingly, PDMP reviewed.  Benzo refilled with education on side effects and risks.  Reports good control on Viibryd, and wishes to continue, however due to high deductible will transition to Trazadone 50mg  bid.  Side effects discussed.  Advised to cut Viibryd in half and take 10mg  for 7 days along with trazadone at regular dose and then stop Viibryd completely to wean off.  Educated to call clinic with concerns in meantime.  Will schedule f/u for mood and BP/HLD in 4 weeks.      Relevant Medications   LORazepam (ATIVAN) 1 MG tablet   traZODone  (DESYREL) 50 MG tablet       Follow up plan: Return in about 4 weeks (around 03/11/2020) for f/u on HTN HLD.  Due to the catastrophic nature of the COVID-19 pandemic, this visit was completed via audio and visual contact via Mychart due to the restrictions of the COVID-19 pandemic. All issues as above were discussed and addressed. Physical exam was done as above through visual confirmation on Mychart. If it was felt that the patient should be evaluated in the office, they were directed there. The patient verbally consented to this visit."} . Location of the patient: home . Location of the provider: work . Those involved with this call:  . Provider: Carnella Gu[place], DNP . CMA: Merilyn Baba, CMA . Front Desk/Registration: Jill Side  . Time spent on call: 21 minutes on the phone discussing health concerns. 30 minutes total spent in review of patient's record and preparation of their chart.  I verified patient identity using two factors (patient name and date of birth). Patient consents verbally to being seen via telemedicine visit today.  Greater than 25 minutes spent in counseling and coordination of care.

## 2020-02-12 NOTE — Progress Notes (Signed)
Unable to lvm due to full mail box. Sent letter.

## 2020-02-12 NOTE — Patient Instructions (Addendum)
It was nice talking to you today, see you soon for your follow up! Take care.  Managing Stress, Adult Feeling a certain amount of stress is normal. Stress helps our body and mind get ready to deal with the demands of life. Stress hormones can motivate you to do well at work and meet your responsibilities. However severe or long-lasting (chronic) stress can affect your mental and physical health. Chronic stress puts you at higher risk for anxiety, depression, and other health problems like digestive problems, muscle aches, heart disease, high blood pressure, and stroke. What are the causes? Common causes of stress include:  Demands from work, such as deadlines, feeling overworked, or having long hours.  Pressures at home, such as money issues, disagreements with a spouse, or parenting issues.  Pressures from major life changes, such as divorce, moving, loss of a loved one, or chronic illness. You may be at higher risk for stress-related problems if you do not get enough sleep, are in poor health, do not have emotional support, or have a mental health disorder like anxiety or depression. How to recognize stress Stress can make you:  Have trouble sleeping.  Feel sad, anxious, irritable, or overwhelmed.  Lose your appetite.  Overeat or want to eat unhealthy foods.  Want to use drugs or alcohol. Stress can also cause physical symptoms, such as:  Sore, tense muscles, especially in the shoulders and neck.  Headaches.  Trouble breathing.  A faster heart rate.  Stomach pain, nausea, or vomiting.  Diarrhea or constipation.  Trouble concentrating. Follow these instructions at home: Lifestyle  Identify the source of your stress and your reaction to it. See a therapist who can help you change your reactions.  When there are stressful events: ? Talk about it with family, friends, or co-workers. ? Try to think realistically about stressful events and not ignore them or  overreact. ? Try to find the positives in a stressful situation and not focus on the negatives. ? Cut back on responsibilities at work and home, if possible. Ask for help from friends or family members if you need it.  Find ways to cope with stress, such as: ? Meditation. ? Deep breathing. ? Yoga or tai chi. ? Progressive muscle relaxation. ? Doing art, playing music, or reading. ? Making time for fun activities. ? Spending time with family and friends.  Get support from family, friends, or spiritual resources. Eating and drinking  Eat a healthy diet. This includes: ? Eating foods that are high in fiber, such as beans, whole grains, and fresh fruits and vegetables. ? Limiting foods that are high in fat and processed sugars, such as fried and sweet foods.  Do not skip meals or overeat.  Drink enough fluid to keep your urine pale yellow. Alcohol use  Do not drink alcohol if: ? Your health care provider tells you not to drink. ? You are pregnant, may be pregnant, or are planning to become pregnant.  Drinking alcohol is a way some people try to ease their stress. This can be dangerous, so if you drink alcohol: ? Limit how much you use to:  0-1 drink a day for women.  0-2 drinks a day for men. ? Be aware of how much alcohol is in your drink. In the [place] one drink equals one 12 oz bottle of beer (355 mL), one 5 oz glass of wine (148 mL), or one 1 oz glass of hard liquor (44 mL). Activity   Include 30 minutes of  exercise in your daily schedule. Exercise is a good stress reducer.  Include time in your day for an activity that you find relaxing. Try taking a walk, going on a bike ride, reading a book, or listening to music.  Schedule your time in a way that lowers stress, and keep a consistent schedule. Prioritize what is most important to get done. General instructions  Get enough sleep. Try to go to sleep and get up at about the same time every day.  Take over-the-counter  and prescription medicines only as told by your health care provider.  Do not use any products that contain nicotine or tobacco, such as cigarettes, e-cigarettes, and chewing tobacco. If you need help quitting, ask your health care provider.  Do not use drugs or smoke to cope with stress.  Keep all follow-up visits as told by your health care provider. This is important. Where to find support  Talk with your health care provider about stress management or finding a support group.  Find a therapist to work with you on your stress management techniques. Contact a health care provider if:  Your stress symptoms get worse.  You are unable to manage your stress at home.  You are struggling to stop using drugs or alcohol. Get help right away if:  You may be a danger to yourself or others.  You have any thoughts of death or suicide. If you ever feel like you may hurt yourself or others, or have thoughts about taking your own life, get help right away. You can go to your nearest emergency department or call:  Your local emergency services (911 in the [place]  A suicide crisis helpline, such as the [facility] at 778-496-0080. This is open 24 hours a day. Summary  Feeling a certain amount of stress is normal, but severe or long-lasting (chronic) stress can affect your mental and physical health.  Chronic stress can put you at higher risk for anxiety, depression, and other health problems like digestive problems, muscle aches, heart disease, high blood pressure, and stroke.  You may be at higher risk for stress-related problems if you do not get enough sleep, are in poor health, lack emotional support, or have a mental health disorder like anxiety or depression.  Identify the source of your stress and your reaction to it. Try talking about stressful events with family, friends, or co-workers, finding a coping method, or getting support from spiritual  resources.  If you need more help, talk with your health care provider about finding a support group or a mental health therapist. This information is not intended to replace advice given to you by your health care provider. Make sure you discuss any questions you have with your health care provider. Document Revised: 07/02/2019 Document Reviewed: 07/02/2019 Elsevier Patient Education  [place].

## 2020-02-13 ENCOUNTER — Ambulatory Visit: Payer: Medicare Other | Attending: Internal Medicine

## 2020-02-13 DIAGNOSIS — Z23 Encounter for immunization: Secondary | ICD-10-CM | POA: Insufficient documentation

## 2020-02-13 NOTE — Assessment & Plan Note (Signed)
Chronic, uncontrolled.  GAD-7 elevated today.  Uses benzo sparingly, PDMP reviewed.  Benzo refilled with education on side effects and risks.  Reports good control on Viibryd, and wishes to continue, however due to high deductible will transition to Trazadone 50mg  bid.  Side effects discussed.  Advised to cut Viibryd in half and take 10mg  for 7 days along with trazadone at regular dose and then stop Viibryd completely to wean off.  Educated to call clinic with concerns in meantime.  Will schedule f/u for mood and BP/HLD in 4 weeks.

## 2020-02-13 NOTE — Assessment & Plan Note (Addendum)
Chronic, uncontrolled.  PHQ-9 elevated today.  Uses benzo sparingly, PDMP reviewed.  Benzo refilled with education on side effects and risks.  Reports good control on Viibryd, and wishes to continue, however due to high deductible will transition to Trazadone 50mg  bid.  Side effects discussed.  Advised to cut Viibryd in half and take 10mg  for 7 days along with trazadone at regular dose and then stop Viibryd completely to wean off.  Educated to call clinic with concerns in meantime.  Will schedule f/u for mood and BP/HLD in 4 weeks.

## 2020-02-13 NOTE — Progress Notes (Signed)
   Covid-19 Vaccination Clinic  Name:  Amanda Wang    MRN: [id number] DOB: [birth year]  02/13/2020  [name] was observed post Covid-19 immunization for 15 minutes without incidence. She was provided with Vaccine Information Sheet and instruction to access the V-Safe system.   [name] was instructed to call 911 with any severe reactions post vaccine: Marland Kitchen Difficulty breathing  . Swelling of your face and throat  . A fast heartbeat  . A bad rash all over your body  . Dizziness and weakness    Immunizations Administered    Name Date Dose VIS Date Route   Pfizer COVID-19 Vaccine 02/13/2020  8:22 AM 0.3 mL 11/28/2019 Intramuscular   Manufacturer: [place]   Lot: [id number]   [place]: [id number]

## 2020-02-17 DIAGNOSIS — F411 Generalized anxiety disorder: Secondary | ICD-10-CM | POA: Diagnosis not present

## 2020-02-23 DIAGNOSIS — F411 Generalized anxiety disorder: Secondary | ICD-10-CM | POA: Diagnosis not present

## 2020-02-26 ENCOUNTER — Telehealth: Payer: Medicare Other | Admitting: Nurse Practitioner

## 2020-03-01 DIAGNOSIS — F411 Generalized anxiety disorder: Secondary | ICD-10-CM | POA: Diagnosis not present

## 2020-03-02 ENCOUNTER — Telehealth: Payer: Self-pay | Admitting: Nurse Practitioner

## 2020-03-02 ENCOUNTER — Telehealth: Payer: Medicare Other | Admitting: Nurse Practitioner

## 2020-03-02 NOTE — Telephone Encounter (Signed)
Copied from [place] [id number]. Topic: General - Other >> Mar 02, 2020 11:34 AM Keene Breath wrote: Reason for CRM: Patient would like the nurse to call her regarding her medication.  Patient stated the insurance company is not covering it.  Patient is having side effects with traZODone (DESYREL) 50 MG tablet.  She stated that it puts her to sleep.  CB# [phone]

## 2020-03-02 NOTE — Telephone Encounter (Signed)
Patient states that the Trazodone is too strong and she has a really hard time waking up.  She states that she has panic attacks, currently on Vibryd, she will discuss this at up coming appt.

## 2020-03-02 NOTE — Telephone Encounter (Signed)
Noted, thank you.  Will discuss at upcoming appointment.

## 2020-03-08 DIAGNOSIS — F411 Generalized anxiety disorder: Secondary | ICD-10-CM | POA: Diagnosis not present

## 2020-03-09 ENCOUNTER — Ambulatory Visit: Payer: Medicare Other | Attending: Internal Medicine

## 2020-03-09 DIAGNOSIS — Z23 Encounter for immunization: Secondary | ICD-10-CM

## 2020-03-09 NOTE — Progress Notes (Signed)
   Covid-19 Vaccination Clinic  Name:  Amanda Wang    MRN: [id number] DOB: [birth year]  03/09/2020  Amanda Wang was observed post Covid-19 immunization for 15 minutes without incident. She was provided with Vaccine Information Sheet and instruction to access the V-Safe system.   Amanda Wang was instructed to call 911 with any severe reactions post vaccine: Marland Kitchen Difficulty breathing  . Swelling of face and throat  . A fast heartbeat  . A bad rash all over body  . Dizziness and weakness   Immunizations Administered    Name Date Dose VIS Date Route   Pfizer COVID-19 Vaccine 03/09/2020  1:06 PM 0.3 mL 11/28/2019 Intramuscular   Manufacturer: [place]   Lot: [id number]   [place]: [id number]

## 2020-03-09 NOTE — Telephone Encounter (Signed)
At her last visit, we discussed that Trazodone (the new medication I started her on) would take the place of Viibryd.  My instructions to her were to wean off of the Viibryd while taking the Trazodone.  If the Trazodone makes her too sleepy, she can cut in half.  Will be sure to address with her Friday.

## 2020-03-09 NOTE — Telephone Encounter (Signed)
Pt calling back. Pt states that she is out of Vibryd. Pt would like a call back from the nurse as soon as possible. Please advise

## 2020-03-09 NOTE — Telephone Encounter (Signed)
Called and spoke to patient. She states that her insurance will no longer cover the Viibryd. Advised patient that this may need to be discussed at her appointment on Friday before any other medication is prescribed. Let her know that I would call her back if Janett Billow does send something in but will not call back if it will need to be discussed on Friday. Patient verbalized understanding.

## 2020-03-11 ENCOUNTER — Encounter: Payer: Self-pay | Admitting: Nurse Practitioner

## 2020-03-11 ENCOUNTER — Ambulatory Visit (INDEPENDENT_AMBULATORY_CARE_PROVIDER_SITE_OTHER): Payer: Medicare Other | Admitting: Nurse Practitioner

## 2020-03-11 ENCOUNTER — Ambulatory Visit: Payer: Self-pay | Admitting: *Deleted

## 2020-03-11 ENCOUNTER — Other Ambulatory Visit: Payer: Self-pay

## 2020-03-11 DIAGNOSIS — F339 Major depressive disorder, recurrent, unspecified: Secondary | ICD-10-CM

## 2020-03-11 DIAGNOSIS — T50Z95A Adverse effect of other vaccines and biological substances, initial encounter: Secondary | ICD-10-CM | POA: Diagnosis not present

## 2020-03-11 HISTORY — DX: Adverse effect of other vaccines and biological substances, initial encounter: T50.Z95A

## 2020-03-11 MED ORDER — VORTIOXETINE HBR 5 MG PO TABS: 5.0000 mg | ORAL_TABLET | Freq: Every day | ORAL | 0 refills | Status: DC

## 2020-03-11 MED ORDER — PREDNISONE 20 MG PO TABS: 40.0000 mg | ORAL_TABLET | Freq: Every day | ORAL | 0 refills | Status: DC

## 2020-03-11 NOTE — Telephone Encounter (Signed)
Pt called with complaints of itching since her 2nd COVID vaccine on 02/10/20; she says her symptoms started 20 minutes after getting her vaccine; she started taking 03/09/20 and took 2 pills; on 03/10/20 benadryl  night, and had no relief; she had a dose of 2 benadryl, and the 2 doses of 1 benadryl over a 4 hour period; she also experiencing itching from her scalp to feet; she denies a rash or redness; she has also tried showering, and using oil but it has not helped; the pt says she has redness to the injection site in her left arm; she says the area is warm to the touch; recommendations made per nurse triage protocol; she verbalized understanding, but would like to discuss this with her PCP because she has her grand chilldren today; pt offered and accepted virtual appt with Carnella [place], [facility], 03/11/20 at 0915; she verbalized understanding; will route to office for notification.  Reason for Disposition . Sounds like a severe, unusual reaction to the triager  Answer Assessment - Initial Assessment Questions 1. MAIN CONCERN OR SYMPTOM:  "What is your main concern right now?" "What question do you have?" "What's the main symptom you're worried about?" (e.g., fever, pain, redness, swelling)     Itching all over 2. VACCINE: "What vaccination did you receive?" "Is this your first or second shot?" (e.g., none; Moderna, Coca-Cola, other)     2nd dose of Coca-Cola 3. SYMPTOM ONSET: "When did the  begin?" (e.g., not relevant; hours, days)      Started 20 min after vaccine on 03/09/20 4. SYMPTOM SEVERITY: "How bad is it?"    severe 5. FEVER: "Is there a fever?" If so, ask: "What is it, how was it measured, and when did it start?"     no 6. PAST REACTIONS: "Have you reacted to immunizations before?" If so, ask: "What happened?"   no 7. OTHER SYMPTOMS: "Do you have any other symptoms?"    Lump, warmth, and redness on left arm at injection site  Protocols used: CORONAVIRUS (COVID-19) VACCINE QUESTIONS AND  REACTIONS-A-AH

## 2020-03-11 NOTE — Assessment & Plan Note (Signed)
Chronic, ongoing.  Will stop trazodone and start Trintellix, patient reports this is covered by her insurance.  Patient to call or return to clinic with concerns.  Will f/u in 3 weeks on mood.

## 2020-03-11 NOTE — Patient Instructions (Signed)
Vortioxetine oral tablet What is this medicine? Vortioxetine (vor tee OX e teen) is used to treat depression. This medicine may be used for other purposes; ask your health care provider or pharmacist if you have questions. COMMON BRAND NAME(S): BRINTELLIX, Trintellix What should I tell my health care provider before I take this medicine? They need to know if you have any of these conditions:  bipolar disorder or a family history of bipolar disorder  bleeding disorders  drink alcohol  glaucoma  liver disease  low levels of sodium in the blood  seizures  suicidal thoughts, plans, or attempt; a previous suicide attempt by you or a family member  take medicines that treat or prevent blood clots  an unusual or allergic reaction to vortioxetine, other medicines, foods, dyes, or preservatives  pregnant or trying to get pregnant  breast-feeding How should I use this medicine? Take this medicine by mouth with a glass of water. Follow the directions on the prescription label. You can take it with or without food. If it upsets your stomach, take it with food. Take your medicine at regular intervals. Do not take it more often than directed. Do not stop taking this medicine suddenly except upon the advice of your doctor. Stopping this medicine too quickly may cause serious side effects or your condition may worsen. A special MedGuide will be given to you by the pharmacist with each prescription and refill. Be sure to read this information carefully each time. Talk to your pediatrician regarding the use of this medicine in children. Special care may be needed. Overdosage: If you think you have taken too much of this medicine contact a poison control center or emergency room at once. NOTE: This medicine is only for you. Do not share this medicine with others. What if I miss a dose? If you miss a dose, take it as soon as you can. If it is almost time for your next dose, take only that dose. Do  not take double or extra doses. What may interact with this medicine? Do not take this medicine with any of the following medications:  linezolid  MAOIs like Carbex, Eldepryl, Marplan, Nardil, and Parnate  methylene blue (injected into a vein) This medicine may also interact with the following medications:  alcohol  aspirin and aspirin-like medicines  carbamazepine  certain medicines for depression, anxiety, or psychotic disturbances  certain medicines for migraine headache like almotriptan, eletriptan, frovatriptan, naratriptan, rizatriptan, sumatriptan, zolmitriptan  diuretics  fentanyl  furazolidone  isoniazid  medicines that treat or prevent blood clots like warfarin, enoxaparin, and dalteparin  NSAIDs, medicines for pain and inflammation, like ibuprofen or naproxen  phenytoin  procarbazine  quinidine  rasagiline  rifampin  supplements like St. John's wort, kava kava, valerian  tramadol  tryptophan This list may not describe all possible interactions. Give your health care provider a list of all the medicines, herbs, non-prescription drugs, or dietary supplements you use. Also tell them if you smoke, drink alcohol, or use illegal drugs. Some items may interact with your medicine. What should I watch for while using this medicine? Tell your doctor if your symptoms do not get better or if they get worse. Visit your doctor or health care professional for regular checks on your progress. Because it may take several weeks to see the full effects of this medicine, it is important to continue your treatment as prescribed by your doctor. Patients and their families should watch out for new or worsening thoughts of suicide or   depression. Also watch out for sudden changes in feelings such as feeling anxious, agitated, panicky, irritable, hostile, aggressive, impulsive, severely restless, overly excited and hyperactive, or not being able to sleep. If this happens,  especially at the beginning of treatment or after a change in dose, call your health care professional. You may get drowsy or dizzy. Do not drive, use machinery, or do anything that needs mental alertness until you know how this medicine affects you. Do not stand or sit up quickly, especially if you are an older patient. This reduces the risk of dizzy or fainting spells. Alcohol may interfere with the effect of this medicine. Avoid alcoholic drinks. Your mouth may get dry. Chewing sugarless gum or sucking hard candy, and drinking plenty of water may help. Contact your doctor if the problem does not go away or is severe. What side effects may I notice from receiving this medicine? Side effects that you should report to your doctor or health care professional as soon as possible:  allergic reactions like skin rash, itching or hives, swelling of the face, lips, or tongue  anxious  black, tarry stools  changes in vision  confusion  elevated mood, decreased need for sleep, racing thoughts, impulsive behavior  eye pain  fast, irregular heartbeat  feeling faint or lightheaded, falls  feeling agitated, angry, or irritable  hallucination, loss of contact with reality  loss of balance or coordination  loss of memory  painful or prolonged erections  restlessness, pacing, inability to keep still  seizures  stiff muscles  suicidal thoughts or other mood changes  trouble sleeping  unusual bleeding or bruising  unusually weak or tired  vomiting Side effects that usually do not require medical attention (report to your doctor or health care professional if they continue or are bothersome):  change in appetite or weight  change in sex drive or performance  constipation  dizziness  dry mouth  nausea This list may not describe all possible side effects. Call your doctor for medical advice about side effects. You may report side effects to FDA at 1-800-FDA-1088. Where  should I keep my medicine? Keep out of the reach of children. Store at room temperature between 15 and 30 degrees C (59 and 86 degrees F). Throw away any unused medicine after the expiration date. NOTE: This sheet is a summary. It may not cover all possible information. If you have questions about this medicine, talk to your doctor, pharmacist, or health care provider.  2020 Elsevier/Gold Standard (2016-05-04 16:45:13)  

## 2020-03-11 NOTE — Assessment & Plan Note (Signed)
Acute, ongoing.  Somewhat improved with prn Benadryl.  Will start on corticosteroid burst x4 days.  Follow up by Monday if not better.  Advised with any shortness of breath, chest pain, trouble swallowing, facial edema, or tongue swelling, to go to ED.

## 2020-03-11 NOTE — Progress Notes (Signed)
There were no vitals taken for this visit.   Subjective:    Patient ID: Amanda Wang, female    DOB: 04-28-1951, 69 y.o.   MRN: [id number]  HPI: Amanda Wang is a 70 y.o. female presenting for vaccine reaction.  Chief Complaint  Patient presents with  . Allergic Reaction    Patient got COVID Vaccine (2nd Dose) Tuesday and patient states she's itching everywhere. Has knot at area on left arm and is sore. Benadryl isn't working.    VACCINE REACTION Patient reports she got her second COVID-19 vaccine on 03/09/2020 and started having itching about 20 minutes after getting the vaccine.  She said she started developing itching from her head to toes.  She took 2 Benadryl that night, she says it did help and she took something to help her sleep.  She started getting itchy again by supper time.  She took 2 more Benadryl yesterday evening before bed.  She took a shower with cold water early this morning.    Duration: days Cough: no Sinus pressure: no  Fever: no  Tongue Swelling: no Difficultly Swallowing: no Facial edema: no Chest pain: no Shortness of breath: no Wheezing: no Rash: no Hives: no She reports there is also a knot where the vaccine went in. Treatments attempted: singulair   ANXIETY/STRESS Was well-controlled on Viibryd for years however Viibryd is no longer covered by her insurance; she was started on Trazodone at the last appointment.  She reports the Trazodone made her feel like a zombie, similar to when she was taking lexapro or zoloft. Duration:uncontrolled Anxious mood: yes  Excessive worrying: yes Irritability: no  Sweating: no Nausea: no Palpitations:no Hyperventilation: no Panic attacks: yes Agoraphobia: no  Obscessions/compulsions: no Depressed mood: no    Office Visit from 03/11/2020 in [facility]  PHQ-2 Total Score  1     Anhedonia: no Weight changes: no Insomnia: yes ; hard to fall asleep  Hypersomnia: no Fatigue/loss of  energy: yes Feelings of worthlessness: no Feelings of guilt: no Impaired concentration/indecisiveness: yes Suicidal ideations: no  Crying spells: no Recent Stressors/Life Changes: yes   Relationship problems: no   Family stress: yes     Financial stress: no    Job stress: no    Recent death/loss: no  Allergies  Allergen Reactions  . Fexofenadine Anxiety    Makes her have a panic attack  . Codeine   . Methylphenidate Hcl     Other reaction(s): Other (See Comments) "Caused a seizure"  . Zoloft [Sertraline]   . Magnevist [Gadopentetate] Other (See Comments)    IV Contrast: Other Reaction: hives/itching  . Sulfa Antibiotics Rash    Other reaction(s): Other (See Comments)   Outpatient Encounter Medications as of 03/11/2020  Medication Sig  . amLODipine (NORVASC) 5 MG tablet Take 1 tablet (5 mg total) by mouth daily.  Marland Kitchen atorvastatin (LIPITOR) 40 MG tablet Take 1 tablet (40 mg total) by mouth daily at 6 PM.  . benazepril (LOTENSIN) 20 MG tablet Take 1 tablet (20 mg total) by mouth daily.  . cyclobenzaprine (FLEXERIL) 10 MG tablet take 1 tablet by mouth three times a day if needed for muscle spasm  . gabapentin (NEURONTIN) 300 MG capsule Take 1 capsule (300 mg total) by mouth 3 (three) times daily.  . hydrochlorothiazide (HYDRODIURIL) 25 MG tablet Take 1 tablet (25 mg total) by mouth daily.  Marland Kitchen levETIRAcetam (KEPPRA) 500 MG tablet Take 1 tablet (500 mg total) by mouth daily.  Marland Kitchen  LORazepam (ATIVAN) 1 MG tablet Take 0.5-1 tablets (0.5-1 mg total) by mouth daily as needed for anxiety.  . montelukast (SINGULAIR) 10 MG tablet Take 1 tablet (10 mg total) by mouth at bedtime.  . rifaximin (XIFAXAN) 550 MG TABS tablet Take 1 tablet by mouth daily as needed.  . [DISCONTINUED] traZODone (DESYREL) 50 MG tablet Take 1 tablet (50 mg total) by mouth 2 (two) times daily.  . predniSONE (DELTASONE) 20 MG tablet Take 2 tablets (40 mg total) by mouth daily with breakfast.  . vortioxetine HBr (TRINTELLIX)  5 MG TABS tablet Take 1 tablet (5 mg total) by mouth daily.  . [DISCONTINUED] Vilazodone HCl (VIIBRYD) 20 MG TABS Take 1 tablet (20 mg total) by mouth daily. (Patient not taking: Reported on 02/12/2020)   No facility-administered encounter medications on file as of 03/11/2020.   Patient Active Problem List   Diagnosis Date Noted  . Vaccine reaction, initial encounter 03/11/2020  . Fatigue 08/06/2019  . Irritable bowel syndrome with diarrhea   . Benign neoplasm of transverse colon   . Diarrhea 10/07/2018  . GERD (gastroesophageal reflux disease) 09/16/2018  . Advanced care planning/counseling discussion 09/16/2018  . Personal history of surgery to heart and great vessels, presenting hazards to health 09/16/2018  . Personal history of digestive disease 09/16/2018  . Hiatal hernia 07/29/2018  . Restless legs 03/07/2018  . Hypertension 10/26/2017  . Other hyperlipidemia 10/26/2017  . Chiari malformation type I ([place]) 08/07/2017  . Anxiety 08/07/2017  . Status post bariatric surgery 02/01/2017  . Depression, recurrent ([place])    Past Medical History:  Diagnosis Date  . Arthritis   . Chiari malformation type I ([place])   . Depression   . GERD (gastroesophageal reflux disease)   . History of hiatal hernia   . Hyperlipidemia   . Hypertension   . Sleep apnea   . Small intestinal bacterial overgrowth   . Spinal stenosis    Relevant past medical, surgical, family and social history reviewed and updated as indicated. Interim medical history since our last visit reviewed.  Review of Systems  Constitutional: Positive for fatigue. Negative for activity change, appetite change and fever.  HENT: Negative.  Negative for facial swelling, postnasal drip, rhinorrhea, sinus pain and trouble swallowing.   Respiratory: Negative.  Negative for cough, shortness of breath and wheezing.   Cardiovascular: Negative.  Negative for chest pain.  Musculoskeletal: Negative.   Skin: Negative for rash.       +  itching  Neurological: Negative.  Negative for dizziness, numbness and headaches.  Psychiatric/Behavioral: Positive for decreased concentration and sleep disturbance. Negative for agitation, behavioral problems, confusion and suicidal ideas. The patient is nervous/anxious.    Per HPI unless specifically indicated above     Objective:    There were no vitals taken for this visit.  Wt Readings from Last 3 Encounters:  09/22/19 158 lb (71.7 kg)  06/26/19 152 lb (68.9 kg)  03/17/19 143 lb (64.9 kg)    Physical Exam Nursing note reviewed.  Constitutional:      General: She is not in acute distress.    Appearance: Normal appearance. She is normal weight. She is not toxic-appearing.  HENT:     Head: Normocephalic and atraumatic.     Right Ear: External ear normal.     Left Ear: External ear normal.     Nose: Nose normal. No congestion.     Mouth/Throat:     Mouth: Mucous membranes are moist.     Pharynx:  Oropharynx is clear.  Eyes:     General: No scleral icterus.    Extraocular Movements: Extraocular movements intact.  Pulmonary:     Effort: Pulmonary effort is normal. No respiratory distress.  Musculoskeletal:     Cervical back: Normal range of motion. No rigidity.  Skin:    Coloration: Skin is not jaundiced or pale.     Findings: No bruising, erythema, lesion or rash.     Comments: Patient itching skin, hair, and face, during entire video visit.  Neurological:     General: No focal deficit present.     Mental Status: She is alert and oriented to person, place, and time.     Motor: No weakness.     Gait: Gait normal.  Psychiatric:        Mood and Affect: Mood normal.        Behavior: Behavior normal.        Thought Content: Thought content normal.        Judgment: Judgment normal.       Assessment & Plan:   Problem List Items Addressed This Visit      Other   Depression, recurrent ([place]) - Primary    Chronic, ongoing.  Will stop trazodone and start Trintellix,  patient reports this is covered by her insurance.  Patient to call or return to clinic with concerns.  Will f/u in 3 weeks on mood.      Relevant Medications   vortioxetine HBr (TRINTELLIX) 5 MG TABS tablet   Vaccine reaction, initial encounter    Acute, ongoing.  Somewhat improved with prn Benadryl.  Will start on corticosteroid burst x4 days.  Follow up by Monday if not better.  Advised with any shortness of breath, chest pain, trouble swallowing, facial edema, or tongue swelling, to go to ED.        Relevant Medications   predniSONE (DELTASONE) 20 MG tablet       Follow up plan: Return in about 3 weeks (around 04/01/2020) for mood follow up.   Due to the catastrophic nature of the COVID-19 pandemic, this visit was completed via audio and visual contact via Mychart due to the restrictions of the COVID-19 pandemic. All issues as above were discussed and addressed. Physical exam was done as above through visual confirmation on Mychart. If it was felt that the patient should be evaluated in the office, they were directed there. The patient verbally consented to this visit."} . Location of the patient: home . Location of the provider: work . Those involved with this call:  . Provider: Carnella Guadalajara, DNP . CMA: Merilyn Baba, CMA . Front Desk/Registration: [facility]  . Time spent on call: 22 minutes on the phone discussing health concerns. 20 minutes total spent in review of patient's record and preparation of their chart.  I verified patient identity using two factors (patient name and date of birth). Patient consents verbally to being seen via telemedicine visit today.

## 2020-03-12 ENCOUNTER — Telehealth: Payer: Medicare Other | Admitting: Nurse Practitioner

## 2020-03-15 ENCOUNTER — Encounter: Payer: Self-pay | Admitting: Nurse Practitioner

## 2020-03-15 ENCOUNTER — Telehealth: Payer: Self-pay

## 2020-03-15 ENCOUNTER — Telehealth: Payer: Self-pay | Admitting: Nurse Practitioner

## 2020-03-15 DIAGNOSIS — F411 Generalized anxiety disorder: Secondary | ICD-10-CM | POA: Diagnosis not present

## 2020-03-15 NOTE — Telephone Encounter (Signed)
Fax from pharmacy requesting a refill of TRAZADONE 50 MG TABLETS.    LOV: 03/11/2020

## 2020-03-15 NOTE — Telephone Encounter (Signed)
At last OV, we stopped Trazodone and started Trintellix.  Not going to refill Trazodone at this time.

## 2020-03-15 NOTE — Telephone Encounter (Signed)
Called pt to schedule 3 week f/u around 04/01/20 for mood, no answer, no vm set up, sending mychart letter.

## 2020-03-15 NOTE — Telephone Encounter (Signed)
-----   Message from Helayne Seminole, NP sent at 03/11/2020  9:53 AM EDT ----- Please reach out to patient to make f/u mood appointment for 3 weeks, okay to be virtual

## 2020-03-29 DIAGNOSIS — F411 Generalized anxiety disorder: Secondary | ICD-10-CM | POA: Diagnosis not present

## 2020-04-01 ENCOUNTER — Encounter: Payer: Self-pay | Admitting: Nurse Practitioner

## 2020-04-01 ENCOUNTER — Ambulatory Visit (INDEPENDENT_AMBULATORY_CARE_PROVIDER_SITE_OTHER): Payer: Medicare Other | Admitting: Nurse Practitioner

## 2020-04-01 ENCOUNTER — Other Ambulatory Visit: Payer: Self-pay

## 2020-04-01 DIAGNOSIS — F339 Major depressive disorder, recurrent, unspecified: Secondary | ICD-10-CM | POA: Diagnosis not present

## 2020-04-01 MED ORDER — VORTIOXETINE HBR 5 MG PO TABS: 5.0000 mg | ORAL_TABLET | Freq: Every day | ORAL | 1 refills | Status: DC

## 2020-04-01 NOTE — Progress Notes (Signed)
BP 118/77 (BP Location: Left Arm, Patient Position: Sitting, Cuff Size: Normal)   Pulse 77   Temp (!) 97.5 F (36.4 C) (Oral)   Wt 161 lb (73 kg)   SpO2 100%   BMI 29.45 kg/m    Subjective:    Patient ID: Amanda Wang, female    DOB: 1951-06-01, 69 y.o.   MRN: [id number]  HPI: Amanda Wang is a 69 y.o. female presenting for mood follow up.  Chief Complaint  Patient presents with  . Follow-up    Mood   ANXIETY/STRESS At the last visit, she started Trintellix 5 mg and stopped Trazodone.  She reports the Trintellix is wonderful, she is doing very well on the new medication and denies any side effects.  She does see a counselor weekly and has been continuing to do so.  She notices that the Trintellix has also been helping with her sleep. Mood status: uncontrolled Satisfied with current treatment?: yes  Symptom severity: moderate  Duration of current treatment : chronic Side effects: no Medication compliance: excellent compliance Psychotherapy/counseling: yes current Previous psychiatric medications: Trazodone, Viibryd Depressed mood: no Anxious mood: yes Anhedonia: no Significant weight loss or gain: no Insomnia: yes hard to fall asleep; much improved on Trintellix Fatigue: yes Feelings of worthlessness or guilt: yes Impaired concentration/indecisiveness: no Suicidal ideations: no Hopelessness: no Crying spells: no Depression screen [facility] 2/9 04/01/2020 03/11/2020 02/12/2020 09/22/2019 09/16/2018  Decreased Interest 0 0 0 0 0  Down, Depressed, Hopeless 1 1 1  0 0  PHQ - 2 Score 1 1 1  0 0  Altered sleeping 3 - 3 - -  Tired, decreased energy 2 - 2 - -  Change in appetite 1 - 1 - -  Feeling bad or failure about yourself  2 - 1 - -  Trouble concentrating 0 - 3 - -  Moving slowly or fidgety/restless 0 - 3 - -  Suicidal thoughts 0 - 0 - -  PHQ-9 Score 9 - 14 - -  Difficult doing work/chores Not difficult at all - Not difficult at all - -   GAD 7 : Generalized Anxiety  Score 04/01/2020 02/12/2020  Nervous, Anxious, on Edge 1 0  Control/stop worrying 1 2  Worry too much - different things 3 3  Trouble relaxing 1 1  Restless 1 2  Easily annoyed or irritable 1 1  Afraid - awful might happen 1 1  Total GAD 7 Score 9 10  Anxiety Difficulty Not difficult at all Not difficult at all   States that her mood is doing very well and the stress and anxiety she is feeling now cannot be fixed with any medication.  She is not desiring a dose adjustment today.  Allergies  Allergen Reactions  . Fexofenadine Anxiety    Makes her have a panic attack  . Codeine   . Methylphenidate Hcl     Other reaction(s): Other (See Comments) "Caused a seizure"  . Zoloft [Sertraline]   . Magnevist [Gadopentetate] Other (See Comments)    IV Contrast: Other Reaction: hives/itching  . Sulfa Antibiotics Rash    Other reaction(s): Other (See Comments)   Outpatient Encounter Medications as of 04/01/2020  Medication Sig  . amLODipine (NORVASC) 5 MG tablet Take 1 tablet (5 mg total) by mouth daily.  Marland Kitchen atorvastatin (LIPITOR) 40 MG tablet Take 1 tablet (40 mg total) by mouth daily at 6 PM.  . benazepril (LOTENSIN) 20 MG tablet Take 1 tablet (20 mg total) by mouth daily.  Marland Kitchen  cyclobenzaprine (FLEXERIL) 10 MG tablet take 1 tablet by mouth three times a day if needed for muscle spasm  . gabapentin (NEURONTIN) 300 MG capsule Take 1 capsule (300 mg total) by mouth 3 (three) times daily.  . hydrochlorothiazide (HYDRODIURIL) 25 MG tablet Take 1 tablet (25 mg total) by mouth daily.  Marland Kitchen levETIRAcetam (KEPPRA) 500 MG tablet Take 1 tablet (500 mg total) by mouth daily.  Marland Kitchen LORazepam (ATIVAN) 1 MG tablet Take 0.5-1 tablets (0.5-1 mg total) by mouth daily as needed for anxiety.  . predniSONE (DELTASONE) 20 MG tablet Take 2 tablets (40 mg total) by mouth daily with breakfast.  . rifaximin (XIFAXAN) 550 MG TABS tablet Take 1 tablet by mouth daily as needed.  . vortioxetine HBr (TRINTELLIX) 5 MG TABS tablet  Take 1 tablet (5 mg total) by mouth daily.  . [DISCONTINUED] vortioxetine HBr (TRINTELLIX) 5 MG TABS tablet Take 1 tablet (5 mg total) by mouth daily.  . montelukast (SINGULAIR) 10 MG tablet Take 1 tablet (10 mg total) by mouth at bedtime.   No facility-administered encounter medications on file as of 04/01/2020.   Patient Active Problem List   Diagnosis Date Noted  . Vaccine reaction, initial encounter 03/11/2020  . Fatigue 08/06/2019  . Irritable bowel syndrome with diarrhea   . Benign neoplasm of transverse colon   . Diarrhea 10/07/2018  . GERD (gastroesophageal reflux disease) 09/16/2018  . Advanced care planning/counseling discussion 09/16/2018  . Personal history of surgery to heart and great vessels, presenting hazards to health 09/16/2018  . Personal history of digestive disease 09/16/2018  . Hiatal hernia 07/29/2018  . Restless legs 03/07/2018  . Hypertension 10/26/2017  . Other hyperlipidemia 10/26/2017  . Chiari malformation type I ([place]) 08/07/2017  . Anxiety 08/07/2017  . Status post bariatric surgery 02/01/2017  . Depression, recurrent ([place])    Past Medical History:  Diagnosis Date  . Arthritis   . Chiari malformation type I ([place])   . Depression   . GERD (gastroesophageal reflux disease)   . History of hiatal hernia   . Hyperlipidemia   . Hypertension   . Sleep apnea   . Small intestinal bacterial overgrowth   . Spinal stenosis    Relevant past medical, surgical, family and social history reviewed and updated as indicated. Interim medical history since our last visit reviewed.  Review of Systems  Constitutional: Negative.  Negative for activity change, appetite change and fatigue.  Respiratory: Negative.  Negative for cough, shortness of breath and wheezing.   Cardiovascular: Negative.  Negative for chest pain and palpitations.  Skin: Negative.  Negative for color change.  Neurological: Negative.  Negative for dizziness, light-headedness, numbness and  headaches.  Psychiatric/Behavioral: Positive for sleep disturbance. Negative for agitation, decreased concentration and suicidal ideas. The patient is nervous/anxious. The patient is not hyperactive.     Per HPI unless specifically indicated above     Objective:    BP 118/77 (BP Location: Left Arm, Patient Position: Sitting, Cuff Size: Normal)   Pulse 77   Temp (!) 97.5 F (36.4 C) (Oral)   Wt 161 lb (73 kg)   SpO2 100%   BMI 29.45 kg/m   Wt Readings from Last 3 Encounters:  04/01/20 161 lb (73 kg)  09/22/19 158 lb (71.7 kg)  06/26/19 152 lb (68.9 kg)    Physical Exam Vitals and nursing note reviewed.  Constitutional:      General: She is not in acute distress.    Appearance: Normal appearance. She is not  toxic-appearing.  Cardiovascular:     Rate and Rhythm: Normal rate and regular rhythm.     Heart sounds: Normal heart sounds.  Pulmonary:     Effort: Pulmonary effort is normal. No respiratory distress.     Breath sounds: Normal breath sounds. No wheezing or rhonchi.  Skin:    General: Skin is warm and dry.     Coloration: Skin is not jaundiced or pale.  Neurological:     General: No focal deficit present.     Mental Status: She is alert and oriented to person, place, and time.     Motor: No weakness.     Gait: Gait normal.  Psychiatric:        Mood and Affect: Mood normal.        Behavior: Behavior normal.        Thought Content: Thought content normal.        Judgment: Judgment normal.      Assessment & Plan:   Problem List Items Addressed This Visit      Other   Depression, recurrent ([facility])    Chronic, stable.  PHQ-9 elevated, however patient reports good control of mood and declines dose adjustment of Trintellix at current time.  Will continue Trintellix 5 mg.  Advised to return or call clinic with any concerns.  Follow up in 4 months for wellness.      Relevant Medications   vortioxetine HBr (TRINTELLIX) 5 MG TABS tablet       Follow up plan: Return  in about 4 months (around 08/01/2020) for wellness with labs.

## 2020-04-01 NOTE — Assessment & Plan Note (Addendum)
Chronic, stable.  PHQ-9 elevated, however patient reports good control of mood and declines dose adjustment of Trintellix at current time.  Will continue Trintellix 5 mg.  Advised to return or call clinic with any concerns.  Continue weekly visits with counselor.  Follow up in 4 months for physical and wellness.

## 2020-04-01 NOTE — Patient Instructions (Signed)
Managing Stress, Adult Feeling a certain amount of stress is normal. Stress helps our body and mind get ready to deal with the demands of life. Stress hormones can motivate you to do well at work and meet your responsibilities. However severe or long-lasting (chronic) stress can affect your mental and physical health. Chronic stress puts you at higher risk for anxiety, depression, and other health problems like digestive problems, muscle aches, heart disease, high blood pressure, and stroke. What are the causes? Common causes of stress include:  Demands from work, such as deadlines, feeling overworked, or having long hours.  Pressures at home, such as money issues, disagreements with a spouse, or parenting issues.  Pressures from major life changes, such as divorce, moving, loss of a loved one, or chronic illness. You may be at higher risk for stress-related problems if you do not get enough sleep, are in poor health, do not have emotional support, or have a mental health disorder like anxiety or depression. How to recognize stress Stress can make you:  Have trouble sleeping.  Feel sad, anxious, irritable, or overwhelmed.  Lose your appetite.  Overeat or want to eat unhealthy foods.  Want to use drugs or alcohol. Stress can also cause physical symptoms, such as:  Sore, tense muscles, especially in the shoulders and neck.  Headaches.  Trouble breathing.  A faster heart rate.  Stomach pain, nausea, or vomiting.  Diarrhea or constipation.  Trouble concentrating. Follow these instructions at home: Lifestyle  Identify the source of your stress and your reaction to it. See a therapist who can help you change your reactions.  When there are stressful events: ? Talk about it with family, friends, or co-workers. ? Try to think realistically about stressful events and not ignore them or overreact. ? Try to find the positives in a stressful situation and not focus on the  negatives. ? Cut back on responsibilities at work and home, if possible. Ask for help from friends or family members if you need it.  Find ways to cope with stress, such as: ? Meditation. ? Deep breathing. ? Yoga or tai chi. ? Progressive muscle relaxation. ? Doing art, playing music, or reading. ? Making time for fun activities. ? Spending time with family and friends.  Get support from family, friends, or spiritual resources. Eating and drinking  Eat a healthy diet. This includes: ? Eating foods that are high in fiber, such as beans, whole grains, and fresh fruits and vegetables. ? Limiting foods that are high in fat and processed sugars, such as fried and sweet foods.  Do not skip meals or overeat.  Drink enough fluid to keep your urine pale yellow. Alcohol use  Do not drink alcohol if: ? Your health care provider tells you not to drink. ? You are pregnant, may be pregnant, or are planning to become pregnant.  Drinking alcohol is a way some people try to ease their stress. This can be dangerous, so if you drink alcohol: ? Limit how much you use to:  0-1 drink a day for women.  0-2 drinks a day for men. ? Be aware of how much alcohol is in your drink. In the [place] one drink equals one 12 oz bottle of beer (355 mL), one 5 oz glass of wine (148 mL), or one 1 oz glass of hard liquor (44 mL). Activity   Include 30 minutes of exercise in your daily schedule. Exercise is a good stress reducer.  Include time in your day   for an activity that you find relaxing. Try taking a walk, going on a bike ride, reading a book, or listening to music.  Schedule your time in a way that lowers stress, and keep a consistent schedule. Prioritize what is most important to get done. General instructions  Get enough sleep. Try to go to sleep and get up at about the same time every day.  Take over-the-counter and prescription medicines only as told by your health care provider.  Do not use any  products that contain nicotine or tobacco, such as cigarettes, e-cigarettes, and chewing tobacco. If you need help quitting, ask your health care provider.  Do not use drugs or smoke to cope with stress.  Keep all follow-up visits as told by your health care provider. This is important. Where to find support  Talk with your health care provider about stress management or finding a support group.  Find a therapist to work with you on your stress management techniques. Contact a health care provider if:  Your stress symptoms get worse.  You are unable to manage your stress at home.  You are struggling to stop using drugs or alcohol. Get help right away if:  You may be a danger to yourself or others.  You have any thoughts of death or suicide. If you ever feel like you may hurt yourself or others, or have thoughts about taking your own life, get help right away. You can go to your nearest emergency department or call:  Your local emergency services (911 in the [place]  A suicide crisis helpline, such as the [facility] at 7141399135. This is open 24 hours a day. Summary  Feeling a certain amount of stress is normal, but severe or long-lasting (chronic) stress can affect your mental and physical health.  Chronic stress can put you at higher risk for anxiety, depression, and other health problems like digestive problems, muscle aches, heart disease, high blood pressure, and stroke.  You may be at higher risk for stress-related problems if you do not get enough sleep, are in poor health, lack emotional support, or have a mental health disorder like anxiety or depression.  Identify the source of your stress and your reaction to it. Try talking about stressful events with family, friends, or co-workers, finding a coping method, or getting support from spiritual resources.  If you need more help, talk with your health care provider about finding a support group  or a mental health therapist. This information is not intended to replace advice given to you by your health care provider. Make sure you discuss any questions you have with your health care provider. Document Revised: 07/02/2019 Document Reviewed: 07/02/2019 Elsevier Patient Education  [place].

## 2020-04-05 DIAGNOSIS — F411 Generalized anxiety disorder: Secondary | ICD-10-CM | POA: Diagnosis not present

## 2020-04-08 ENCOUNTER — Telehealth: Payer: Self-pay | Admitting: Nurse Practitioner

## 2020-04-08 NOTE — Chronic Care Management (AMB) (Signed)
  Chronic Care Management   Note  04/08/2020 Name: Amanda Wang MRN: [id number] DOB: [birth year]  Amanda Wang is a 69 y.o. year old female who is a primary care patient of Carnella Guadalajara I, NP. I reached out to [employer] Distance by phone today in response to a referral sent by Amanda Wang's health plan.     Amanda Wang was given information about Chronic Care Management services today including:  1. CCM service includes personalized support from designated clinical staff supervised by her physician, including individualized plan of care and coordination with other care providers 2. 24/7 contact phone numbers for assistance for urgent and routine care needs. 3. Service will only be billed when office clinical staff spend 20 minutes or more in a month to coordinate care. 4. Only one practitioner may furnish and bill the service in a calendar month. 5. The patient may stop CCM services at any time (effective at the end of the month) by phone call to the office staff. 6. The patient will be responsible for cost sharing (co-pay) of up to 20% of the service fee (after annual deductible is met).  Patient agreed to services and verbal consent obtained.   Follow up plan: Telephone appointment with care management team member scheduled for:05/19/2020  Noreene Larsson, [place], [place], [address] [address] Direct Dial: [phone] [email or web address] Website: [email or web address]

## 2020-04-09 ENCOUNTER — Telehealth: Payer: Self-pay | Admitting: Nurse Practitioner

## 2020-04-09 ENCOUNTER — Other Ambulatory Visit: Payer: Self-pay | Admitting: Nurse Practitioner

## 2020-04-09 DIAGNOSIS — F339 Major depressive disorder, recurrent, unspecified: Secondary | ICD-10-CM

## 2020-04-09 MED ORDER — VORTIOXETINE HBR 5 MG PO TABS: 5.0000 mg | ORAL_TABLET | Freq: Every day | ORAL | 0 refills | Status: DC

## 2020-04-09 MED ORDER — VORTIOXETINE HBR 5 MG PO TABS: 5.0000 mg | ORAL_TABLET | Freq: Every day | ORAL | 5 refills | Status: DC

## 2020-04-09 NOTE — Telephone Encounter (Signed)
New Rx with refills sent in

## 2020-04-09 NOTE — Telephone Encounter (Signed)
30-day supply of Trintellix sent in per patient's request.

## 2020-04-09 NOTE — Telephone Encounter (Signed)
Pt inquiring about refills.

## 2020-04-09 NOTE — Telephone Encounter (Signed)
Copied from [place] [id number]. Topic: General - Other >> Apr 09, 2020  3:48 PM Mcneil, Ja-Kwan wrote: Reason for CRM: Pt stated she needs the Rx for vortioxetine HBr (TRINTELLIX) 5 MG TABS tablet to be switched to a 30 day supply because it is too expensive for the 90 day Rx.

## 2020-04-12 DIAGNOSIS — F411 Generalized anxiety disorder: Secondary | ICD-10-CM | POA: Diagnosis not present

## 2020-04-13 DIAGNOSIS — F411 Generalized anxiety disorder: Secondary | ICD-10-CM | POA: Diagnosis not present

## 2020-04-19 DIAGNOSIS — F411 Generalized anxiety disorder: Secondary | ICD-10-CM | POA: Diagnosis not present

## 2020-04-26 DIAGNOSIS — F411 Generalized anxiety disorder: Secondary | ICD-10-CM | POA: Diagnosis not present

## 2020-05-03 DIAGNOSIS — F411 Generalized anxiety disorder: Secondary | ICD-10-CM | POA: Diagnosis not present

## 2020-05-10 DIAGNOSIS — F411 Generalized anxiety disorder: Secondary | ICD-10-CM | POA: Diagnosis not present

## 2020-05-19 ENCOUNTER — Telehealth: Payer: Medicare Other

## 2020-05-19 ENCOUNTER — Ambulatory Visit: Payer: Self-pay | Admitting: General Practice

## 2020-05-19 NOTE — Chronic Care Management (AMB) (Signed)
  Chronic Care Management   Outreach Note  05/19/2020 Name: Amanda Wang MRN: [id number] DOB: 1951/06/10  Referred by: Helayne Seminole, NP Reason for referral : Chronic Care Management (Initial outreach: RNCM Chronic Disease Management and Care Coordination Needs)   An unsuccessful telephone outreach was attempted today. The patient was referred to the case management team for assistance with care management and care coordination.   Follow Up Plan: A HIPPA compliant phone message was left for the patient providing contact information and requesting a return call.   Noreene Larsson RN, MSN, [facility] Mobile: [phone]

## 2020-05-20 ENCOUNTER — Telehealth: Payer: Self-pay | Admitting: Nurse Practitioner

## 2020-05-20 NOTE — Chronic Care Management (AMB) (Signed)
°  Care Management   Note  05/20/2020 Name: Amanda Wang MRN: [id number] DOB: [birth year]  [name] is a 69 y.o. year old female who is a primary care patient of Carnella Guadalajara I, NP and is actively engaged with the care management team. I reached out to There[employer] Distance by phone today to assist with re-scheduling an initial visit with the RN Case Manager  Follow up plan: Telephone appointment with care management team member scheduled for:06/16/2020  Noreene Larsson, [place], [place], [place] [address] Direct Dial: [phone] [email or web address]: [email or web address]

## 2020-05-24 DIAGNOSIS — F411 Generalized anxiety disorder: Secondary | ICD-10-CM | POA: Diagnosis not present

## 2020-05-31 DIAGNOSIS — F411 Generalized anxiety disorder: Secondary | ICD-10-CM | POA: Diagnosis not present

## 2020-06-16 ENCOUNTER — Telehealth: Payer: Medicare Other

## 2020-06-16 ENCOUNTER — Ambulatory Visit: Payer: Self-pay | Admitting: General Practice

## 2020-06-16 NOTE — Chronic Care Management (AMB) (Signed)
°  Chronic Care Management   Outreach Note  06/16/2020 Name: Amanda Wang MRN: [id number] DOB: 1951-04-29  Referred by: Helayne Seminole, NP Reason for referral : Chronic Care Management (RNCM Initial outreach: Attempt 2: For Chronic Disease Management and Care Coordination Needs)   A second unsuccessful telephone outreach was attempted today. The patient was referred to the case management team for assistance with care management and care coordination.   Follow Up Plan: A HIPPA compliant phone message was left for the patient providing contact information and requesting a return call.   Noreene Larsson RN, MSN, [facility] Mobile: [phone]

## 2020-06-25 ENCOUNTER — Telehealth: Payer: Medicare Other

## 2020-07-01 ENCOUNTER — Other Ambulatory Visit: Payer: Self-pay

## 2020-07-01 MED ORDER — CYCLOBENZAPRINE HCL 10 MG PO TABS: ORAL_TABLET | ORAL | 0 refills | Status: DC

## 2020-07-05 NOTE — Telephone Encounter (Signed)
Message sent through My Chart. 

## 2020-07-16 ENCOUNTER — Other Ambulatory Visit: Payer: Self-pay

## 2020-07-16 ENCOUNTER — Encounter: Payer: Self-pay | Admitting: Nurse Practitioner

## 2020-07-16 ENCOUNTER — Ambulatory Visit (INDEPENDENT_AMBULATORY_CARE_PROVIDER_SITE_OTHER): Payer: Medicare Other | Admitting: Nurse Practitioner

## 2020-07-16 VITALS — BP 112/74 | HR 90 | Temp 98.1°F | Wt 161.0 lb

## 2020-07-16 DIAGNOSIS — G2581 Restless legs syndrome: Secondary | ICD-10-CM | POA: Diagnosis not present

## 2020-07-16 DIAGNOSIS — G935 Compression of brain: Secondary | ICD-10-CM | POA: Diagnosis not present

## 2020-07-16 DIAGNOSIS — J3089 Other allergic rhinitis: Secondary | ICD-10-CM

## 2020-07-16 DIAGNOSIS — I10 Essential (primary) hypertension: Secondary | ICD-10-CM | POA: Diagnosis not present

## 2020-07-16 DIAGNOSIS — F339 Major depressive disorder, recurrent, unspecified: Secondary | ICD-10-CM

## 2020-07-16 DIAGNOSIS — E785 Hyperlipidemia, unspecified: Secondary | ICD-10-CM

## 2020-07-16 DIAGNOSIS — Z131 Encounter for screening for diabetes mellitus: Secondary | ICD-10-CM | POA: Diagnosis not present

## 2020-07-16 DIAGNOSIS — K912 Postsurgical malabsorption, not elsewhere classified: Secondary | ICD-10-CM | POA: Diagnosis not present

## 2020-07-16 DIAGNOSIS — N183 Chronic kidney disease, stage 3 unspecified: Secondary | ICD-10-CM | POA: Diagnosis not present

## 2020-07-16 DIAGNOSIS — Z1329 Encounter for screening for other suspected endocrine disorder: Secondary | ICD-10-CM | POA: Diagnosis not present

## 2020-07-16 DIAGNOSIS — R7303 Prediabetes: Secondary | ICD-10-CM

## 2020-07-16 DIAGNOSIS — Z23 Encounter for immunization: Secondary | ICD-10-CM | POA: Diagnosis not present

## 2020-07-16 DIAGNOSIS — J309 Allergic rhinitis, unspecified: Secondary | ICD-10-CM | POA: Insufficient documentation

## 2020-07-16 MED ORDER — MONTELUKAST SODIUM 10 MG PO TABS: 10.0000 mg | ORAL_TABLET | Freq: Every day | ORAL | 1 refills | Status: DC

## 2020-07-16 MED ORDER — ATORVASTATIN CALCIUM 40 MG PO TABS: 40.0000 mg | ORAL_TABLET | Freq: Every day | ORAL | 1 refills | Status: DC

## 2020-07-16 MED ORDER — AMLODIPINE BESYLATE 5 MG PO TABS: 5.0000 mg | ORAL_TABLET | Freq: Every day | ORAL | 1 refills | Status: DC

## 2020-07-16 MED ORDER — VORTIOXETINE HBR 5 MG PO TABS: 5.0000 mg | ORAL_TABLET | Freq: Every day | ORAL | 1 refills | Status: DC

## 2020-07-16 MED ORDER — BENAZEPRIL HCL 20 MG PO TABS: 20.0000 mg | ORAL_TABLET | Freq: Every day | ORAL | 1 refills | Status: DC

## 2020-07-16 MED ORDER — LORAZEPAM 1 MG PO TABS: 0.5000 mg | ORAL_TABLET | Freq: Every day | ORAL | 0 refills | Status: DC | PRN

## 2020-07-16 MED ORDER — CYCLOBENZAPRINE HCL 10 MG PO TABS: ORAL_TABLET | ORAL | 1 refills | Status: DC

## 2020-07-16 MED ORDER — HYDROCHLOROTHIAZIDE 25 MG PO TABS: 25.0000 mg | ORAL_TABLET | Freq: Every day | ORAL | 1 refills | Status: DC

## 2020-07-16 MED ORDER — GABAPENTIN 300 MG PO CAPS: 300.0000 mg | ORAL_CAPSULE | Freq: Two times a day (BID) | ORAL | 1 refills | Status: DC | PRN

## 2020-07-16 MED ORDER — LEVETIRACETAM 500 MG PO TABS: 500.0000 mg | ORAL_TABLET | Freq: Every day | ORAL | 1 refills | Status: DC

## 2020-07-16 NOTE — Assessment & Plan Note (Addendum)
Chronic, stable.  BP at goal in office today.  Continue amlodipine, benazepril, and HCTZ, refills sent in.  CMP checked today.  Continue DASH diet and BP monitoring at home.  Follow up 6 months, sooner if anything changes.

## 2020-07-16 NOTE — Progress Notes (Signed)
BP 112/74   Pulse 90   Temp 98.1 F (36.7 C) (Oral)   Wt 161 lb (73 kg)   SpO2 94%   BMI 29.45 kg/m    Subjective:    Patient ID: Amanda Wang, female    DOB: February 18, 1951, 69 y.o.   MRN: [id number]  HPI: Amanda Wang is a 69 y.o. female presenting for medication refill.  Chief Complaint  Patient presents with  . Spasms    cyclobenzaprine refill   LEG CRAMPS/MUSCLE SPASMS Reports ongoing leg cramps that have been for years. Seems to be tolerating gabapentin 300 mg 2 tablets prn and cyclobenzaprine 10 mg prn well for this.   Duration: chronic Pain: no Severity: moderate  Quality:  cramping Location:  lower legs Bilateral:  yes Onset: sudden Frequency: intermittent Time of  day:   night time Sudden unintentional leg jerking:   yes Paresthesias:   no Decreased sensation:  no Weakness:   no Insomnia:   yes Fatigue:   no Alleviating factors: gabapentin, cyclobenzaprine Aggravating factors: standing on feet all day Status: fluctuating  HYPERTENSION / HYPERLIPIDEMIA Currently taking amlodipine 5 mg, benazepril 20 mg, and HCTZ 25 mg for blood pressure.  Taking atorvastatin 40 mg for high cholesterol.  Not fasting today. Satisfied with current treatment? yes Duration of hypertension: chronic BP monitoring frequency: couple of times per week BP range: 110s/70s BP medication side effects: no Past BP meds: amlodipine, benazepril, HCTZ Duration of hyperlipidemia: chronic Cholesterol medication side effects: no Cholesterol supplements: none Past cholesterol medications: atorvastatin Medication compliance: excellent compliance Aspirin: no Recent stressors: no Recurrent headaches: no Visual changes: no Palpitations: no Dyspnea: no Chest pain: no Lower extremity edema: no Dizzy/lightheaded: no  MOOD Patient reports good control of her mood with very seldom use of lorazepam and daily vortioxetine. Duration:controlled Anxious mood: no  Excessive worrying:  no Irritability: no  Sweating: no Nausea: no Palpitations:no Hyperventilation: no Panic attacks: no Agoraphobia: no  Obscessions/compulsions: no Depressed mood: yes Depression screen [facility] 2/9 07/16/2020 04/01/2020 03/11/2020 02/12/2020 09/22/2019  Decreased Interest 2 0 0 0 0  Down, Depressed, Hopeless 2 1 1 1  0  PHQ - 2 Score 4 1 1 1  0  Altered sleeping 3 3 - 3 -  Tired, decreased energy 2 2 - 2 -  Change in appetite 1 1 - 1 -  Feeling bad or failure about yourself  1 2 - 1 -  Trouble concentrating 0 0 - 3 -  Moving slowly or fidgety/restless 0 0 - 3 -  Suicidal thoughts 0 0 - 0 -  PHQ-9 Score 11 9 - 14 -  Difficult doing work/chores Somewhat difficult Not difficult at all - Not difficult at all -   Anhedonia: yes Weight changes: no Insomnia: yes   Hypersomnia: no Fatigue/loss of energy: yes Feelings of worthlessness: no Feelings of guilt: yes Impaired concentration/indecisiveness: no Suicidal ideations: no  Crying spells: no Recent Stressors/Life Changes: no  ALLERGIES Currently taking montelukast 10 mg daily for allergy symptoms. Duration: chronic Runny nose: no  Nasal congestion: no Nasal itching: no Sneezing: no Eye swelling, itching or discharge: no Post nasal drip: no Cough: no Sinus pressure: no  Ear pain: no  Ear pressure: no  Fever: no  Symptoms occur seasonally: yes Symptoms occur perenially: no Satisfied with current treatment: yes Allergist evaluation in past: no Allergen injection immunotherapy: no Recurrent sinus infections: no ENT evaluation in past: no Known environmental allergy: yes Indoor pets: no History of asthma: no Current  allergy medications: montelukast  Has concerns about a potential booster vaccine with previous reaction with 2nd Covid vaccine.  Allergies  Allergen Reactions  . Fexofenadine Anxiety    Makes her have a panic attack  . Codeine   . Methylphenidate Hcl     Other reaction(s): Other (See Comments) "Caused a  seizure"  . Zoloft [Sertraline]   . Magnevist [Gadopentetate] Other (See Comments)    IV Contrast: Other Reaction: hives/itching  . Sulfa Antibiotics Rash    Other reaction(s): Other (See Comments)   Outpatient Encounter Medications as of 07/16/2020  Medication Sig  . amLODipine (NORVASC) 5 MG tablet Take 1 tablet (5 mg total) by mouth daily.  Marland Kitchen atorvastatin (LIPITOR) 40 MG tablet Take 1 tablet (40 mg total) by mouth daily at 6 PM.  . benazepril (LOTENSIN) 20 MG tablet Take 1 tablet (20 mg total) by mouth daily.  . cyclobenzaprine (FLEXERIL) 10 MG tablet take 1 tablet by mouth three times a day if needed for muscle spasm  . gabapentin (NEURONTIN) 300 MG capsule Take 1 capsule (300 mg total) by mouth 2 (two) times daily as needed (leg cramps).  . hydrochlorothiazide (HYDRODIURIL) 25 MG tablet Take 1 tablet (25 mg total) by mouth daily.  Marland Kitchen levETIRAcetam (KEPPRA) 500 MG tablet Take 1 tablet (500 mg total) by mouth daily.  Marland Kitchen LORazepam (ATIVAN) 1 MG tablet Take 0.5-1 tablets (0.5-1 mg total) by mouth daily as needed for anxiety.  . vortioxetine HBr (TRINTELLIX) 5 MG TABS tablet Take 1 tablet (5 mg total) by mouth daily.  . [DISCONTINUED] amLODipine (NORVASC) 5 MG tablet Take 1 tablet (5 mg total) by mouth daily.  . [DISCONTINUED] atorvastatin (LIPITOR) 40 MG tablet Take 1 tablet (40 mg total) by mouth daily at 6 PM.  . [DISCONTINUED] benazepril (LOTENSIN) 20 MG tablet Take 1 tablet (20 mg total) by mouth daily.  . [DISCONTINUED] cyclobenzaprine (FLEXERIL) 10 MG tablet take 1 tablet by mouth three times a day if needed for muscle spasm  . [DISCONTINUED] gabapentin (NEURONTIN) 300 MG capsule Take 1 capsule (300 mg total) by mouth 3 (three) times daily.  . [DISCONTINUED] hydrochlorothiazide (HYDRODIURIL) 25 MG tablet Take 1 tablet (25 mg total) by mouth daily.  . [DISCONTINUED] levETIRAcetam (KEPPRA) 500 MG tablet Take 1 tablet (500 mg total) by mouth daily.  . [DISCONTINUED] LORazepam (ATIVAN) 1 MG  tablet Take 0.5-1 tablets (0.5-1 mg total) by mouth daily as needed for anxiety.  . [DISCONTINUED] vortioxetine HBr (TRINTELLIX) 5 MG TABS tablet Take 1 tablet (5 mg total) by mouth daily.  . montelukast (SINGULAIR) 10 MG tablet Take 1 tablet (10 mg total) by mouth at bedtime.  . rifaximin (XIFAXAN) 550 MG TABS tablet Take 1 tablet by mouth daily as needed. (Patient not taking: Reported on 07/16/2020)  . [DISCONTINUED] montelukast (SINGULAIR) 10 MG tablet Take 1 tablet (10 mg total) by mouth at bedtime.   No facility-administered encounter medications on file as of 07/16/2020.   Patient Active Problem List   Diagnosis Date Noted  . Allergic rhinitis due to allergen 07/16/2020  . Fatigue 08/06/2019  . Irritable bowel syndrome with diarrhea   . Benign neoplasm of transverse colon   . Diarrhea 10/07/2018  . GERD (gastroesophageal reflux disease) 09/16/2018  . Personal history of surgery to heart and great vessels, presenting hazards to health 09/16/2018  . Personal history of digestive disease 09/16/2018  . Hiatal hernia 07/29/2018  . Restless legs 03/07/2018  . Hypertension 10/26/2017  . Hyperlipidemia 10/26/2017  . Chiari  malformation type I ([place]) 08/07/2017  . Anxiety 08/07/2017  . Status post bariatric surgery 02/01/2017  . Depression, recurrent ([place])    Past Medical History:  Diagnosis Date  . Advanced care planning/counseling discussion 09/16/2018  . Arthritis   . Chiari malformation type I ([place])   . Depression   . GERD (gastroesophageal reflux disease)   . History of hiatal hernia   . Hyperlipidemia   . Hypertension   . Sleep apnea   . Small intestinal bacterial overgrowth   . Spinal stenosis   . Vaccine reaction, initial encounter 03/11/2020   Relevant past medical, surgical, family and social history reviewed and updated as indicated. Interim medical history since our last visit reviewed.  Review of Systems  Constitutional: Positive for fatigue. Negative for activity  change, appetite change and fever.  Eyes: Negative.  Negative for visual disturbance.  Respiratory: Negative.  Negative for cough, chest tightness, shortness of breath and wheezing.   Cardiovascular: Negative.  Negative for chest pain, palpitations and leg swelling.  Gastrointestinal: Negative.  Negative for abdominal distention, blood in stool, constipation, diarrhea, nausea and vomiting.  Skin: Negative.   Allergic/Immunologic: Negative.  Negative for environmental allergies.  Neurological: Negative.  Negative for dizziness, weakness, light-headedness, numbness and headaches.  Psychiatric/Behavioral: Positive for sleep disturbance. Negative for agitation, behavioral problems, confusion, decreased concentration and suicidal ideas. The patient is nervous/anxious.    Per HPI unless specifically indicated above     Objective:    BP 112/74   Pulse 90   Temp 98.1 F (36.7 C) (Oral)   Wt 161 lb (73 kg)   SpO2 94%   BMI 29.45 kg/m   Wt Readings from Last 3 Encounters:  07/16/20 161 lb (73 kg)  04/01/20 161 lb (73 kg)  09/22/19 158 lb (71.7 kg)    Physical Exam Vitals and nursing note reviewed.  Constitutional:      General: She is not in acute distress.    Appearance: Normal appearance. She is obese. She is not toxic-appearing.  Eyes:     General: No scleral icterus.    Extraocular Movements: Extraocular movements intact.  Cardiovascular:     Rate and Rhythm: Normal rate and regular rhythm.     Heart sounds: Normal heart sounds. No murmur heard.   Pulmonary:     Effort: Pulmonary effort is normal. No respiratory distress.     Breath sounds: Normal breath sounds. No wheezing, rhonchi or rales.  Abdominal:     General: Abdomen is flat. Bowel sounds are normal.     Palpations: Abdomen is soft.     Tenderness: There is no abdominal tenderness.  Musculoskeletal:        General: No swelling or tenderness. Normal range of motion.  Skin:    General: Skin is warm and dry.      Capillary Refill: Capillary refill takes less than 2 seconds.     Coloration: Skin is not jaundiced.     Findings: No bruising.  Neurological:     General: No focal deficit present.     Mental Status: She is alert and oriented to person, place, and time.     Motor: No weakness.     Gait: Gait normal.  Psychiatric:        Mood and Affect: Mood normal.        Behavior: Behavior normal.        Thought Content: Thought content normal.        Judgment: Judgment normal.  Assessment & Plan:   Problem List Items Addressed This Visit      Cardiovascular and Mediastinum   Hypertension    Chronic, stable.  BP at goal in office today.  Continue amlodipine, benazepril, and HCTZ, refills sent in.  CMP checked today.  Continue DASH diet and BP monitoring at home.  Follow up 6 months, sooner if anything changes.      Relevant Medications   amLODipine (NORVASC) 5 MG tablet   atorvastatin (LIPITOR) 40 MG tablet   benazepril (LOTENSIN) 20 MG tablet   hydrochlorothiazide (HYDRODIURIL) 25 MG tablet   Other Relevant Orders   Comprehensive metabolic panel   CBC with Differential/Platelet     Respiratory   Allergic rhinitis due to allergen    Chronic, stable on montelukast.  Continue montelukast, refill given.  Call or return to clinic with any concerns.        Relevant Medications   montelukast (SINGULAIR) 10 MG tablet   Other Relevant Orders   CBC with Differential/Platelet     Nervous and Auditory   Chiari malformation type I ([place])    With history of surgery; will continue on Keppra 500 mg daily.  Refills sent in for 6 months.      Relevant Medications   levETIRAcetam (KEPPRA) 500 MG tablet     Other   Hyperlipidemia    Chronic, ongoing.  Appears to be stable on atorvastatin 40 mg, refills given and continue this medication for now.  Lipids checked today.  Follow up 6 months or sooner if concerns develop.      Relevant Medications   amLODipine (NORVASC) 5 MG tablet    atorvastatin (LIPITOR) 40 MG tablet   benazepril (LOTENSIN) 20 MG tablet   hydrochlorothiazide (HYDRODIURIL) 25 MG tablet   Other Relevant Orders   Lipid Panel w/o Chol/HDL Ratio   Depression, recurrent ([facility]) - Primary    Chronic, stable per patient although PHQ-9 elevated today.  Does not desire dose changes on vortioxetine; continue at 5 mg.  Refill sent in.  RP and PT are aware of risks of psychoactive medication use to include increased sedation, respiratory suppression, falls, extrapyramidal movements,  dependence and cardiovascular events.  RP and PT would like to continue treatment as benefit determined to outweigh risk.  PDMP reviewed and refill given for lorazepam #30 to last at least 6 months.  Follow up 6 months.  Patient to call with any concerns in meantime.      Relevant Medications   LORazepam (ATIVAN) 1 MG tablet   vortioxetine HBr (TRINTELLIX) 5 MG TABS tablet   Other Relevant Orders   TSH   Comprehensive metabolic panel   CBC with Differential/Platelet   Restless legs    Chronic, stable.  Appears well-controlled with gabapentin and flexeril prn.  Uses flexeril very seldom.  Continue for now.  Call or return to clinic with any concerns.       Other Visit Diagnoses    Postsurgical malabsorption, not elsewhere classified        Relevant Orders   HgB A1c   Thyroid disorder screen       Need for pneumococcal vaccine       Relevant Orders   Pneumococcal conjugate vaccine 13-valent IM (Completed)   Screening for diabetes mellitus (DM)       Relevant Orders   HgB A1c       Follow up plan: Return in about 6 months (around 01/16/2021) for follow up.

## 2020-07-16 NOTE — Assessment & Plan Note (Addendum)
Chronic, stable per patient although PHQ-9 elevated today.  Does not desire dose changes on vortioxetine; continue at 5 mg.  Refill sent in.  RP and PT are aware of risks of psychoactive medication use to include increased sedation, respiratory suppression, falls, extrapyramidal movements,  dependence and cardiovascular events.  RP and PT would like to continue treatment as benefit determined to outweigh risk.  PDMP reviewed and refill given for lorazepam #30 to last at least 6 months.  Follow up 6 months.  Patient to call with any concerns in meantime.

## 2020-07-16 NOTE — Patient Instructions (Signed)
Nice seeing you today, Amanda Wang!  We will message you with your lab results on Monday.  I will send refills of all of your medication into the pharmacy.  Please let me know if you need anything.  Tailbone Injury The tailbone is the small bone at the lower end of the backbone (spine). The tailbone can become injured from:  A fall.  Sitting to row or bike for a long time.  Having a baby. This type of injury can be painful. Most tailbone injuries get better on their own in 4-6 weeks. Follow these instructions at home: Activity  Avoid sitting in one place for a long time.  Wear proper pads and gear when riding a bike or rowing.  Increase your activity as the pain allows.  Do exercises as told by your doctor or physical therapist. Managing pain, stiffness, and swelling  To lessen pain: ? Sit on a large, rubber or inflated ring or cushion. ? Lean forward when you sit.  If told, apply ice to the injured area. ? Put ice in a plastic bag. ? Place a towel between your skin and the bag. ? Leave the ice on for 20 minutes, 2-3 times per day. Do this for the first 1-2 days.  If told, put heat on the injured area. Do this as often as told by your doctor. Use the heat source that your doctor recommends, such as a moist heat pack or a heating pad. ? Place a towel between your skin and the heat source. ? Leave the heat on for 20-30 minutes. ? Remove the heat if your skin turns bright red. This is very important if you are unable to feel pain, heat, or cold. You may have a greater risk of getting burned. General instructions  Take over-the-counter and prescription medicines only as told by your doctor.  To prevent or treat trouble pooping (constipation) or pain when pooping, your doctor may suggest that you: ? Drink enough fluid to keep your pee (urine) pale yellow. ? Eat foods that are high in fiber. These include fresh fruits and vegetables, whole grains, and beans. ? Limit foods that are  high in fat and sugar. These include fried and sweet foods. ? Take an over-the-counter or prescription medicine to treat trouble pooping.  Keep all follow-up visits as told by your doctor. This is important. Contact a doctor if:  Your pain gets worse.  Pooping causes you pain.  You cannot poop after 4 days.  You have pain during sex. Summary  A tailbone injury can happen from a fall, from sitting for a long time to row or bike, or after having a baby.  These injuries can be painful. Most tailbone injuries get better on their own in 4-6 weeks.  Sit on a large, rubber or inflated ring or cushion to lessen pain.  Avoid sitting in one place for a long time.  Follow your doctor's suggestions to prevent or treat trouble pooping. This information is not intended to replace advice given to you by your health care provider. Make sure you discuss any questions you have with your health care provider. Document Revised: 01/01/2018 Document Reviewed: 01/01/2018 Elsevier Patient Education  2020 [employer].

## 2020-07-16 NOTE — Assessment & Plan Note (Signed)
Chronic, stable.  Appears well-controlled with gabapentin and flexeril prn.  Uses flexeril very seldom.  Continue for now.  Call or return to clinic with any concerns.

## 2020-07-16 NOTE — Assessment & Plan Note (Signed)
Chronic, ongoing.  Appears to be stable on atorvastatin 40 mg, refills given and continue this medication for now.  Lipids checked today.  Follow up 6 months or sooner if concerns develop.

## 2020-07-16 NOTE — Assessment & Plan Note (Addendum)
With history of surgery; will continue on Keppra 500 mg daily.  Refills sent in for 6 months.

## 2020-07-16 NOTE — Assessment & Plan Note (Signed)
Chronic, stable on montelukast.  Continue montelukast, refill given.  Call or return to clinic with any concerns.

## 2020-07-17 LAB — COMPREHENSIVE METABOLIC PANEL
ALT: 13 IU/L (ref 0–32)
AST: 12 IU/L (ref 0–40)
Albumin/Globulin Ratio: 1.6 (ref 1.2–2.2)
Albumin: 4.7 g/dL (ref 3.8–4.8)
Alkaline Phosphatase: 145 IU/L — ABNORMAL HIGH (ref 48–121)
BUN/Creatinine Ratio: 16 (ref 12–28)
BUN: 12 mg/dL (ref 8–27)
Bilirubin Total: 0.4 mg/dL (ref 0.0–1.2)
CO2: 28 mmol/L (ref 20–29)
Calcium: 9.9 mg/dL (ref 8.7–10.3)
Chloride: 97 mmol/L (ref 96–106)
Creatinine, Ser: 0.76 mg/dL (ref 0.57–1.00)
GFR calc Af Amer: 94 mL/min/{1.73_m2} (ref >59)
GFR calc non Af Amer: 81 mL/min/{1.73_m2} (ref >59)
Globulin, Total: 3 g/dL (ref 1.5–4.5)
Glucose: 148 mg/dL — ABNORMAL HIGH (ref 65–99)
Potassium: 3.1 mmol/L — ABNORMAL LOW (ref 3.5–5.2)
Sodium: 140 mmol/L (ref 134–144)
Total Protein: 7.7 g/dL (ref 6.0–8.5)

## 2020-07-17 LAB — TSH: TSH: 5.02 u[IU]/mL — ABNORMAL HIGH (ref 0.450–4.500)

## 2020-07-17 LAB — CBC WITH DIFFERENTIAL/PLATELET
Basophils Absolute: 0.1 10*3/uL (ref 0.0–0.2)
Basos: 1 %
EOS (ABSOLUTE): 0.1 10*3/uL (ref 0.0–0.4)
Eos: 1 %
Hematocrit: 39.5 % (ref 34.0–46.6)
Hemoglobin: 12.1 g/dL (ref 11.1–15.9)
Immature Grans (Abs): 0 10*3/uL (ref 0.0–0.1)
Immature Granulocytes: 0 %
Lymphocytes Absolute: 1.5 10*3/uL (ref 0.7–3.1)
Lymphs: 14 %
MCH: 25.1 pg — ABNORMAL LOW (ref 26.6–33.0)
MCHC: 30.6 g/dL — ABNORMAL LOW (ref 31.5–35.7)
MCV: 82 fL (ref 79–97)
Monocytes Absolute: 0.7 10*3/uL (ref 0.1–0.9)
Monocytes: 7 %
Neutrophils Absolute: 7.8 10*3/uL — ABNORMAL HIGH (ref 1.4–7.0)
Neutrophils: 77 %
Platelets: 415 10*3/uL (ref 150–450)
RBC: 4.82 x10E6/uL (ref 3.77–5.28)
RDW: 14.8 % (ref 11.7–15.4)
WBC: 10.2 10*3/uL (ref 3.4–10.8)

## 2020-07-17 LAB — HEMOGLOBIN A1C
Est. average glucose Bld gHb Est-mCnc: 134 mg/dL
Hgb A1c MFr Bld: 6.3 % — ABNORMAL HIGH (ref 4.8–5.6)

## 2020-07-17 LAB — LIPID PANEL W/O CHOL/HDL RATIO
Cholesterol, Total: 176 mg/dL (ref 100–199)
HDL: 63 mg/dL (ref >39)
LDL Chol Calc (NIH): 88 mg/dL (ref 0–99)
Triglycerides: 148 mg/dL (ref 0–149)
VLDL Cholesterol Cal: 25 mg/dL (ref 5–40)

## 2020-07-20 ENCOUNTER — Encounter: Payer: Self-pay | Admitting: Nurse Practitioner

## 2020-07-20 ENCOUNTER — Telehealth: Payer: Self-pay | Admitting: Nurse Practitioner

## 2020-07-20 DIAGNOSIS — F411 Generalized anxiety disorder: Secondary | ICD-10-CM | POA: Diagnosis not present

## 2020-07-20 NOTE — Addendum Note (Signed)
Addended by: Carnella Guadalajara I on: 07/20/2020 08:22 AM   Modules accepted: Orders

## 2020-07-20 NOTE — Telephone Encounter (Signed)
Left message to call back to clinic to discuss lab results.

## 2020-07-20 NOTE — Addendum Note (Signed)
Addended by: Dorris Fetch on: 07/20/2020 08:24 AM   Modules accepted: Orders

## 2020-07-21 LAB — THYROID PANEL WITH TSH
Free Thyroxine Index: 1.9 (ref 1.2–4.9)
T3 Uptake Ratio: 24 % (ref 24–39)
T4, Total: 7.8 ug/dL (ref 4.5–12.0)
TSH: 4.77 u[IU]/mL — ABNORMAL HIGH (ref 0.450–4.500)

## 2020-07-21 LAB — SPECIMEN STATUS REPORT

## 2020-07-22 ENCOUNTER — Telehealth: Payer: Self-pay | Admitting: Nurse Practitioner

## 2020-07-22 DIAGNOSIS — F339 Major depressive disorder, recurrent, unspecified: Secondary | ICD-10-CM

## 2020-07-22 DIAGNOSIS — I10 Essential (primary) hypertension: Secondary | ICD-10-CM

## 2020-07-22 DIAGNOSIS — E1169 Type 2 diabetes mellitus with other specified complication: Secondary | ICD-10-CM | POA: Insufficient documentation

## 2020-07-22 DIAGNOSIS — R7303 Prediabetes: Secondary | ICD-10-CM | POA: Insufficient documentation

## 2020-07-22 NOTE — Telephone Encounter (Signed)
Called and discussed labs with patient.  Thyroid subclinical - will not treat for now but will keep a close eye on.  Will recheck CBC and add anemia panel, CMP in about 4 weeks.  Closely watch HbA1c and work on lifestyle modifications.

## 2020-07-28 ENCOUNTER — Telehealth: Payer: Medicare Other

## 2020-07-28 ENCOUNTER — Telehealth: Payer: Self-pay | Admitting: General Practice

## 2020-07-28 NOTE — Telephone Encounter (Signed)
°  Chronic Care Management   Outreach Note  07/28/2020 Name: Amanda Wang MRN: [id number] DOB: March 18, 1951  Referred by: Helayne Seminole, NP Reason for referral : Appointment (RNCM 3rd attempt at Initial outreach: Chronic Disease Management and Care Coordination Needs )   Third unsuccessful telephone outreach was attempted today. The patient was referred to the case management team for assistance with care management and care coordination. The patient's primary care provider has been notified of our unsuccessful attempts to make or maintain contact with the patient. The care management team is pleased to engage with this patient at any time in the future should he/she be interested in assistance from the care management team.   Follow Up Plan: The care management team is available to follow up with the patient after provider conversation with the patient regarding recommendation for care management engagement and subsequent re-referral to the care management team.   Noreene Larsson RN, MSN, [facility] Mobile: [phone]

## 2020-09-02 DIAGNOSIS — F411 Generalized anxiety disorder: Secondary | ICD-10-CM | POA: Diagnosis not present

## 2020-09-13 DIAGNOSIS — F411 Generalized anxiety disorder: Secondary | ICD-10-CM | POA: Diagnosis not present

## 2020-09-15 ENCOUNTER — Telehealth: Payer: Self-pay | Admitting: Nurse Practitioner

## 2020-09-15 NOTE — Telephone Encounter (Signed)
Copied from [facility] [id number]. Topic: Medicare AWV >> Sep 15, 2020  3:34 PM Weston Anna wrote: Reason for CRM:   Left message to reschedule AWVS on Oct 13,2021. Please schedule to be done by phone with [facility]   45 minute appointment.

## 2020-09-29 ENCOUNTER — Ambulatory Visit: Payer: Medicare Other

## 2020-09-29 DIAGNOSIS — F411 Generalized anxiety disorder: Secondary | ICD-10-CM | POA: Diagnosis not present

## 2020-10-04 ENCOUNTER — Ambulatory Visit: Payer: Medicare Other

## 2020-10-07 DIAGNOSIS — F411 Generalized anxiety disorder: Secondary | ICD-10-CM | POA: Diagnosis not present

## 2020-10-15 ENCOUNTER — Ambulatory Visit (INDEPENDENT_AMBULATORY_CARE_PROVIDER_SITE_OTHER): Payer: Medicare Other

## 2020-10-15 VITALS — Ht 62.0 in | Wt 164.0 lb

## 2020-10-15 DIAGNOSIS — Z0000 Encounter for general adult medical examination without abnormal findings: Secondary | ICD-10-CM

## 2020-10-15 NOTE — Patient Instructions (Signed)
[name] , Thank you for taking time to come for your Medicare Wellness Visit. I appreciate your ongoing commitment to your health goals. Please review the following plan we discussed and let me know if I can assist you in the future.   Screening recommendations/referrals: Colonoscopy: completed 11/28/2018, due 11/28/2028 Mammogram: patient to schedule Bone Density: completed 06/06/2011 Recommended yearly ophthalmology/optometry visit for glaucoma screening and checkup Recommended yearly dental visit for hygiene and checkup  Vaccinations: Influenza vaccine: due Pneumococcal vaccine: completed 07/16/2020 Tdap vaccine: completed 07/31/2016 Shingles vaccine: discussed   Covid-19: 03/09/2020, 02/13/2020  Advanced directives: Please bring a copy of your POA (Power of Attorney) and/or Living Will to your next appointment.   Conditions/risks identified: none  Next appointment: Follow up in one year for your annual wellness visit    Preventive Care 65 Years and Older, Female Preventive care refers to lifestyle choices and visits with your health care provider that can promote health and wellness. What does preventive care include?  A yearly physical exam. This is also called an annual well check.  Dental exams once or twice a year.  Routine eye exams. Ask your health care provider how often you should have your eyes checked.  Personal lifestyle choices, including:  Daily care of your teeth and gums.  Regular physical activity.  Eating a healthy diet.  Avoiding tobacco and drug use.  Limiting alcohol use.  Practicing safe sex.  Taking low-dose aspirin every day.  Taking vitamin and mineral supplements as recommended by your health care provider. What happens during an annual well check? The services and screenings done by your health care provider during your annual well check will depend on your age, overall health, lifestyle risk factors, and family history of  disease. Counseling  Your health care provider may ask you questions about your:  Alcohol use.  Tobacco use.  Drug use.  Emotional well-being.  Home and relationship well-being.  Sexual activity.  Eating habits.  History of falls.  Memory and ability to understand (cognition).  Work and work Statistician.  Reproductive health. Screening  You may have the following tests or measurements:  Height, weight, and BMI.  Blood pressure.  Lipid and cholesterol levels. These may be checked every 5 years, or more frequently if you are over 33 years old.  Skin check.  Lung cancer screening. You may have this screening every year starting at age 90 if you have a 30-pack-year history of smoking and currently smoke or have quit within the past 15 years.  Fecal occult blood test (FOBT) of the stool. You may have this test every year starting at age 55.  Flexible sigmoidoscopy or colonoscopy. You may have a sigmoidoscopy every 5 years or a colonoscopy every 10 years starting at age 11.  Hepatitis C blood test.  Hepatitis B blood test.  Sexually transmitted disease (STD) testing.  Diabetes screening. This is done by checking your blood sugar (glucose) after you have not eaten for a while (fasting). You may have this done every 1-3 years.  Bone density scan. This is done to screen for osteoporosis. You may have this done starting at age 2.  Mammogram. This may be done every 1-2 years. Talk to your health care provider about how often you should have regular mammograms. Talk with your health care provider about your test results, treatment options, and if necessary, the need for more tests. Vaccines  Your health care provider may recommend certain vaccines, such as:  Influenza vaccine. This is  recommended every year.  Tetanus, diphtheria, and acellular pertussis (Tdap, Td) vaccine. You may need a Td booster every 10 years.  Zoster vaccine. You may need this after age  76.  Pneumococcal 13-valent conjugate (PCV13) vaccine. One dose is recommended after age 2.  Pneumococcal polysaccharide (PPSV23) vaccine. One dose is recommended after age 69. Talk to your health care provider about which screenings and vaccines you need and how often you need them. This information is not intended to replace advice given to you by your health care provider. Make sure you discuss any questions you have with your health care provider. Document Released: 12/31/2015 Document Revised: 08/23/2016 Document Reviewed: 10/05/2015 Elsevier Interactive Patient Education  2017 [place] Prevention in the Home Falls can cause injuries. They can happen to people of all ages. There are many things you can do to make your home safe and to help prevent falls. What can I do on the outside of my home?  Regularly fix the edges of walkways and driveways and fix any cracks.  Remove anything that might make you trip as you walk through a door, such as a raised step or threshold.  Trim any bushes or trees on the path to your home.  Use bright outdoor lighting.  Clear any walking paths of anything that might make someone trip, such as rocks or tools.  Regularly check to see if handrails are loose or broken. Make sure that both sides of any steps have handrails.  Any raised decks and porches should have guardrails on the edges.  Have any leaves, snow, or ice cleared regularly.  Use sand or salt on walking paths during winter.  Clean up any spills in your garage right away. This includes oil or grease spills. What can I do in the bathroom?  Use night lights.  Install grab bars by the toilet and in the tub and shower. Do not use towel bars as grab bars.  Use non-skid mats or decals in the tub or shower.  If you need to sit down in the shower, use a plastic, non-slip stool.  Keep the floor dry. Clean up any water that spills on the floor as soon as it happens.  Remove  soap buildup in the tub or shower regularly.  Attach bath mats securely with double-sided non-slip rug tape.  Do not have throw rugs and other things on the floor that can make you trip. What can I do in the bedroom?  Use night lights.  Make sure that you have a light by your bed that is easy to reach.  Do not use any sheets or blankets that are too big for your bed. They should not hang down onto the floor.  Have a firm chair that has side arms. You can use this for support while you get dressed.  Do not have throw rugs and other things on the floor that can make you trip. What can I do in the kitchen?  Clean up any spills right away.  Avoid walking on wet floors.  Keep items that you use a lot in easy-to-reach places.  If you need to reach something above you, use a strong step stool that has a grab bar.  Keep electrical cords out of the way.  Do not use floor polish or wax that makes floors slippery. If you must use wax, use non-skid floor wax.  Do not have throw rugs and other things on the floor that can make you trip.  What can I do with my stairs?  Do not leave any items on the stairs.  Make sure that there are handrails on both sides of the stairs and use them. Fix handrails that are broken or loose. Make sure that handrails are as long as the stairways.  Check any carpeting to make sure that it is firmly attached to the stairs. Fix any carpet that is loose or worn.  Avoid having throw rugs at the top or bottom of the stairs. If you do have throw rugs, attach them to the floor with carpet tape.  Make sure that you have a light switch at the top of the stairs and the bottom of the stairs. If you do not have them, ask someone to add them for you. What else can I do to help prevent falls?  Wear shoes that:  Do not have high heels.  Have rubber bottoms.  Are comfortable and fit you well.  Are closed at the toe. Do not wear sandals.  If you use a  stepladder:  Make sure that it is fully opened. Do not climb a closed stepladder.  Make sure that both sides of the stepladder are locked into place.  Ask someone to hold it for you, if possible.  Clearly mark and make sure that you can see:  Any grab bars or handrails.  First and last steps.  Where the edge of each step is.  Use tools that help you move around (mobility aids) if they are needed. These include:  Canes.  Walkers.  Scooters.  Crutches.  Turn on the lights when you go into a dark area. Replace any light bulbs as soon as they burn out.  Set up your furniture so you have a clear path. Avoid moving your furniture around.  If any of your floors are uneven, fix them.  If there are any pets around you, be aware of where they are.  Review your medicines with your doctor. Some medicines can make you feel dizzy. This can increase your chance of falling. Ask your doctor what other things that you can do to help prevent falls. This information is not intended to replace advice given to you by your health care provider. Make sure you discuss any questions you have with your health care provider. Document Released: 09/30/2009 Document Revised: 05/11/2016 Document Reviewed: 01/08/2015 Elsevier Interactive Patient Education  2017 [employer].

## 2020-10-15 NOTE — Progress Notes (Signed)
I connected with Amanda Wang today by telephone and verified that I am speaking with the correct person using two identifiers. Location patient: home Location provider: work Persons participating in the virtual visit: Judyann, [place] LPN.   I discussed the limitations, risks, security and privacy concerns of performing an evaluation and management service by telephone and the availability of in person appointments. I also discussed with the patient that there may be a patient responsible charge related to this service. The patient expressed understanding and verbally consented to this telephonic visit.    Interactive audio and video telecommunications were attempted between this provider and patient, however failed, due to patient having technical difficulties OR patient did not have access to video capability.  We continued and completed visit with audio only.     Vital signs may be patient reported or missing.  Subjective:   Amanda Wang is a 69 y.o. female who presents for Medicare Annual (Subsequent) preventive examination.  Review of Systems      Cardiac Risk Factors include: advanced age (>86men, >20 women);dyslipidemia;hypertension;obesity (BMI >30kg/m2)     Objective:    Today's Vitals   10/15/20 1026  Weight: 164 lb (74.4 kg)  Height: 5\' 2"  (1.575 m)   Body mass index is 30 kg/m.  Advanced Directives 10/15/2020 09/22/2019 11/28/2018  Does Patient Have a Medical Advance Directive? Yes Yes Yes  Type of Paramedic of [place];Living will Living will;Healthcare Power of [place];Living will  Copy of [place] in Chart? No - copy requested No - copy requested No - copy requested    Current Medications (verified) Outpatient Encounter Medications as of 10/15/2020  Medication Sig  . amLODipine (NORVASC) 5 MG tablet Take 1 tablet (5 mg total) by mouth daily.  Marland Kitchen atorvastatin  (LIPITOR) 40 MG tablet Take 1 tablet (40 mg total) by mouth daily at 6 PM.  . benazepril (LOTENSIN) 20 MG tablet Take 1 tablet (20 mg total) by mouth daily.  . cyclobenzaprine (FLEXERIL) 10 MG tablet take 1 tablet by mouth three times a day if needed for muscle spasm  . gabapentin (NEURONTIN) 300 MG capsule Take 1 capsule (300 mg total) by mouth 2 (two) times daily as needed (leg cramps).  . hydrochlorothiazide (HYDRODIURIL) 25 MG tablet Take 1 tablet (25 mg total) by mouth daily.  Marland Kitchen levETIRAcetam (KEPPRA) 500 MG tablet Take 1 tablet (500 mg total) by mouth daily.  Marland Kitchen LORazepam (ATIVAN) 1 MG tablet Take 0.5-1 tablets (0.5-1 mg total) by mouth daily as needed for anxiety.  . montelukast (SINGULAIR) 10 MG tablet Take 1 tablet (10 mg total) by mouth at bedtime.  . rifaximin (XIFAXAN) 550 MG TABS tablet Take 1 tablet by mouth daily as needed.   . vortioxetine HBr (TRINTELLIX) 5 MG TABS tablet Take 1 tablet (5 mg total) by mouth daily.   No facility-administered encounter medications on file as of 10/15/2020.    Allergies (verified) Fexofenadine, Codeine, Methylphenidate hcl, Zoloft [sertraline], Magnevist [gadopentetate], and Sulfa antibiotics   History: Past Medical History:  Diagnosis Date  . Advanced care planning/counseling discussion 09/16/2018  . Arthritis   . Chiari malformation type I ([place])   . Depression   . GERD (gastroesophageal reflux disease)   . History of hiatal hernia   . Hyperlipidemia   . Hypertension   . Sleep apnea   . Small intestinal bacterial overgrowth   . Spinal stenosis   . Vaccine reaction, initial encounter 03/11/2020  Past Surgical History:  Procedure Laterality Date  . BREAST BIOPSY Right    neg  . chiari malformation I decompression surgery  11-06  . COLONOSCOPY WITH PROPOFOL N/A 11/28/2018   Procedure: COLONOSCOPY WITH PROPOFOL;  Surgeon: Jonathon Bellows, MD;  Location: [facility] ENDOSCOPY;  Service: Gastroenterology;  Laterality: N/A;  . deviated septum  repair    . HERNIA REPAIR     Family History  Problem Relation Age of Onset  . Hypertension Mother   . Cancer Mother   . Stroke Mother   . Heart disease Father   . Breast cancer Sister 59  . Breast cancer Paternal Aunt    Social History   Socioeconomic History  . Marital status: Divorced    Spouse name: Not on file  . Number of children: Not on file  . Years of education: Not on file  . Highest education level: Bachelor's degree (e.g., BA, AB, BS)  Occupational History  . Occupation: part time   Tobacco Use  . Smoking status: Former Smoker    Types: Cigarettes    Quit date: 07/26/1977    Years since quitting: 43.2  . Smokeless tobacco: Never Used  . Tobacco comment: barley smoked when smoked   Vaping Use  . Vaping Use: Never used  Substance and Sexual Activity  . Alcohol use: Yes    Alcohol/week: 0.0 standard drinks    Comment: on occasion  . Drug use: No  . Sexual activity: Not on file  Other Topics Concern  . Not on file  Social History Narrative  . Not on file   Social Determinants of Health   Financial Resource Strain: Low Risk   . Difficulty of Paying Living Expenses: Not hard at all  Food Insecurity: No Food Insecurity  . Worried About Charity fundraiser in the Last Year: Never true  . Ran Out of Food in the Last Year: Never true  Transportation Needs: No Transportation Needs  . Lack of Transportation (Medical): No  . Lack of Transportation (Non-Medical): No  Physical Activity: Inactive  . Days of Exercise per Week: 0 days  . Minutes of Exercise per Session: 0 min  Stress: No Stress Concern Present  . Feeling of Stress : Only a little  Social Connections:   . Frequency of Communication with Friends and Family: Not on file  . Frequency of Social Gatherings with Friends and Family: Not on file  . Attends Religious Services: Not on file  . Active Member of Clubs or Organizations: Not on file  . Attends Archivist Meetings: Not on file  .  Marital Status: Not on file    Tobacco Counseling Counseling given: Not Answered Comment: barley smoked when smoked    Clinical Intake:  Pre-visit preparation completed: Yes  Pain : No/denies pain     Nutritional Status: BMI > 30  Obese Nutritional Risks: None Diabetes: No  How often do you need to have someone help you when you read instructions, pamphlets, or other written materials from your doctor or pharmacy?: 1 - Never What is the last grade level you completed in school?: bachelor's degree  Diabetic? no  Interpreter Needed?: No  Information entered by :: NAllen LPN   Activities of Daily Living In your present state of health, do you have any difficulty performing the following activities: 10/15/2020  Hearing? N  Vision? N  Difficulty concentrating or making decisions? Y  Comment always had trouble with making decisions, has ADD  Walking or climbing  stairs? Y  Comment has to take time  Dressing or bathing? N  Doing errands, shopping? N  Preparing Food and eating ? N  Using the Toilet? N  In the past six months, have you accidently leaked urine? Y  Comment one time, held too long  Do you have problems with loss of bowel control? N  Managing your Medications? N  Managing your Finances? N  Housekeeping or managing your Housekeeping? N  Some recent data might be hidden    Patient Care Team: Eulogio Bear, NP as PCP - General (Nurse Practitioner) Lucilla Lame, MD as Consulting Physician (Gastroenterology) Vanita Ingles, RN as Case Manager (General Practice)  Indicate any recent Medical Services you may have received from other than [facility] providers in the past year (date may be approximate).     Assessment:   This is a routine wellness examination for Amanda Wang.  Hearing/Vision screen  Hearing Screening   125Hz  250Hz  500Hz  1000Hz  2000Hz  3000Hz  4000Hz  6000Hz  8000Hz   Right ear:           Left ear:           Vision Screening Comments: Regular eye  exams, Dr. Ellin Mayhew  Dietary issues and exercise activities discussed: Current Exercise Habits: The patient does not participate in regular exercise at present  Goals    . Patient Stated     10/15/2020, wants to lose 15 pounds      Depression Screen PHQ 2/9 Scores 10/15/2020 07/16/2020 04/01/2020 03/11/2020 02/12/2020 09/22/2019 09/16/2018  PHQ - 2 Score 0 4 1 1 1  0 0  PHQ- 9 Score 4 11 9  - 14 - -    Fall Risk Fall Risk  10/15/2020 09/22/2019 03/17/2019 09/16/2018 03/07/2018  Falls in the past year? 1 1 0 No No  Comment tripped over dog - - - -  Number falls in past yr: 0 1 - - -  Injury with Fall? 0 0 - - -  Risk Factor Category  - - - - -  Risk for fall due to : Medication side effect Impaired balance/gait - - -  Follow up Falls evaluation completed;Education provided;Falls prevention discussed - Falls evaluation completed - -    Any stairs in or around the home? No  If so, are there any without handrails? n/a Home free of loose throw rugs in walkways, pet beds, electrical cords, etc? Yes  Adequate lighting in your home to reduce risk of falls? Yes   ASSISTIVE DEVICES UTILIZED TO PREVENT FALLS:  Life alert? No  Use of a cane, walker or w/c? No  Grab bars in the bathroom? No  Shower chair or bench in shower? No  Elevated toilet seat or a handicapped toilet? No   TIMED UP AND GO:  Was the Wang performed? No . .    Cognitive Function:     6CIT Screen 10/15/2020  What Year? 0 points  What month? 0 points  What time? 0 points  Count back from 20 0 points  Months in reverse 0 points  Repeat phrase 0 points  Total Score 0    Immunizations Immunization History  Administered Date(s) Administered  . Fluad Quad(high Dose 65+) 09/26/2019  . Influenza, High Dose Seasonal PF 09/16/2018  . Influenza,inj,Quad PF,6+ Mos 09/26/2016, 10/12/2017  . Influenza-Unspecified 10/20/2015, 10/12/2017  . PFIZER SARS-COV-2 Vaccination 02/13/2020, 03/09/2020  . Pneumococcal Conjugate-13  07/16/2020  . Td 01/01/2006  . Tdap 07/31/2016  . Zoster 07/10/2013    TDAP status: Up to date  Flu Vaccine status: Declined, Education has been provided regarding the importance of this vaccine but patient still declined. Advised may receive this vaccine at local pharmacy or Health Dept. Aware to provide a copy of the vaccination record if obtained from local pharmacy or Health Dept. Verbalized acceptance and understanding. Pneumococcal vaccine status: Up to date Covid-19 vaccine status: Completed vaccines  Qualifies for Shingles Vaccine? Yes   Zostavax completed Yes   Shingrix Completed?: No.    Education has been provided regarding the importance of this vaccine. Patient has been advised to call insurance company to determine out of pocket expense if they have not yet received this vaccine. Advised may also receive vaccine at local pharmacy or Health Dept. Verbalized acceptance and understanding.  Screening Tests Health Maintenance  Topic Date Due  . INFLUENZA VACCINE  07/18/2020  . PNA vac Low Risk Adult (2 of 2 - PPSV23) 07/16/2021  . MAMMOGRAM  08/21/2021  . TETANUS/TDAP  07/31/2026  . COLONOSCOPY  11/28/2028  . DEXA SCAN  Completed  . COVID-19 Vaccine  Completed  . Hepatitis C Screening  Completed    Health Maintenance  Health Maintenance Due  Topic Date Due  . INFLUENZA VACCINE  07/18/2020    Colorectal cancer screening: Completed 11/28/2018. Repeat every 10 years Mammogram status: patient to schedule Bone Density status: Completed 06/06/2011.  Lung Cancer Screening: (Low Dose CT Chest recommended if Age 63-80 years, 30 pack-year currently smoking OR have quit w/in 15years.) does not qualify.   Lung Cancer Screening Referral: no  Additional Screening:  Hepatitis C Screening: does qualify; Completed 07/31/2016  Vision Screening: Recommended annual ophthalmology exams for early detection of glaucoma and other disorders of the eye. Is the patient up to date with  their annual eye exam?  Yes  Who is the provider or what is the name of the office in which the patient attends annual eye exams? Dr. Ellin Mayhew If pt is not established with a provider, would they like to be referred to a provider to establish care? No .   Dental Screening: Recommended annual dental exams for proper oral hygiene  Community Resource Referral / Chronic Care Management: CRR required this visit?  No   CCM required this visit?  No      Plan:     I have personally reviewed and noted the following in the patient's chart:   . Medical and social history . Use of alcohol, tobacco or illicit drugs  . Current medications and supplements . Functional ability and status . Nutritional status . Physical activity . Advanced directives . List of other physicians . Hospitalizations, surgeries, and ER visits in previous 12 months . Vitals . Screenings to include cognitive, depression, and falls . Referrals and appointments  In addition, I have reviewed and discussed with patient certain preventive protocols, quality metrics, and best practice recommendations. A written personalized care plan for preventive services as well as general preventive health recommendations were provided to patient.     Kellie Simmering, LPN   27/05/2375   Nurse Notes:

## 2020-11-03 DIAGNOSIS — F411 Generalized anxiety disorder: Secondary | ICD-10-CM | POA: Diagnosis not present

## 2020-11-05 ENCOUNTER — Encounter: Payer: Self-pay | Admitting: Nurse Practitioner

## 2020-11-05 ENCOUNTER — Telehealth (INDEPENDENT_AMBULATORY_CARE_PROVIDER_SITE_OTHER): Payer: Medicare Other | Admitting: Nurse Practitioner

## 2020-11-05 DIAGNOSIS — L255 Unspecified contact dermatitis due to plants, except food: Secondary | ICD-10-CM | POA: Diagnosis not present

## 2020-11-05 MED ORDER — PREDNISONE 10 MG PO TABS: ORAL_TABLET | ORAL | 0 refills | Status: DC

## 2020-11-05 NOTE — Patient Instructions (Signed)
Poison Ivy Dermatitis Poison ivy dermatitis is redness and soreness of the skin caused by chemicals in the leaves of the poison ivy plant. You may have very bad itching, swelling, a rash, and blisters. What are the causes?  Touching a poison ivy plant.  Touching something that has the chemical on it. This may include animals or objects that have come in contact with the plant. What increases the risk?  Going outdoors often in wooded or marshy areas.  Going outdoors without wearing protective clothing, such as closed shoes, long pants, and a long-sleeved shirt. What are the signs or symptoms?   Skin redness.  Very bad itching.  A rash that often includes bumps and blisters. ? The rash usually appears 48 hours after exposure, if you have been exposed before. ? If this is the first time you have been exposed, the rash may not appear until a week after exposure.  Swelling. This may occur if the reaction is very bad. Symptoms usually last for 1-2 weeks. The first time you develop this condition, symptoms may last 3-4 weeks. How is this treated? This condition may be treated with:  Hydrocortisone cream or calamine lotion to relieve itching.  Oatmeal baths to soothe the skin.  Medicines, such as over-the-counter antihistamine tablets.  Oral steroid medicine for more severe reactions. Follow these instructions at home: Medicines  Take or apply over-the-counter and prescription medicines only as told by your doctor.  Use hydrocortisone cream or calamine lotion as needed to help with itching. General instructions  Do not scratch or rub your skin.  Put a cold, wet cloth (cold compress) on the affected areas or take baths in cool water. This will help with itching.  Avoid hot baths and showers.  Take oatmeal baths as needed. Use colloidal oatmeal. You can get this at a pharmacy or grocery store. Follow the instructions on the package.  While you have the rash, wash your clothes  right after you wear them.  Keep all follow-up visits as told by your health care provider. This is important. How is this prevented?   Know what poison ivy looks like, so you can avoid it. ? This plant has three leaves with flowering branches on a single stem. ? The leaves are glossy. ? The leaves have uneven edges that come to a point at the front.  If you touch poison ivy, wash your skin with soap and water right away. Be sure to wash under your fingernails.  When hiking or camping, wear long pants, a long-sleeved shirt, tall socks, and hiking boots. You can also use a lotion on your skin that helps to prevent contact with poison ivy.  If you think that your clothes or outdoor gear came in contact with poison ivy, rinse them off with a garden hose before you bring them inside your house.  When doing yard work or gardening, wear gloves, long sleeves, long pants, and boots. Wash your garden tools and gloves if they come in contact with poison ivy.  If you think that your pet has come into contact with poison ivy, wash him or her with pet shampoo and water. Make sure to wear gloves while washing your pet. Contact a doctor if:  You have open sores in the rash area.  You have more redness, swelling, or pain in the rash area.  You have redness that spreads beyond the rash area.  You have fluid, blood, or pus coming from the rash area.  You have a   fever.  You have a rash over a large area of your body.  You have a rash on your eyes, mouth, or genitals.  Your rash does not get better after a few weeks. Get help right away if:  Your face swells or your eyes swell shut.  You have trouble breathing.  You have trouble swallowing. These symptoms may be an emergency. Do not wait to see if the symptoms will go away. Get medical help right away. Call your local emergency services (911 in the [place] Do not drive yourself to the hospital. Summary  Poison ivy dermatitis is redness and  soreness of the skin caused by chemicals in the leaves of the poison ivy plant.  You may have skin redness, very bad itching, swelling, and a rash.  Do not scratch or rub your skin.  Take or apply over-the-counter and prescription medicines only as told by your doctor. This information is not intended to replace advice given to you by your health care provider. Make sure you discuss any questions you have with your health care provider. Document Revised: 03/28/2019 Document Reviewed: 11/29/2018 Elsevier Patient Education  2020 [employer]  

## 2020-11-05 NOTE — Assessment & Plan Note (Signed)
Acute, ongoing.  Will treat with 14-day prednisone taper.  If symptoms do not improve, return to clinic.

## 2020-11-05 NOTE — Progress Notes (Signed)
There were no vitals taken for this visit.   Subjective:    Patient ID: Amanda Wang, female    DOB: Jun 24, 1951, 69 y.o.   MRN: [id number]  HPI: Amanda Wang is a 69 y.o. female presenting with rash.  Chief Complaint  Patient presents with   Amanda Wang    pt states she came into contact with some Amanda ivy on 6 days ago. States she is allergic to this. States she has been taking benadryl and using cortisone for this.    RASH Reports she got in some Amanda ivy about 1 week ago Duration:  weeks  Location: butt cheeks, legs, arms, back  Itching: yes Burning: no Redness: yes Oozing: no Scaling: yes Blisters: yes Painful: no Fevers: no Change in detergents/soaps/personal care products: no Recent illness: no Recent travel:no History of same: yes Context: worse Alleviating factors: hydrocortisone cream and benadryl Treatments attempted:hydrocortisone cream and benadryl Shortness of breath: no  Throat/tongue swelling: no Myalgias/arthralgias: no  Allergies  Allergen Reactions   Fexofenadine Anxiety    Makes her have a panic attack   Codeine    Methylphenidate Hcl     Other reaction(s): Other (See Comments) "Caused a seizure"   Zoloft [Sertraline]    Magnevist [Gadopentetate] Other (See Comments)    IV Contrast: Other Reaction: hives/itching   Sulfa Antibiotics Rash    Other reaction(s): Other (See Comments)   Outpatient Encounter Medications as of 11/05/2020  Medication Sig   amLODipine (NORVASC) 5 MG tablet Take 1 tablet (5 mg total) by mouth daily.   atorvastatin (LIPITOR) 40 MG tablet Take 1 tablet (40 mg total) by mouth daily at 6 PM.   benazepril (LOTENSIN) 20 MG tablet Take 1 tablet (20 mg total) by mouth daily.   cyclobenzaprine (FLEXERIL) 10 MG tablet take 1 tablet by mouth three times a day if needed for muscle spasm   gabapentin (NEURONTIN) 300 MG capsule Take 1 capsule (300 mg total) by mouth 2 (two) times daily as needed (leg  cramps).   hydrochlorothiazide (HYDRODIURIL) 25 MG tablet Take 1 tablet (25 mg total) by mouth daily.   levETIRAcetam (KEPPRA) 500 MG tablet Take 1 tablet (500 mg total) by mouth daily.   LORazepam (ATIVAN) 1 MG tablet Take 0.5-1 tablets (0.5-1 mg total) by mouth daily as needed for anxiety.   montelukast (SINGULAIR) 10 MG tablet Take 1 tablet (10 mg total) by mouth at bedtime.   rifaximin (XIFAXAN) 550 MG TABS tablet Take 1 tablet by mouth daily as needed.    vortioxetine HBr (TRINTELLIX) 5 MG TABS tablet Take 1 tablet (5 mg total) by mouth daily.   predniSONE (DELTASONE) 10 MG tablet Take 6 tablets by mouth daily for 2 days, then reduce by 1 tablet every 2 days until gone   No facility-administered encounter medications on file as of 11/05/2020.   Patient Active Problem List   Diagnosis Date Noted   Dermatitis due to plants, including Amanda ivy, sumac, and oak 11/05/2020   Prediabetes 07/22/2020   Allergic rhinitis due to allergen 07/16/2020   Fatigue 08/06/2019   Irritable bowel syndrome with diarrhea    Benign neoplasm of transverse colon    Diarrhea 10/07/2018   GERD (gastroesophageal reflux disease) 09/16/2018   Personal history of surgery to heart and great vessels, presenting hazards to health 09/16/2018   Personal history of digestive disease 09/16/2018   Hiatal hernia 07/29/2018   Restless legs 03/07/2018   Hypertension 10/26/2017   Hyperlipidemia 10/26/2017  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   Anxiety 08/07/2017   Status post bariatric surgery 02/01/2017   Depression, recurrent [facility])    Past Medical History:  Diagnosis Date   Advanced care planning/counseling discussion 09/16/2018   Arthritis    Chiari malformation type I ([place])    Depression    GERD (gastroesophageal reflux disease)    History of hiatal hernia    Hyperlipidemia    Hypertension    Sleep apnea    Small intestinal bacterial overgrowth    Spinal  stenosis    Vaccine reaction, initial encounter 03/11/2020   Relevant past medical, surgical, family and social history reviewed and updated as indicated. Interim medical history since our last visit reviewed.  Review of Systems  HENT: Negative.  Negative for trouble swallowing.   Respiratory: Negative.  Negative for shortness of breath and wheezing.   Genitourinary: Negative.   Musculoskeletal: Negative.  Negative for arthralgias and myalgias.  Skin: Positive for rash.  Neurological: Negative.   Psychiatric/Behavioral: Negative.     Per HPI unless specifically indicated above     Objective:    There were no vitals taken for this visit.  Wt Readings from Last 3 Encounters:  10/15/20 164 lb (74.4 kg)  07/16/20 161 lb (73 kg)  04/01/20 161 lb (73 kg)    Physical Exam Vitals and nursing note reviewed.  Constitutional:      General: She is not in acute distress.    Appearance: Normal appearance. She is not toxic-appearing.  HENT:     Head: Normocephalic and atraumatic.  Eyes:     General: No scleral icterus.    Extraocular Movements: Extraocular movements intact.  Pulmonary:     Effort: Pulmonary effort is normal. No respiratory distress.     Comments: Unable to assess lung sounds via virtual visit.  Patient talking in complete sentences.  No accessory muscle use. Musculoskeletal:        General: Normal range of motion.  Skin:    Coloration: Skin is not jaundiced or pale.     Findings: Rash (Erythematous maculopapular rash with blistering noted to both upper extremities consistent with contact dermatitis due to plant like Amanda ivy or Amanda sumac) present.  Neurological:     Mental Status: She is alert and oriented to person, place, and time.  Psychiatric:        Mood and Affect: Mood normal.        Behavior: Behavior normal.        Thought Content: Thought content normal.        Judgment: Judgment normal.        Assessment & Plan:   Problem List Items Addressed  This Visit      Musculoskeletal and Integument   Dermatitis due to plants, including Amanda ivy, sumac, and oak - Primary    Acute, ongoing.  Will treat with 14-day prednisone taper.  If symptoms do not improve, return to clinic.          Follow up plan: Return in about 2 months (around 01/05/2021) for chronic disease f/u.  Due to the catastrophic nature of the COVID-19 pandemic, this visit was completed via audio and visual contact via [employer] due to the restrictions of the COVID-19 pandemic. All issues as above were discussed and addressed. Physical exam was done as above through visual confirmation on [employer]. If it was felt that the patient should be evaluated in the office, they were directed there. The patient verbally consented to this  visit."}  Location of the patient: home  Location of the provider: work  Those involved with this call:   Provider: Carnella Guadalajara, DNP  CMA: Yvonna Alanis, [place] [facility]/Registration: Jill Side   Time spent on call: 15 minutes with patient face to face via video conference. More than 50% of this time was spent in counseling and coordination of care. 20 minutes total spent in review of patient's record and preparation of their chart.  I verified patient identity using two factors (patient name and date of birth). Patient consents verbally to being seen via telemedicine visit today.

## 2020-11-06 ENCOUNTER — Encounter: Payer: Self-pay | Admitting: Nurse Practitioner

## 2020-11-09 ENCOUNTER — Encounter: Payer: Self-pay | Admitting: Family Medicine

## 2020-11-09 ENCOUNTER — Other Ambulatory Visit: Payer: Self-pay

## 2020-11-09 ENCOUNTER — Ambulatory Visit (INDEPENDENT_AMBULATORY_CARE_PROVIDER_SITE_OTHER): Payer: Medicare Other | Admitting: Family Medicine

## 2020-11-09 DIAGNOSIS — R21 Rash and other nonspecific skin eruption: Secondary | ICD-10-CM | POA: Diagnosis not present

## 2020-11-09 MED ORDER — VALACYCLOVIR HCL 1 G PO TABS
1000.0000 mg | ORAL_TABLET | Freq: Every day | ORAL | 0 refills | Status: AC
Start: 2020-11-09 — End: 2020-11-12

## 2020-11-09 NOTE — Assessment & Plan Note (Signed)
Most likely early shingles given symptomatology and improvement with valtrex though no blisters seen on exam today, likely due to valtrex started within 24 hours of symptoms. No findings to support acute infection, a tick borne illness, drug reaction. Continue valtrex for 3 additional days and finish prednisone taper, recommend returning for flu and shingles vaccine after completion. Return precautions discussed.

## 2020-11-09 NOTE — Progress Notes (Signed)
   SUBJECTIVE:   CHIEF COMPLAINT / HPI:   RASH - Seen previously virtually 11/19 for poison ivy, gave 14 day prednisone taper. Few days after had slight red rash with pain to R upper chest and back. - Daughter said looked like shingles. Spoke to dentist who recommended taking valtrex (takes for cold sores). Better since starting. - Had zostavax in 2014. Has not had shingrix. Duration:  4 days  Itching: no Burning: no Redness: no Oozing: no Scaling: no Blisters: no Painful: yes, better since starting valtrex Fevers: no Change in detergents/soaps/personal care products: no Recent illness: no Recent travel:no History of same: no Context: better Alleviating factors: valtrex Treatments attempted:valtrex, prednisone Shortness of breath: no  Throat/tongue swelling: no Myalgias/arthralgias: no   PERTINENT  PMH / PSH: HTN, allergic rhinitis, GERD, IBS, Chiari malformation type I, depression/anxiety, HLD, hiatal hernia, prediabetes, RLS, h/o bariartic surgery  OBJECTIVE:   BP 126/78   Pulse 90   Temp 98.9 F (37.2 C) (Oral)   Wt 168 lb 6.4 oz (76.4 kg)   SpO2 98%   BMI 30.80 kg/m   Gen: well appearing, in NAD Skin: Very small area of erythema, slightly linear along T4 dermatome. No active blisters, pustules, crusting.  ASSESSMENT/PLAN:   Rash Most likely early shingles given symptomatology and improvement with valtrex though no blisters seen on exam today, likely due to valtrex started within 24 hours of symptoms. No findings to support acute infection, a tick borne illness, drug reaction. Continue valtrex for 3 additional days and finish prednisone taper, recommend returning for flu and shingles vaccine after completion. Return precautions discussed.    Myles Gip, DO

## 2020-11-09 NOTE — Patient Instructions (Signed)
It was great to see you!  Our plans for today:  - Take the valtrex for 3 more days. - Finish your prednisone course.  - Let us know if your rash gets worse.  - Come back or go to your pharmacy once you finish your prednisone course to get your shingles and flu vaccines.  Take care and seek immediate care sooner if you develop any concerns.   Dr. Ky Barban   Shingles  Shingles, which is also known as herpes zoster, is an infection that causes a painful skin rash and fluid-filled blisters. It is caused by a virus. Shingles only develops in people who:  Have had chickenpox.  Have been given a medicine to protect against chickenpox (have been vaccinated). Shingles is rare in this group. What are the causes? Shingles is caused by varicella-zoster virus (VZV). This is the same virus that causes chickenpox. After a person is exposed to VZV, the virus stays in the body in an inactive (dormant) state. Shingles develops if the virus is reactivated. This can happen many years after the first (initial) exposure to VZV. It is not known what causes this virus to be reactivated. What increases the risk? People who have had chickenpox or received the chickenpox vaccine are at risk for shingles. Shingles infection is more common in people who:  Are older than age 36.  Have a weakened disease-fighting system (immune system), such as people with: ? HIV. ? AIDS. ? Cancer.  Are taking medicines that weaken the immune system, such as transplant medicines.  Are experiencing a lot of stress. What are the signs or symptoms? Early symptoms of this condition include itching, tingling, and pain in an area on your skin. Pain may be described as burning, stabbing, or throbbing. A few days or weeks after early symptoms start, a painful red rash appears. The rash is usually on one side of the body and has a band-like or belt-like pattern. The rash eventually turns into fluid-filled blisters that break open,  change into scabs, and dry up in about 2-3 weeks. At any time during the infection, you may also develop:  A fever.  Chills.  A headache.  An upset stomach. How is this diagnosed? This condition is diagnosed with a skin exam. Skin or fluid samples may be taken from the blisters before a diagnosis is made. These samples are examined under a microscope or sent to a lab for testing. How is this treated? The rash may last for several weeks. There is not a specific cure for this condition. Your health care provider will probably prescribe medicines to help you manage pain, recover more quickly, and avoid long-term problems. Medicines may include:  Antiviral drugs.  Anti-inflammatory drugs.  Pain medicines.  Anti-itching medicines (antihistamines). If the area involved is on your face, you may be referred to a specialist, such as an eye doctor (ophthalmologist) or an ear, nose, and throat (ENT) doctor (otolaryngologist) to help you avoid eye problems, chronic pain, or disability. Follow these instructions at home: Medicines  Take over-the-counter and prescription medicines only as told by your health care provider.  Apply an anti-itch cream or numbing cream to the affected area as told by your health care provider. Relieving itching and discomfort   Apply cold, wet cloths (cold compresses) to the area of the rash or blisters as told by your health care provider.  Cool baths can be soothing. Try adding baking soda or dry oatmeal to the water to reduce itching. Do not  bathe in hot water. Blister and rash care  Keep your rash covered with a loose bandage (dressing). Wear loose-fitting clothing to help ease the pain of material rubbing against the rash.  Keep your rash and blisters clean by washing the area with mild soap and cool water as told by your health care provider.  Check your rash every day for signs of infection. Check for: ? More redness, swelling, or pain. ? Fluid or  blood. ? Warmth. ? Pus or a bad smell.  Do not scratch your rash or pick at your blisters. To help avoid scratching: ? Keep your fingernails clean and cut short. ? Wear gloves or mittens while you sleep, if scratching is a problem. General instructions  Rest as told by your health care provider.  Keep all follow-up visits as told by your health care provider. This is important.  Wash your hands often with soap and water. If soap and water are not available, use hand sanitizer. Doing this lowers your chance of getting a bacterial skin infection.  Before your blisters change into scabs, your shingles infection can cause chickenpox in people who have never had it or have never been vaccinated against it. To prevent this from happening, avoid contact with other people, especially: ? Babies. ? Pregnant women. ? Children who have eczema. ? Elderly people who have transplants. ? People who have chronic illnesses, such as cancer or AIDS. Contact a health care provider if:  Your pain is not relieved with prescribed medicines.  Your pain does not get better after the rash heals.  You have signs of infection in the rash area, such as: ? More redness, swelling, or pain around the rash. ? Fluid or blood coming from the rash. ? The rash area feeling warm to the touch. ? Pus or a bad smell coming from the rash. Get help right away if:  The rash is on your face or nose.  You have facial pain, pain around your eye area, or loss of feeling on one side of your face.  You have difficulty seeing.  You have ear pain or have ringing in your ear.  You have a loss of taste.  Your condition gets worse. Summary  Shingles, which is also known as herpes zoster, is an infection that causes a painful skin rash and fluid-filled blisters.  This condition is diagnosed with a skin exam. Skin or fluid samples may be taken from the blisters and examined before the diagnosis is made.  Keep your rash  covered with a loose bandage (dressing). Wear loose-fitting clothing to help ease the pain of material rubbing against the rash.  Before your blisters change into scabs, your shingles infection can cause chickenpox in people who have never had it or have never been vaccinated against it. This information is not intended to replace advice given to you by your health care provider. Make sure you discuss any questions you have with your health care provider. Document Revised: 03/28/2019 Document Reviewed: 08/08/2017 Elsevier Patient Education  2020 [employer].

## 2020-11-15 ENCOUNTER — Other Ambulatory Visit: Payer: Self-pay

## 2020-11-15 DIAGNOSIS — I10 Essential (primary) hypertension: Secondary | ICD-10-CM

## 2020-11-15 MED ORDER — HYDROCHLOROTHIAZIDE 25 MG PO TABS: 25.0000 mg | ORAL_TABLET | Freq: Every day | ORAL | 0 refills | Status: DC

## 2020-11-16 ENCOUNTER — Other Ambulatory Visit: Payer: Self-pay

## 2020-11-16 DIAGNOSIS — G2581 Restless legs syndrome: Secondary | ICD-10-CM

## 2020-11-16 MED ORDER — GABAPENTIN 300 MG PO CAPS: 300.0000 mg | ORAL_CAPSULE | Freq: Two times a day (BID) | ORAL | 1 refills | Status: DC | PRN

## 2020-11-16 NOTE — Telephone Encounter (Signed)
Request from pharmacy- different than in chart   1 300mg  tablet TID

## 2020-11-18 DIAGNOSIS — F411 Generalized anxiety disorder: Secondary | ICD-10-CM | POA: Diagnosis not present

## 2020-12-02 ENCOUNTER — Other Ambulatory Visit: Payer: Self-pay

## 2020-12-02 DIAGNOSIS — G935 Compression of brain: Secondary | ICD-10-CM

## 2020-12-02 MED ORDER — LEVETIRACETAM 500 MG PO TABS: 500.0000 mg | ORAL_TABLET | Freq: Every day | ORAL | 1 refills | Status: DC

## 2020-12-31 ENCOUNTER — Other Ambulatory Visit: Payer: Self-pay | Admitting: Nurse Practitioner

## 2020-12-31 DIAGNOSIS — G935 Compression of brain: Secondary | ICD-10-CM

## 2021-01-07 ENCOUNTER — Encounter: Payer: Self-pay | Admitting: Nurse Practitioner

## 2021-01-07 DIAGNOSIS — R7989 Other specified abnormal findings of blood chemistry: Secondary | ICD-10-CM | POA: Insufficient documentation

## 2021-01-11 ENCOUNTER — Encounter: Payer: Self-pay | Admitting: Nurse Practitioner

## 2021-01-11 ENCOUNTER — Other Ambulatory Visit: Payer: Self-pay

## 2021-01-11 ENCOUNTER — Ambulatory Visit (INDEPENDENT_AMBULATORY_CARE_PROVIDER_SITE_OTHER): Payer: Medicare PPO | Admitting: Nurse Practitioner

## 2021-01-11 VITALS — BP 109/74 | HR 90 | Temp 98.6°F | Ht 61.14 in | Wt 169.4 lb

## 2021-01-11 DIAGNOSIS — E7800 Pure hypercholesterolemia, unspecified: Secondary | ICD-10-CM

## 2021-01-11 DIAGNOSIS — R7303 Prediabetes: Secondary | ICD-10-CM

## 2021-01-11 DIAGNOSIS — E538 Deficiency of other specified B group vitamins: Secondary | ICD-10-CM

## 2021-01-11 DIAGNOSIS — G935 Compression of brain: Secondary | ICD-10-CM | POA: Diagnosis not present

## 2021-01-11 DIAGNOSIS — I10 Essential (primary) hypertension: Secondary | ICD-10-CM

## 2021-01-11 DIAGNOSIS — F339 Major depressive disorder, recurrent, unspecified: Secondary | ICD-10-CM | POA: Diagnosis not present

## 2021-01-11 DIAGNOSIS — G2581 Restless legs syndrome: Secondary | ICD-10-CM

## 2021-01-11 DIAGNOSIS — R7989 Other specified abnormal findings of blood chemistry: Secondary | ICD-10-CM

## 2021-01-11 DIAGNOSIS — F419 Anxiety disorder, unspecified: Secondary | ICD-10-CM

## 2021-01-11 DIAGNOSIS — Z79899 Other long term (current) drug therapy: Secondary | ICD-10-CM | POA: Insufficient documentation

## 2021-01-11 LAB — MICROALBUMIN, URINE WAIVED
Creatinine, Urine Waived: 50 mg/dL (ref 10–300)
Microalb, Ur Waived: 10 mg/L (ref 0–19)
Microalb/Creat Ratio: <30 mg/g (ref <30)

## 2021-01-11 MED ORDER — VIIBRYD 20 MG PO TABS: 20.0000 mg | ORAL_TABLET | Freq: Every day | ORAL | 4 refills | Status: DC

## 2021-01-11 NOTE — Assessment & Plan Note (Signed)
Chronic, ongoing with BP at goal in office today.  Recommend she monitor BP at least a few mornings a week at home and document for office visits.  DASH diet at home.  Continue current medication regimen and adjust as needed.  Labs today: BMP and TSH.  Return in 6 months.

## 2021-01-11 NOTE — Assessment & Plan Note (Signed)
Noted on past labs, denies any symptoms today, other then baseline fatigue.  Check TSH and Free T4 today.  Initiate medication as needed.

## 2021-01-11 NOTE — Progress Notes (Signed)
BP 109/74   Pulse 90   Temp 98.6 F (37 C) (Oral)   Ht 5' 1.14" (1.553 m)   Wt 169 lb 6.4 oz (76.8 kg)   SpO2 97%   BMI 31.86 kg/m    Subjective:    Patient ID: Amanda Wang, female    DOB: [birth year], 70 y.o.   MRN: [id number]  HPI: Amanda Wang is a 70 y.o. female  Chief Complaint  Patient presents with  . Anxiety  . Hypertension   HYPERTENSION / HYPERLIPIDEMIA Continues on Amlodipine, Atorvastatin, Benazepril.  Did have A1c July 2021 noting 6.3% and K+ 3.1.  Last TSH was 4.770 in July.  History of gastric sleeve years ago, followed by GI. Satisfied with current treatment? yes Duration of hypertension: chronic BP monitoring frequency: not checking BP range:  BP medication side effects: no Duration of hyperlipidemia: chronic Cholesterol medication side effects: no Cholesterol supplements: none Medication compliance: good compliance Aspirin: no Recent stressors: no Recurrent headaches: no Visual changes: no Palpitations: no Dyspnea: no Chest pain: no Lower extremity edema: no Dizzy/lightheaded: no   ANXIETY/STRESS Continues on Ativan.  Pt is aware of risks of benzo medication use to include increased sedation, respiratory suppression, falls, dependence and cardiovascular events.  Pt would like to continue treatment as benefit determined to outweigh risk.  She is followed by Dr. Mel Almond with psychology 11/03/20 for therapy. In past had taken Ritalin which caused seizure like activity -- was placed on Keppra, which she was maintained on as this controls her baseline muscle twitches -- Chiari malformation type I.  On review PDMP last fill 08/19/20 of Ativan #30 tablets. She is also taking Gabapentin -- for sleep and RLS, Trintellix -- for panic attacks -- states Viibryd worked better then [employer], but insurance would not cover, now insurance is new and would like to try to restart this again and stop Trintellix -- as more benefit. Duration:stable Anxious mood:  none Excessive worrying: no Irritability: no  Sweating: no Nausea: no Palpitations:no Hyperventilation: no Panic attacks: none recently Obscessions/compulsions: yes Depressed mood: no Depression screen [facility] 2/9 01/11/2021 10/15/2020 07/16/2020 04/01/2020 03/11/2020  Decreased Interest 0 0 2 0 0  Down, Depressed, Hopeless 1 0 2 1 1   PHQ - 2 Score 1 0 4 1 1   Altered sleeping 2 0 3 3 -  Tired, decreased energy 3 1 2 2  -  Change in appetite 3 0 1 1 -  Feeling bad or failure about yourself  0 0 1 2 -  Trouble concentrating 3 3 0 0 -  Moving slowly or fidgety/restless 0 0 0 0 -  Suicidal thoughts 0 0 0 0 -  PHQ-9 Score 12 4 11 9  -  Difficult doing work/chores - Not difficult at all Somewhat difficult Not difficult at all -   Weight changes: no Insomnia: yes hard to fall asleep  Hypersomnia: no Fatigue/loss of energy: occasional Feelings of worthlessness: no Feelings of guilt: no Impaired concentration/indecisiveness: yes Suicidal ideations: no  Crying spells: no Recent Stressors/Life Changes: yes   Relationship problems: no   Family stress: yes     Financial stress: no    Job stress: no    Recent death/loss: no GAD 7 : Generalized Anxiety Score 01/11/2021 04/01/2020 02/12/2020  Nervous, Anxious, on Edge 1 1 0  Control/stop worrying 0 1 2  Worry too much - different things 1 3 3   Trouble relaxing 0 1 1  Restless 3 1 2   Easily annoyed or irritable 0  1 1  Afraid - awful might happen 1 1 1   Total GAD 7 Score 6 9 10   Anxiety Difficulty - Not difficult at all Not difficult at all   Relevant past medical, surgical, family and social history reviewed and updated as indicated. Interim medical history since our last visit reviewed. Allergies and medications reviewed and updated.  Review of Systems  Constitutional: Negative for activity change, appetite change, diaphoresis, fatigue and fever.  Respiratory: Negative for cough, chest tightness and shortness of breath.   Cardiovascular:  Negative for chest pain, palpitations and leg swelling.  Gastrointestinal: Negative.   Endocrine: Negative.   Neurological: Negative.   Psychiatric/Behavioral: Positive for sleep disturbance. Negative for decreased concentration, self-injury and suicidal ideas. The patient is nervous/anxious.     Per HPI unless specifically indicated above     Objective:    BP 109/74   Pulse 90   Temp 98.6 F (37 C) (Oral)   Ht 5' 1.14" (1.553 m)   Wt 169 lb 6.4 oz (76.8 kg)   SpO2 97%   BMI 31.86 kg/m   Wt Readings from Last 3 Encounters:  01/11/21 169 lb 6.4 oz (76.8 kg)  11/09/20 168 lb 6.4 oz (76.4 kg)  10/15/20 164 lb (74.4 kg)    Physical Exam Vitals and nursing note reviewed.  Constitutional:      General: She is awake. She is not in acute distress.    Appearance: She is well-developed and well-groomed. She is obese. She is not ill-appearing.  HENT:     Head: Normocephalic.     Right Ear: Hearing normal.     Left Ear: Hearing normal.  Eyes:     General: Lids are normal.        Right eye: No discharge.        Left eye: No discharge.     Conjunctiva/sclera: Conjunctivae normal.     Pupils: Pupils are equal, round, and reactive to light.  Neck:     Thyroid: No thyromegaly.     Vascular: No carotid bruit.  Cardiovascular:     Rate and Rhythm: Normal rate and regular rhythm.     Heart sounds: Normal heart sounds. No murmur heard. No gallop.   Pulmonary:     Effort: Pulmonary effort is normal. No accessory muscle usage or respiratory distress.     Breath sounds: Normal breath sounds.  Abdominal:     General: Bowel sounds are normal.     Palpations: Abdomen is soft.  Musculoskeletal:     Cervical back: Normal range of motion and neck supple.     Right lower leg: No edema.     Left lower leg: No edema.  Lymphadenopathy:     Cervical: No cervical adenopathy.  Skin:    General: Skin is warm and dry.  Neurological:     Mental Status: She is alert and oriented to person,  place, and time.  Psychiatric:        Attention and Perception: Attention normal.        Mood and Affect: Mood normal.        Speech: Speech normal.        Behavior: Behavior normal. Behavior is cooperative.        Thought Content: Thought content normal.        Judgment: Judgment normal.     Results for orders placed or performed in visit on 07/16/20  Lipid Panel w/o Chol/HDL Ratio  Result Value Ref Range   Cholesterol, Total  176 100 - 199 mg/dL   Triglycerides 148 0 - 149 mg/dL   HDL 63 >39 mg/dL   VLDL Cholesterol Cal 25 5 - 40 mg/dL   LDL Chol Calc (NIH) 88 0 - 99 mg/dL  TSH  Result Value Ref Range   TSH 5.020 (H) 0.450 - 4.500 uIU/mL  Comprehensive metabolic panel  Result Value Ref Range   Glucose 148 (H) 65 - 99 mg/dL   BUN 12 8 - 27 mg/dL   Creatinine, Ser 0.76 0.57 - 1.00 mg/dL   GFR calc non Af Amer 81 >59 mL/min/1.73   GFR calc Af Amer 94 >59 mL/min/1.73   BUN/Creatinine Ratio 16 12 - 28   Sodium 140 134 - 144 mmol/L   Potassium 3.1 (L) 3.5 - 5.2 mmol/L   Chloride 97 96 - 106 mmol/L   CO2 28 20 - 29 mmol/L   Calcium 9.9 8.7 - 10.3 mg/dL   Total Protein 7.7 6.0 - 8.5 g/dL   Albumin 4.7 3.8 - 4.8 g/dL   Globulin, Total 3.0 1.5 - 4.5 g/dL   Albumin/Globulin Ratio 1.6 1.2 - 2.2   Bilirubin Total 0.4 0.0 - 1.2 mg/dL   Alkaline Phosphatase 145 (H) 48 - 121 IU/L   AST 12 0 - 40 IU/L   ALT 13 0 - 32 IU/L  CBC with Differential/Platelet  Result Value Ref Range   WBC 10.2 3.4 - 10.8 x10E3/uL   RBC 4.82 3.77 - 5.28 x10E6/uL   Hemoglobin 12.1 11.1 - 15.9 g/dL   Hematocrit 39.5 34.0 - 46.6 %   MCV 82 79 - 97 fL   MCH 25.1 (L) 26.6 - 33.0 pg   MCHC 30.6 (L) 31.5 - 35.7 g/dL   RDW 14.8 11.7 - 15.4 %   Platelets 415 150 - 450 x10E3/uL   Neutrophils 77 Not Estab. %   Lymphs 14 Not Estab. %   Monocytes 7 Not Estab. %   Eos 1 Not Estab. %   Basos 1 Not Estab. %   Neutrophils Absolute 7.8 (H) 1.4 - 7.0 x10E3/uL   Lymphocytes Absolute 1.5 0.7 - 3.1 x10E3/uL    Monocytes Absolute 0.7 0.1 - 0.9 x10E3/uL   EOS (ABSOLUTE) 0.1 0.0 - 0.4 x10E3/uL   Basophils Absolute 0.1 0.0 - 0.2 x10E3/uL   Immature Granulocytes 0 Not Estab. %   Immature Grans (Abs) 0.0 0.0 - 0.1 x10E3/uL  HgB A1c  Result Value Ref Range   Hgb A1c MFr Bld 6.3 (H) 4.8 - 5.6 %   Est. average glucose Bld gHb Est-mCnc 134 mg/dL  Thyroid Panel With TSH  Result Value Ref Range   TSH 4.770 (H) 0.450 - 4.500 uIU/mL   T4, Total 7.8 4.5 - 12.0 ug/dL   T3 Uptake Ratio 24 24 - 39 %   Free Thyroxine Index 1.9 1.2 - 4.9  Specimen status report  Result Value Ref Range   specimen status report Comment       Assessment & Plan:   Problem List Items Addressed This Visit      Cardiovascular and Mediastinum   Hypertension    Chronic, ongoing with BP at goal in office today.  Recommend she monitor BP at least a few mornings a week at home and document for office visits.  DASH diet at home.  Continue current medication regimen and adjust as needed.  Labs today: BMP and TSH.  Return in 6 months.       Relevant Orders   Basic metabolic panel  Microalbumin, Urine Waived     Nervous and Auditory   Chiari malformation type I ([place])    With history of surgery and seizure like activity with Ritalin; continue Keppra 500 MG daily and monitor kidney function closely.  Return to office in 6 months to meet new PCP.        Other   Hyperlipidemia    Chronic, ongoing.  Continue current medication regimen and adjust as needed.  Lipid panel today.         Relevant Orders   Lipid Panel w/o Chol/HDL Ratio   Depression, recurrent (HCC) - Primary    Chronic, ongoing.  Denies SI/HI.  Minimally uses benzo with 30 tablets lasting 6 months most often.  Will send in refills today.  Discussed at length risks with medication and long term use, she wishes to continue at this time.  UDS today, obtain controlled substance contract next visit.  Will change from Trintellix, back to [place] which offered more benefit  in past, insurance now covers she reports == Stop Trintellix.  Scripts sent.  Return in 6 months.      Relevant Medications   Vilazodone HCl (VIIBRYD) 20 MG TABS   Anxiety    Chronic, ongoing with long term benzo use.  Minimally uses this with 30 tablets lasting 6 months most often.  Will send in refills today.  Discussed at length risks with medication and long term use, she wishes to continue at this time.  UDS today, obtain controlled substance contract next visit.  Will change from Trintellix, back to [place] which offered more benefit in past, insurance now covers she reports.  Scripts sent.  Denies SI/HI.  Return in 6 months.      Relevant Medications   Vilazodone HCl (VIIBRYD) 20 MG TABS   Restless legs    Chronic, ongoing.  Continue Gabapentin at this time which offers her benefit for rest and discomfort.  Monitor kidney function closely and adjust as needed.  B12 level today, has history of GI surgery.      Prediabetes    Noted on labs in July 2021, recheck A1c today and initiate medication as needed.  Continue focus on diet and exercise.      Relevant Orders   Microalbumin, Urine Waived   HgB A1c   Elevated TSH    Noted on past labs, denies any symptoms today, other then baseline fatigue.  Check TSH and Free T4 today.  Initiate medication as needed.      Relevant Orders   TSH   T4, free   Long term prescription benzodiazepine use    Long term use, uses appropriately and is fully aware of risks of long term use and medication. Obtain UDS today, controlled substance contract next visit.      Relevant Orders   [id number] 11+Oxyco+Alc+Crt-Bund    Other Visit Diagnoses    B12 deficiency       Reports history of low levels, check today and start supplement as needed.   Relevant Orders   Vitamin B12       Follow up plan: Return in about 6 months (around 07/11/2021) for HTN/HLD, MOOD -- meet new PCP.

## 2021-01-11 NOTE — Assessment & Plan Note (Signed)
Chronic, ongoing.  Continue current medication regimen and adjust as needed. Lipid panel today. 

## 2021-01-11 NOTE — Assessment & Plan Note (Signed)
Noted on labs in July 2021, recheck A1c today and initiate medication as needed.  Continue focus on diet and exercise.

## 2021-01-11 NOTE — Patient Instructions (Signed)

## 2021-01-11 NOTE — Assessment & Plan Note (Signed)
Long term use, uses appropriately and is fully aware of risks of long term use and medication. Obtain UDS today, controlled substance contract next visit.

## 2021-01-11 NOTE — Assessment & Plan Note (Signed)
With history of surgery and seizure like activity with Ritalin; continue Keppra 500 MG daily and monitor kidney function closely.  Return to office in 6 months to meet new PCP.

## 2021-01-11 NOTE — Assessment & Plan Note (Addendum)
Chronic, ongoing.  Denies SI/HI.  Minimally uses benzo with 30 tablets lasting 6 months most often.  Will send in refills today.  Discussed at length risks with medication and long term use, she wishes to continue at this time.  UDS today, obtain controlled substance contract next visit.  Will change from Trintellix, back to [place] which offered more benefit in past, insurance now covers she reports == Stop Trintellix.  Scripts sent.  Return in 6 months.

## 2021-01-11 NOTE — Assessment & Plan Note (Signed)
Chronic, ongoing.  Continue Gabapentin at this time which offers her benefit for rest and discomfort.  Monitor kidney function closely and adjust as needed.  B12 level today, has history of GI surgery.

## 2021-01-11 NOTE — Assessment & Plan Note (Signed)
Chronic, ongoing with long term benzo use.  Minimally uses this with 30 tablets lasting 6 months most often.  Will send in refills today.  Discussed at length risks with medication and long term use, she wishes to continue at this time.  UDS today, obtain controlled substance contract next visit.  Will change from Trintellix, back to [place] which offered more benefit in past, insurance now covers she reports.  Scripts sent.  Denies SI/HI.  Return in 6 months.

## 2021-01-12 LAB — T4, FREE: Free T4: 0.96 ng/dL (ref 0.82–1.77)

## 2021-01-12 LAB — BASIC METABOLIC PANEL
BUN/Creatinine Ratio: 16 (ref 12–28)
BUN: 12 mg/dL (ref 8–27)
CO2: 26 mmol/L (ref 20–29)
Calcium: 8.8 mg/dL (ref 8.7–10.3)
Chloride: 101 mmol/L (ref 96–106)
Creatinine, Ser: 0.74 mg/dL (ref 0.57–1.00)
GFR calc Af Amer: 96 mL/min/{1.73_m2} (ref >59)
GFR calc non Af Amer: 84 mL/min/{1.73_m2} (ref >59)
Glucose: 92 mg/dL (ref 65–99)
Potassium: 3.6 mmol/L (ref 3.5–5.2)
Sodium: 142 mmol/L (ref 134–144)

## 2021-01-12 LAB — LIPID PANEL W/O CHOL/HDL RATIO
Cholesterol, Total: 164 mg/dL (ref 100–199)
HDL: 54 mg/dL (ref >39)
LDL Chol Calc (NIH): 80 mg/dL (ref 0–99)
Triglycerides: 176 mg/dL — ABNORMAL HIGH (ref 0–149)
VLDL Cholesterol Cal: 30 mg/dL (ref 5–40)

## 2021-01-12 LAB — HEMOGLOBIN A1C
Est. average glucose Bld gHb Est-mCnc: 146 mg/dL
Hgb A1c MFr Bld: 6.7 % — ABNORMAL HIGH (ref 4.8–5.6)

## 2021-01-12 LAB — TSH: TSH: 3.59 u[IU]/mL (ref 0.450–4.500)

## 2021-01-12 LAB — VITAMIN B12: Vitamin B-12: 520 pg/mL (ref 232–1245)

## 2021-01-12 NOTE — Progress Notes (Signed)
Contacted via Gem -- not sure she checks, please make sure she received below message: Good day Amanda Wang, your labs have returned: - Kidney function and electrolytes are all normal, as is B12 level. - Cholesterol levels continue to be stable, keep taking current Atorvastatin dose - Thyroid levels normal this check - Your A1c, diabetes testing, is now in diabetes range at 6.7%. Increase from previous 6.3%.  We can appraoch this in two directions: We could start medication called Metformin to help lower this level and decrease risks that come along with diabetes, could do a low dose which would minimize the stomach side effects that come with this (loose stool is one side effect we see) OR I could refer you for diabetes diet classes and you could focus heavily on diet and regular activity for 3 months then recheck this -- however if you have any increase thirst, urination, or eating habit would want to see you sooner.  Which direction would you like to take?  Either way I would want you to return in 3 months and at visit can meet the new physician.  Let me know please.  Thank you. Keep being awesome!!  Thank you for allowing me to participate in your care. Kindest regards, Lashawn Orrego

## 2021-01-14 LAB — DRUG SCREEN 764883 11+OXYCO+ALC+CRT-BUND
Amphetamines, Urine: NEGATIVE ng/mL
BENZODIAZ UR QL: NEGATIVE ng/mL
Barbiturate: NEGATIVE ng/mL
Cannabinoid Quant, Ur: NEGATIVE ng/mL
Cocaine (Metabolite): NEGATIVE ng/mL
Creatinine: 54.2 mg/dL (ref 20.0–300.0)
Ethanol: NEGATIVE %
Meperidine: NEGATIVE ng/mL
Methadone Screen, Urine: NEGATIVE ng/mL
OPIATE SCREEN URINE: NEGATIVE ng/mL
Oxycodone/Oxymorphone, Urine: NEGATIVE ng/mL
Phencyclidine: NEGATIVE ng/mL
Propoxyphene: NEGATIVE ng/mL
Tramadol: NEGATIVE ng/mL
pH, Urine: 6.3 (ref 4.5–8.9)

## 2021-01-21 ENCOUNTER — Other Ambulatory Visit: Payer: Self-pay

## 2021-01-21 DIAGNOSIS — I10 Essential (primary) hypertension: Secondary | ICD-10-CM

## 2021-01-21 MED ORDER — HYDROCHLOROTHIAZIDE 25 MG PO TABS: 25.0000 mg | ORAL_TABLET | Freq: Every day | ORAL | 4 refills | Status: DC

## 2021-01-21 NOTE — Telephone Encounter (Signed)
Refill request for HCTZ 25 mg tab QD  LOV 01/14/21  Last refill 11/15/20  Upcoming visit 10/17/21

## 2021-01-26 ENCOUNTER — Other Ambulatory Visit: Payer: Self-pay | Admitting: Nurse Practitioner

## 2021-01-26 DIAGNOSIS — F339 Major depressive disorder, recurrent, unspecified: Secondary | ICD-10-CM

## 2021-01-27 ENCOUNTER — Other Ambulatory Visit: Payer: Self-pay | Admitting: Nurse Practitioner

## 2021-01-27 DIAGNOSIS — E669 Obesity, unspecified: Secondary | ICD-10-CM

## 2021-01-27 DIAGNOSIS — E1169 Type 2 diabetes mellitus with other specified complication: Secondary | ICD-10-CM

## 2021-01-27 MED ORDER — LORAZEPAM 1 MG PO TABS: 0.5000 mg | ORAL_TABLET | Freq: Every day | ORAL | 2 refills | Status: DC | PRN

## 2021-02-04 ENCOUNTER — Other Ambulatory Visit: Payer: Medicare PPO

## 2021-02-04 ENCOUNTER — Other Ambulatory Visit: Payer: Self-pay | Admitting: Nurse Practitioner

## 2021-02-04 ENCOUNTER — Other Ambulatory Visit: Payer: Self-pay

## 2021-02-04 ENCOUNTER — Encounter: Payer: Self-pay | Admitting: *Deleted

## 2021-02-04 ENCOUNTER — Encounter: Payer: Medicare PPO | Attending: Nurse Practitioner | Admitting: *Deleted

## 2021-02-04 VITALS — BP 118/70 | Ht 62.0 in | Wt 169.5 lb

## 2021-02-04 DIAGNOSIS — E119 Type 2 diabetes mellitus without complications: Secondary | ICD-10-CM

## 2021-02-04 DIAGNOSIS — Z20822 Contact with and (suspected) exposure to covid-19: Secondary | ICD-10-CM

## 2021-02-04 DIAGNOSIS — E669 Obesity, unspecified: Secondary | ICD-10-CM | POA: Insufficient documentation

## 2021-02-04 MED ORDER — ONETOUCH VERIO W/DEVICE KIT: PACK | 0 refills | Status: DC

## 2021-02-04 MED ORDER — ONETOUCH DELICA PLUS LANCET33G MISC: 2 refills | Status: DC

## 2021-02-04 MED ORDER — ONETOUCH VERIO VI STRP: ORAL_STRIP | 12 refills | Status: DC

## 2021-02-04 NOTE — Progress Notes (Signed)
Diabetes Self-Management Education  Visit Type: First/Initial  Appt. Start Time: 1110 Appt. End Time: 1225  02/04/2021  Amanda Wang, identified by name and date of birth, is a 70 y.o. female with a diagnosis of Diabetes: Type 2.   ASSESSMENT  Blood pressure 118/70, height 5\' 2"  (1.575 m), weight 169 lb 8 oz (76.9 kg). Body mass index is 31 kg/m.   Diabetes Self-Management Education - 02/04/21 1307      Visit Information   Visit Type First/Initial      Initial Visit   Diabetes Type Type 2    Are you currently following a meal plan? Yes    What type of meal plan do you follow? "low Fodmap"    Are you taking your medications as prescribed? Yes    Date Diagnosed Jan 2022      Health Coping   How would you rate your overall health? Good      Psychosocial Assessment   Patient Belief/Attitude about Diabetes Motivated to manage diabetes    Self-care barriers None    Self-management support Family;Doctor's office    Patient Concerns Nutrition/Meal planning;Weight Control    Special Needs None    Preferred Learning Style Auditory;Hands on    Learning Readiness Change in progress    How often do you need to have someone help you when you read instructions, pamphlets, or other written materials from your doctor or pharmacy? 1 - Never    What is the last grade level you completed in school? Bachelors      Pre-Education Assessment   Patient understands the diabetes disease and treatment process. Needs Instruction    Patient understands incorporating nutritional management into lifestyle. Needs Instruction    Patient undertands incorporating physical activity into lifestyle. Needs Instruction    Patient understands using medications safely. Needs Instruction    Patient understands monitoring blood glucose, interpreting and using results Needs Instruction    Patient understands prevention, detection, and treatment of acute complications. Needs Instruction    Patient understands  prevention, detection, and treatment of chronic complications. Needs Instruction    Patient understands how to develop strategies to address psychosocial issues. Needs Instruction    Patient understands how to develop strategies to promote health/change behavior. Needs Instruction      Complications   Last HgB A1C per patient/outside source 6.7 %   01/11/2021   How often do you check your blood sugar? 0 times/day (not testing)   Provided One Touch Verio Reflect meter and instructed on use. BG upon return demonstration was 229 mg/dL at 12?05 pm - 2 hrs pp.   Have you had a dilated eye exam in the past 12 months? Yes    Have you had a dental exam in the past 12 months? Yes    Are you checking your feet? No      Dietary Intake   Breakfast bagel with cream cheese; granola bar; bacon sandwich; boiled eggs    Lunch skips or eats ham and cheese, cuccumbers, Healthy Choice meal, Hot Pocket    Dinner chicken and shrimp (occasional beef and pork); potatoes, peas, beans, corns, green beans, rice, pasta, broccoli, carots, collards, aspargus, salads with cuccmbers, ham, feta cheese    Snack (evening) fruit (apple, tangerine), cuccumbers, popcorn    Beverage(s) water, fruti juice, Dr Malachi Bonds, sugar sweetened tea      Exercise   Exercise Type Light (walking / raking leaves)    How many days per week to you exercise? 2  How many minutes per day do you exercise? 20    Total minutes per week of exercise 40      Patient Education   Previous Diabetes Education No    Disease state  Definition of diabetes, type 1 and 2, and the diagnosis of diabetes;Factors that contribute to the development of diabetes    Nutrition management  Role of diet in the treatment of diabetes and the relationship between the three main macronutrients and blood glucose level;Food label reading, portion sizes and measuring food.;Reviewed blood glucose goals for pre and post meals and how to evaluate the patients' food intake on their  blood glucose level.    Physical activity and exercise  Role of exercise on diabetes management, blood pressure control and cardiac health.    Monitoring Taught/evaluated SMBG meter.;Purpose and frequency of SMBG.;Taught/discussed recording of test results and interpretation of SMBG.;Identified appropriate SMBG and/or A1C goals.    Chronic complications Relationship between chronic complications and blood glucose control    Psychosocial adjustment Role of stress on diabetes;Identified and addressed patients feelings and concerns about diabetes      Individualized Goals (developed by patient)   Reducing Risk Other (comment)   lose weight     Outcomes   Expected Outcomes Demonstrated interest in learning. Expect positive outcomes           Individualized Plan for Diabetes Self-Management Training:   Learning Objective:  Patient will have a greater understanding of diabetes self-management. Patient education plan is to attend individual and/or group sessions per assessed needs and concerns.   Plan:   Patient Instructions  Check blood sugars 2 x day before breakfast and 2 hrs after one meal - 3 x week Bring blood sugar records to the next class/appointment  Call your doctor for a prescription for:  1. Meter strips (type) One Touch Verio   checking  3 times per week  2. Lancets (type) One Touch Delica Plus  checking  3   times per week  Exercise: Continue walking for 20  minutes  1-2 days a week and gradually increase to 30 minutes 5 x week  Eat 3 meals day, 1-2  snacks a day Space meals 4-6 hours apart Don't skip meals  Avoid sugar sweetened drinks (soda, tea, juice)  Call back if you want to schedule classes or an appointment with the nurse or dietitian  Expected Outcomes:  Demonstrated interest in learning. Expect positive outcomes  Education material provided:  General Meal Planning Guidelines Simple Meal Plan Meter = One Touch Verio Reflect  If problems or questions,  patient to contact team via:  Johny Drilling, RN, [place] [phone]  Future DSME appointment:  Patient needs to check her calendar and call back to schedule Diabetes follow up. She was offered classes or an appointment with the nurse or dietitian.

## 2021-02-04 NOTE — Patient Instructions (Addendum)
Check blood sugars 2 x day before breakfast and 2 hrs after one meal - 3 x week Bring blood sugar records to the next class/appointment  Call your doctor for a prescription for:  1. Meter strips (type) One Touch Verio   checking  3 times per week  2. Lancets (type) One Touch Delica Plus  checking  3   times per week  Exercise: Continue walking for 20  minutes  1-2 days a week and gradually increase to 30 minutes 5 x week  Eat 3 meals day, 1-2  snacks a day Space meals 4-6 hours apart Don't skip meals  Avoid sugar sweetened drinks (soda, tea, juice)  Call back if you want to schedule classes or an appointment with the nurse or dietitian

## 2021-02-05 LAB — NOVEL CORONAVIRUS, NAA: SARS-CoV-2, NAA: NOT DETECTED

## 2021-02-05 LAB — SARS-COV-2, NAA 2 DAY TAT

## 2021-02-16 ENCOUNTER — Other Ambulatory Visit: Payer: Self-pay

## 2021-02-16 DIAGNOSIS — G2581 Restless legs syndrome: Secondary | ICD-10-CM

## 2021-02-16 NOTE — Telephone Encounter (Signed)
Patient was last seen on 01/11/21 and upcoming appointment in 10/17/21

## 2021-02-17 MED ORDER — CYCLOBENZAPRINE HCL 10 MG PO TABS: ORAL_TABLET | ORAL | 1 refills | Status: DC

## 2021-03-08 ENCOUNTER — Encounter: Payer: Self-pay | Admitting: *Deleted

## 2021-03-22 LAB — HM DIABETES EYE EXAM

## 2021-05-09 ENCOUNTER — Other Ambulatory Visit: Payer: Self-pay

## 2021-05-09 ENCOUNTER — Other Ambulatory Visit: Payer: Self-pay | Admitting: Internal Medicine

## 2021-05-09 DIAGNOSIS — E785 Hyperlipidemia, unspecified: Secondary | ICD-10-CM

## 2021-05-09 DIAGNOSIS — I10 Essential (primary) hypertension: Secondary | ICD-10-CM

## 2021-05-09 MED ORDER — AMLODIPINE BESYLATE 5 MG PO TABS: 5.0000 mg | ORAL_TABLET | Freq: Every day | ORAL | 0 refills | Status: DC

## 2021-05-09 MED ORDER — ATORVASTATIN CALCIUM 40 MG PO TABS: 40.0000 mg | ORAL_TABLET | Freq: Every day | ORAL | 0 refills | Status: DC

## 2021-05-09 NOTE — Telephone Encounter (Addendum)
Refill order received for Atorvastatin 40mg  and Amlodipine 5 mg  LOV 01/27/21 with Jolene Cannady  No upcoming appt noted.

## 2021-05-30 ENCOUNTER — Telehealth: Payer: Self-pay | Admitting: Internal Medicine

## 2021-05-30 NOTE — Telephone Encounter (Signed)
Patient called to ask if she could get a courtesy refill until her appt. On 6/23.  She stated that she is having bad cramps and does not have any more medication.

## 2021-05-30 NOTE — Telephone Encounter (Signed)
Called pt scheduled for tomorrow with Dr Neomia Dear she had scheduled on mychart with Jolene. Pt states that she has chronic leg cramps and hasn't been able to sleep the last 2 nights. Wanted to see if refill can be put in today for flexeril. Please advise

## 2021-05-31 ENCOUNTER — Encounter: Payer: Self-pay | Admitting: Internal Medicine

## 2021-05-31 ENCOUNTER — Other Ambulatory Visit: Payer: Self-pay

## 2021-05-31 ENCOUNTER — Ambulatory Visit: Payer: Medicare PPO | Admitting: Internal Medicine

## 2021-05-31 DIAGNOSIS — E785 Hyperlipidemia, unspecified: Secondary | ICD-10-CM

## 2021-05-31 DIAGNOSIS — F339 Major depressive disorder, recurrent, unspecified: Secondary | ICD-10-CM | POA: Diagnosis not present

## 2021-05-31 DIAGNOSIS — G2581 Restless legs syndrome: Secondary | ICD-10-CM | POA: Diagnosis not present

## 2021-05-31 DIAGNOSIS — J3089 Other allergic rhinitis: Secondary | ICD-10-CM

## 2021-05-31 DIAGNOSIS — I10 Essential (primary) hypertension: Secondary | ICD-10-CM | POA: Diagnosis not present

## 2021-05-31 DIAGNOSIS — G935 Compression of brain: Secondary | ICD-10-CM

## 2021-05-31 LAB — URINALYSIS, ROUTINE W REFLEX MICROSCOPIC
Bilirubin, UA: NEGATIVE
Glucose, UA: NEGATIVE
Ketones, UA: NEGATIVE
Leukocytes,UA: NEGATIVE
Nitrite, UA: NEGATIVE
Protein,UA: NEGATIVE
RBC, UA: NEGATIVE
Specific Gravity, UA: 1.015 (ref 1.005–1.030)
Urobilinogen, Ur: 0.2 mg/dL (ref 0.2–1.0)
pH, UA: 5.5 (ref 5.0–7.5)

## 2021-05-31 LAB — BAYER DCA HB A1C WAIVED: HB A1C (BAYER DCA - WAIVED): 6.3 % (ref <7.0)

## 2021-05-31 MED ORDER — ATORVASTATIN CALCIUM 40 MG PO TABS: 40.0000 mg | ORAL_TABLET | Freq: Every day | ORAL | 1 refills | Status: DC

## 2021-05-31 MED ORDER — LEVETIRACETAM 500 MG PO TABS: 500.0000 mg | ORAL_TABLET | Freq: Every day | ORAL | 1 refills | Status: DC

## 2021-05-31 MED ORDER — BENAZEPRIL HCL 20 MG PO TABS: 20.0000 mg | ORAL_TABLET | Freq: Every day | ORAL | 1 refills | Status: DC

## 2021-05-31 MED ORDER — AMLODIPINE BESYLATE 5 MG PO TABS: 5.0000 mg | ORAL_TABLET | Freq: Every day | ORAL | 1 refills | Status: DC

## 2021-05-31 MED ORDER — LORAZEPAM 1 MG PO TABS: 0.5000 mg | ORAL_TABLET | Freq: Every day | ORAL | 2 refills | Status: DC | PRN

## 2021-05-31 MED ORDER — CYCLOBENZAPRINE HCL 10 MG PO TABS: ORAL_TABLET | ORAL | 1 refills | Status: DC

## 2021-05-31 MED ORDER — MONTELUKAST SODIUM 10 MG PO TABS: 10.0000 mg | ORAL_TABLET | Freq: Every day | ORAL | 1 refills | Status: DC

## 2021-05-31 MED ORDER — GABAPENTIN 300 MG PO CAPS: 300.0000 mg | ORAL_CAPSULE | Freq: Two times a day (BID) | ORAL | 1 refills | Status: DC | PRN

## 2021-05-31 NOTE — Progress Notes (Signed)
BP 127/78   Pulse 82   Temp (!) 97.4 F (36.3 C) (Oral)   Ht 5' 1"  (1.549 m)   Wt 165 lb (74.8 kg)   SpO2 96%   BMI 31.18 kg/m    Subjective:    Patient ID: Amanda Wang, female    DOB: [birth year], 70 y.o.   MRN: [id number]  Chief Complaint  Patient presents with  . Depression  . Hyperlipidemia  . Hypertension  . RLS  . Diabetes    Eye exam requested from Dr. Ellin Mayhew    HPI: Amanda Wang is a 70 y.o. female  Has severe cramping  was on flexeril olives/ cucumbers was trying to reduce her cramping   Depression        This is a chronic problem.  Associated symptoms include not irritable, no myalgias, no headaches and no indigestion.   Pertinent negatives include no anxiety. Hyperlipidemia Pertinent negatives include no myalgias.  Hypertension The problem is controlled. Pertinent negatives include no anxiety, blurred vision, headaches, malaise/fatigue, neck pain, orthopnea or palpitations.  Diabetes She presents for her follow-up diabetic visit. She has type 2 diabetes mellitus. Pertinent negatives for hypoglycemia include no headaches. Pertinent negatives for diabetes include no blurred vision and no foot ulcerations.    Chief Complaint  Patient presents with  . Depression  . Hyperlipidemia  . Hypertension  . RLS  . Diabetes    Eye exam requested from Dr. Ellin Mayhew    Relevant past medical, surgical, family and social history reviewed and updated as indicated. Interim medical history since our last visit reviewed. Allergies and medications reviewed and updated.  Review of Systems  Constitutional:  Negative for malaise/fatigue.  Eyes:  Negative for blurred vision.  Cardiovascular:  Negative for palpitations and orthopnea.  Musculoskeletal:  Negative for myalgias and neck pain.  Neurological:  Negative for headaches.  Psychiatric/Behavioral:  Positive for depression.    Per HPI unless specifically indicated above     Objective:    BP 127/78   Pulse  82   Temp (!) 97.4 F (36.3 C) (Oral)   Ht 5' 1"  (1.549 m)   Wt 165 lb (74.8 kg)   SpO2 96%   BMI 31.18 kg/m   Wt Readings from Last 3 Encounters:  05/31/21 165 lb (74.8 kg)  02/04/21 169 lb 8 oz (76.9 kg)  01/11/21 169 lb 6.4 oz (76.8 kg)    Physical Exam Vitals and nursing note reviewed.  Constitutional:      General: She is not irritable.She is not in acute distress.    Appearance: Normal appearance. She is not ill-appearing or diaphoretic.  HENT:     Head: Normocephalic and atraumatic.     Right Ear: Tympanic membrane and external ear normal. There is no impacted cerumen.     Left Ear: External ear normal.     Nose: No congestion or rhinorrhea.     Mouth/Throat:     Pharynx: No oropharyngeal exudate or posterior oropharyngeal erythema.  Eyes:     Conjunctiva/sclera: Conjunctivae normal.     Pupils: Pupils are equal, round, and reactive to light.  Cardiovascular:     Rate and Rhythm: Normal rate and regular rhythm.     Heart sounds: No murmur heard.   No friction rub. No gallop.  Pulmonary:     Effort: No respiratory distress.     Breath sounds: No stridor. No wheezing or rhonchi.  Chest:     Chest wall: No tenderness.  Abdominal:  General: Abdomen is flat. Bowel sounds are normal. There is no distension.     Palpations: Abdomen is soft. There is no mass.     Tenderness: There is no abdominal tenderness. There is no guarding.  Musculoskeletal:        General: No swelling or deformity.     Cervical back: Normal range of motion and neck supple. No rigidity or tenderness.     Right lower leg: No edema.     Left lower leg: No edema.  Skin:    General: Skin is warm and dry.     Coloration: Skin is not jaundiced.     Findings: No erythema.  Neurological:     Mental Status: She is alert and oriented to person, place, and time. Mental status is at baseline.  Psychiatric:        Mood and Affect: Mood normal.        Behavior: Behavior normal.        Thought Content:  Thought content normal.        Judgment: Judgment normal.    Results for orders placed or performed in visit on 02/04/21  Novel Coronavirus, NAA ([facility])   Specimen: Nasopharyngeal(NP) swabs in vial transport medium   Nasopharynge  Screenin  Result Value Ref Range   SARS-CoV-2, NAA Not Detected Not Detected  SARS-COV-2, NAA 2 DAY TAT   Nasopharynge  Screenin  Result Value Ref Range   SARS-CoV-2, NAA 2 DAY TAT Performed         Current Outpatient Medications:  .  amLODipine (NORVASC) 5 MG tablet, Take 1 tablet (5 mg total) by mouth daily. .needs appt with PCP prior to refills, Disp: 30 tablet, Rfl: 0 .  atorvastatin (LIPITOR) 40 MG tablet, Take 1 tablet (40 mg total) by mouth daily at 6 PM. Need appt with PCP prior to refill, Disp: 30 tablet, Rfl: 0 .  benazepril (LOTENSIN) 20 MG tablet, Take 1 tablet (20 mg total) by mouth daily., Disp: 90 tablet, Rfl: 1 .  Blood Glucose Monitoring Suppl (ONETOUCH VERIO) w/Device KIT, Use to check blood sugar 2-3 times daily and document -- bring to provider visits.  Goal is <130 fasting and <180 2 hours after eating., Disp: 1 kit, Rfl: 0 .  Charcoal Activated (ACTIVATED CHARCOAL PO), Take 2 capsules by mouth as needed., Disp: , Rfl:  .  cyclobenzaprine (FLEXERIL) 10 MG tablet, take 1 tablet by mouth three times a day if needed for muscle spasm, Disp: 30 tablet, Rfl: 1 .  gabapentin (NEURONTIN) 300 MG capsule, Take 1 capsule (300 mg total) by mouth 2 (two) times daily as needed (leg cramps)., Disp: 180 capsule, Rfl: 1 .  glucose blood (ONETOUCH VERIO) test strip, Use to check blood sugar 2-3 times daily and document -- bring to provider visits., Disp: 100 each, Rfl: 12 .  hydrochlorothiazide (HYDRODIURIL) 25 MG tablet, Take 1 tablet (25 mg total) by mouth daily., Disp: 90 tablet, Rfl: 4 .  Lancets (ONETOUCH DELICA PLUS [id number]) MISC, Use to check blood sugar 2-3 times daily and document -- bring to provider visits., Disp: 100 each, Rfl: 2 .   levETIRAcetam (KEPPRA) 500 MG tablet, Take 1 tablet (500 mg total) by mouth daily., Disp: 90 tablet, Rfl: 1 .  LORazepam (ATIVAN) 1 MG tablet, Take 0.5-1 tablets (0.5-1 mg total) by mouth daily as needed for anxiety., Disp: 30 tablet, Rfl: 2 .  montelukast (SINGULAIR) 10 MG tablet, Take 1 tablet (10 mg total) by mouth at bedtime., Disp:  90 tablet, Rfl: 1 .  rifaximin (XIFAXAN) 550 MG TABS tablet, Take 1 tablet by mouth daily as needed., Disp: , Rfl:  .  Vilazodone HCl (VIIBRYD) 20 MG TABS, Take 1 tablet (20 mg total) by mouth daily., Disp: 90 tablet, Rfl: 4    Assessment & Plan:  Chiari malformation on keppra for muslce twitching.  Was seen by neurology s/p brain surg @ [facility] Dr. Fatima Sanger 2006   IBS-D / SIBO :  Is on xifansin x 12 weeks in 2020  Pt has a ho gastric sleeve in 2013 with subsequent repair hiatal hernia in September 2019 now presents with significant diarrhea. She has been evaluated and treated bygastroenterology and diagnosed with SEBO  3. A1c at 6.7 in January is checking her sugars @ home. Was seeing  a nutionist for such  FSSB 107   4. Depression is on viibryd for such stable.   5. Anxiety : is on ativan for such prn needs such x  1 week  Last time she had it was 2 weeks ago.  Problem List Items Addressed This Visit       Respiratory   Allergic rhinitis due to allergen     Nervous and Auditory   Chiari malformation type I ([place])     Other   Depression, recurrent ([place])   Restless legs   Other Visit Diagnoses     Essential hypertension       Hyperlipidemia, unspecified hyperlipidemia type            Follow up plan: No follow-ups on file.

## 2021-06-01 LAB — LIPID PANEL
Chol/HDL Ratio: 2.5 ratio (ref 0.0–4.4)
Cholesterol, Total: 158 mg/dL (ref 100–199)
HDL: 62 mg/dL (ref >39)
LDL Chol Calc (NIH): 74 mg/dL (ref 0–99)
Triglycerides: 127 mg/dL (ref 0–149)
VLDL Cholesterol Cal: 22 mg/dL (ref 5–40)

## 2021-06-01 LAB — HEPATIC FUNCTION PANEL
ALT: 15 IU/L (ref 0–32)
AST: 14 IU/L (ref 0–40)
Albumin: 4.2 g/dL (ref 3.8–4.8)
Alkaline Phosphatase: 119 IU/L (ref 44–121)
Bilirubin Total: 0.4 mg/dL (ref 0.0–1.2)
Bilirubin, Direct: 0.1 mg/dL (ref 0.00–0.40)
Total Protein: 7.3 g/dL (ref 6.0–8.5)

## 2021-06-01 LAB — BASIC METABOLIC PANEL
BUN/Creatinine Ratio: 21 (ref 12–28)
BUN: 15 mg/dL (ref 8–27)
CO2: 26 mmol/L (ref 20–29)
Calcium: 8.9 mg/dL (ref 8.7–10.3)
Chloride: 102 mmol/L (ref 96–106)
Creatinine, Ser: 0.72 mg/dL (ref 0.57–1.00)
Glucose: 96 mg/dL (ref 65–99)
Potassium: 4.1 mmol/L (ref 3.5–5.2)
Sodium: 140 mmol/L (ref 134–144)
eGFR: 91 mL/min/{1.73_m2} (ref >59)

## 2021-06-01 LAB — VITAMIN B12: Vitamin B-12: 553 pg/mL (ref 232–1245)

## 2021-06-09 ENCOUNTER — Ambulatory Visit: Payer: Medicare PPO | Admitting: Nurse Practitioner

## 2021-07-02 ENCOUNTER — Other Ambulatory Visit: Payer: Self-pay | Admitting: Nurse Practitioner

## 2021-07-02 DIAGNOSIS — G935 Compression of brain: Secondary | ICD-10-CM

## 2021-07-02 NOTE — Telephone Encounter (Signed)
Requested medication (s) are due for refill today: no  Requested medication (s) are on the active medication list: yes  Last refill:  05/31/21  Future visit scheduled: no  Notes to clinic:  Med not delegated to NT to RF   Requested Prescriptions  Pending Prescriptions Disp Refills   levETIRAcetam (KEPPRA) 500 MG tablet [Pharmacy Med Name: LEVETIRACETAM 500MG  TABLETS] 90 tablet 1    Sig: TAKE 1 TABLET(500 MG) BY MOUTH DAILY      Not Delegated - Neurology:  Anticonvulsants Failed - 07/02/2021 11:50 AM      Failed - This refill cannot be delegated      Passed - HCT in normal range and within 360 days    Hematocrit  Date Value Ref Range Status  07/16/2020 39.5 34.0 - 46.6 % Final          Passed - HGB in normal range and within 360 days    Hemoglobin  Date Value Ref Range Status  07/16/2020 12.1 11.1 - 15.9 g/dL Final          Passed - PLT in normal range and within 360 days    Platelets  Date Value Ref Range Status  07/16/2020 415 150 - 450 x10E3/uL Final          Passed - WBC in normal range and within 360 days    WBC  Date Value Ref Range Status  07/16/2020 10.2 3.4 - 10.8 x10E3/uL Final  07/24/2012 7.0 3.6 - 11.0 x10 3/mm 3 Final          Passed - Valid encounter within last 12 months    Recent Outpatient Visits           1 month ago Essential hypertension   [place] Vigg, Avanti, MD   5 months ago Depression, recurrent ([place])   [place], [place] T, NP   7 months ago [place], Alison M, DO   7 months ago Dermatitis due to plants, including poison ivy, sumac, and oak   [facility] Eulogio Bear, NP   11 months ago Depression, recurrent ([place])   [place], Jessica A, NP       Future Appointments             In 3 months [place], [place]

## 2021-07-04 NOTE — Telephone Encounter (Signed)
Lvm to make this apt. 

## 2021-07-05 ENCOUNTER — Other Ambulatory Visit: Payer: Self-pay | Admitting: Internal Medicine

## 2021-07-05 ENCOUNTER — Telehealth: Payer: Self-pay

## 2021-07-05 DIAGNOSIS — G2581 Restless legs syndrome: Secondary | ICD-10-CM

## 2021-07-05 MED ORDER — CYCLOBENZAPRINE HCL 10 MG PO TABS: ORAL_TABLET | ORAL | 1 refills | Status: DC

## 2021-07-05 NOTE — Telephone Encounter (Signed)
What med does she need ?

## 2021-07-05 NOTE — Telephone Encounter (Signed)
Patient is taking 1 tablet daily.

## 2021-07-05 NOTE — Telephone Encounter (Signed)
It looks like this was written in June for 90 with a refill, does she need an appt?

## 2021-07-05 NOTE — Telephone Encounter (Signed)
Patient would like a 90 day supply with a refill so that she will have enough until her next visit

## 2021-07-05 NOTE — Telephone Encounter (Signed)
Copied from [place] [id number]. Topic: General - Other >> Jul 04, 2021  5:12 PM Camille Bal, Gerlene Burdock wrote: Reason for HTX:HFSFSEL called in wants call back regarding why she has to come back in for another appt for refills, when she already came in last month. Please call back

## 2021-07-05 NOTE — Telephone Encounter (Signed)
Flexeril

## 2021-07-07 NOTE — Telephone Encounter (Signed)
Needs to see neurology for this thnx.

## 2021-07-07 NOTE — Telephone Encounter (Signed)
Would pt need apt for this she would like to know if it can be refilled with out apt

## 2021-08-29 ENCOUNTER — Other Ambulatory Visit: Payer: Self-pay | Admitting: Internal Medicine

## 2021-08-29 DIAGNOSIS — Z1231 Encounter for screening mammogram for malignant neoplasm of breast: Secondary | ICD-10-CM

## 2021-09-05 ENCOUNTER — Other Ambulatory Visit: Payer: Self-pay

## 2021-09-05 ENCOUNTER — Ambulatory Visit
Admission: RE | Admit: 2021-09-05 | Discharge: 2021-09-05 | Disposition: A | Discharge status: Home / Self Care | Payer: Medicare PPO | Source: Ambulatory Visit | Attending: Internal Medicine | Admitting: Internal Medicine

## 2021-09-05 DIAGNOSIS — Z1231 Encounter for screening mammogram for malignant neoplasm of breast: Secondary | ICD-10-CM | POA: Diagnosis not present

## 2021-09-12 ENCOUNTER — Telehealth: Payer: Medicare PPO | Admitting: Internal Medicine

## 2021-09-12 ENCOUNTER — Other Ambulatory Visit: Payer: Self-pay

## 2021-09-12 ENCOUNTER — Encounter: Payer: Self-pay | Admitting: Internal Medicine

## 2021-09-12 DIAGNOSIS — I10 Essential (primary) hypertension: Secondary | ICD-10-CM | POA: Diagnosis not present

## 2021-09-12 MED ORDER — BENAZEPRIL HCL 40 MG PO TABS: 40.0000 mg | ORAL_TABLET | Freq: Every day | ORAL | 5 refills | Status: DC

## 2021-09-12 MED ORDER — VILAZODONE HCL 20 MG PO TABS: 40.0000 mg | ORAL_TABLET | Freq: Every day | ORAL | 4 refills | Status: DC

## 2021-09-12 NOTE — Progress Notes (Signed)
Please let pt know this was normal.

## 2021-09-12 NOTE — Progress Notes (Signed)
There were no vitals taken for this visit.   Subjective:    Patient ID: Amanda Wang, female    DOB: September 30, 1951, 70 y.o.   MRN: [id number]  Chief Complaint  Patient presents with   Medication Reaction    Patient states that when she takes her HCTZ in the evenings it causes cramps and she cant sleep. Patient aslo states that she is un able to take her Viibryd at night either since it makes her unable to sleep and makes her fall asleep during the day when she sits and relaxes     HPI: Amanda Wang is a 70 y.o. female  Pt says she has cramping from hydrochlorothiazide. Has some swelling during the day if shes on her feet by the end of the day.   Chief Complaint  Patient presents with   Medication Reaction    Patient states that when she takes her HCTZ in the evenings it causes cramps and she cant sleep. Patient aslo states that she is un able to take her Viibryd at night either since it makes her unable to sleep and makes her fall asleep during the day when she sits and relaxes     Relevant past medical, surgical, family and social history reviewed and updated as indicated. Interim medical history since our last visit reviewed. Allergies and medications reviewed and updated.  Review of Systems  Per HPI unless specifically indicated above     Objective:    There were no vitals taken for this visit.  Wt Readings from Last 3 Encounters:  05/31/21 165 lb (74.8 kg)  02/04/21 169 lb 8 oz (76.9 kg)  01/11/21 169 lb 6.4 oz (76.8 kg)    Physical Exam  Unable to peform sec to virtual visit.   Results for orders placed or performed in visit on 06/06/21  HM DIABETES EYE EXAM  Result Value Ref Range   HM Diabetic Eye Exam No Retinopathy No Retinopathy        Current Outpatient Medications:    atorvastatin (LIPITOR) 40 MG tablet, Take 1 tablet (40 mg total) by mouth daily at 6 PM. Need appt with PCP prior to refill, Disp: 90 tablet, Rfl: 1   benazepril (LOTENSIN) 20  MG tablet, Take 1 tablet (20 mg total) by mouth daily., Disp: 90 tablet, Rfl: 1   Blood Glucose Monitoring Suppl (ONETOUCH VERIO) w/Device KIT, Use to check blood sugar 2-3 times daily and document -- bring to provider visits.  Goal is <130 fasting and <180 2 hours after eating., Disp: 1 kit, Rfl: 0   Charcoal Activated (ACTIVATED CHARCOAL PO), Take 2 capsules by mouth as needed., Disp: , Rfl:    cyclobenzaprine (FLEXERIL) 10 MG tablet, take 1 tablet by mouth three times a day if needed for muscle spasm, Disp: 90 tablet, Rfl: 1   gabapentin (NEURONTIN) 300 MG capsule, Take 1 capsule (300 mg total) by mouth 2 (two) times daily as needed (leg cramps)., Disp: 180 capsule, Rfl: 1   glucose blood (ONETOUCH VERIO) test strip, Use to check blood sugar 2-3 times daily and document -- bring to provider visits., Disp: 100 each, Rfl: 12   hydrochlorothiazide (HYDRODIURIL) 25 MG tablet, Take 1 tablet (25 mg total) by mouth daily., Disp: 90 tablet, Rfl: 4   Lancets (ONETOUCH DELICA PLUS [id number]) MISC, Use to check blood sugar 2-3 times daily and document -- bring to provider visits., Disp: 100 each, Rfl: 2   levETIRAcetam (KEPPRA) 500 MG tablet, TAKE 1  TABLET(500 MG) BY MOUTH DAILY, Disp: 90 tablet, Rfl: 1   LORazepam (ATIVAN) 1 MG tablet, Take 0.5-1 tablets (0.5-1 mg total) by mouth daily as needed for anxiety., Disp: 30 tablet, Rfl: 2   montelukast (SINGULAIR) 10 MG tablet, Take 1 tablet (10 mg total) by mouth at bedtime., Disp: 90 tablet, Rfl: 1   Vilazodone HCl (VIIBRYD) 20 MG TABS, Take 1 tablet (20 mg total) by mouth daily., Disp: 90 tablet, Rfl: 4   amLODipine (NORVASC) 5 MG tablet, Take 1 tablet (5 mg total) by mouth daily. .needs appt with PCP prior to refills, Disp: 90 tablet, Rfl: 1    Assessment & Plan:  Lower extremity edema   - more so in her ankles Will stop amlodipine . Increase benazepril to 40 mg. Consider ECHO if stopping                   Depression is on vibryd 20 increase to 40 mg  daily.  Chronic stable  Problem List Items Addressed This Visit   None    No orders of the defined types were placed in this encounter.    No orders of the defined types were placed in this encounter.    Follow up plan: No follow-ups on file.  This visit was completed via telephone due to the restrictions of the COVID-19 pandemic. All issues as above were discussed and addressed but no physical exam was performed. If it was felt that the patient should be evaluated in the office, they were directed there. The patient verbally consented to this visit. Patient was unable to complete an audio/visual visit due to Technical difficulties", "Lack of internet. Due to the catastrophic nature of the COVID-19 pandemic, this visit was done through audio contact only. Location of the patient: home Location of the provider: work Those involved with this call:  Provider: Charlynne Cousins, MD CMA: Frazier Butt, [place] Desk/Registration: Barth Kirks  Time spent on call:  15 minutes on the phone discussing health concerns. 15 minutes total spent in review of patient's record and preparation of their chart.

## 2021-09-14 ENCOUNTER — Encounter: Payer: Self-pay | Admitting: Internal Medicine

## 2021-09-14 MED ORDER — VILAZODONE HCL 40 MG PO TABS: 40.0000 mg | ORAL_TABLET | Freq: Every day | ORAL | 0 refills | Status: DC

## 2021-09-14 NOTE — Telephone Encounter (Signed)
Pl double check with pharmacy thnx that was the only way it was in epic

## 2021-09-19 ENCOUNTER — Encounter: Payer: Self-pay | Admitting: Internal Medicine

## 2021-09-19 DIAGNOSIS — I10 Essential (primary) hypertension: Secondary | ICD-10-CM | POA: Insufficient documentation

## 2021-10-06 ENCOUNTER — Ambulatory Visit: Payer: Medicare PPO | Admitting: Internal Medicine

## 2021-10-06 ENCOUNTER — Other Ambulatory Visit: Payer: Self-pay

## 2021-10-06 ENCOUNTER — Encounter: Payer: Self-pay | Admitting: Internal Medicine

## 2021-10-06 VITALS — BP 93/62 | HR 88 | Temp 98.6°F | Ht 60.98 in | Wt 171.0 lb

## 2021-10-06 DIAGNOSIS — R5383 Other fatigue: Secondary | ICD-10-CM

## 2021-10-06 DIAGNOSIS — E785 Hyperlipidemia, unspecified: Secondary | ICD-10-CM | POA: Diagnosis not present

## 2021-10-06 DIAGNOSIS — D649 Anemia, unspecified: Secondary | ICD-10-CM

## 2021-10-06 MED ORDER — ATORVASTATIN CALCIUM 40 MG PO TABS: 40.0000 mg | ORAL_TABLET | Freq: Every day | ORAL | 1 refills | Status: DC

## 2021-10-06 MED ORDER — BENAZEPRIL HCL 20 MG PO TABS: 20.0000 mg | ORAL_TABLET | Freq: Every day | ORAL | 3 refills | Status: DC

## 2021-10-06 NOTE — Progress Notes (Signed)
BP 93/62   Pulse 88   Temp 98.6 F (37 C) (Oral)   Ht 5' 0.98" (1.549 m)   Wt 171 lb (77.6 kg)   SpO2 97%   BMI 32.33 kg/m    Subjective:    Patient ID: Amanda Wang, female    DOB: 1951-11-05, 70 y.o.   MRN: [id number]  Chief Complaint  Patient presents with   Medication Problem    Patient states that the medication given to her at last visit gives her cramps at night and does not take in evening, only in the morning.    Fatigue    Patient states that she can hardly stay awake during the day.     HPI: Amanda Wang is a 70 y.o. female  Pt says she feels tired, has had a lot of cramping, is feeling very sleepy and not herself. Of notes she is hypotensive today with a BP of 90 /60 mm hg.     Chief Complaint  Patient presents with   Medication Problem    Patient states that the medication given to her at last visit gives her cramps at night and does not take in evening, only in the morning.    Fatigue    Patient states that she can hardly stay awake during the day.     Relevant past medical, surgical, family and social history reviewed and updated as indicated. Interim medical history since our last visit reviewed. Allergies and medications reviewed and updated.  Review of Systems  Constitutional:  Positive for fatigue. Negative for activity change, appetite change, chills and fever.  HENT:  Negative for congestion, ear discharge, ear pain and facial swelling.   Eyes:  Negative for pain and itching.  Respiratory:  Negative for cough, shortness of breath and wheezing.   Cardiovascular:  Negative for chest pain, palpitations and leg swelling.  Gastrointestinal:  Negative for constipation and vomiting.  Endocrine: Negative for cold intolerance, heat intolerance, polydipsia, polyphagia and polyuria.  Genitourinary:  Negative for difficulty urinating, dysuria, flank pain, frequency, hematuria and urgency.  Musculoskeletal:  Negative for myalgias.  Skin:  Negative  for color change, rash and wound.  Hematological:  Does not bruise/bleed easily.  Psychiatric/Behavioral:  Positive for sleep disturbance. Negative for agitation, confusion, decreased concentration and suicidal ideas. The patient is nervous/anxious.    Per HPI unless specifically indicated above     Objective:    BP 93/62   Pulse 88   Temp 98.6 F (37 C) (Oral)   Ht 5' 0.98" (1.549 m)   Wt 171 lb (77.6 kg)   SpO2 97%   BMI 32.33 kg/m   Wt Readings from Last 3 Encounters:  10/06/21 171 lb (77.6 kg)  05/31/21 165 lb (74.8 kg)  02/04/21 169 lb 8 oz (76.9 kg)    Physical Exam Vitals and nursing note reviewed.  Constitutional:      General: She is not in acute distress.    Appearance: Normal appearance. She is not ill-appearing or diaphoretic.  Eyes:     Conjunctiva/sclera: Conjunctivae normal.  Pulmonary:     Breath sounds: No rhonchi.  Abdominal:     General: Abdomen is flat. Bowel sounds are normal. There is no distension.     Palpations: Abdomen is soft. There is no mass.     Tenderness: There is no abdominal tenderness. There is no guarding.  Skin:    General: Skin is warm and dry.     Coloration: Skin is not  jaundiced.     Findings: No erythema.  Neurological:     Mental Status: She is alert.    Results for orders placed or performed in visit on 06/06/21  HM DIABETES EYE EXAM  Result Value Ref Range   HM Diabetic Eye Exam No Retinopathy No Retinopathy        Current Outpatient Medications:    atorvastatin (LIPITOR) 40 MG tablet, Take 1 tablet (40 mg total) by mouth daily at 6 PM. Need appt with PCP prior to refill, Disp: 90 tablet, Rfl: 1   benazepril (LOTENSIN) 40 MG tablet, Take 1 tablet (40 mg total) by mouth daily., Disp: 30 tablet, Rfl: 5   Blood Glucose Monitoring Suppl (ONETOUCH VERIO) w/Device KIT, Use to check blood sugar 2-3 times daily and document -- bring to provider visits.  Goal is <130 fasting and <180 2 hours after eating., Disp: 1 kit, Rfl:  0   Charcoal Activated (ACTIVATED CHARCOAL PO), Take 2 capsules by mouth as needed., Disp: , Rfl:    cyclobenzaprine (FLEXERIL) 10 MG tablet, take 1 tablet by mouth three times a day if needed for muscle spasm, Disp: 90 tablet, Rfl: 1   gabapentin (NEURONTIN) 300 MG capsule, Take 1 capsule (300 mg total) by mouth 2 (two) times daily as needed (leg cramps)., Disp: 180 capsule, Rfl: 1   glucose blood (ONETOUCH VERIO) test strip, Use to check blood sugar 2-3 times daily and document -- bring to provider visits., Disp: 100 each, Rfl: 12   hydrochlorothiazide (HYDRODIURIL) 25 MG tablet, Take 1 tablet (25 mg total) by mouth daily., Disp: 90 tablet, Rfl: 4   Lancets (ONETOUCH DELICA PLUS [id number]) MISC, Use to check blood sugar 2-3 times daily and document -- bring to provider visits., Disp: 100 each, Rfl: 2   levETIRAcetam (KEPPRA) 500 MG tablet, TAKE 1 TABLET(500 MG) BY MOUTH DAILY, Disp: 90 tablet, Rfl: 1   LORazepam (ATIVAN) 1 MG tablet, Take 0.5-1 tablets (0.5-1 mg total) by mouth daily as needed for anxiety., Disp: 30 tablet, Rfl: 2   montelukast (SINGULAIR) 10 MG tablet, Take 1 tablet (10 mg total) by mouth at bedtime., Disp: 90 tablet, Rfl: 1   Vilazodone HCl (VIIBRYD) 40 MG TABS, Take 1 tablet (40 mg total) by mouth daily., Disp: 90 tablet, Rfl: 0   A/P :    Fatigued per her verbal record. Will check TSH/ Ft4/ CMP/ CBC   3. Cramping ? Sec to HCTZ given cramping / Hypotension.  Check CMP.   3 Hypotenion ? Cause ? Dehydration   Problem List Items Addressed This Visit   None    Orders Placed This Encounter  Procedures   CBC with Differential/Platelet   Comprehensive metabolic panel   Urinalysis, Routine w reflex microscopic   TSH   T4, free     Meds ordered this encounter  Medications   benazepril (LOTENSIN) 20 MG tablet    Sig: Take 1 tablet (20 mg total) by mouth daily.    Dispense:  30 tablet    Refill:  3   atorvastatin (LIPITOR) 40 MG tablet    Sig: Take 1 tablet (40 mg  total) by mouth daily at 6 PM. Need appt with PCP prior to refill    Dispense:  90 tablet    Refill:  1     Follow up plan: No follow-ups on file.

## 2021-10-07 ENCOUNTER — Encounter: Payer: Self-pay | Admitting: Internal Medicine

## 2021-10-07 ENCOUNTER — Ambulatory Visit: Payer: Medicare PPO

## 2021-10-07 ENCOUNTER — Other Ambulatory Visit: Payer: Self-pay

## 2021-10-07 DIAGNOSIS — D649 Anemia, unspecified: Secondary | ICD-10-CM

## 2021-10-07 DIAGNOSIS — R5383 Other fatigue: Secondary | ICD-10-CM

## 2021-10-07 LAB — CBC WITH DIFFERENTIAL/PLATELET
Basophils Absolute: 0.1 10*3/uL (ref 0.0–0.2)
Basos: 1 %
EOS (ABSOLUTE): 0.1 10*3/uL (ref 0.0–0.4)
Eos: 1 %
Hematocrit: 33.8 % — ABNORMAL LOW (ref 34.0–46.6)
Hemoglobin: 10.7 g/dL — ABNORMAL LOW (ref 11.1–15.9)
Immature Grans (Abs): 0 10*3/uL (ref 0.0–0.1)
Immature Granulocytes: 0 %
Lymphocytes Absolute: 1.9 10*3/uL (ref 0.7–3.1)
Lymphs: 20 %
MCH: 24.9 pg — ABNORMAL LOW (ref 26.6–33.0)
MCHC: 31.7 g/dL (ref 31.5–35.7)
MCV: 79 fL (ref 79–97)
Monocytes Absolute: 0.8 10*3/uL (ref 0.1–0.9)
Monocytes: 9 %
Neutrophils Absolute: 6.5 10*3/uL (ref 1.4–7.0)
Neutrophils: 69 %
Platelets: 419 10*3/uL (ref 150–450)
RBC: 4.29 x10E6/uL (ref 3.77–5.28)
RDW: 15.8 % — ABNORMAL HIGH (ref 11.7–15.4)
WBC: 9.4 10*3/uL (ref 3.4–10.8)

## 2021-10-07 LAB — URINALYSIS, ROUTINE W REFLEX MICROSCOPIC
Bilirubin, UA: NEGATIVE
Glucose, UA: NEGATIVE
Ketones, UA: NEGATIVE
Leukocytes,UA: NEGATIVE
Nitrite, UA: NEGATIVE
RBC, UA: NEGATIVE
Specific Gravity, UA: 1.025 (ref 1.005–1.030)
Urobilinogen, Ur: 0.2 mg/dL (ref 0.2–1.0)
pH, UA: 5 (ref 5.0–7.5)

## 2021-10-07 LAB — COMPREHENSIVE METABOLIC PANEL
ALT: 15 IU/L (ref 0–32)
AST: 20 IU/L (ref 0–40)
Albumin/Globulin Ratio: 1.6 (ref 1.2–2.2)
Albumin: 4.7 g/dL (ref 3.8–4.8)
Alkaline Phosphatase: 134 IU/L — ABNORMAL HIGH (ref 44–121)
BUN/Creatinine Ratio: 20 (ref 12–28)
BUN: 23 mg/dL (ref 8–27)
Bilirubin Total: 0.2 mg/dL (ref 0.0–1.2)
CO2: 23 mmol/L (ref 20–29)
Calcium: 9.3 mg/dL (ref 8.7–10.3)
Chloride: 100 mmol/L (ref 96–106)
Creatinine, Ser: 1.13 mg/dL — ABNORMAL HIGH (ref 0.57–1.00)
Globulin, Total: 3 g/dL (ref 1.5–4.5)
Glucose: 97 mg/dL (ref 70–99)
Potassium: 3.8 mmol/L (ref 3.5–5.2)
Sodium: 139 mmol/L (ref 134–144)
Total Protein: 7.7 g/dL (ref 6.0–8.5)
eGFR: 53 mL/min/{1.73_m2} — ABNORMAL LOW (ref >59)

## 2021-10-07 LAB — TSH: TSH: 1.55 u[IU]/mL (ref 0.450–4.500)

## 2021-10-07 LAB — T4, FREE: Free T4: 0.93 ng/dL (ref 0.82–1.77)

## 2021-10-07 NOTE — Progress Notes (Signed)
Patient presents today for a blood pressure check. BP was taken manually 104/52 pulse 84 and with the Dinamap was 112/72 pulse 82. Spoke with Dr. Neomia Dear, per PCP patient will hold her HCTZ, will have iron study labs performed today and was given fecal occult cards to take home and bring back on Monday. Per Dr. Neomia Dear patient will set up an appointment on Friday the 28th of Oct. or the following Monday  31st of Oct.for a follow up visit with Dr. Neomia Dear. Patient was informed by way of phone from Dr Neomia Dear that is she notices any bleeding or bright red blood from the rectum or black stools, if fatigue get worse or if blood pressure goes below 90/60 to go directly to the ER.  Patient verbalized understanding of all directions given to day.

## 2021-10-07 NOTE — Progress Notes (Signed)
Hb low need to recheck including iron studies, please see if she has Blood in stools/ black stools. Thnx. Will need to fu with pt in 2 weeks for a Fu, please see when pt had a csope last thnx.  Can we send off stool cards / she can pick them up pl thnx

## 2021-10-08 LAB — CBC WITH DIFFERENTIAL/PLATELET
Basophils Absolute: 0 10*3/uL (ref 0.0–0.2)
Basos: 0 %
EOS (ABSOLUTE): 0.1 10*3/uL (ref 0.0–0.4)
Eos: 2 %
Hematocrit: 33.3 % — ABNORMAL LOW (ref 34.0–46.6)
Hemoglobin: 10.3 g/dL — ABNORMAL LOW (ref 11.1–15.9)
Immature Grans (Abs): 0 10*3/uL (ref 0.0–0.1)
Immature Granulocytes: 0 %
Lymphocytes Absolute: 1.7 10*3/uL (ref 0.7–3.1)
Lymphs: 22 %
MCH: 24.5 pg — ABNORMAL LOW (ref 26.6–33.0)
MCHC: 30.9 g/dL — ABNORMAL LOW (ref 31.5–35.7)
MCV: 79 fL (ref 79–97)
Monocytes Absolute: 0.7 10*3/uL (ref 0.1–0.9)
Monocytes: 9 %
Neutrophils Absolute: 4.9 10*3/uL (ref 1.4–7.0)
Neutrophils: 67 %
Platelets: 401 10*3/uL (ref 150–450)
RBC: 4.2 x10E6/uL (ref 3.77–5.28)
RDW: 15.8 % — ABNORMAL HIGH (ref 11.7–15.4)
WBC: 7.4 10*3/uL (ref 3.4–10.8)

## 2021-10-08 LAB — IRON AND TIBC
Iron Saturation: 7 % — CL (ref 15–55)
Iron: 24 ug/dL — ABNORMAL LOW (ref 27–139)
Total Iron Binding Capacity: 323 ug/dL (ref 250–450)
UIBC: 299 ug/dL (ref 118–369)

## 2021-10-08 LAB — VITAMIN B12: Vitamin B-12: 520 pg/mL (ref 232–1245)

## 2021-10-08 LAB — FERRITIN: Ferritin: 17 ng/mL (ref 15–150)

## 2021-10-08 LAB — TSH: TSH: 1.42 u[IU]/mL (ref 0.450–4.500)

## 2021-10-08 LAB — FOLATE: Folate: 10.7 ng/mL (ref >3.0)

## 2021-10-10 ENCOUNTER — Telehealth: Payer: Self-pay

## 2021-10-10 MED ORDER — IRON (FERROUS SULFATE) 325 (65 FE) MG PO TABS: 325.0000 mg | ORAL_TABLET | Freq: Two times a day (BID) | ORAL | 3 refills | Status: DC

## 2021-10-10 NOTE — Progress Notes (Signed)
Low iron 17 and iron levels low at 24. Iron sats at 7. Will refer to Gi and start pt on ferrous sulfate 325 bid. Folate and b12 wnl.  ? Needs EGD / Cscope sec to above.

## 2021-10-10 NOTE — Telephone Encounter (Signed)
Called and informed patient of her lab results and that her hemoglobin has dropped again and she would need a referral to GI, she stated that she already has a GI doctor, Dr. Jonathon Bellows. Called Dr. Georgeann Oppenheim office and set up an appointment for her on 10/27 at 3:15 pm.

## 2021-10-10 NOTE — Addendum Note (Signed)
Addended by: Charlynne Cousins on: 10/10/2021 02:48 PM   Modules accepted: Orders

## 2021-10-13 ENCOUNTER — Ambulatory Visit: Payer: Medicare PPO | Admitting: Gastroenterology

## 2021-10-13 ENCOUNTER — Encounter: Payer: Self-pay | Admitting: Gastroenterology

## 2021-10-13 ENCOUNTER — Other Ambulatory Visit: Payer: Self-pay

## 2021-10-13 VITALS — BP 125/81 | HR 83 | Temp 99.0°F | Ht 60.98 in | Wt 175.8 lb

## 2021-10-13 DIAGNOSIS — D508 Other iron deficiency anemias: Secondary | ICD-10-CM

## 2021-10-13 DIAGNOSIS — D649 Anemia, unspecified: Secondary | ICD-10-CM | POA: Diagnosis not present

## 2021-10-13 MED ORDER — NA SULFATE-K SULFATE-MG SULF 17.5-3.13-1.6 GM/177ML PO SOLN: 354.0000 mL | Freq: Once | ORAL | 0 refills | Status: AC

## 2021-10-13 MED ORDER — IRON 325 (65 FE) MG PO TABS: ORAL_TABLET | ORAL | 1 refills | Status: DC

## 2021-10-13 NOTE — Addendum Note (Signed)
Addended by: Wayna Chalet on: 10/13/2021 04:09 PM   Modules accepted: Orders, SmartSet

## 2021-10-13 NOTE — Addendum Note (Signed)
Addended byRudie Meyer on: 10/13/2021 10:35 AM   Modules accepted: Orders

## 2021-10-13 NOTE — Progress Notes (Signed)
Jonathon Bellows MD, MRCP(U.K) [address]  [place]  [place], [place] [address]  Main: [phone]  Fax: [phone]   Primary Care Physician: Charlynne Cousins, MD  Primary Gastroenterologist:  Dr. Jonathon Bellows   C/C: anemia    HPI: Amanda Wang is a 70 y.o. 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 Her initial visit  was seen on 11/07/2018 referred for diarrhea.  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  She has over 3 bowel movements a day.  Watery in nature.  She has taken Imodium occasionally.  No real change of weight.  Consumed 2 cans of soda a day occasional sweet tea no hard candy or chewing gum.  Is afraid to pass gas that she thought she would have leakage.   An initial round of Xifaxan did work and most of her symptoms had settled . Once she completed it and went off the medications,  , feels like a lot of bubbles in her abdomen that bubbles,when she has a bowel movement "like a fire works " with "pops   Labs 08/2018 : CBC, CMP,TSH,copper, vitamin E,Zinc , vitamin A,iron studies-normal , 11/10/2018: Fecal calprotectin elevated at 243.  Stool studies PCR was negative.  Stool testing for C. difficile was negative.  Celiac serology negative.    11/28/18 : colonoscopy :Normal TI and random colon biopsies .      12/2018 : Hydrogen breath test was positive treated with xifaxan : 4 week course   Interval history 04/14/2019 -10/13/2021   She has been presently referred for iron deficiency anemia.  Denies any overt blood loss.  Denies any nasal bleeds, vaginal bleeding, blood in the urine, rectal bleeding, change in bowel habits, dysphagia.  Denies any NSAID use.  She was prescribed iron tablets but has not yet started it.  Denies any weight loss.  CBC Latest Ref Rng & Units 10/07/2021 10/06/2021 07/16/2020  WBC 3.4 - 10.8 x10E3/uL 7.4 9.4 10.2  Hemoglobin 11.1 - 15.9 g/dL 10.3(L) 10.7(L) 12.1  Hematocrit 34.0 - 46.6 %  33.3(L) 33.8(L) 39.5  Platelets 150 - 450 x10E3/uL 401 419 415   Iron/TIBC/Ferritin/ %Sat    Component Value Date/Time   IRON 24 (L) 10/07/2021 1551   IRON 53 07/24/2012 0928   TIBC 323 10/07/2021 1551   TIBC 296 07/24/2012 0928   FERRITIN 17 10/07/2021 1551   FERRITIN 55 07/24/2012 0928   IRONPCTSAT 7 (LL) 10/07/2021 1551   IRONPCTSAT 18 07/24/2012 0928      She has been getting better, less gas, been off xifaxan for 2 weeks, stools not runny any more- still soft . She is taking a cellulose supplement.   Current Outpatient Medications  Medication Sig Dispense Refill   amLODipine (NORVASC) 10 MG tablet Take 10 mg by mouth daily.     atorvastatin (LIPITOR) 40 MG tablet Take 1 tablet (40 mg total) by mouth daily at 6 PM. Need appt with PCP prior to refill 90 tablet 1   benazepril (LOTENSIN) 20 MG tablet Take 1 tablet (20 mg total) by mouth daily. 30 tablet 3   Blood Glucose Monitoring Suppl (ONETOUCH VERIO) w/Device KIT Use to check blood sugar 2-3 times daily and document -- bring to provider visits.  Goal is <130 fasting and <180 2 hours after eating. 1 kit 0   Charcoal Activated (ACTIVATED CHARCOAL PO) Take 2 capsules by mouth as needed.  cyclobenzaprine (FLEXERIL) 10 MG tablet take 1 tablet by mouth three times a day if needed for muscle spasm 90 tablet 1   gabapentin (NEURONTIN) 300 MG capsule Take 1 capsule (300 mg total) by mouth 2 (two) times daily as needed (leg cramps). 180 capsule 1   glucose blood (ONETOUCH VERIO) test strip Use to check blood sugar 2-3 times daily and document -- bring to provider visits. 100 each 12   hydrochlorothiazide (HYDRODIURIL) 25 MG tablet Take 1 tablet (25 mg total) by mouth daily. 90 tablet 4   Iron, Ferrous Sulfate, 325 (65 Fe) MG TABS Take 325 mg by mouth 2 (two) times daily with a meal. 60 tablet 3   Lancets (ONETOUCH DELICA PLUS [id number]) MISC Use to check blood sugar 2-3 times daily and document -- bring to provider visits. 100 each 2    levETIRAcetam (KEPPRA) 500 MG tablet TAKE 1 TABLET(500 MG) BY MOUTH DAILY 90 tablet 1   LORazepam (ATIVAN) 1 MG tablet Take 0.5-1 tablets (0.5-1 mg total) by mouth daily as needed for anxiety. 30 tablet 2   montelukast (SINGULAIR) 10 MG tablet Take 1 tablet (10 mg total) by mouth at bedtime. 90 tablet 1   Vilazodone HCl (VIIBRYD) 40 MG TABS Take 1 tablet (40 mg total) by mouth daily. 90 tablet 0   No current facility-administered medications for this visit.    Allergies as of 10/13/2021 - Review Complete 10/07/2021  Allergen Reaction Noted   Fexofenadine Anxiety 11/04/2016   Codeine Nausea Only 07/15/2015   Methylphenidate hcl  07/24/2018   Zoloft [sertraline] Other (See Comments) 07/15/2015   Magnevist [gadopentetate] Other (See Comments) 07/27/2015   Sulfa antibiotics Rash 07/15/2015    ROS:  General: Negative for anorexia, weight loss, fever, chills, fatigue, weakness. ENT: Negative for hoarseness, difficulty swallowing , nasal congestion. CV: Negative for chest pain, angina, palpitations, dyspnea on exertion, peripheral edema.  Respiratory: Negative for dyspnea at rest, dyspnea on exertion, cough, sputum, wheezing.  GI: See history of present illness. GU:  Negative for dysuria, hematuria, urinary incontinence, urinary frequency, nocturnal urination.  Endo: Negative for unusual weight change.    Physical Examination:   BP 125/81   Pulse 83   Temp 99 F (37.2 C) (Oral)   Ht 5' 0.98" (1.549 m)   Wt 175 lb 12.8 oz (79.7 kg)   BMI 33.24 kg/m   General: Well-nourished, well-developed in no acute distress.  Eyes: No icterus. Conjunctivae pink. Mouth: Oropharyngeal mucosa moist and pink , no lesions erythema or exudate. Neuro: Alert and oriented x 3.  Grossly intact. Skin: Warm and dry, no jaundice.   Psych: Alert and cooperative, normal mood and affect.   Imaging Studies: No results found.  Assessment and Plan:   Amanda Wang is a 70 y.o. y/o female referred for  iron deficiency anemia.  No overt blood loss.  Plan:  EGD +colonoscopy and if negative will need capsule study of the small bowel  Continue oral iron. H pylor breath test , celiac serology , urine analysis for blood  3.  Commence on iron tablets 325 mg twice daily every other day  I have discussed alternative options, risks & benefits,  which include, but are not limited to, bleeding, infection, perforation,respiratory complication & drug reaction.  The patient agrees with this plan & written consent will be obtained.     Dr Jonathon Bellows  MD,MRCP [facility]) Follow up in 8-12 weeks

## 2021-10-15 LAB — FECAL OCCULT BLOOD, IMMUNOCHEMICAL: Fecal Occult Bld: NEGATIVE

## 2021-10-15 LAB — H. PYLORI BREATH TEST: H pylori Breath Test: NEGATIVE

## 2021-10-16 LAB — URINALYSIS
Bilirubin, UA: NEGATIVE
Glucose, UA: NEGATIVE
Ketones, UA: NEGATIVE
Leukocytes,UA: NEGATIVE
Nitrite, UA: NEGATIVE
Protein,UA: NEGATIVE
RBC, UA: NEGATIVE
Specific Gravity, UA: 1.023 (ref 1.005–1.030)
Urobilinogen, Ur: 0.2 mg/dL (ref 0.2–1.0)
pH, UA: 5.5 (ref 5.0–7.5)

## 2021-10-16 LAB — CELIAC DISEASE AB SCREEN W/RFX
Antigliadin Abs, IgA: 7 units (ref 0–19)
IgA/Immunoglobulin A, Serum: 263 mg/dL (ref 87–352)
Transglutaminase IgA: <2 U/mL (ref 0–3)

## 2021-10-17 ENCOUNTER — Ambulatory Visit: Payer: Medicare PPO | Admitting: Internal Medicine

## 2021-10-17 ENCOUNTER — Ambulatory Visit (INDEPENDENT_AMBULATORY_CARE_PROVIDER_SITE_OTHER): Payer: Medicare PPO

## 2021-10-17 VITALS — Ht 62.0 in | Wt 170.0 lb

## 2021-10-17 DIAGNOSIS — Z0000 Encounter for general adult medical examination without abnormal findings: Secondary | ICD-10-CM

## 2021-10-17 NOTE — Patient Instructions (Signed)
Amanda Wang , Thank you for taking time to come for your Medicare Wellness Visit. I appreciate your ongoing commitment to your health goals. Please review the following plan we discussed and let me know if I can assist you in the future.   Screening recommendations/referrals: Colonoscopy: completed 11/28/2018 Mammogram: completed 09/05/2021 Bone Density: completed 06/06/2011 Recommended yearly ophthalmology/optometry visit for glaucoma screening and checkup Recommended yearly dental visit for hygiene and checkup  Vaccinations: Influenza vaccine: due Pneumococcal vaccine: due Tdap vaccine: completed 07/31/2016, due 07/31/2026 Shingles vaccine: completed   Covid-19:03/09/2020, 02/13/2020  Advanced directives: Please bring a copy of your POA (Power of [place]) and/or Living Will to your next appointment.   Conditions/risks identified: none  Next appointment: Follow up in one year for your annual wellness visit    Preventive Care 65 Years and Older, Female Preventive care refers to lifestyle choices and visits with your health care provider that can promote health and wellness. What does preventive care include? A yearly physical exam. This is also called an annual well check. Dental exams once or twice a year. Routine eye exams. Ask your health care provider how often you should have your eyes checked. Personal lifestyle choices, including: Daily care of your teeth and gums. Regular physical activity. Eating a healthy diet. Avoiding tobacco and drug use. Limiting alcohol use. Practicing safe sex. Taking low-dose aspirin every day. Taking vitamin and mineral supplements as recommended by your health care provider. What happens during an annual well check? The services and screenings done by your health care provider during your annual well check will depend on your age, overall health, lifestyle risk factors, and family history of disease. Counseling  Your health care provider may ask  you questions about your: Alcohol use. Tobacco use. Drug use. Emotional well-being. Home and relationship well-being. Sexual activity. Eating habits. History of falls. Memory and ability to understand (cognition). Work and work Statistician. Reproductive health. Screening  You may have the following tests or measurements: Height, weight, and BMI. Blood pressure. Lipid and cholesterol levels. These may be checked every 5 years, or more frequently if you are over 72 years old. Skin check. Lung cancer screening. You may have this screening every year starting at age 67 if you have a 30-pack-year history of smoking and currently smoke or have quit within the past 15 years. Fecal occult blood test (FOBT) of the stool. You may have this test every year starting at age 54. Flexible sigmoidoscopy or colonoscopy. You may have a sigmoidoscopy every 5 years or a colonoscopy every 10 years starting at age 33. Hepatitis C blood test. Hepatitis B blood test. Sexually transmitted disease (STD) testing. Diabetes screening. This is done by checking your blood sugar (glucose) after you have not eaten for a while (fasting). You may have this done every 1-3 years. Bone density scan. This is done to screen for osteoporosis. You may have this done starting at age 37. Mammogram. This may be done every 1-2 years. Talk to your health care provider about how often you should have regular mammograms. Talk with your health care provider about your test results, treatment options, and if necessary, the need for more tests. Vaccines  Your health care provider may recommend certain vaccines, such as: Influenza vaccine. This is recommended every year. Tetanus, diphtheria, and acellular pertussis (Tdap, Td) vaccine. You may need a Td booster every 10 years. Zoster vaccine. You may need this after age 68. Pneumococcal 13-valent conjugate (PCV13) vaccine. One dose is recommended after age  65. Pneumococcal  polysaccharide (PPSV23) vaccine. One dose is recommended after age 79. Talk to your health care provider about which screenings and vaccines you need and how often you need them. This information is not intended to replace advice given to you by your health care provider. Make sure you discuss any questions you have with your health care provider. Document Released: 12/31/2015 Document Revised: 08/23/2016 Document Reviewed: 10/05/2015 Elsevier Interactive Patient Education  2017 [place] Prevention in the Home Falls can cause injuries. They can happen to people of all ages. There are many things you can do to make your home safe and to help prevent falls. What can I do on the outside of my home? Regularly fix the edges of walkways and driveways and fix any cracks. Remove anything that might make you trip as you walk through a door, such as a raised step or threshold. Trim any bushes or trees on the path to your home. Use bright outdoor lighting. Clear any walking paths of anything that might make someone trip, such as rocks or tools. Regularly check to see if handrails are loose or broken. Make sure that both sides of any steps have handrails. Any raised decks and porches should have guardrails on the edges. Have any leaves, snow, or ice cleared regularly. Use sand or salt on walking paths during winter. Clean up any spills in your garage right away. This includes oil or grease spills. What can I do in the bathroom? Use night lights. Install grab bars by the toilet and in the tub and shower. Do not use towel bars as grab bars. Use non-skid mats or decals in the tub or shower. If you need to sit down in the shower, use a plastic, non-slip stool. Keep the floor dry. Clean up any water that spills on the floor as soon as it happens. Remove soap buildup in the tub or shower regularly. Attach bath mats securely with double-sided non-slip rug tape. Do not have throw rugs and other  things on the floor that can make you trip. What can I do in the bedroom? Use night lights. Make sure that you have a light by your bed that is easy to reach. Do not use any sheets or blankets that are too big for your bed. They should not hang down onto the floor. Have a firm chair that has side arms. You can use this for support while you get dressed. Do not have throw rugs and other things on the floor that can make you trip. What can I do in the kitchen? Clean up any spills right away. Avoid walking on wet floors. Keep items that you use a lot in easy-to-reach places. If you need to reach something above you, use a strong step stool that has a grab bar. Keep electrical cords out of the way. Do not use floor polish or wax that makes floors slippery. If you must use wax, use non-skid floor wax. Do not have throw rugs and other things on the floor that can make you trip. What can I do with my stairs? Do not leave any items on the stairs. Make sure that there are handrails on both sides of the stairs and use them. Fix handrails that are broken or loose. Make sure that handrails are as long as the stairways. Check any carpeting to make sure that it is firmly attached to the stairs. Fix any carpet that is loose or worn. Avoid having throw rugs at  the top or bottom of the stairs. If you do have throw rugs, attach them to the floor with carpet tape. Make sure that you have a light switch at the top of the stairs and the bottom of the stairs. If you do not have them, ask someone to add them for you. What else can I do to help prevent falls? Wear shoes that: Do not have high heels. Have rubber bottoms. Are comfortable and fit you well. Are closed at the toe. Do not wear sandals. If you use a stepladder: Make sure that it is fully opened. Do not climb a closed stepladder. Make sure that both sides of the stepladder are locked into place. Ask someone to hold it for you, if possible. Clearly  mark and make sure that you can see: Any grab bars or handrails. First and last steps. Where the edge of each step is. Use tools that help you move around (mobility aids) if they are needed. These include: Canes. Walkers. Scooters. Crutches. Turn on the lights when you go into a dark area. Replace any light bulbs as soon as they burn out. Set up your furniture so you have a clear path. Avoid moving your furniture around. If any of your floors are uneven, fix them. If there are any pets around you, be aware of where they are. Review your medicines with your doctor. Some medicines can make you feel dizzy. This can increase your chance of falling. Ask your doctor what other things that you can do to help prevent falls. This information is not intended to replace advice given to you by your health care provider. Make sure you discuss any questions you have with your health care provider. Document Released: 09/30/2009 Document Revised: 05/11/2016 Document Reviewed: 01/08/2015 Elsevier Interactive Patient Education  2017 [employer].

## 2021-10-17 NOTE — Progress Notes (Signed)
I connected with Amanda Wang today by telephone and verified that I am speaking with the correct person using two identifiers. Location patient: home Location provider: work Persons participating in the virtual visit: Rubi, Tooley LPN.   I discussed the limitations, risks, security and privacy concerns of performing an evaluation and management service by telephone and the availability of in person appointments. I also discussed with the patient that there may be a patient responsible charge related to this service. The patient expressed understanding and verbally consented to this telephonic visit.    Interactive audio and video telecommunications were attempted between this provider and patient, however failed, due to patient having technical difficulties OR patient did not have access to video capability.  We continued and completed visit with audio only.     Vital signs may be patient reported or missing.  Subjective:   Amanda Wang is a 70 y.o. female who presents for Medicare Annual (Subsequent) preventive examination.  Review of Systems     Cardiac Risk Factors include: advanced age (>76mn, >>90women);diabetes mellitus;dyslipidemia;hypertension;obesity (BMI >30kg/m2);sedentary lifestyle     Objective:    Today's Vitals   10/17/21 1026  Weight: 170 lb (77.1 kg)  Height: 5' 2"  (1.575 m)   Body mass index is 31.09 kg/m.  Advanced Directives 10/17/2021 02/04/2021 10/15/2020 09/22/2019 11/28/2018  Does Patient Have a Medical Advance Directive? Yes Yes Yes Yes Yes  Type of AParamedicof AIvaLiving will HHelixLiving will [place]rigan[place]Living will Living will;Healthcare Power of AFinl[place]Living will  Does patient want to make changes to medical advance directive? - No - Patient declined - - -  Copy of [place] Chart? No - copy requested - No  - copy requested No - copy requested No - copy requested  Would patient like information on creating a medical advance directive? - No - Patient declined - - -    Current Medications (verified) Outpatient Encounter Medications as of 10/17/2021  Medication Sig   amLODipine (NORVASC) 10 MG tablet Take 10 mg by mouth daily.   atorvastatin (LIPITOR) 40 MG tablet Take 1 tablet (40 mg total) by mouth daily at 6 PM. Need appt with PCP prior to refill   benazepril (LOTENSIN) 20 MG tablet Take 1 tablet (20 mg total) by mouth daily.   Blood Glucose Monitoring Suppl (ONETOUCH VERIO) w/Device KIT Use to check blood sugar 2-3 times daily and document -- bring to provider visits.  Goal is <130 fasting and <180 2 hours after eating.   Charcoal Activated (ACTIVATED CHARCOAL PO) Take 2 capsules by mouth as needed.   cyclobenzaprine (FLEXERIL) 10 MG tablet take 1 tablet by mouth three times a day if needed for muscle spasm   Ferrous Sulfate (IRON) 325 (65 Fe) MG TABS Take 1 tablet twice a day every other day.   glucose blood (ONETOUCH VERIO) test strip Use to check blood sugar 2-3 times daily and document -- bring to provider visits.   Lancets (ONETOUCH DELICA PLUS [id number] MISC Use to check blood sugar 2-3 times daily and document -- bring to provider visits.   levETIRAcetam (KEPPRA) 500 MG tablet TAKE 1 TABLET(500 MG) BY MOUTH DAILY   LORazepam (ATIVAN) 1 MG tablet Take 0.5-1 tablets (0.5-1 mg total) by mouth daily as needed for anxiety.   montelukast (SINGULAIR) 10 MG tablet Take 1 tablet (10 mg total) by mouth at bedtime.   Vilazodone HCl (VIIBRYD)  40 MG TABS Take 1 tablet (40 mg total) by mouth daily.   gabapentin (NEURONTIN) 300 MG capsule Take 1 capsule (300 mg total) by mouth 2 (two) times daily as needed (leg cramps). (Patient not taking: Reported on 10/17/2021)   hydrochlorothiazide (HYDRODIURIL) 25 MG tablet Take 1 tablet (25 mg total) by mouth daily. (Patient not taking: Reported on 10/17/2021)    No facility-administered encounter medications on file as of 10/17/2021.    Allergies (verified) Fexofenadine, Codeine, Methylphenidate hcl, Zoloft [sertraline], Magnevist [gadopentetate], and Sulfa antibiotics   History: Past Medical History:  Diagnosis Date   Advanced care planning/counseling discussion 09/16/2018   Arthritis    Chiari malformation type I ([place])    Depression    Diabetes mellitus without complication ([place])    GERD (gastroesophageal reflux disease)    History of hiatal hernia    Hyperlipidemia    Hypertension    Sleep apnea    Small intestinal bacterial overgrowth    Spinal stenosis    Vaccine reaction, initial encounter 03/11/2020   Past Surgical History:  Procedure Laterality Date   BREAST BIOPSY Right    neg   chiari malformation I decompression surgery  11-06   COLONOSCOPY WITH PROPOFOL N/A 11/28/2018   Procedure: COLONOSCOPY WITH PROPOFOL;  Surgeon: Jonathon Bellows, MD;  Location: [facility] ENDOSCOPY;  Service: Gastroenterology;  Laterality: N/A;   deviated septum repair     HERNIA REPAIR     Family History  Problem Relation Age of Onset   Hypertension Mother    Cancer Mother    Stroke Mother    Heart disease Father    Breast cancer Sister 26   Breast cancer Paternal Aunt    Diabetes Paternal Grandmother    Social History   Socioeconomic History   Marital status: Divorced    Spouse name: Not on file   Number of children: Not on file   Years of education: Not on file   Highest education level: Bachelor's degree (e.g., BA, AB, BS)  Occupational History   Occupation: part time   Tobacco Use   Smoking status: Former    Packs/day: 0.50    Years: 1.00    Pack years: 0.50    Types: Cigarettes    Quit date: 07/26/1977    Years since quitting: 44.2   Smokeless tobacco: Never   Tobacco comments:    barley smoked when smoked   Vaping Use   Vaping Use: Never used  Substance and Sexual Activity   Alcohol use: Yes    Alcohol/week: 0.0 standard drinks     Comment: on occasion 1-2 x month   Drug use: No   Sexual activity: Not on file  Other Topics Concern   Not on file  Social History Narrative   Not on file   Social Determinants of Health   Financial Resource Strain: Low Risk    Difficulty of Paying Living Expenses: Not hard at all  Food Insecurity: No Food Insecurity   Worried About Charity fundraiser in the Last Year: Never true   [place] in the Last Year: Never true  Transportation Needs: No Transportation Needs   Lack of Transportation (Medical): No   Lack of Transportation (Non-Medical): No  Physical Activity: Inactive   Days of Exercise per Week: 0 days   Minutes of Exercise per Session: 0 min  Stress: Stress Concern Present   Feeling of Stress : To some extent  Social Connections: Not on file    Tobacco  Counseling Counseling given: Not Answered Tobacco comments: barley smoked when smoked    Clinical Intake:  Pre-visit preparation completed: Yes  Pain : No/denies pain     Nutritional Status: BMI > 30  Obese Nutritional Risks: None Diabetes: Yes  How often do you need to have someone help you when you read instructions, pamphlets, or other written materials from your doctor or pharmacy?: 1 - Never What is the last grade level you completed in school?: bachelors degree  Diabetic? Yes Nutrition Risk Assessment:  Has the patient had any N/V/D within the last 2 months?  No  Does the patient have any non-healing wounds?  No  Has the patient had any unintentional weight loss or weight gain?  Yes   Diabetes:  Is the patient diabetic?  Yes  If diabetic, was a CBG obtained today?  No  Did the patient bring in their glucometer from home?  No  How often do you monitor your CBG's? daily.   Financial Strains and Diabetes Management:  Are you having any financial strains with the device, your supplies or your medication? No .  Does the patient want to be seen by Chronic Care Management for management  of their diabetes?  No  Would the patient like to be referred to a Nutritionist or for Diabetic Management?  No   Diabetic Exams:  Diabetic Eye Exam: Completed 03/22/2021 Diabetic Foot Exam: Overdue, Pt has been advised about the importance in completing this exam. Pt is scheduled for diabetic foot exam on next appointment.   Interpreter Needed?: No  Information entered by :: NAllen LPN   Activities of Daily Living In your present state of health, do you have any difficulty performing the following activities: 10/17/2021 05/31/2021  Hearing? N N  Vision? N N  Difficulty concentrating or making decisions? N Y  Walking or climbing stairs? N N  Dressing or bathing? N N  Doing errands, shopping? N N  Preparing Food and eating ? N -  Using the Toilet? N -  In the past six months, have you accidently leaked urine? N -  Do you have problems with loss of bowel control? N -  Managing your Medications? N -  Managing your Finances? N -  Housekeeping or managing your Housekeeping? N -  Some recent data might be hidden    Patient Care Team: Charlynne Cousins, MD as PCP - General Lucilla Lame, MD as Consulting Physician (Gastroenterology) Vanita Ingles, RN as Case Manager (General Practice)  Indicate any recent Medical Services you may have received from other than [facility] providers in the past year (date may be approximate).     Assessment:   This is a routine wellness examination for [place].  Hearing/Vision screen Vision Screening - Comments:: Regular eye exams, [facility]  Dietary issues and exercise activities discussed: Current Exercise Habits: The patient does not participate in regular exercise at present   Goals Addressed             This Visit's Progress    Patient Stated       10/17/2021, start walking, keep sugar down, and get hemoglobin back up       Depression Screen PHQ 2/9 Scores 10/17/2021 10/06/2021 05/31/2021 02/04/2021 01/11/2021 10/15/2020 07/16/2020   PHQ - 2 Score 0 2 0 0 1 0 4  PHQ- 9 Score - 12 6 - 12 4 11     Fall Risk Fall Risk  10/17/2021 10/06/2021 05/31/2021 02/04/2021 01/11/2021  Falls in the past  year? 1 1 0 0 1  Comment tripping over dog - - - -  Number falls in past yr: - 1 0 - 0  Injury with Fall? 0 0 0 - -  Risk Factor Category  - - - - -  Risk for fall due to : Medication side effect Medication side effect No Fall Risks - -  Follow up Falls evaluation completed;Education provided Falls evaluation completed Falls evaluation completed - -    FALL RISK PREVENTION PERTAINING TO THE HOME:  Any stairs in or around the home? Yes  If so, are there any without handrails? No  Home free of loose throw rugs in walkways, pet beds, electrical cords, etc? Yes  Adequate lighting in your home to reduce risk of falls? Yes   ASSISTIVE DEVICES UTILIZED TO PREVENT FALLS:  Life alert? No  Use of a cane, walker or w/c? No  Grab bars in the bathroom? No  Shower chair or bench in shower? No  Elevated toilet seat or a handicapped toilet? No   TIMED UP AND GO:  Was the test performed? No .       Cognitive Function:     6CIT Screen 10/17/2021 10/15/2020  What Year? 0 points 0 points  What month? 0 points 0 points  What time? 0 points 0 points  Count back from 20 0 points 0 points  Months in reverse 0 points 0 points  Repeat phrase 0 points 0 points  Total Score 0 0    Immunizations Immunization History  Administered Date(s) Administered   Fluad Quad(high Dose 65+) 09/26/2019   Influenza, High Dose Seasonal PF 09/16/2018   Influenza,inj,Quad PF,6+ Mos 09/26/2016, 10/12/2017   Influenza-Unspecified 10/20/2015, 10/12/2017   PFIZER(Purple Top)SARS-COV-2 Vaccination 02/13/2020, 03/09/2020   Pneumococcal Conjugate-13 07/16/2020   Td 01/01/2006   Tdap 07/31/2016   Zoster Recombinat (Shingrix) 02/09/2021, 04/15/2021   Zoster, Live 07/10/2013    TDAP status: Up to date  Flu Vaccine status: Due, Education has been provided  regarding the importance of this vaccine. Advised may receive this vaccine at local pharmacy or Health Dept. Aware to provide a copy of the vaccination record if obtained from local pharmacy or Health Dept. Verbalized acceptance and understanding.  Pneumococcal vaccine status: Due, Education has been provided regarding the importance of this vaccine. Advised may receive this vaccine at local pharmacy or Health Dept. Aware to provide a copy of the vaccination record if obtained from local pharmacy or Health Dept. Verbalized acceptance and understanding.  Covid-19 vaccine status: Completed vaccines  Qualifies for Shingles Vaccine? Yes   Zostavax completed Yes   Shingrix Completed?: Yes  Screening Tests Health Maintenance  Topic Date Due   FOOT EXAM  Never done   Pneumonia Vaccine 33+ Years old (2 - PPSV23 if available, else PCV20) 07/16/2021   INFLUENZA VACCINE  07/18/2021   COVID-19 Vaccine (3 - Pfizer risk series) 11/02/2021 (Originally 04/06/2020)   HEMOGLOBIN A1C  11/30/2021   OPHTHALMOLOGY EXAM  03/22/2022   MAMMOGRAM  09/06/2023   TETANUS/TDAP  07/31/2026   COLONOSCOPY (Pts 45-40yr Insurance coverage will need to be confirmed)  11/28/2028   DEXA SCAN  Completed   Hepatitis C Screening  Completed   Zoster Vaccines- Shingrix  Completed   HPV VACCINES  Aged Out    Health Maintenance  Health Maintenance Due  Topic Date Due   FOOT EXAM  Never done   Pneumonia Vaccine 69 Years old (2 - PPSV23 if available, else PCV20) 07/16/2021  INFLUENZA VACCINE  07/18/2021    Colorectal cancer screening: Type of screening: Colonoscopy. Completed 11/28/2018. Repeat every 10 years  Mammogram status: Completed 09/05/2021. Repeat every year  Bone Density status: Completed 06/06/2011.   Lung Cancer Screening: (Low Dose CT Chest recommended if Age 43-80 years, 30 pack-year currently smoking OR have quit w/in 15years.) does not qualify.   Lung Cancer Screening Referral: no  Additional  Screening:  Hepatitis C Screening: does qualify; Completed 07/31/2016  Vision Screening: Recommended annual ophthalmology exams for early detection of glaucoma and other disorders of the eye. Is the patient up to date with their annual eye exam?  Yes  Who is the provider or what is the name of the office in which the patient attends annual eye exams? [facility] If pt is not established with a provider, would they like to be referred to a provider to establish care? No .   Dental Screening: Recommended annual dental exams for proper oral hygiene  Community Resource Referral / Chronic Care Management: CRR required this visit?  No   CCM required this visit?  No      Plan:     I have personally reviewed and noted the following in the patient's chart:   Medical and social history Use of alcohol, tobacco or illicit drugs  Current medications and supplements including opioid prescriptions.  Functional ability and status Nutritional status Physical activity Advanced directives List of other physicians Hospitalizations, surgeries, and ER visits in previous 12 months Vitals Screenings to include cognitive, depression, and falls Referrals and appointments  In addition, I have reviewed and discussed with patient certain preventive protocols, quality metrics, and best practice recommendations. A written personalized care plan for preventive services as well as general preventive health recommendations were provided to patient.     Kellie Simmering, LPN   [id number]   Nurse Notes: none

## 2021-10-24 ENCOUNTER — Encounter: Payer: Self-pay | Admitting: Internal Medicine

## 2021-10-24 ENCOUNTER — Ambulatory Visit: Payer: Medicare PPO | Admitting: Internal Medicine

## 2021-10-24 ENCOUNTER — Other Ambulatory Visit: Payer: Self-pay

## 2021-10-24 ENCOUNTER — Other Ambulatory Visit: Payer: Self-pay | Admitting: Internal Medicine

## 2021-10-24 VITALS — BP 102/67 | HR 83 | Temp 98.6°F | Ht 62.01 in | Wt 170.2 lb

## 2021-10-24 DIAGNOSIS — R059 Cough, unspecified: Secondary | ICD-10-CM | POA: Diagnosis not present

## 2021-10-24 DIAGNOSIS — J069 Acute upper respiratory infection, unspecified: Secondary | ICD-10-CM | POA: Diagnosis not present

## 2021-10-24 DIAGNOSIS — R0989 Other specified symptoms and signs involving the circulatory and respiratory systems: Secondary | ICD-10-CM | POA: Insufficient documentation

## 2021-10-24 LAB — VERITOR FLU A/B WAIVED
Influenza A: NEGATIVE
Influenza B: NEGATIVE

## 2021-10-24 MED ORDER — AMOXICILLIN-POT CLAVULANATE 500-125 MG PO TABS: 1.0000 | ORAL_TABLET | Freq: Two times a day (BID) | ORAL | 0 refills | Status: AC

## 2021-10-24 MED ORDER — ALBUTEROL SULFATE HFA 108 (90 BASE) MCG/ACT IN AERS: 2.0000 | INHALATION_SPRAY | Freq: Four times a day (QID) | RESPIRATORY_TRACT | 0 refills | Status: DC | PRN

## 2021-10-24 MED ORDER — BENZONATATE 100 MG PO CAPS: 100.0000 mg | ORAL_CAPSULE | Freq: Two times a day (BID) | ORAL | 0 refills | Status: DC | PRN

## 2021-10-24 NOTE — Telephone Encounter (Signed)
Requested Prescriptions  Refused Prescriptions Disp Refills  . albuterol (VENTOLIN HFA) 108 (90 Base) MCG/ACT inhaler [Pharmacy Med Name: ALBUTEROL HFA INH (200 PUFFS) 6.7GM] 20.1 g     Sig: INHALE 2 PUFFS INTO THE LUNGS EVERY 6 HOURS AS NEEDED FOR WHEEZING OR SHORTNESS OF BREATH     Pulmonology:  Beta Agonists Failed - 10/24/2021  3:27 PM      Failed - One inhaler should last at least one month. If the patient is requesting refills earlier, contact the patient to check for uncontrolled symptoms.      Passed - Valid encounter within last 12 months    Recent Outpatient Visits          Today Cough in adult   [facility] Vigg, Avanti, MD   2 weeks ago Fatigue, unspecified type   [facility] Vigg, Avanti, MD   1 month ago Essential hypertension   [place] Vigg, Avanti, MD   4 months ago Essential hypertension   [place] Vigg, Avanti, MD   9 months ago Depression, recurrent [facility])   [place], Jolene T, NP      Future Appointments            In 1 month Jonathon Bellows, MD [place]   In 11 months  [employer], [place]

## 2021-10-24 NOTE — Progress Notes (Signed)
Ht 5' 2.01" (1.575 m)   Wt 170 lb 3.2 oz (77.2 kg)   BMI 31.12 kg/m    Subjective:    Patient ID: Amanda Wang, female    DOB: [birth year], 70 y.o.   MRN: [id number]  Chief Complaint  Patient presents with   head congestion    Chest congestions, cough. All started on Friday 11/4    HPI: Amanda Wang is a 70 y.o. female  URI  This is a new problem. The current episode started in the past 7 days. The problem has been gradually worsening. There has been no fever. Associated symptoms include coughing, headaches, nausea and wheezing. Pertinent negatives include no abdominal pain, chest pain, congestion, diarrhea, dysuria, ear pain, joint pain, joint swelling, neck pain, plugged ear sensation, rash, rhinorrhea, sinus pain, sneezing, sore throat, swollen glands or vomiting. Associated symptoms comments: Wheezing @ night.   Chief Complaint  Patient presents with   head congestion    Chest congestions, cough. All started on Friday 11/4    Relevant past medical, surgical, family and social history reviewed and updated as indicated. Interim medical history since our last visit reviewed. Allergies and medications reviewed and updated.  Review of Systems  HENT:  Negative for congestion, ear pain, rhinorrhea, sinus pain, sneezing and sore throat.   Respiratory:  Positive for cough and wheezing.   Cardiovascular:  Negative for chest pain.  Gastrointestinal:  Positive for nausea. Negative for abdominal pain, diarrhea and vomiting.  Genitourinary:  Negative for dysuria.  Musculoskeletal:  Negative for joint pain and neck pain.  Skin:  Negative for rash.  Neurological:  Positive for headaches.   Per HPI unless specifically indicated above     Objective:    Ht 5' 2.01" (1.575 m)   Wt 170 lb 3.2 oz (77.2 kg)   BMI 31.12 kg/m   Wt Readings from Last 3 Encounters:  10/24/21 170 lb 3.2 oz (77.2 kg)  10/17/21 170 lb (77.1 kg)  10/13/21 175 lb 12.8 oz (79.7 kg)    Physical  Exam Vitals and nursing note reviewed.  Constitutional:      General: She is not in acute distress.    Appearance: Normal appearance. She is not ill-appearing or diaphoretic.  HENT:     Right Ear: Tympanic membrane normal. There is no impacted cerumen.     Left Ear: Tympanic membrane normal.  Eyes:     Conjunctiva/sclera: Conjunctivae normal.  Cardiovascular:     Rate and Rhythm: Normal rate and regular rhythm.  Pulmonary:     Breath sounds: No rhonchi.  Abdominal:     General: Abdomen is flat. Bowel sounds are normal. There is no distension.     Palpations: Abdomen is soft. There is no mass.     Tenderness: There is no abdominal tenderness. There is no guarding.  Skin:    General: Skin is warm and dry.     Coloration: Skin is not jaundiced.     Findings: No erythema.  Neurological:     Mental Status: She is alert.    Results for orders placed or performed in visit on 10/13/21  Urinalysis  Result Value Ref Range   Specific Gravity, UA 1.023 1.005 - 1.030   pH, UA 5.5 5.0 - 7.5   Color, UA Yellow Yellow   Appearance Ur Clear Clear   Leukocytes,UA Negative Negative   Protein,UA Negative Negative/Trace   Glucose, UA Negative Negative   Ketones, UA Negative Negative   RBC, UA  Negative Negative   Bilirubin, UA Negative Negative   Urobilinogen, Ur 0.2 0.2 - 1.0 mg/dL   Nitrite, UA Negative Negative  Celiac Disease Ab Screen w/Rfx  Result Value Ref Range   Antigliadin Abs, IgA 7 0 - 19 units   Transglutaminase IgA <2 0 - 3 U/mL   IgA/Immunoglobulin A, Serum 263 87 - 352 mg/dL  H. pylori breath test  Result Value Ref Range   H pylori Breath Test Negative Negative        Current Outpatient Medications:    albuterol (VENTOLIN HFA) 108 (90 Base) MCG/ACT inhaler, Inhale 2 puffs into the lungs every 6 (six) hours as needed for wheezing or shortness of breath., Disp: 8 g, Rfl: 0   amLODipine (NORVASC) 10 MG tablet, Take 10 mg by mouth daily., Disp: , Rfl:     amoxicillin-clavulanate (AUGMENTIN) 500-125 MG tablet, Take 1 tablet (500 mg total) by mouth 2 (two) times daily for 7 days., Disp: 14 tablet, Rfl: 0   atorvastatin (LIPITOR) 40 MG tablet, Take 1 tablet (40 mg total) by mouth daily at 6 PM. Need appt with PCP prior to refill, Disp: 90 tablet, Rfl: 1   benazepril (LOTENSIN) 20 MG tablet, Take 1 tablet (20 mg total) by mouth daily., Disp: 30 tablet, Rfl: 3   benzonatate (TESSALON) 100 MG capsule, Take 1 capsule (100 mg total) by mouth 2 (two) times daily as needed for cough., Disp: 20 capsule, Rfl: 0   Blood Glucose Monitoring Suppl (ONETOUCH VERIO) w/Device KIT, Use to check blood sugar 2-3 times daily and document -- bring to provider visits.  Goal is <130 fasting and <180 2 hours after eating., Disp: 1 kit, Rfl: 0   Charcoal Activated (ACTIVATED CHARCOAL PO), Take 2 capsules by mouth as needed., Disp: , Rfl:    cyclobenzaprine (FLEXERIL) 10 MG tablet, take 1 tablet by mouth three times a day if needed for muscle spasm, Disp: 90 tablet, Rfl: 1   Ferrous Sulfate (IRON) 325 (65 Fe) MG TABS, Take 1 tablet twice a day every other day., Disp: 15 tablet, Rfl: 1   gabapentin (NEURONTIN) 300 MG capsule, Take 1 capsule (300 mg total) by mouth 2 (two) times daily as needed (leg cramps)., Disp: 180 capsule, Rfl: 1   glucose blood (ONETOUCH VERIO) test strip, Use to check blood sugar 2-3 times daily and document -- bring to provider visits., Disp: 100 each, Rfl: 12   hydrochlorothiazide (HYDRODIURIL) 25 MG tablet, Take 1 tablet (25 mg total) by mouth daily., Disp: 90 tablet, Rfl: 4   Lancets (ONETOUCH DELICA PLUS [id number]) MISC, Use to check blood sugar 2-3 times daily and document -- bring to provider visits., Disp: 100 each, Rfl: 2   levETIRAcetam (KEPPRA) 500 MG tablet, TAKE 1 TABLET(500 MG) BY MOUTH DAILY, Disp: 90 tablet, Rfl: 1   LORazepam (ATIVAN) 1 MG tablet, Take 0.5-1 tablets (0.5-1 mg total) by mouth daily as needed for anxiety., Disp: 30 tablet, Rfl:  2   montelukast (SINGULAIR) 10 MG tablet, Take 1 tablet (10 mg total) by mouth at bedtime., Disp: 90 tablet, Rfl: 1   Vilazodone HCl (VIIBRYD) 40 MG TABS, Take 1 tablet (40 mg total) by mouth daily., Disp: 90 tablet, Rfl: 0    Assessment & Plan:  URI: COVID , Flu and strep ordered at this visit, both negative. pt advised to take Tylenol q 4- 6 hourly as needed. pt to take allegra q pm as needed and to call office if symptoms worsened pt verbalised  understanding of such.    Problem List Items Addressed This Visit       Respiratory   Chest congestion   Relevant Orders   Novel Coronavirus, NAA ([facility])   Veritor Flu A/B Waived     Other   Cough in adult - Primary   Relevant Orders   Novel Coronavirus, NAA ([facility])   Veritor Flu A/B Waived     Orders Placed This Encounter  Procedures   Novel Coronavirus, NAA ([facility])   Veritor Flu A/B Waived     Meds ordered this encounter  Medications   albuterol (VENTOLIN HFA) 108 (90 Base) MCG/ACT inhaler    Sig: Inhale 2 puffs into the lungs every 6 (six) hours as needed for wheezing or shortness of breath.    Dispense:  8 g    Refill:  0   amoxicillin-clavulanate (AUGMENTIN) 500-125 MG tablet    Sig: Take 1 tablet (500 mg total) by mouth 2 (two) times daily for 7 days.    Dispense:  14 tablet    Refill:  0   benzonatate (TESSALON) 100 MG capsule    Sig: Take 1 capsule (100 mg total) by mouth 2 (two) times daily as needed for cough.    Dispense:  20 capsule    Refill:  0     Follow up plan: No follow-ups on file.

## 2021-10-24 NOTE — Progress Notes (Signed)
Please let pt know this was normal.

## 2021-10-25 LAB — NOVEL CORONAVIRUS, NAA: SARS-CoV-2, NAA: NOT DETECTED

## 2021-10-25 LAB — SARS-COV-2, NAA 2 DAY TAT

## 2021-10-27 ENCOUNTER — Encounter
Admission: RE | Disposition: A | Discharge status: Home / Self Care | Payer: Self-pay | Source: Home / Self Care | Attending: Gastroenterology

## 2021-10-27 ENCOUNTER — Encounter: Payer: Self-pay | Admitting: Gastroenterology

## 2021-10-27 ENCOUNTER — Ambulatory Visit
Admission: RE | Admit: 2021-10-27 | Discharge: 2021-10-27 | Disposition: A | Discharge status: Home / Self Care | Payer: Medicare PPO | Attending: Gastroenterology | Admitting: Gastroenterology

## 2021-10-27 ENCOUNTER — Ambulatory Visit: Payer: Medicare PPO | Admitting: Anesthesiology

## 2021-10-27 DIAGNOSIS — I10 Essential (primary) hypertension: Secondary | ICD-10-CM | POA: Diagnosis not present

## 2021-10-27 DIAGNOSIS — F419 Anxiety disorder, unspecified: Secondary | ICD-10-CM | POA: Diagnosis not present

## 2021-10-27 DIAGNOSIS — D509 Iron deficiency anemia, unspecified: Secondary | ICD-10-CM | POA: Diagnosis not present

## 2021-10-27 DIAGNOSIS — K317 Polyp of stomach and duodenum: Secondary | ICD-10-CM | POA: Diagnosis not present

## 2021-10-27 DIAGNOSIS — E119 Type 2 diabetes mellitus without complications: Secondary | ICD-10-CM | POA: Insufficient documentation

## 2021-10-27 DIAGNOSIS — K219 Gastro-esophageal reflux disease without esophagitis: Secondary | ICD-10-CM | POA: Insufficient documentation

## 2021-10-27 DIAGNOSIS — Z87891 Personal history of nicotine dependence: Secondary | ICD-10-CM | POA: Insufficient documentation

## 2021-10-27 DIAGNOSIS — D508 Other iron deficiency anemias: Secondary | ICD-10-CM | POA: Diagnosis not present

## 2021-10-27 HISTORY — PX: ESOPHAGOGASTRODUODENOSCOPY: SHX5428

## 2021-10-27 HISTORY — DX: Other specified postprocedural states: Z98.890

## 2021-10-27 HISTORY — DX: Nausea with vomiting, unspecified: R11.2

## 2021-10-27 HISTORY — PX: COLONOSCOPY WITH PROPOFOL: SHX5780

## 2021-10-27 LAB — GLUCOSE, CAPILLARY: Glucose-Capillary: 109 mg/dL — ABNORMAL HIGH (ref 70–99)

## 2021-10-27 SURGERY — COLONOSCOPY WITH PROPOFOL
Anesthesia: General

## 2021-10-27 MED ORDER — SODIUM CHLORIDE 0.9 % IV SOLN: INTRAVENOUS | Status: DC

## 2021-10-27 MED ORDER — PROPOFOL 10 MG/ML IV BOLUS
INTRAVENOUS | Status: DC | PRN
  Administered 2021-10-27: 70 mg via INTRAVENOUS

## 2021-10-27 MED ORDER — SODIUM CHLORIDE (PF) 0.9 % IJ SOLN
INTRAMUSCULAR | Status: DC | PRN
  Administered 2021-10-27: 3 mL via INTRAVENOUS

## 2021-10-27 MED ORDER — PROPOFOL 500 MG/50ML IV EMUL
INTRAVENOUS | Status: DC | PRN
  Administered 2021-10-27: 140 ug/kg/min via INTRAVENOUS

## 2021-10-27 MED ORDER — LIDOCAINE HCL (CARDIAC) PF 100 MG/5ML IV SOSY
PREFILLED_SYRINGE | INTRAVENOUS | Status: DC | PRN
  Administered 2021-10-27: 60 mg via INTRAVENOUS

## 2021-10-27 NOTE — Op Note (Signed)
[facility] Gastroenterology Patient Name: Amanda Wang Procedure Date: 10/27/2021 8:55 AM MRN: [id number] Account #: [id number] Date of Birth: September 10, 1951 Admit Type: Outpatient Age: 70 Room: [facility] ENDO ROOM 3 Gender: Female Note Status: Finalized Instrument Name: Upper Endoscope [id number] Procedure:             Upper GI endoscopy Indications:           Iron deficiency anemia Providers:             Jonathon Bellows MD, MD Referring MD:          Charlynne Cousins (Referring MD) Medicines:             Monitored Anesthesia Care Complications:         No immediate complications. Procedure:             Pre-Anesthesia Assessment:                        - Prior to the procedure, a History and Physical was                         performed, and patient medications, allergies and                         sensitivities were reviewed. The patient's tolerance                         of previous anesthesia was reviewed.                        - The risks and benefits of the procedure and the                         sedation options and risks were discussed with the                         patient. All questions were answered and informed                         consent was obtained.                        - ASA Grade Assessment: II - A patient with mild                         systemic disease.                        After obtaining informed consent, the endoscope was                         passed under direct vision. Throughout the procedure,                         the patient's blood pressure, pulse, and oxygen                         saturations were monitored continuously. The Endoscope                         was introduced  through the mouth, and advanced to the                         third part of duodenum. The upper GI endoscopy was                         accomplished with ease. The patient tolerated the                         procedure well. Findings:      The esophagus was  normal.      The examined duodenum was normal.      A deformity was found in the gastric fundus. Unclear type of surgery ,       widely patient GE junction      Multiple 5 to 10 mm sessile polyps with no bleeding and no stigmata of       recent bleeding were found on the greater curvature of the stomach. The       polyp was removed with a saline injection-lift technique using a hot       snare. Resection and retrieval were complete. To prevent bleeding after       the polypectomy, two hemostatic clips were successfully placed. There       was no bleeding during, or at the end, of the procedure. Impression:            - Normal esophagus.                        - Normal examined duodenum.                        - Post-surgical deformity in the gastric fundus.                        - Multiple gastric polyps. Resected and retrieved.                         Clips were placed. Recommendation:        - Await pathology results.                        - Perform a colonoscopy today. Procedure Code(s):     --- Professional ---                        [id number], Esophagogastroduodenoscopy, flexible,                         transoral; with removal of tumor(s), polyp(s), or                         other lesion(s) by snare technique                        [id number], Esophagogastroduodenoscopy, flexible,                         transoral; with directed submucosal injection(s), any                         substance Diagnosis Code(s):     ---  Professional ---                        K91.89, Other postprocedural complications and                         disorders of digestive system                        K31.7, Polyp of stomach and duodenum                        D50.9, Iron deficiency anemia, unspecified CPT copyright 2019 American Medical Association. All rights reserved. The codes documented in this report are preliminary and upon coder review may  be revised to meet current compliance requirements. Jonathon Bellows, MD Jonathon Bellows MD, MD 10/27/2021 9:12:16 AM This report has been signed electronically. Number of Addenda: 0 Note Initiated On: 10/27/2021 8:55 AM Estimated Blood Loss:  Estimated blood loss: none.      [facility]

## 2021-10-27 NOTE — H&P (Signed)
Jonathon Bellows, MD [address] [place], [place], [place], [address] [address], [place], [place], [place], [address] Phone: [phone]  Fax: [phone]  Primary Care Physician:  Charlynne Cousins, MD   Pre-Procedure History & Physical: HPI:  Amanda Wang is a 70 y.o. female is here for an endoscopy and colonoscopy    Past Medical History:  Diagnosis Date   Advanced care planning/counseling discussion 09/16/2018   Arthritis    Chiari malformation type I ([place])    Depression    Diabetes mellitus without complication ([place])    GERD (gastroesophageal reflux disease)    History of hiatal hernia    Hyperlipidemia    Hypertension    PONV (postoperative nausea and vomiting)    Sleep apnea    Small intestinal bacterial overgrowth    Spinal stenosis    Vaccine reaction, initial encounter 03/11/2020    Past Surgical History:  Procedure Laterality Date   BREAST BIOPSY Right    neg   chiari malformation I decompression surgery  11-06   COLONOSCOPY WITH PROPOFOL N/A 11/28/2018   Procedure: COLONOSCOPY WITH PROPOFOL;  Surgeon: Jonathon Bellows, MD;  Location: [facility] ENDOSCOPY;  Service: Gastroenterology;  Laterality: N/A;   deviated septum repair     HERNIA REPAIR      Prior to Admission medications   Medication Sig Start Date End Date Taking? Authorizing Provider  amLODipine (NORVASC) 10 MG tablet Take 10 mg by mouth daily.   Yes [provider]  atorvastatin (LIPITOR) 40 MG tablet Take 1 tablet (40 mg total) by mouth daily at 6 PM. Need appt with PCP prior to refill 10/06/21  Yes Vigg, Avanti, MD  benazepril (LOTENSIN) 20 MG tablet Take 1 tablet (20 mg total) by mouth daily. 10/06/21  Yes Vigg, Avanti, MD  levETIRAcetam (KEPPRA) 500 MG tablet TAKE 1 TABLET(500 MG) BY MOUTH DAILY 07/07/21  Yes Vigg, Avanti, MD  albuterol (VENTOLIN HFA) 108 (90 Base) MCG/ACT inhaler Inhale 2 puffs into the lungs every 6 (six) hours as needed for wheezing or shortness of breath. 10/24/21    Vigg, Avanti, MD  amoxicillin-clavulanate (AUGMENTIN) 500-125 MG tablet Take 1 tablet (500 mg total) by mouth 2 (two) times daily for 7 days. 10/24/21 10/31/21  Vigg, Loman [place], MD  benzonatate (TESSALON) 100 MG capsule Take 1 capsule (100 mg total) by mouth 2 (two) times daily as needed for cough. 10/24/21   Vigg, Avanti, MD  Blood Glucose Monitoring Suppl (ONETOUCH VERIO) w/Device KIT Use to check blood sugar 2-3 times daily and document -- bring to provider visits.  Goal is <130 fasting and <180 2 hours after eating. 02/04/21   Cannady, Henrine Screws T, NP  Charcoal Activated (ACTIVATED CHARCOAL PO) Take 2 capsules by mouth as needed.    [provider]  cyclobenzaprine (FLEXERIL) 10 MG tablet take 1 tablet by mouth three times a day if needed for muscle spasm 07/05/21   Vigg, Avanti, MD  Ferrous Sulfate (IRON) 325 (65 Fe) MG TABS Take 1 tablet twice a day every other day. 10/13/21   Jonathon Bellows, MD  gabapentin (NEURONTIN) 300 MG capsule Take 1 capsule (300 mg total) by mouth 2 (two) times daily as needed (leg cramps). 05/31/21   Vigg, Avanti, MD  glucose blood (ONETOUCH VERIO) test strip Use to check blood sugar 2-3 times daily and document -- bring to provider visits. 02/04/21   Cannady, Henrine Screws T, NP  hydrochlorothiazide (HYDRODIURIL) 25 MG tablet Take 1 tablet (25 mg total) by mouth  daily. Patient not taking: Reported on 10/27/2021 01/21/21   Marnee Guarneri T, NP  Lancets [facility] DELICA PLUS [id number]) MISC Use to check blood sugar 2-3 times daily and document -- bring to provider visits. 02/04/21   Cannady, Henrine Screws T, NP  LORazepam (ATIVAN) 1 MG tablet Take 0.5-1 tablets (0.5-1 mg total) by mouth daily as needed for anxiety. 05/31/21   Vigg, Avanti, MD  montelukast (SINGULAIR) 10 MG tablet Take 1 tablet (10 mg total) by mouth at bedtime. 05/31/21   Vigg, Avanti, MD  Vilazodone HCl (VIIBRYD) 40 MG TABS Take 1 tablet (40 mg total) by mouth daily. 09/14/21   Charlynne Cousins, MD    Allergies as of 10/13/2021 -  Review Complete 10/07/2021  Allergen Reaction Noted   Fexofenadine Anxiety 11/04/2016   Codeine Nausea Only 07/15/2015   Methylphenidate hcl  07/24/2018   Zoloft [sertraline] Other (See Comments) 07/15/2015   Magnevist [gadopentetate] Other (See Comments) 07/27/2015   Sulfa antibiotics Rash 07/15/2015    Family History  Problem Relation Age of Onset   Hypertension Mother    Cancer Mother    Stroke Mother    Heart disease Father    Breast cancer Sister 19   Breast cancer Paternal Aunt    Diabetes Paternal Grandmother     Social History   Socioeconomic History   Marital status: Divorced    Spouse name: Not on file   Number of children: Not on file   Years of education: Not on file   Highest education level: Bachelor's degree (e.g., BA, AB, BS)  Occupational History   Occupation: part time   Tobacco Use   Smoking status: Former    Packs/day: 0.50    Years: 1.00    Pack years: 0.50    Types: Cigarettes    Quit date: 07/26/1977    Years since quitting: 44.2   Smokeless tobacco: Never   Tobacco comments:    barley smoked when smoked   Vaping Use   Vaping Use: Never used  Substance and Sexual Activity   Alcohol use: Yes    Alcohol/week: 0.0 standard drinks    Comment: on occasion 1-2 x month   Drug use: No   Sexual activity: Not on file  Other Topics Concern   Not on file  Social History Narrative   Not on file   Social Determinants of Health   Financial Resource Strain: Low Risk    Difficulty of Paying Living Expenses: Not hard at all  Food Insecurity: No Food Insecurity   Worried About Charity fundraiser in the Last Year: Never true   [place] in the Last Year: Never true  Transportation Needs: No Transportation Needs   Lack of Transportation (Medical): No   Lack of Transportation (Non-Medical): No  Physical Activity: Inactive   Days of Exercise per Week: 0 days   Minutes of Exercise per Session: 0 min  Stress: Stress Concern Present   Feeling of  Stress : To some extent  Social Connections: Not on file  Intimate Partner Violence: Not on file    Review of Systems: See HPI, otherwise negative ROS  Physical Exam: BP 139/76   Pulse 84   Temp 97.6 F (36.4 C) (Temporal)   Resp 16   Ht _0  (1.575 m)   Wt 77.1 kg   SpO2 100%   BMI 31.09 kg/m  General:   Alert,  pleasant and cooperative in NAD Head:  Normocephalic and atraumatic. Neck:  Supple; no  masses or thyromegaly. Lungs:  Clear throughout to auscultation, normal respiratory effort.    Heart:  +S1, +S2, Regular rate and rhythm, No edema. Abdomen:  Soft, nontender and nondistended. Normal bowel sounds, without guarding, and without rebound.   Neurologic:  Alert and  oriented x4;  grossly normal neurologically.  Impression/Plan: Amanda Wang is here for an endoscopy and colonoscopy  to be performed for  evaluation of iron deficiency anemia    Risks, benefits, limitations, and alternatives regarding endoscopy have been reviewed with the patient.  Questions have been answered.  All parties agreeable.   Jonathon Bellows, MD  10/27/2021, 8:48 AM

## 2021-10-27 NOTE — Op Note (Signed)
[facility] Gastroenterology Patient Name: Amanda Wang Procedure Date: 10/27/2021 8:53 AM MRN: [id number] Account #: [id number] Date of Birth: 13-Aug-1951 Admit Type: Outpatient Age: 70 Room: [facility] ENDO ROOM 3 Gender: Female Note Status: Finalized Instrument Name: Park Meo [id number] Procedure:             Colonoscopy Indications:           Iron deficiency anemia Providers:             Jonathon Bellows MD, MD Referring MD:          Charlynne Cousins (Referring MD) Medicines:             Monitored Anesthesia Care Complications:         No immediate complications. Procedure:             Pre-Anesthesia Assessment:                        - Prior to the procedure, a History and Physical was                         performed, and patient medications, allergies and                         sensitivities were reviewed. The patient's tolerance                         of previous anesthesia was reviewed.                        - The risks and benefits of the procedure and the                         sedation options and risks were discussed with the                         patient. All questions were answered and informed                         consent was obtained.                        - ASA Grade Assessment: II - A patient with mild                         systemic disease.                        - ASA Grade Assessment: II - A patient with mild                         systemic disease.                        After obtaining informed consent, the colonoscope was                         passed under direct vision. Throughout the procedure,                         the patient's blood pressure, pulse, and oxygen  saturations were monitored continuously. The                         Colonoscope was introduced through the anus and                         advanced to the the cecum, identified by the                         appendiceal orifice. The colonoscopy was  performed                         with ease. The patient tolerated the procedure well.                         The quality of the bowel preparation was good. Findings:      The perianal and digital rectal examinations were normal.      The entire examined colon appeared normal on direct and retroflexion       views. Impression:            - The entire examined colon is normal on direct and                         retroflexion views.                        - No specimens collected. Recommendation:        - Discharge patient to home (with escort).                        - Resume previous diet.                        - Continue present medications.                        - Repeat colonoscopy is not recommended due to current                         age (34 years or older) for screening purposes. Procedure Code(s):     --- Professional ---                        [phone], Colonoscopy, flexible; diagnostic, including                         collection of specimen(s) by brushing or washing, when                         performed (separate procedure) Diagnosis Code(s):     --- Professional ---                        D50.9, Iron deficiency anemia, unspecified CPT copyright 2019 American Medical Association. All rights reserved. The codes documented in this report are preliminary and upon coder review may  be revised to meet current compliance requirements. Jonathon Bellows, MD Jonathon Bellows MD, MD 10/27/2021 9:30:22 AM This report has been signed electronically. Number of Addenda: 0 Note Initiated On: 10/27/2021 8:53 AM Scope Withdrawal Time: 0  hours 7 minutes 27 seconds  Total Procedure Duration: 0 hours 12 minutes 32 seconds  Estimated Blood Loss:  Estimated blood loss: none.      [facility]

## 2021-10-27 NOTE — Anesthesia Preprocedure Evaluation (Signed)
Anesthesia Evaluation  Patient identified by MRN, date of birth, ID band Patient awake    Reviewed: Allergy & Precautions, NPO status , Patient's Chart, lab work & pertinent test results  History of Anesthesia Complications (+) PONV and history of anesthetic complications  Airway Mallampati: II  TM Distance: >3 FB Neck ROM: Full    Dental  (+) Chipped   Pulmonary neg sleep apnea, neg COPD, former smoker,    Pulmonary exam normal        Cardiovascular hypertension, Pt. on medications (-) CAD, (-) Past MI, (-) Cardiac Stents and (-) CABG Normal cardiovascular exam - Systolic murmurs    Neuro/Psych neg Seizures PSYCHIATRIC DISORDERS Anxiety Depression negative neurological ROS     GI/Hepatic Neg liver ROS, hiatal hernia, GERD  ,  Endo/Other  diabetes  Renal/GU negative Renal ROS     Musculoskeletal  (+) Arthritis ,   Abdominal (+) - obese,   Peds  Hematology negative hematology ROS (+)   Anesthesia Other Findings Past Medical History: No date: Arthritis No date: Chiari malformation type I ([facility]) No date: Depression No date: GERD (gastroesophageal reflux disease) No date: History of hiatal hernia No date: Hyperlipidemia No date: Hypertension No date: Sleep apnea No date: Spinal stenosis   Reproductive/Obstetrics                             Anesthesia Physical  Anesthesia Plan  ASA: 3  Anesthesia Plan: General   Post-op Pain Management:    Induction: Intravenous  PONV Risk Score and Plan: 2 and Propofol infusion and TIVA  Airway Management Planned: Natural Airway  Additional Equipment:   Intra-op Plan:   Post-operative Plan:   Informed Consent: I have reviewed the patients History and Physical, chart, labs and discussed the procedure including the risks, benefits and alternatives for the proposed anesthesia with the patient or authorized representative who has indicated  his/her understanding and acceptance.     Dental advisory given  Plan Discussed with: CRNA and Anesthesiologist  Anesthesia Plan Comments: (Patient consented for risks of anesthesia including but not limited to:  - adverse reactions to medications - risk of airway placement if required - damage to eyes, teeth, lips or other oral mucosa - nerve damage due to positioning  - sore throat or hoarseness - Damage to heart, brain, nerves, lungs, other parts of body or loss of life  Patient voiced understanding.)        Anesthesia Quick Evaluation

## 2021-10-27 NOTE — Transfer of Care (Signed)
Immediate Anesthesia Transfer of Care Note  Patient: Amanda Wang  Procedure(s) Performed: COLONOSCOPY WITH PROPOFOL ESOPHAGOGASTRODUODENOSCOPY (EGD)  Patient Location: Endoscopy Unit  Anesthesia Type:General  Level of Consciousness: drowsy  Airway & Oxygen Therapy: Patient Spontanous Breathing  Post-op Assessment: Report given to RN and Post -op Vital signs reviewed and stable  Post vital signs: Reviewed and stable  Last Vitals:  Vitals Value Taken Time  BP 107/58 10/27/21 0933  Temp 36 C 10/27/21 0933  Pulse 74 10/27/21 0934  Resp 24 10/27/21 0934  SpO2 96 % 10/27/21 0934  Vitals shown include unvalidated device data.  Last Pain:  Vitals:   10/27/21 0933  TempSrc: Temporal  PainSc:          Complications: No notable events documented.

## 2021-10-27 NOTE — Anesthesia Postprocedure Evaluation (Signed)
Anesthesia Post Note  Patient: Amanda Wang  Procedure(s) Performed: COLONOSCOPY WITH PROPOFOL ESOPHAGOGASTRODUODENOSCOPY (EGD)  Patient location during evaluation: Endoscopy Anesthesia Type: General Level of consciousness: awake and alert Pain management: pain level controlled Vital Signs Assessment: post-procedure vital signs reviewed and stable Respiratory status: spontaneous breathing, nonlabored ventilation, respiratory function stable and patient connected to nasal cannula oxygen Cardiovascular status: blood pressure returned to baseline and stable Postop Assessment: no apparent nausea or vomiting Anesthetic complications: no   No notable events documented.   Last Vitals:  Vitals:   10/27/21 0943 10/27/21 0953  BP: 135/76 121/77  Pulse: 76 66  Resp: 12 14  Temp:    SpO2: 100% 99%    Last Pain:  Vitals:   10/27/21 0953  TempSrc:   PainSc: 0-No pain                 Precious Haws Ysmael Hires

## 2021-10-28 ENCOUNTER — Encounter: Payer: Self-pay | Admitting: Gastroenterology

## 2021-10-28 LAB — SURGICAL PATHOLOGY

## 2021-10-31 ENCOUNTER — Encounter: Payer: Self-pay | Admitting: Gastroenterology

## 2021-11-16 ENCOUNTER — Other Ambulatory Visit: Payer: Self-pay | Admitting: Nurse Practitioner

## 2021-11-18 NOTE — Telephone Encounter (Signed)
Requested Prescriptions  Pending Prescriptions Disp Refills  . Accu-Chek Softclix Lancets lancets [Pharmacy Med Name: SOFTCLIX LANCETS] 100 each 2    Sig: TEST TWO TO THREE TIMES DAILY     Endocrinology: Diabetes - Testing Supplies Passed - 11/16/2021  2:39 PM      Passed - Valid encounter within last 12 months    Recent Outpatient Visits          3 weeks ago Cough in adult   [facility] Vigg, Avanti, MD   1 month ago Fatigue, unspecified type   [facility] Vigg, Avanti, MD   2 months ago Essential hypertension   [place], Avanti, MD   5 months ago Essential hypertension   [place] Vigg, Avanti, MD   10 months ago Depression, recurrent ([place])   [place], Jolene T, NP      Future Appointments            In 3 weeks Jonathon Bellows, MD [place]   In 11 months  [employer], [place]

## 2021-11-22 ENCOUNTER — Other Ambulatory Visit: Payer: Self-pay | Admitting: Internal Medicine

## 2021-11-22 NOTE — Telephone Encounter (Signed)
Requested Prescriptions  Pending Prescriptions Disp Refills  . albuterol (VENTOLIN HFA) 108 (90 Base) MCG/ACT inhaler [Pharmacy Med Name: ALBUTEROL HFA INH (200 PUFFS) 6.7GM] 6.7 g 0    Sig: INHALE 2 PUFFS INTO THE LUNGS EVERY 6 HOURS AS NEEDED FOR WHEEZING OR SHORTNESS OF BREATH     Pulmonology:  Beta Agonists Failed - 11/22/2021  3:10 AM      Failed - One inhaler should last at least one month. If the patient is requesting refills earlier, contact the patient to check for uncontrolled symptoms.      Passed - Valid encounter within last 12 months    Recent Outpatient Visits          4 weeks ago Cough in adult   [facility] Vigg, Avanti, MD   1 month ago Fatigue, unspecified type   [facility] Vigg, Avanti, MD   2 months ago Essential hypertension   [place], MD   5 months ago Essential hypertension   [place] Vigg, Avanti, MD   10 months ago Depression, recurrent [facility])   [place], Jolene T, NP      Future Appointments            In 3 weeks Jonathon Bellows, MD [place]   In 11 months  [employer], [place]

## 2021-11-22 NOTE — Telephone Encounter (Signed)
Requested medication (s) are due for refill today: requesting 90 day supply  Requested medication (s) are on the active medication list: yes   Last refill:  11/22/21 6.7g 0 refills  Future visit scheduled: yes   Notes to clinic:  requesting 90 day supply .      Requested Prescriptions  Pending Prescriptions Disp Refills   albuterol (VENTOLIN HFA) 108 (90 Base) MCG/ACT inhaler [Pharmacy Med Name: ALBUTEROL HFA INH (200 PUFFS) 6.7GM] 20.1 g     Sig: INHALE 2 PUFFS INTO THE LUNGS EVERY 6 HOURS AS NEEDED FOR WHEEZING OR SHORTNESS OF BREATH     Pulmonology:  Beta Agonists Failed - 11/22/2021  7:16 AM      Failed - One inhaler should last at least one month. If the patient is requesting refills earlier, contact the patient to check for uncontrolled symptoms.      Passed - Valid encounter within last 12 months    Recent Outpatient Visits           4 weeks ago Cough in adult   [facility] Vigg, Avanti, MD   1 month ago Fatigue, unspecified type   [facility] Vigg, Avanti, MD   2 months ago Essential hypertension   [place], MD   5 months ago Essential hypertension   [place] Vigg, Avanti, MD   10 months ago Depression, recurrent [facility])   [place], Jolene T, NP       Future Appointments             In 3 weeks Jonathon Bellows, MD [place]   In 11 months  [employer], [place]

## 2021-11-27 DIAGNOSIS — S8392XA Sprain of unspecified site of left knee, initial encounter: Secondary | ICD-10-CM | POA: Diagnosis not present

## 2021-12-07 DIAGNOSIS — M2392 Unspecified internal derangement of left knee: Secondary | ICD-10-CM | POA: Diagnosis not present

## 2021-12-07 DIAGNOSIS — M25562 Pain in left knee: Secondary | ICD-10-CM | POA: Diagnosis not present

## 2021-12-14 ENCOUNTER — Other Ambulatory Visit: Payer: Self-pay | Admitting: Internal Medicine

## 2021-12-14 DIAGNOSIS — M2392 Unspecified internal derangement of left knee: Secondary | ICD-10-CM | POA: Diagnosis not present

## 2021-12-14 DIAGNOSIS — I10 Essential (primary) hypertension: Secondary | ICD-10-CM

## 2021-12-14 NOTE — Telephone Encounter (Signed)
Interface surescripts request. Discontinued medication 09/12/21 .  Requested Prescriptions  Refused Prescriptions Disp Refills   amLODipine (NORVASC) 5 MG tablet [Pharmacy Med Name: AMLODIPINE BESYLATE 5MG  TABLETS] 90 tablet 1    Sig: TAKE 1 TABLET(5 MG) BY MOUTH DAILY     Cardiovascular:  Calcium Channel Blockers Passed - 12/14/2021 10:24 AM      Passed - Last BP in normal range    BP Readings from Last 1 Encounters:  10/27/21 121/77         Passed - Valid encounter within last 6 months    Recent Outpatient Visits          1 month ago Cough in adult   [facility] Vigg, Avanti, MD   2 months ago Fatigue, unspecified type   [facility] Vigg, Avanti, MD   3 months ago Essential hypertension   [place], Avanti, MD   6 months ago Essential hypertension   [place] Vigg, Avanti, MD   11 months ago Depression, recurrent ([place])   [place], Barbaraann Faster, NP      Future Appointments            Tomorrow Jonathon Bellows, MD [place]   In 10 months  [facility]

## 2021-12-15 ENCOUNTER — Ambulatory Visit: Payer: Medicare PPO | Admitting: Gastroenterology

## 2021-12-15 NOTE — Progress Notes (Deleted)
Jonathon Bellows MD, MRCP(U.K) [address]  [place]  [place], [place] [address]  Main: [phone]  Fax: [phone]   Primary Care Physician: Charlynne Cousins, MD  Primary Gastroenterologist:  Dr. Jonathon Bellows   C/c : Follow up iron deficiency anemia    HPI: Amanda Wang is a 70 y.o. female   Summary of history :   She is here today to follow-up for IBS-D. Her initial visit  was seen on 11/07/2018 referred for diarrhea.  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  She has over 3 bowel movements a day.  Watery in nature.  She has taken Imodium occasionally.  No real change of weight.  Consumed 2 cans of soda a day occasional sweet tea no hard candy or chewing gum.  Is afraid to pass gas that she thought she would have leakage.   An initial round of Xifaxan did work and most of her symptoms had settled . Once she completed it and went off the medications,  , feels like a lot of bubbles in her abdomen that bubbles,when she has a bowel movement "like a fire works " with "pops   Labs 08/2018 : CBC, CMP,TSH,copper, vitamin E,Zinc , vitamin A,iron studies-normal , 11/10/2018: Fecal calprotectin elevated at 243.  Stool studies PCR was negative.  Stool testing for C. difficile was negative.  Celiac serology negative.    11/28/18 : colonoscopy :Normal TI and random colon biopsies .      12/2018 : Hydrogen breath test was positive treated with xifaxan : 4 week course   Interval history 10/13/2021-12/15/2021   She has been presently referred for iron deficiency anemia and seen in 09/2021 No overt blood loss or red flag signs.   10/27/2021: EGD:  Multiple gastric polyps resected: Hyperplastic . Colonoscopy : Normal   10/13/2021: Celiac serology , UA, H pylori breath test negative  Current Outpatient Medications  Medication Sig Dispense Refill   Accu-Chek Softclix Lancets lancets TEST TWO TO THREE TIMES DAILY 100 each 2   albuterol (VENTOLIN HFA) 108 (90 Base)  MCG/ACT inhaler INHALE 2 PUFFS INTO THE LUNGS EVERY 6 HOURS AS NEEDED FOR WHEEZING OR SHORTNESS OF BREATH 6.7 g 0   amLODipine (NORVASC) 10 MG tablet Take 10 mg by mouth daily.     atorvastatin (LIPITOR) 40 MG tablet Take 1 tablet (40 mg total) by mouth daily at 6 PM. Need appt with PCP prior to refill 90 tablet 1   benazepril (LOTENSIN) 20 MG tablet Take 1 tablet (20 mg total) by mouth daily. 30 tablet 3   benzonatate (TESSALON) 100 MG capsule Take 1 capsule (100 mg total) by mouth 2 (two) times daily as needed for cough. 20 capsule 0   Blood Glucose Monitoring Suppl (ONETOUCH VERIO) w/Device KIT Use to check blood sugar 2-3 times daily and document -- bring to provider visits.  Goal is <130 fasting and <180 2 hours after eating. 1 kit 0   Charcoal Activated (ACTIVATED CHARCOAL PO) Take 2 capsules by mouth as needed.     cyclobenzaprine (FLEXERIL) 10 MG tablet take 1 tablet by mouth three times a day if needed for muscle spasm 90 tablet 1   Ferrous Sulfate (IRON) 325 (65 Fe) MG TABS Take 1 tablet twice a day every other day. 15 tablet 1   gabapentin (NEURONTIN) 300 MG capsule Take 1 capsule (300 mg total) by mouth 2 (two) times daily as needed (leg cramps). 180 capsule 1  glucose blood (ONETOUCH VERIO) test strip Use to check blood sugar 2-3 times daily and document -- bring to provider visits. 100 each 12   hydrochlorothiazide (HYDRODIURIL) 25 MG tablet Take 1 tablet (25 mg total) by mouth daily. (Patient not taking: Reported on 10/27/2021) 90 tablet 4   levETIRAcetam (KEPPRA) 500 MG tablet TAKE 1 TABLET(500 MG) BY MOUTH DAILY 90 tablet 1   LORazepam (ATIVAN) 1 MG tablet Take 0.5-1 tablets (0.5-1 mg total) by mouth daily as needed for anxiety. 30 tablet 2   montelukast (SINGULAIR) 10 MG tablet Take 1 tablet (10 mg total) by mouth at bedtime. 90 tablet 1   Vilazodone HCl (VIIBRYD) 40 MG TABS Take 1 tablet (40 mg total) by mouth daily. 90 tablet 0   No current facility-administered medications for  this visit.    Allergies as of 12/15/2021 - Review Complete 10/27/2021  Allergen Reaction Noted   Fexofenadine Anxiety 11/04/2016   Codeine Nausea Only 07/15/2015   Methylphenidate hcl  07/24/2018   Zoloft [sertraline] Other (See Comments) 07/15/2015   Magnevist [gadopentetate] Other (See Comments) 07/27/2015   Sulfa antibiotics Rash 07/15/2015    ROS:  General: Negative for anorexia, weight loss, fever, chills, fatigue, weakness. ENT: Negative for hoarseness, difficulty swallowing , nasal congestion. CV: Negative for chest pain, angina, palpitations, dyspnea on exertion, peripheral edema.  Respiratory: Negative for dyspnea at rest, dyspnea on exertion, cough, sputum, wheezing.  GI: See history of present illness. GU:  Negative for dysuria, hematuria, urinary incontinence, urinary frequency, nocturnal urination.  Endo: Negative for unusual weight change.    Physical Examination:   There were no vitals taken for this visit.  General: Well-nourished, well-developed in no acute distress.  Eyes: No icterus. Conjunctivae pink. Mouth: Oropharyngeal mucosa moist and pink , no lesions erythema or exudate. Lungs: Clear to auscultation bilaterally. Non-labored. Heart: Regular rate and rhythm, no murmurs rubs or gallops.  Abdomen: Bowel sounds are normal, nontender, nondistended, no hepatosplenomegaly or masses, no abdominal bruits or hernia , no rebound or guarding.   Extremities: No lower extremity edema. No clubbing or deformities. Neuro: Alert and oriented x 3.  Grossly intact. Skin: Warm and dry, no jaundice.   Psych: Alert and cooperative, normal mood and affect.   Imaging Studies: No results found.  Assessment and Plan:   Amanda Wang is a 70 y.o. y/o female here to follow up for iron deficiency anemia.  No overt blood loss.   Plan:  EGD in 1 year to resect any large hyperplastic polyps  Capsule study of the small bowel   Commence on iron tablets 325 mg twice daily  every other day  Dr Jonathon Bellows  MD,MRCP [facility]) Follow up in ***

## 2021-12-16 DIAGNOSIS — S83232A Complex tear of medial meniscus, current injury, left knee, initial encounter: Secondary | ICD-10-CM | POA: Diagnosis not present

## 2021-12-17 ENCOUNTER — Other Ambulatory Visit: Payer: Self-pay | Admitting: Internal Medicine

## 2021-12-17 DIAGNOSIS — I10 Essential (primary) hypertension: Secondary | ICD-10-CM

## 2021-12-17 NOTE — Telephone Encounter (Signed)
dc'd 10/06/21 "ineffective" by Dr Neomia Dear- pharmacy called spoke to [facility]. Med now taken off profile.  Requested Prescriptions  Refused Prescriptions Disp Refills   benazepril (LOTENSIN) 40 MG tablet [Pharmacy Med Name: BENAZEPRIL 40MG  TABLETS] 30 tablet 5    Sig: TAKE 1 TABLET(40 MG) BY MOUTH DAILY     Cardiovascular:  ACE Inhibitors Failed - 12/17/2021  8:09 AM      Failed - Cr in normal range and within 180 days    Creatinine  Date Value Ref Range Status  01/11/2021 54.2 20.0 - 300.0 mg/dL Final  07/24/2012 0.88 0.60 - 1.30 mg/dL Final   Creatinine, Ser  Date Value Ref Range Status  10/06/2021 1.13 (H) 0.57 - 1.00 mg/dL Final         Passed - K in normal range and within 180 days    Potassium  Date Value Ref Range Status  10/06/2021 3.8 3.5 - 5.2 mmol/L Final  07/24/2012 4.2 3.5 - 5.1 mmol/L Final         Passed - Patient is not pregnant      Passed - Last BP in normal range    BP Readings from Last 1 Encounters:  10/27/21 121/77         Passed - Valid encounter within last 6 months    Recent Outpatient Visits          1 month ago Cough in adult   [facility] Vigg, Avanti, MD   2 months ago Fatigue, unspecified type   [facility] Vigg, Avanti, MD   3 months ago Essential hypertension   [facility] Vigg, Avanti, MD   6 months ago Essential hypertension   [place] Vigg, Avanti, MD   11 months ago Depression, recurrent ([place])   Luckey, Barbaraann Faster, NP      Future Appointments            In 10 months [place], [facility]

## 2021-12-20 DIAGNOSIS — M25562 Pain in left knee: Secondary | ICD-10-CM | POA: Diagnosis not present

## 2021-12-20 DIAGNOSIS — S83242A Other tear of medial meniscus, current injury, left knee, initial encounter: Secondary | ICD-10-CM | POA: Diagnosis not present

## 2021-12-23 ENCOUNTER — Other Ambulatory Visit: Payer: Self-pay

## 2021-12-23 ENCOUNTER — Other Ambulatory Visit: Payer: Self-pay | Admitting: Internal Medicine

## 2021-12-23 MED ORDER — AMLODIPINE BESYLATE 10 MG PO TABS
10.0000 mg | ORAL_TABLET | Freq: Every day | ORAL | 6 refills | Status: DC
  Filled 2021-12-23: qty 30, 30d supply, fill #0

## 2021-12-26 DIAGNOSIS — M25562 Pain in left knee: Secondary | ICD-10-CM | POA: Diagnosis not present

## 2021-12-27 ENCOUNTER — Other Ambulatory Visit: Payer: Self-pay

## 2021-12-30 ENCOUNTER — Other Ambulatory Visit: Payer: Self-pay

## 2021-12-30 ENCOUNTER — Encounter: Payer: Self-pay | Admitting: Internal Medicine

## 2021-12-30 ENCOUNTER — Ambulatory Visit: Payer: Medicare PPO | Admitting: Internal Medicine

## 2021-12-30 VITALS — BP 110/72 | HR 101 | Temp 98.6°F | Ht 62.21 in | Wt 167.2 lb

## 2021-12-30 DIAGNOSIS — Z23 Encounter for immunization: Secondary | ICD-10-CM | POA: Diagnosis not present

## 2021-12-30 DIAGNOSIS — R7303 Prediabetes: Secondary | ICD-10-CM | POA: Diagnosis not present

## 2021-12-30 DIAGNOSIS — E785 Hyperlipidemia, unspecified: Secondary | ICD-10-CM | POA: Diagnosis not present

## 2021-12-30 DIAGNOSIS — G935 Compression of brain: Secondary | ICD-10-CM

## 2021-12-30 DIAGNOSIS — G2581 Restless legs syndrome: Secondary | ICD-10-CM | POA: Diagnosis not present

## 2021-12-30 DIAGNOSIS — I10 Essential (primary) hypertension: Secondary | ICD-10-CM | POA: Diagnosis not present

## 2021-12-30 DIAGNOSIS — D509 Iron deficiency anemia, unspecified: Secondary | ICD-10-CM | POA: Insufficient documentation

## 2021-12-30 DIAGNOSIS — M25562 Pain in left knee: Secondary | ICD-10-CM | POA: Diagnosis not present

## 2021-12-30 DIAGNOSIS — D649 Anemia, unspecified: Secondary | ICD-10-CM | POA: Diagnosis not present

## 2021-12-30 DIAGNOSIS — F339 Major depressive disorder, recurrent, unspecified: Secondary | ICD-10-CM | POA: Diagnosis not present

## 2021-12-30 MED ORDER — CYCLOBENZAPRINE HCL 10 MG PO TABS: ORAL_TABLET | ORAL | 1 refills | Status: DC

## 2021-12-30 MED ORDER — BENAZEPRIL HCL 40 MG PO TABS: 40.0000 mg | ORAL_TABLET | Freq: Every day | ORAL | 4 refills | Status: DC

## 2021-12-30 MED ORDER — LORAZEPAM 1 MG PO TABS: 0.5000 mg | ORAL_TABLET | Freq: Every day | ORAL | 2 refills | Status: DC | PRN

## 2021-12-30 MED ORDER — VILAZODONE HCL 40 MG PO TABS: 40.0000 mg | ORAL_TABLET | Freq: Every day | ORAL | 0 refills | Status: DC

## 2021-12-30 MED ORDER — ATORVASTATIN CALCIUM 40 MG PO TABS: 40.0000 mg | ORAL_TABLET | Freq: Every day | ORAL | 1 refills | Status: DC

## 2021-12-30 MED ORDER — LEVETIRACETAM 500 MG PO TABS: ORAL_TABLET | ORAL | 1 refills | Status: DC

## 2021-12-30 NOTE — Progress Notes (Signed)
BP 110/72    Pulse (!) 101    Temp 98.6 F (37 C) (Oral)    Ht 5' 2.21" (1.58 m)    Wt 167 lb 3.2 oz (75.8 kg)    SpO2 98%    BMI 30.38 kg/m    Subjective:    Patient ID: Amanda Wang, female    DOB: 1951/02/16, 71 y.o.   MRN: [id number]  Chief Complaint  Patient presents with   Medication Refill    Patient is here for medication refills today    Hypertension   Hyperlipidemia   Diabetes   Depression    HPI: Amanda Wang is a 71 y.o. female  TORN mENIScus on the left knee to have surg - jan 26th , off of work till then. Is a sub at school. Gets PT to get ready for surgery   Anemia ? Sec to polypectomy , Cscope wnl   Hypertension This is a chronic (is on benazapril 40 mg daily didnt take amlodipine x 2 weeks now ran out) problem. The current episode started more than 1 year ago. The problem has been rapidly improving since onset. Pertinent negatives include no shortness of breath.  Hyperlipidemia This is a chronic problem. The problem is controlled. Pertinent negatives include no shortness of breath.  Medication Refill Pertinent negatives include no congestion or coughing.  Depression        This is a chronic (is on vibryd and ativan stable) problem.  Chief Complaint  Patient presents with   Medication Refill    Patient is here for medication refills today    Hypertension   Hyperlipidemia   Diabetes   Depression    Relevant past medical, surgical, family and social history reviewed and updated as indicated. Interim medical history since our last visit reviewed. Allergies and medications reviewed and updated.  Review of Systems  HENT:  Negative for congestion, ear discharge, ear pain and facial swelling.   Respiratory:  Negative for cough, chest tightness, shortness of breath and wheezing.   Psychiatric/Behavioral:  Positive for depression.    Per HPI unless specifically indicated above     Objective:    BP 110/72    Pulse (!) 101    Temp 98.6 F  (37 C) (Oral)    Ht 5' 2.21" (1.58 m)    Wt 167 lb 3.2 oz (75.8 kg)    SpO2 98%    BMI 30.38 kg/m   Wt Readings from Last 3 Encounters:  12/30/21 167 lb 3.2 oz (75.8 kg)  10/27/21 170 lb (77.1 kg)  10/24/21 170 lb 3.2 oz (77.2 kg)    Physical Exam Vitals and nursing note reviewed.  Constitutional:      General: She is not in acute distress.    Appearance: Normal appearance. She is not ill-appearing or diaphoretic.  Eyes:     Conjunctiva/sclera: Conjunctivae normal.  Cardiovascular:     Heart sounds: No murmur heard.   No friction rub.  Pulmonary:     Effort: No respiratory distress.     Breath sounds: No wheezing or rhonchi.  Abdominal:     General: There is no distension.     Palpations: There is no mass.     Tenderness: There is no abdominal tenderness. There is no guarding.  Skin:    General: Skin is warm and dry.     Coloration: Skin is not jaundiced.     Findings: No erythema.  Neurological:     Mental Status: She  is alert.     Cranial Nerves: No cranial nerve deficit.  Psychiatric:        Mood and Affect: Mood normal.        Behavior: Behavior normal.        Thought Content: Thought content normal.   Results for orders placed or performed during the hospital encounter of 10/27/21  Glucose, capillary  Result Value Ref Range   Glucose-Capillary 109 (H) 70 - 99 mg/dL  Surgical pathology  Result Value Ref Range   SURGICAL PATHOLOGY      SURGICAL PATHOLOGY CASE: [id number] PATIENT: [name] Surgical Pathology Report     Specimen Submitted: A. Stomach polyp x2; hot snare  Clinical History: IDA D50.9. Stomach polyps.      DIAGNOSIS: A. STOMACH POLYP X2; HOT SNARE: - HYPERPLASTIC POLYP, TWO FRAGMENTS. - NEGATIVE FOR DYSPLASIA AND MALIGNANCY.  GROSS DESCRIPTION: A. Labeled: Hot snare polyps x2 stomach Received: Formalin Collection time: 9:05 AM on 10/27/2021 Placed into formalin time: 9:05 AM on 10/27/2021 Tissue fragment(s): 2 Size: Range  from 0.7-0.9 cm Description: Received are 2 fragments of tan-pink soft tissue.  The resection margins are differentially inked and both fragments are bisected. Entirely submitted in 1 cassette.  RB 10/27/2021  Final Diagnosis performed by Betsy Pries, MD.   Electronically signed 10/28/2021 10:47:05AM The electronic signature indicates that the named Attending Pathologist has evaluated the specimen Technical component performed at [facility], [address] [place], [place] [address] Lab: [id number] Dir: Rush Farmer, MD, MMM  Professional component performed at [facility], [facility], [place], [place], [place] [address] Lab: [phone] Dir: Kathi Simpers, MD         Current Outpatient Medications:    Accu-Chek Softclix Lancets lancets, TEST TWO TO THREE TIMES DAILY, Disp: 100 each, Rfl: 2   acyclovir (ZOVIRAX) 200 MG capsule, Take 200 mg by mouth 5 (five) times daily as needed (fever blisters)., Disp: , Rfl:    albuterol (VENTOLIN HFA) 108 (90 Base) MCG/ACT inhaler, INHALE 2 PUFFS INTO THE LUNGS EVERY 6 HOURS AS NEEDED FOR WHEEZING OR SHORTNESS OF BREATH, Disp: 6.7 g, Rfl: 0   amLODipine (NORVASC) 5 MG tablet, Take 5 mg by mouth daily., Disp: , Rfl:    Blood Glucose Monitoring Suppl (ONETOUCH VERIO) w/Device KIT, Use to check blood sugar 2-3 times daily and document -- bring to provider visits.  Goal is <130 fasting and <180 2 hours after eating., Disp: 1 kit, Rfl: 0   Carboxymethylcellul-Glycerin (LUBRICATING EYE DROPS OP), Place 1 drop into both eyes daily as needed (dry eyes)., Disp: , Rfl:    Charcoal Activated (ACTIVATED CHARCOAL PO), Take 2 capsules by mouth as needed (gas)., Disp: , Rfl:    Ferrous Sulfate (IRON) 325 (65 Fe) MG TABS, Take 1 tablet twice a day every other day., Disp: 15 tablet, Rfl: 1   gabapentin (NEURONTIN) 300 MG capsule, Take 1 capsule (300 mg total) by mouth 2 (two) times daily as needed (leg cramps). (Patient taking  differently: Take 600 mg by mouth at bedtime as needed (leg cramps).), Disp: 180 capsule, Rfl: 1   glucose blood (ONETOUCH VERIO) test strip, Use to check blood sugar 2-3 times daily and document -- bring to provider visits., Disp: 100 each, Rfl: 12   hydrochlorothiazide (HYDRODIURIL) 25 MG tablet, Take 1 tablet (25 mg total) by mouth daily., Disp: 90 tablet, Rfl: 4   ibuprofen (ADVIL) 200 MG tablet, Take 800 mg by mouth every 8 (eight) hours as needed  for moderate pain., Disp: , Rfl:    Lidocaine-Menthol (ICY HOT MAX LIDOCAINE EX), Apply 1 application topically daily as needed (pain)., Disp: , Rfl:    montelukast (SINGULAIR) 10 MG tablet, Take 1 tablet (10 mg total) by mouth at bedtime. (Patient taking differently: Take 10 mg by mouth daily as needed (allergies).), Disp: 90 tablet, Rfl: 1   omeprazole (PRILOSEC OTC) 20 MG tablet, Take 20 mg by mouth daily., Disp: , Rfl:    traMADol (ULTRAM) 50 MG tablet, Take 50 mg by mouth every 4 (four) hours as needed for pain., Disp: , Rfl:    Vilazodone HCl 20 MG TABS, Take 40 mg by mouth daily., Disp: , Rfl:    atorvastatin (LIPITOR) 40 MG tablet, Take 1 tablet (40 mg total) by mouth daily at 6 PM. Need appt with PCP prior to refill, Disp: 90 tablet, Rfl: 1   benazepril (LOTENSIN) 40 MG tablet, Take 1 tablet (40 mg total) by mouth daily., Disp: 30 tablet, Rfl: 4   cyclobenzaprine (FLEXERIL) 10 MG tablet, take 1 tablet by mouth three times a day if needed for muscle spasm, Disp: 90 tablet, Rfl: 1   levETIRAcetam (KEPPRA) 500 MG tablet, TAKE 1 TABLET(500 MG) BY MOUTH DAILY, Disp: 90 tablet, Rfl: 1   LORazepam (ATIVAN) 1 MG tablet, Take 0.5-1 tablets (0.5-1 mg total) by mouth daily as needed for anxiety., Disp: 30 tablet, Rfl: 2   Vilazodone HCl (VIIBRYD) 40 MG TABS, Take 1 tablet (40 mg total) by mouth daily., Disp: 90 tablet, Rfl: 0    Assessment & Plan:  Htn take only benazpril until restarts work if needed.  Continue current meds.  Medication compliance  emphasised. pt advised to keep Bp logs. Pt verbalised understanding of the same. Pt to have a low salt diet . Exercise to reach a goal of at least 150 mins a week.  lifestyle modifications explained and pt understands importance of the above. Under good control on current regimen. Continue current regimen. Continue to monitor. Call with any concerns. Refills given. Labs drawn today.  2. Left knee meniscal tear to be reapried soon fu with ortho for such @ [facility].  3. HLD is stable. recheck FLP, check LFT's work on diet, SE of meds explained to pt. low fat and high fiber diet explained to pt.   4. Anemia ? Etio check bw today , iron studies, b12 , folate l. pt deneis symptoms. adviced to contact./ call clinic if symptoms worsen.   5. Prediabetes checks it FSBs - 110 - 118 Lifestyle modifications advised to pt.  Portion control and avoiding high carb low fat diet advised.  Diet plan given to pt   exercise plan given and encouraged.  To increase exercise to 150 mins a week ie 21/2 hours a week. Pt verbalises understanding of the above.    6. Depression is on vibryd and ativan prn.  Stable, chroinc Problem List Items Addressed This Visit       Cardiovascular and Mediastinum   Primary hypertension   Relevant Medications   atorvastatin (LIPITOR) 40 MG tablet   benazepril (LOTENSIN) 40 MG tablet     Nervous and Auditory   Chiari malformation type I (HCC)   Relevant Medications   levETIRAcetam (KEPPRA) 500 MG tablet     Other   Depression, recurrent (HCC)   Relevant Medications   Vilazodone HCl (VIIBRYD) 40 MG TABS   LORazepam (ATIVAN) 1 MG tablet   Other Relevant Orders   Vitamin B12  Folate   CBC with Differential/Platelet   Ferritin   Iron and TIBC   TSH   Restless legs   Relevant Medications   cyclobenzaprine (FLEXERIL) 10 MG tablet   Other Relevant Orders   Vitamin B12   Folate   CBC with Differential/Platelet   Ferritin   Iron and TIBC   TSH   Need for  influenza vaccination - Primary   Relevant Orders   Flu Vaccine QUAD High Dose(Fluad) (Completed)   Need for pneumococcal vaccination   Relevant Orders   Pneumococcal conjugate vaccine 13-valent IM (Completed)   Vitamin B12   Folate   CBC with Differential/Platelet   Ferritin   Iron and TIBC   TSH   Hyperlipidemia   Relevant Medications   atorvastatin (LIPITOR) 40 MG tablet   benazepril (LOTENSIN) 40 MG tablet   Other Relevant Orders   Vitamin B12   Folate   CBC with Differential/Platelet   Ferritin   Iron and TIBC   TSH   Anemia   Other Visit Diagnoses     Prediabetes       Relevant Orders   Bayer DCA Hb A1c Waived (STAT)        Orders Placed This Encounter  Procedures   Pneumococcal conjugate vaccine 13-valent IM   Flu Vaccine QUAD High Dose(Fluad)   Vitamin B12   Folate   CBC with Differential/Platelet   Ferritin   Iron and TIBC   TSH   Bayer DCA Hb A1c Waived (STAT)     Meds ordered this encounter  Medications   Vilazodone HCl (VIIBRYD) 40 MG TABS    Sig: Take 1 tablet (40 mg total) by mouth daily.    Dispense:  90 tablet    Refill:  0   atorvastatin (LIPITOR) 40 MG tablet    Sig: Take 1 tablet (40 mg total) by mouth daily at 6 PM. Need appt with PCP prior to refill    Dispense:  90 tablet    Refill:  1   benazepril (LOTENSIN) 40 MG tablet    Sig: Take 1 tablet (40 mg total) by mouth daily.    Dispense:  30 tablet    Refill:  4   cyclobenzaprine (FLEXERIL) 10 MG tablet    Sig: take 1 tablet by mouth three times a day if needed for muscle spasm    Dispense:  90 tablet    Refill:  1   levETIRAcetam (KEPPRA) 500 MG tablet    Sig: TAKE 1 TABLET(500 MG) BY MOUTH DAILY    Dispense:  90 tablet    Refill:  1   LORazepam (ATIVAN) 1 MG tablet    Sig: Take 0.5-1 tablets (0.5-1 mg total) by mouth daily as needed for anxiety.    Dispense:  30 tablet    Refill:  2     Follow up plan: No follow-ups on file.

## 2021-12-31 LAB — CBC WITH DIFFERENTIAL/PLATELET
Basophils Absolute: 0 10*3/uL (ref 0.0–0.2)
Basos: 0 %
EOS (ABSOLUTE): 0.1 10*3/uL (ref 0.0–0.4)
Eos: 1 %
Hematocrit: 38 % (ref 34.0–46.6)
Hemoglobin: 12.2 g/dL (ref 11.1–15.9)
Immature Grans (Abs): 0 10*3/uL (ref 0.0–0.1)
Immature Granulocytes: 0 %
Lymphocytes Absolute: 1.3 10*3/uL (ref 0.7–3.1)
Lymphs: 13 %
MCH: 25.2 pg — ABNORMAL LOW (ref 26.6–33.0)
MCHC: 32.1 g/dL (ref 31.5–35.7)
MCV: 79 fL (ref 79–97)
Monocytes Absolute: 0.7 10*3/uL (ref 0.1–0.9)
Monocytes: 7 %
Neutrophils Absolute: 7.8 10*3/uL — ABNORMAL HIGH (ref 1.4–7.0)
Neutrophils: 79 %
Platelets: 431 10*3/uL (ref 150–450)
RBC: 4.84 x10E6/uL (ref 3.77–5.28)
RDW: 16.8 % — ABNORMAL HIGH (ref 11.7–15.4)
WBC: 10 10*3/uL (ref 3.4–10.8)

## 2021-12-31 LAB — IRON AND TIBC
Iron Saturation: 14 % — ABNORMAL LOW (ref 15–55)
Iron: 41 ug/dL (ref 27–139)
Total Iron Binding Capacity: 298 ug/dL (ref 250–450)
UIBC: 257 ug/dL (ref 118–369)

## 2021-12-31 LAB — FERRITIN: Ferritin: 27 ng/mL (ref 15–150)

## 2021-12-31 LAB — VITAMIN B12: Vitamin B-12: 686 pg/mL (ref 232–1245)

## 2021-12-31 LAB — FOLATE: Folate: 15.4 ng/mL (ref >3.0)

## 2021-12-31 LAB — TSH: TSH: 1.24 u[IU]/mL (ref 0.450–4.500)

## 2022-01-03 ENCOUNTER — Encounter
Admission: RE | Admit: 2022-01-03 | Discharge: 2022-01-03 | Disposition: A | Discharge status: Home / Self Care | Payer: Medicare PPO | Source: Ambulatory Visit | Attending: Orthopedic Surgery | Admitting: Orthopedic Surgery

## 2022-01-03 ENCOUNTER — Other Ambulatory Visit: Payer: Self-pay | Admitting: Orthopedic Surgery

## 2022-01-03 ENCOUNTER — Other Ambulatory Visit: Payer: Self-pay

## 2022-01-03 DIAGNOSIS — I10 Essential (primary) hypertension: Secondary | ICD-10-CM

## 2022-01-03 NOTE — Patient Instructions (Addendum)
Your procedure is scheduled on: 01/12/2022  Report to the Registration Desk on the 1st floor of the [facility]. To find out your arrival time, please call [phone] between 1PM - 3PM on:01/11/2022    REMEMBER: Instructions that are not followed completely may result in serious medical risk, up to and including death; or upon the discretion of your surgeon and anesthesiologist your surgery may need to be rescheduled.  Do not eat food after midnight the night before surgery.  No gum chewing, lozengers or hard candies.  You may however, drink CLEAR liquids up to 2 hours before you are scheduled to arrive for your surgery. Do not drink anything within 2 hours of your scheduled arrival time.  Clear liquids include: - water  - apple juice without pulp - gatorade (not RED, PURPLE, OR BLUE) - black coffee or tea (Do NOT add milk or creamers to the coffee or tea) Do NOT drink anything that is not on this list.   TAKE THESE MEDICATIONS THE MORNING OF SURGERY WITH A SIP OF WATER: Flexeril as needed Gabapentin as needed Tramadol as needed Vilazodone omeprazole   Use inhalers on the day of surgery and bring to the hospital.  One week prior to surgery: Stop Anti-inflammatories (NSAIDS) such as Advil, Aleve, Ibuprofen, Motrin, Naproxen, Naprosyn and Aspirin based products such as Excedrin, Goodys Powder, BC Powder.  Stop ANY OVER THE COUNTER supplements until after surgery like activated charcoal You may however, continue to take Tylenol if needed for pain up until the day of surgery.  No Alcohol for 24 hours before or after surgery.  No Smoking including e-cigarettes for 24 hours prior to surgery.  No chewable tobacco products for at least 6 hours prior to surgery.  No nicotine patches on the day of surgery.  Do not use any "recreational" drugs for at least a week prior to your surgery.  Please be advised that the combination of cocaine and anesthesia may have negative outcomes, up  to and including death. If you test positive for cocaine, your surgery will be cancelled.  On the morning of surgery brush your teeth with toothpaste and water, you may rinse your mouth with mouthwash if you wish. Do not swallow any toothpaste or mouthwash.  Use CHG Soap or wipes as directed on instruction sheet.-provided for you. If you have allergic reaction on the first use, do not use it again that morning. Tell you Pre-op RN about it on the day of surgery.   Do not wear jewelry, make-up, hairpins, clips or nail polish.  Do not wear lotions, powders, or perfumes.   Do not shave body from the neck down 48 hours prior to surgery just in case you cut yourself which could leave a site for infection.  Also, freshly shaved skin may become irritated if using the CHG soap.  Contact lenses, hearing aids and dentures may not be worn into surgery.  Do not bring valuables to the hospital. [facility] is not responsible for any missing/lost belongings or valuables.   Notify your doctor if there is any change in your medical condition (cold, fever, infection).  Wear comfortable clothing (specific to your surgery type) to the hospital.  After surgery, you can help prevent lung complications by doing breathing exercises.  Take deep breaths and cough every 1-2 hours. Your doctor may order a device called an Incentive Spirometer to help you take deep breaths.  If you are being admitted to the hospital overnight, leave your suitcase in  the car. After surgery it may be brought to your room.  If you are being discharged the day of surgery, you will not be allowed to drive home. You will need a responsible adult (18 years or older) to drive you home and stay with you that night.   If you are taking public transportation, you will need to have a responsible adult (18 years or older) with you. Please confirm with your physician that it is acceptable to use public transportation.   Please call the  [facility] Dept. at [phone] if you have any questions about these instructions.  Surgery Visitation Policy:  Patients undergoing a surgery or procedure may have one family member or support person with them as long as that person is not COVID-19 positive or experiencing its symptoms.  That person may remain in the waiting area during the procedure and may rotate out with other people.  Inpatient Visitation:    Visiting hours are 7 a.m. to 8 p.m. Up to two visitors ages 16+ are allowed at one time in a patient room. The visitors may rotate out with other people during the day. Visitors must check out when they leave, or other visitors will not be allowed. One designated support person may remain overnight. The visitor must pass COVID-19 screenings, use hand sanitizer when entering and exiting the patients room and wear a mask at all times, including in the patients room. Patients must also wear a mask when staff or their visitor are in the room. Masking is required regardless of vaccination status.

## 2022-01-04 ENCOUNTER — Encounter
Admission: RE | Admit: 2022-01-04 | Discharge: 2022-01-04 | Disposition: A | Discharge status: Home / Self Care | Payer: Medicare PPO | Source: Ambulatory Visit | Attending: Orthopedic Surgery | Admitting: Orthopedic Surgery

## 2022-01-04 DIAGNOSIS — I10 Essential (primary) hypertension: Secondary | ICD-10-CM | POA: Insufficient documentation

## 2022-01-04 DIAGNOSIS — Z01810 Encounter for preprocedural cardiovascular examination: Secondary | ICD-10-CM | POA: Diagnosis not present

## 2022-01-04 DIAGNOSIS — Z01818 Encounter for other preprocedural examination: Secondary | ICD-10-CM | POA: Insufficient documentation

## 2022-01-04 DIAGNOSIS — M25562 Pain in left knee: Secondary | ICD-10-CM | POA: Diagnosis not present

## 2022-01-04 LAB — CBC
HCT: 35.1 % — ABNORMAL LOW (ref 36.0–46.0)
Hemoglobin: 11 g/dL — ABNORMAL LOW (ref 12.0–15.0)
MCH: 25.3 pg — ABNORMAL LOW (ref 26.0–34.0)
MCHC: 31.3 g/dL (ref 30.0–36.0)
MCV: 80.7 fL (ref 80.0–100.0)
Platelets: 387 10*3/uL (ref 150–400)
RBC: 4.35 MIL/uL (ref 3.87–5.11)
RDW: 17.5 % — ABNORMAL HIGH (ref 11.5–15.5)
WBC: 7.6 10*3/uL (ref 4.0–10.5)
nRBC: 0 % (ref 0.0–0.2)

## 2022-01-04 LAB — BASIC METABOLIC PANEL
Anion gap: 5 (ref 5–15)
BUN: 10 mg/dL (ref 8–23)
CO2: 27 mmol/L (ref 22–32)
Calcium: 8.6 mg/dL — ABNORMAL LOW (ref 8.9–10.3)
Chloride: 107 mmol/L (ref 98–111)
Creatinine, Ser: 0.76 mg/dL (ref 0.44–1.00)
GFR, Estimated: >60 mL/min (ref >60)
Glucose, Bld: 157 mg/dL — ABNORMAL HIGH (ref 70–99)
Potassium: 3.3 mmol/L — ABNORMAL LOW (ref 3.5–5.1)
Sodium: 139 mmol/L (ref 135–145)

## 2022-01-12 ENCOUNTER — Ambulatory Visit
Admission: RE | Admit: 2022-01-12 | Discharge: 2022-01-12 | Disposition: A | Discharge status: Home / Self Care | Payer: Medicare PPO | Attending: Orthopedic Surgery | Admitting: Orthopedic Surgery

## 2022-01-12 ENCOUNTER — Other Ambulatory Visit: Payer: Self-pay

## 2022-01-12 ENCOUNTER — Encounter: Payer: Self-pay | Admitting: Orthopedic Surgery

## 2022-01-12 ENCOUNTER — Encounter
Admission: RE | Disposition: A | Discharge status: Home / Self Care | Payer: Self-pay | Source: Home / Self Care | Attending: Orthopedic Surgery

## 2022-01-12 ENCOUNTER — Ambulatory Visit: Payer: Medicare PPO | Admitting: Certified Registered Nurse Anesthetist

## 2022-01-12 DIAGNOSIS — S83242A Other tear of medial meniscus, current injury, left knee, initial encounter: Secondary | ICD-10-CM | POA: Insufficient documentation

## 2022-01-12 DIAGNOSIS — Z79899 Other long term (current) drug therapy: Secondary | ICD-10-CM | POA: Diagnosis not present

## 2022-01-12 DIAGNOSIS — E785 Hyperlipidemia, unspecified: Secondary | ICD-10-CM | POA: Diagnosis not present

## 2022-01-12 DIAGNOSIS — X58XXXA Exposure to other specified factors, initial encounter: Secondary | ICD-10-CM | POA: Insufficient documentation

## 2022-01-12 DIAGNOSIS — F32A Depression, unspecified: Secondary | ICD-10-CM | POA: Diagnosis not present

## 2022-01-12 DIAGNOSIS — S83232A Complex tear of medial meniscus, current injury, left knee, initial encounter: Secondary | ICD-10-CM | POA: Diagnosis not present

## 2022-01-12 DIAGNOSIS — Z87891 Personal history of nicotine dependence: Secondary | ICD-10-CM | POA: Diagnosis not present

## 2022-01-12 DIAGNOSIS — G4730 Sleep apnea, unspecified: Secondary | ICD-10-CM | POA: Insufficient documentation

## 2022-01-12 DIAGNOSIS — E119 Type 2 diabetes mellitus without complications: Secondary | ICD-10-CM | POA: Insufficient documentation

## 2022-01-12 DIAGNOSIS — M65812 Other synovitis and tenosynovitis, left shoulder: Secondary | ICD-10-CM | POA: Diagnosis not present

## 2022-01-12 DIAGNOSIS — K219 Gastro-esophageal reflux disease without esophagitis: Secondary | ICD-10-CM | POA: Diagnosis not present

## 2022-01-12 DIAGNOSIS — M1712 Unilateral primary osteoarthritis, left knee: Secondary | ICD-10-CM | POA: Diagnosis not present

## 2022-01-12 DIAGNOSIS — I10 Essential (primary) hypertension: Secondary | ICD-10-CM | POA: Insufficient documentation

## 2022-01-12 DIAGNOSIS — S83207A Unspecified tear of unspecified meniscus, current injury, left knee, initial encounter: Secondary | ICD-10-CM | POA: Diagnosis not present

## 2022-01-12 HISTORY — PX: KNEE ARTHROSCOPY WITH MEDIAL MENISECTOMY: SHX5651

## 2022-01-12 SURGERY — ARTHROSCOPY, KNEE, WITH MEDIAL MENISCECTOMY
Anesthesia: General | Site: Knee | Laterality: Left

## 2022-01-12 MED ORDER — DEXAMETHASONE SODIUM PHOSPHATE 10 MG/ML IJ SOLN
INTRAMUSCULAR | Status: DC | PRN
  Administered 2022-01-12: 10 mg via INTRAVENOUS

## 2022-01-12 MED ORDER — BUPIVACAINE-EPINEPHRINE (PF) 0.25% -1:200000 IJ SOLN
INTRAMUSCULAR | Status: AC
  Filled 2022-01-12: qty 30

## 2022-01-12 MED ORDER — FENTANYL CITRATE (PF) 100 MCG/2ML IJ SOLN
INTRAMUSCULAR | Status: AC
  Filled 2022-01-12: qty 2

## 2022-01-12 MED ORDER — PROPOFOL 10 MG/ML IV BOLUS
INTRAVENOUS | Status: DC | PRN
  Administered 2022-01-12: 150 mg via INTRAVENOUS

## 2022-01-12 MED ORDER — APREPITANT 40 MG PO CAPS
ORAL_CAPSULE | ORAL | Status: AC
  Filled 2022-01-12: qty 1

## 2022-01-12 MED ORDER — FENTANYL CITRATE (PF) 100 MCG/2ML IJ SOLN
INTRAMUSCULAR | Status: DC | PRN
  Administered 2022-01-12 (×2): 25 ug via INTRAVENOUS
  Administered 2022-01-12 (×2): 50 ug via INTRAVENOUS

## 2022-01-12 MED ORDER — BUPIVACAINE-EPINEPHRINE (PF) 0.25% -1:200000 IJ SOLN
INTRAMUSCULAR | Status: DC | PRN
  Administered 2022-01-12: 25 mL

## 2022-01-12 MED ORDER — ORAL CARE MOUTH RINSE: 15.0000 mL | Freq: Once | OROMUCOSAL | Status: DC

## 2022-01-12 MED ORDER — LACTATED RINGERS IV SOLN: INTRAVENOUS | Status: DC

## 2022-01-12 MED ORDER — CHLORHEXIDINE GLUCONATE 0.12 % MT SOLN
OROMUCOSAL | Status: AC
  Filled 2022-01-12: qty 15

## 2022-01-12 MED ORDER — ONDANSETRON HCL 4 MG/2ML IJ SOLN: 4.0000 mg | Freq: Once | INTRAMUSCULAR | Status: DC | PRN

## 2022-01-12 MED ORDER — HYDROCODONE-ACETAMINOPHEN 5-325 MG PO TABS: 1.0000 | ORAL_TABLET | Freq: Once | ORAL | Status: AC

## 2022-01-12 MED ORDER — CEFAZOLIN SODIUM-DEXTROSE 2-4 GM/100ML-% IV SOLN
INTRAVENOUS | Status: AC
  Filled 2022-01-12: qty 100

## 2022-01-12 MED ORDER — CHLORHEXIDINE GLUCONATE CLOTH 2 % EX PADS: 6.0000 | MEDICATED_PAD | Freq: Once | CUTANEOUS | Status: DC

## 2022-01-12 MED ORDER — ACETAMINOPHEN 500 MG PO TABS: 1000.0000 mg | ORAL_TABLET | ORAL | Status: AC

## 2022-01-12 MED ORDER — DOCUSATE SODIUM 100 MG PO CAPS: 100.0000 mg | ORAL_CAPSULE | Freq: Two times a day (BID) | ORAL | 2 refills | Status: DC

## 2022-01-12 MED ORDER — FENTANYL CITRATE (PF) 100 MCG/2ML IJ SOLN
25.0000 ug | INTRAMUSCULAR | Status: DC | PRN
  Administered 2022-01-12 (×2): 25 ug via INTRAVENOUS

## 2022-01-12 MED ORDER — APREPITANT 40 MG PO CAPS
40.0000 mg | ORAL_CAPSULE | Freq: Once | ORAL | Status: AC
  Administered 2022-01-12: 40 mg via ORAL

## 2022-01-12 MED ORDER — ASPIRIN EC 325 MG PO TBEC: 325.0000 mg | DELAYED_RELEASE_TABLET | Freq: Every day | ORAL | 0 refills | Status: DC

## 2022-01-12 MED ORDER — LIDOCAINE HCL (PF) 1 % IJ SOLN
INTRAMUSCULAR | Status: AC
  Filled 2022-01-12: qty 30

## 2022-01-12 MED ORDER — CHLORHEXIDINE GLUCONATE CLOTH 2 % EX PADS
6.0000 | MEDICATED_PAD | Freq: Once | CUTANEOUS | Status: AC
  Administered 2022-01-12: 6 via TOPICAL

## 2022-01-12 MED ORDER — MIDAZOLAM HCL 2 MG/2ML IJ SOLN
INTRAMUSCULAR | Status: AC
  Filled 2022-01-12: qty 2

## 2022-01-12 MED ORDER — FENTANYL CITRATE (PF) 100 MCG/2ML IJ SOLN
INTRAMUSCULAR | Status: AC
  Administered 2022-01-12: 50 ug via INTRAVENOUS
  Filled 2022-01-12: qty 2

## 2022-01-12 MED ORDER — RINGERS IRRIGATION IR SOLN
Status: DC | PRN
  Administered 2022-01-12: 12000 mL

## 2022-01-12 MED ORDER — MIDAZOLAM HCL 2 MG/2ML IJ SOLN
INTRAMUSCULAR | Status: DC | PRN
  Administered 2022-01-12: 2 mg via INTRAVENOUS

## 2022-01-12 MED ORDER — HYDROCODONE-ACETAMINOPHEN 5-325 MG PO TABS: 1.0000 | ORAL_TABLET | ORAL | 0 refills | Status: DC | PRN

## 2022-01-12 MED ORDER — ONDANSETRON HCL 4 MG PO TABS: 4.0000 mg | ORAL_TABLET | Freq: Three times a day (TID) | ORAL | 0 refills | Status: DC | PRN

## 2022-01-12 MED ORDER — ACETAMINOPHEN 500 MG PO TABS
ORAL_TABLET | ORAL | Status: AC
  Administered 2022-01-12: 1000 mg via ORAL
  Filled 2022-01-12: qty 2

## 2022-01-12 MED ORDER — CEFAZOLIN SODIUM-DEXTROSE 2-4 GM/100ML-% IV SOLN
2.0000 g | INTRAVENOUS | Status: AC
  Administered 2022-01-12: 2 g via INTRAVENOUS

## 2022-01-12 MED ORDER — ONDANSETRON HCL 4 MG/2ML IJ SOLN
INTRAMUSCULAR | Status: DC | PRN
  Administered 2022-01-12: 4 mg via INTRAVENOUS

## 2022-01-12 MED ORDER — DEXMEDETOMIDINE (PRECEDEX) IN NS 20 MCG/5ML (4 MCG/ML) IV SYRINGE
PREFILLED_SYRINGE | INTRAVENOUS | Status: DC | PRN
  Administered 2022-01-12 (×5): 4 ug via INTRAVENOUS

## 2022-01-12 MED ORDER — LIDOCAINE HCL (CARDIAC) PF 100 MG/5ML IV SOSY
PREFILLED_SYRINGE | INTRAVENOUS | Status: DC | PRN
  Administered 2022-01-12: 80 mg via INTRAVENOUS

## 2022-01-12 MED ORDER — BUPIVACAINE LIPOSOME 1.3 % IJ SUSP
INTRAMUSCULAR | Status: AC
  Filled 2022-01-12: qty 20

## 2022-01-12 MED ORDER — LIDOCAINE HCL (PF) 1 % IJ SOLN
INTRAMUSCULAR | Status: DC | PRN
  Administered 2022-01-12: 8 mL

## 2022-01-12 MED ORDER — HYDROCODONE-ACETAMINOPHEN 5-325 MG PO TABS
ORAL_TABLET | ORAL | Status: AC
  Administered 2022-01-12: 1 via ORAL
  Filled 2022-01-12: qty 1

## 2022-01-12 SURGICAL SUPPLY — 36 items
ADAPTER IRRIG TUBE 2 SPIKE SOL (ADAPTER) ×2 IMPLANT
ADPR TBG 2 SPK PMP STRL ASCP (ADAPTER)
BLADE FULL RADIUS 3.5 (BLADE) ×1 IMPLANT
BLADE SHAVER 4.5X7 STR FR (MISCELLANEOUS) ×2 IMPLANT
CANNULA PART THRD DISP 5.75X7 (CANNULA) ×1 IMPLANT
COOLER POLAR GLACIER W/PUMP (MISCELLANEOUS) ×1 IMPLANT
DRAPE ARTHRO LIMB 89X125 STRL (DRAPES) ×2 IMPLANT
DRAPE IMP U-DRAPE 54X76 (DRAPES) ×2 IMPLANT
DURAPREP 26ML APPLICATOR (WOUND CARE) ×5 IMPLANT
GAUZE SPONGE 4X4 12PLY STRL (GAUZE/BANDAGES/DRESSINGS) ×2 IMPLANT
GAUZE XEROFORM 1X8 LF (GAUZE/BANDAGES/DRESSINGS) ×2 IMPLANT
GLOVE SURG ORTHO LTX SZ9 (GLOVE) ×4 IMPLANT
GLOVE SURG UNDER POLY LF SZ9 (GLOVE) ×2 IMPLANT
GOWN STRL REUS W/ TWL LRG LVL3 (GOWN DISPOSABLE) ×1 IMPLANT
GOWN STRL REUS W/TWL 2XL LVL3 (GOWN DISPOSABLE) ×2 IMPLANT
GOWN STRL REUS W/TWL LRG LVL3 (GOWN DISPOSABLE) ×2
IV LACTATED RINGER IRRG 3000ML (IV SOLUTION) ×8
IV LR IRRIG 3000ML ARTHROMATIC (IV SOLUTION) ×6 IMPLANT
KIT TURNOVER KIT A (KITS) ×2 IMPLANT
MANIFOLD NEPTUNE II (INSTRUMENTS) ×3 IMPLANT
MAT ABSORB  FLUID 56X50 GRAY (MISCELLANEOUS) ×1
MAT ABSORB FLUID 56X50 GRAY (MISCELLANEOUS) ×1 IMPLANT
NEEDLE HYPO 22GX1.5 SAFETY (NEEDLE) ×2 IMPLANT
PACK ARTHROSCOPY KNEE (MISCELLANEOUS) ×2 IMPLANT
PAD ABD DERMACEA PRESS 5X9 (GAUZE/BANDAGES/DRESSINGS) ×4 IMPLANT
PAD WRAPON POLAR KNEE (MISCELLANEOUS) IMPLANT
SHAVER BLADE TAPERED BLUNT 4 (BLADE) ×1 IMPLANT
SPONGE T-LAP 18X18 ~~LOC~~+RFID (SPONGE) ×2 IMPLANT
STRIP CLOSURE SKIN 1/2X4 (GAUZE/BANDAGES/DRESSINGS) ×2 IMPLANT
SUT ETHILON 4-0 (SUTURE) ×2
SUT ETHILON 4-0 FS2 18XMFL BLK (SUTURE) ×1
SUTURE ETHLN 4-0 FS2 18XMF BLK (SUTURE) ×1 IMPLANT
TUBING INFLOW SET DBFLO PUMP (TUBING) ×2 IMPLANT
TUBING OUTFLOW SET DBLFO PUMP (TUBING) ×2 IMPLANT
WAND WEREWOLF FLOW 90D (MISCELLANEOUS) ×2 IMPLANT
WRAPON POLAR PAD KNEE (MISCELLANEOUS) ×2

## 2022-01-12 NOTE — Anesthesia Procedure Notes (Signed)
Procedure Name: LMA Insertion Date/Time: 01/12/2022 11:26 AM Performed by: Lily Peer, Autry Prust, CRNA Pre-anesthesia Checklist: Patient identified, Emergency Drugs available, Suction available and Patient being monitored Patient Re-evaluated:Patient Re-evaluated prior to induction Oxygen Delivery Method: Circle system utilized Preoxygenation: Pre-oxygenation with 100% oxygen Induction Type: IV induction Ventilation: Mask ventilation without difficulty LMA: LMA inserted LMA Size: 3.5 Number of attempts: 1 Placement Confirmation: positive ETCO2 and breath sounds checked- equal and bilateral Tube secured with: Tape Dental Injury: Teeth and Oropharynx as per pre-operative assessment

## 2022-01-12 NOTE — Anesthesia Preprocedure Evaluation (Signed)
Anesthesia Evaluation  Patient identified by MRN, date of birth, ID band Patient awake    Reviewed: Allergy & Precautions, NPO status , Patient's Chart, lab work & pertinent test results  History of Anesthesia Complications (+) PONV and history of anesthetic complications  Airway Mallampati: II  TM Distance: >3 FB Neck ROM: Full    Dental  (+) Chipped, Dental Advidsory Given   Pulmonary neg shortness of breath, neg sleep apnea, neg COPD, neg recent URI, former smoker,    Pulmonary exam normal        Cardiovascular Exercise Tolerance: Good hypertension, Pt. on medications (-) angina(-) CAD, (-) Past MI, (-) Cardiac Stents and (-) CABG Normal cardiovascular exam(-) dysrhythmias  - Systolic murmurs    Neuro/Psych neg Seizures PSYCHIATRIC DISORDERS Anxiety Depression negative neurological ROS     GI/Hepatic Neg liver ROS, hiatal hernia, GERD  ,  Endo/Other  diabetes  Renal/GU negative Renal ROS     Musculoskeletal  (+) Arthritis ,   Abdominal (+) - obese,   Peds  Hematology negative hematology ROS (+)   Anesthesia Other Findings Past Medical History: No date: Arthritis No date: Chiari malformation type I (HCC) No date: Depression No date: GERD (gastroesophageal reflux disease) No date: History of hiatal hernia No date: Hyperlipidemia No date: Hypertension No date: Sleep apnea No date: Spinal stenosis   Reproductive/Obstetrics                             Anesthesia Physical  Anesthesia Plan  ASA: 3  Anesthesia Plan: General   Post-op Pain Management:    Induction: Intravenous  PONV Risk Score and Plan: 2 and Ondansetron, Dexamethasone, Aprepitant, Treatment may vary due to age or medical condition and Midazolam  Airway Management Planned: LMA  Additional Equipment:   Intra-op Plan:   Post-operative Plan: Extubation in OR  Informed Consent: I have reviewed the patients  History and Physical, chart, labs and discussed the procedure including the risks, benefits and alternatives for the proposed anesthesia with the patient or authorized representative who has indicated his/her understanding and acceptance.     Dental advisory given  Plan Discussed with: CRNA and Anesthesiologist  Anesthesia Plan Comments: (Patient consented for risks of anesthesia including but not limited to:  - adverse reactions to medications - risk of airway placement if required - damage to eyes, teeth, lips or other oral mucosa - nerve damage due to positioning  - sore throat or hoarseness - Damage to heart, brain, nerves, lungs, other parts of body or loss of life  Patient voiced understanding.)        Anesthesia Quick Evaluation

## 2022-01-12 NOTE — H&P (Signed)
PREOPERATIVE H&P  Chief Complaint: left medial meniscus tear  HPI: Amanda Wang is a 71 y.o. female who presents for preoperative history and physical with a diagnosis of left medial meniscus tear and osteoarthritis confirmed by MRI. Symptoms of pain and mechanical symptoms are significantly impairing activities of daily living.  Patient has failed nonoperative management wished proceed with an arthroscopic partial medial meniscectomy left knee.   Past Medical History:  Diagnosis Date   Advanced care planning/counseling discussion 09/16/2018   Arthritis    Chiari malformation type I ([place])    Depression    GERD (gastroesophageal reflux disease)    History of hiatal hernia    Hyperlipidemia    Hypertension    PONV (postoperative nausea and vomiting)    Sleep apnea    Small intestinal bacterial overgrowth    Spinal stenosis    Vaccine reaction, initial encounter 03/11/2020   Past Surgical History:  Procedure Laterality Date   BREAST BIOPSY Right    neg   chiari malformation I decompression surgery  11-06   COLONOSCOPY WITH PROPOFOL N/A 11/28/2018   Procedure: COLONOSCOPY WITH PROPOFOL;  Surgeon: Jonathon Bellows, MD;  Location: [facility] ENDOSCOPY;  Service: Gastroenterology;  Laterality: N/A;   COLONOSCOPY WITH PROPOFOL N/A 10/27/2021   Procedure: COLONOSCOPY WITH PROPOFOL;  Surgeon: Jonathon Bellows, MD;  Location: [facility] ENDOSCOPY;  Service: Gastroenterology;  Laterality: N/A;   deviated septum repair     ESOPHAGOGASTRODUODENOSCOPY N/A 10/27/2021   Procedure: ESOPHAGOGASTRODUODENOSCOPY (EGD);  Surgeon: Jonathon Bellows, MD;  Location: [facility] ENDOSCOPY;  Service: Gastroenterology;  Laterality: N/A;   HERNIA REPAIR     Social History   Socioeconomic History   Marital status: Divorced    Spouse name: Not on file   Number of children: Not on file   Years of education: Not on file   Highest education level: Bachelor's degree (e.g., BA, AB, BS)  Occupational History   Occupation: part time    Tobacco Use   Smoking status: Former    Packs/day: 0.50    Years: 1.00    Pack years: 0.50    Types: Cigarettes    Quit date: 07/26/1977    Years since quitting: 44.4   Smokeless tobacco: Never   Tobacco comments:    barley smoked when smoked   Vaping Use   Vaping Use: Never used  Substance and Sexual Activity   Alcohol use: Yes    Alcohol/week: 0.0 standard drinks    Comment: on occasion 1-2 x month   Drug use: No   Sexual activity: Not on file  Other Topics Concern   Not on file  Social History Narrative   Lives alone.   Social Determinants of Health   Financial Resource Strain: Low Risk    Difficulty of Paying Living Expenses: Not hard at all  Food Insecurity: No Food Insecurity   Worried About Charity fundraiser in the Last Year: Never true   [place] in the Last Year: Never true  Transportation Needs: No Transportation Needs   Lack of Transportation (Medical): No   Lack of Transportation (Non-Medical): No  Physical Activity: Inactive   Days of Exercise per Week: 0 days   Minutes of Exercise per Session: 0 min  Stress: Stress Concern Present   Feeling of Stress : To some extent  Social Connections: Not on file   Family History  Problem Relation Age of Onset   Hypertension Mother    Cancer Mother    Stroke Mother  Heart disease Father    Breast cancer Sister 80   Breast cancer Paternal Aunt    Diabetes Paternal Grandmother    Allergies  Allergen Reactions   Fexofenadine Anxiety    Makes her have a panic attack   Codeine Nausea Only    Dizziness   Methylphenidate Hcl     "Caused a seizure"   Zoloft [Sertraline] Other (See Comments)    Sleepiness   Magnevist [Gadopentetate] Hives and Itching    IV Contrast   Sulfa Antibiotics Rash   Prior to Admission medications   Medication Sig Start Date End Date Taking? Authorizing Provider  acyclovir (ZOVIRAX) 200 MG capsule Take 200 mg by mouth 5 (five) times daily as needed (fever blisters).  12/05/21  Yes [provider]  albuterol (VENTOLIN HFA) 108 (90 Base) MCG/ACT inhaler INHALE 2 PUFFS INTO THE LUNGS EVERY 6 HOURS AS NEEDED FOR WHEEZING OR SHORTNESS OF BREATH 11/22/21  Yes Vigg, Avanti, MD  amLODipine (NORVASC) 5 MG tablet Take 5 mg by mouth daily. Patient not taking: Reported on 01/03/2022   Yes [provider]  atorvastatin (LIPITOR) 40 MG tablet Take 1 tablet (40 mg total) by mouth daily at 6 PM. Need appt with PCP prior to refill 12/30/21  Yes Vigg, Avanti, MD  benazepril (LOTENSIN) 40 MG tablet Take 1 tablet (40 mg total) by mouth daily. 12/30/21  Yes Vigg, Avanti, MD  Carboxymethylcellul-Glycerin (LUBRICATING EYE DROPS OP) Place 1 drop into both eyes daily as needed (dry eyes).   Yes [provider]  Charcoal Activated (ACTIVATED CHARCOAL PO) Take 2 capsules by mouth as needed (gas).   Yes [provider]  cyclobenzaprine (FLEXERIL) 10 MG tablet take 1 tablet by mouth three times a day if needed for muscle spasm 12/30/21  Yes Vigg, Avanti, MD  gabapentin (NEURONTIN) 300 MG capsule Take 1 capsule (300 mg total) by mouth 2 (two) times daily as needed (leg cramps). Patient taking differently: Take 600 mg by mouth at bedtime as needed (leg cramps). 05/31/21  Yes Vigg, Avanti, MD  hydrochlorothiazide (HYDRODIURIL) 25 MG tablet Take 1 tablet (25 mg total) by mouth daily. Patient not taking: Reported on 01/03/2022 01/21/21  Yes Cannady, Jolene T, NP  ibuprofen (ADVIL) 200 MG tablet Take 800 mg by mouth every 8 (eight) hours as needed for moderate pain. Patient not taking: Reported on 01/03/2022   Yes [provider]  levETIRAcetam (KEPPRA) 500 MG tablet TAKE 1 TABLET(500 MG) BY MOUTH DAILY 12/30/21  Yes Vigg, Avanti, MD  Lidocaine-Menthol (ICY HOT MAX LIDOCAINE EX) Apply 1 application topically daily as needed (pain).   Yes [provider]  LORazepam (ATIVAN) 1 MG tablet Take 0.5-1 tablets (0.5-1 mg total) by mouth daily as needed for  anxiety. 12/30/21  Yes Vigg, Avanti, MD  montelukast (SINGULAIR) 10 MG tablet Take 1 tablet (10 mg total) by mouth at bedtime. Patient taking differently: Take 10 mg by mouth daily as needed (allergies). 05/31/21  Yes Vigg, Avanti, MD  omeprazole (PRILOSEC OTC) 20 MG tablet Take 20 mg by mouth daily.   Yes [provider]  traMADol (ULTRAM) 50 MG tablet Take 50 mg by mouth every 4 (four) hours as needed for pain. 12/26/21  Yes [provider]  Vilazodone HCl (VIIBRYD) 40 MG TABS Take 1 tablet (40 mg total) by mouth daily. 12/30/21  Yes Vigg, Avanti, MD  Vilazodone HCl 20 MG TABS Take 40 mg by mouth daily.   Yes [provider]  Accu-Chek Softclix Lancets  lancets TEST TWO TO THREE TIMES DAILY 11/18/21   Cannady, Henrine Screws T, NP  Blood Glucose Monitoring Suppl (ONETOUCH VERIO) w/Device KIT Use to check blood sugar 2-3 times daily and document -- bring to provider visits.  Goal is <130 fasting and <180 2 hours after eating. Patient not taking: Reported on 01/03/2022 02/04/21   Marnee Guarneri T, NP  Ferrous Sulfate (IRON) 325 (65 Fe) MG TABS Take 1 tablet twice a day every other day. Patient not taking: Reported on 01/03/2022 10/13/21   Jonathon Bellows, MD  glucose blood [facility] VERIO) test strip Use to check blood sugar 2-3 times daily and document -- bring to provider visits. 02/04/21   Cannady, Henrine Screws T, NP     Positive ROS: All other systems have been reviewed and were otherwise negative with the exception of those mentioned in the HPI and as above.  Physical Exam: General: Alert, no acute distress Cardiovascular: Regular rate and rhythm, no murmurs rubs or gallops.  No pedal edema Respiratory: Clear to auscultation bilaterally, no wheezes rales or rhonchi. No cyanosis, no use of accessory musculature GI: No organomegaly, abdomen is soft and non-tender nondistended with positive bowel sounds. Skin: Skin intact, no lesions within the operative field. Neurologic: Sensation intact  distally Psychiatric: Patient is competent for consent with normal mood and affect Lymphatic: No cervical lymphadenopathy  MUSCULOSKELETAL: Left Knee: The patient's skin is intact. There is no erythema, ecchymosis, or large effusion. Her range of motion is from approximately 0 to 110-115 degrees of flexion. There is tenderness over the medial joint line, extending distally over the tibial condyle. She had pain with McMurray's testing, but no ligamentous laxity. She is neurovascularly intact.   Radiology: The findings on MRI are noted above including trizonal radial tear of the posterior horn of the medial meniscus with medial compartment arthropathy as well as patellofemoral arthropathy and capsulitis.   Assessment: left medial meniscus tear  Plan: Plan for Procedure(s): LEFT KNEE ARTHROSCOPY WITH PARTIAL MEDIAL MENISECTOMY  I reviewed the details of the operation as well as the postoperative course with patient today.  Preop history and physical was performed at the bedside.  I marked the left knee according to hospital's correct site of surgery protocol after verbally confirming with the patient that this was the correct site of surgery.  I discussed the risks and benefits of surgery. The risks include but are not limited to infection, bleeding, nerve or blood vessel injury, joint stiffness or loss of motion, persistent pain, weakness or instability, progressive osteoarthritis and the need for further surgery including a total knee arthroplasty. Patient understood these risks and wished to proceed.     Thornton Park, MD   01/12/2022 11:32 AM

## 2022-01-12 NOTE — Transfer of Care (Signed)
Immediate Anesthesia Transfer of Care Note  Patient: Amanda Wang  Procedure(s) Performed: KNEE ARTHROSCOPY WITH MEDIAL MENISECTOMY (Left: Knee)  Patient Location: PACU  Anesthesia Type:General  Level of Consciousness: sedated  Airway & Oxygen Therapy: Patient Spontanous Breathing and Patient connected to face mask oxygen  Post-op Assessment: Report given to RN and Post -op Vital signs reviewed and stable  Post vital signs: Reviewed and stable  Last Vitals:  Vitals Value Taken Time  BP 127//75   Temp    Pulse 78   Resp 15 01/12/22 1303  SpO2 100   Vitals shown include unvalidated device data.  Last Pain:  Vitals:   01/12/22 1011  TempSrc: Temporal  PainSc: 5          Complications: No notable events documented.

## 2022-01-12 NOTE — Anesthesia Postprocedure Evaluation (Signed)
Anesthesia Post Note  Patient: Amanda Wang  Procedure(s) Performed: KNEE ARTHROSCOPY WITH MEDIAL MENISECTOMY (Left: Knee)  Patient location during evaluation: PACU Anesthesia Type: General Level of consciousness: awake and alert Pain management: pain level controlled Vital Signs Assessment: post-procedure vital signs reviewed and stable Respiratory status: spontaneous breathing, nonlabored ventilation, respiratory function stable and patient connected to nasal cannula oxygen Cardiovascular status: blood pressure returned to baseline and stable Postop Assessment: no apparent nausea or vomiting Anesthetic complications: no   No notable events documented.   Last Vitals:  Vitals:   01/12/22 1400 01/12/22 1418  BP: (!) 152/69 (!) 166/81  Pulse: 85 92  Resp: 17 18  Temp: 36.7 C (!) 36.3 C  SpO2: 97% 98%    Last Pain:  Vitals:   01/12/22 1418  TempSrc: Temporal  PainSc: 5                  Martha Clan

## 2022-01-12 NOTE — Op Note (Signed)
PATIENT:  Amanda Wang  PRE-OPERATIVE DIAGNOSIS:  TEAR OF MEDIAL MENISCUS, LEFT KNEE  POST-OPERATIVE DIAGNOSIS:  Same  PROCEDURE: Left knee arthroscopic partial medial meniscectomy, partial synovectomy and chondroplasty of Amanda medial femoral condyle and undersurface of Amanda patella  SURGEON:  Thornton Park, MD  ANESTHESIA:   General  PREOPERATIVE INDICATIONS:  Thereasa Wang  71 y.o. female with a diagnosis of TEAR OF MEDIAL MENISCUS, left knee who failed conservative management and elected for surgical management.    Amanda risks benefits and alternatives were discussed with Amanda patient preoperatively including Amanda risks of infection, bleeding, nerve injury, knee stiffness, persistent pain, osteoarthritis and Amanda need for further surgery. Medical  risks include DVT and pulmonary embolism, myocardial infarction, stroke, pneumonia, respiratory failure and death. Amanda patient understood these risks and wished to proceed.  OPERATIVE FINDINGS: Unstable complete radial tear of Amanda posterior horn of Amanda medial meniscus near Amanda posterior root.  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  Focal grade 2/3 chondral lesion of Amanda trochlea and diffuse grade 2-3 chondromalacia of Amanda patella.  Diffuse synovitis.  OPERATIVE PROCEDURE: Patient was met in Amanda preoperative area. Amanda operative extremity was signed with Amanda word yes and my initials according Amanda hospital's correct site of surgery protocol.  Amanda patient was brought to Amanda operating room where they was placed supine on Amanda operative table. General anesthesia was administered. Amanda patient was prepped and draped in a sterile fashion.  A timeout was performed to verify Amanda patient's name, date of birth, medical record number, correct site of surgery correct procedure to be performed. It was also used to verify Amanda patient received antibiotics that all appropriate instruments, and radiographic studies were available in  Amanda room. Once all in attendance were in agreement, Amanda case began.  Proposed arthroscopy incisions were drawn out with a surgical marker. These were pre-injected with 1% lidocaine plain. An 11 blade was used to establish an inferior lateral and inferomedial portals. Amanda inferomedial portal was created using a 18-gauge spinal needle under direct visualization.  A full diagnostic examination of Amanda knee was performed including Amanda suprapatellar pouch, patellofemoral joint, medial lateral compartments as well as Amanda medial lateral gutters, Amanda intercondylar notch in Amanda posterior knee.  Findings on arthroscopy are noted above.  Patient had Amanda posterior meniscal tear treated with a [employer]yon[employer] flyer shaver blade and straight duckbill biter. Amanda meniscus was debrided until a stable rim was achieved. A chondroplasty of Amanda medial femoral condyle, trochlea and undersurface of patella was also performed using Amanda Dyonics flyer shaver blade. A partial synovectomy was also performed using  90 [employer] werewolf wand.  Amanda knee was then copiously lavaged. All arthroscopic instruments were removed. Amanda two arthroscopy portals were closed with 4-0 nylon. Steri-Strips were applied along with a dry sterile and compressive dressing. Amanda patient was brought to Amanda PACU in stable condition. I was scrubbed and present for Amanda entire case and all sharp and instrument counts were correct at Amanda conclusion Amanda case. I spoke with Amanda patient's family member postoperatively to let him know Amanda case was performed without complication and Amanda patient was stable in Amanda recovery room.    Amanda Gaul, MD

## 2022-01-12 NOTE — Discharge Instructions (Signed)
POLAR CARE INFORMATION  [email or web address]  How to use [employer] Cold Therapy System?   YouTube   [email or web address]  OPERATING INSTRUCTIONS  Start the product With dry hands, connect the transformer to the electrical connection located on the top of the cooler. Next, plug the transformer into an appropriate electrical outlet. The unit will automatically start running at this point.  To stop the pump, disconnect electrical power.  Unplug to stop the product when not in use. Unplugging the Polar Care unit turns it off. Always unplug immediately after use. Never leave it plugged in while unattended. Remove pad.    FIRST ADD WATER TO FILL LINE, THEN ICE---Replace ice when existing ice is almost melted  1 Discuss Treatment with your [place] Practitioner and Use Only as Prescribed 2 Apply Insulation Barrier & Cold Therapy Pad 3 Check for Moisture 4 Inspect Skin Regularly  Tips and Trouble Shooting Usage Tips 1. Use cubed or chunked ice for optimal performance. 2. It is recommended to drain the Pad between uses. To drain the pad, hold the Pad upright with the hose pointed toward the ground. Depress the black plunger and allow water to drain out. 3. You may disconnect the Pad from the unit without removing the pad from the affected area by depressing the silver tabs on the hose coupling and gently pulling the hoses apart. The Pad and unit will seal itself and will not leak. Note: Some dripping during release is normal. 4. DO NOT RUN PUMP WITHOUT WATER! The pump in this unit is designed to run with water. Running the unit without water will cause permanent damage to the pump. 5. Unplug unit before removing lid.  TROUBLESHOOTING GUIDE Pump not running, Water not flowing to the pad, Pad is not getting cold 1. Make sure the transformer is plugged into the wall outlet. 2. Confirm that the ice and water are filled to the indicated levels. 3. Make sure  there are no kinks in the pad. 4. Gently pull on the blue tube to make sure the tube/pad junction is straight. 5. Remove the pad from the treatment site and ll it while the pad is lying at; then reapply. 6. Confirm that the pad couplings are securely attached to the unit. Listen for the double clicks (Figure 1) to confirm the pad couplings are securely attached.  Leaks    Note: Some condensation on the lines, controller, and pads is unavoidable, especially in warmer climates. 1. If using a [employer] Polar Care Cold Therapy unit with a detachable Cold Therapy Pad, and a leak exists (other than condensation on the lines) disconnect the pad couplings. Make sure the silver tabs on the couplings are depressed before reconnecting the pad to the pump hose; then confirm both sides of the coupling are properly clicked in. 2. If the coupling continues to leak or a leak is detected in the pad itself, stop using it and call [place] at [phone].  Cleaning After use, empty and dry the unit with a soft cloth. Warm water and mild detergent may be used occasionally to clean the pump and tubes.  WARNING: The [place] can be cold enough to cause serious injury, including full skin necrosis. Follow these Operating Instructions, and carefully read the Product Insert (see pouch on side of unit) and the Cold Therapy Pad Fitting Instructions (provided with each Cold Therapy Pad) prior to use.

## 2022-01-13 ENCOUNTER — Encounter: Payer: Self-pay | Admitting: Orthopedic Surgery

## 2022-01-20 DIAGNOSIS — M25562 Pain in left knee: Secondary | ICD-10-CM | POA: Diagnosis not present

## 2022-01-24 DIAGNOSIS — M25562 Pain in left knee: Secondary | ICD-10-CM | POA: Diagnosis not present

## 2022-01-26 ENCOUNTER — Other Ambulatory Visit: Payer: Self-pay | Admitting: Nurse Practitioner

## 2022-01-26 DIAGNOSIS — I10 Essential (primary) hypertension: Secondary | ICD-10-CM

## 2022-01-26 DIAGNOSIS — M25562 Pain in left knee: Secondary | ICD-10-CM | POA: Diagnosis not present

## 2022-01-26 NOTE — Telephone Encounter (Signed)
Requested medication (s) are due for refill today -no/yes  Requested medication (s) are on the active medication list -no/yes  Future visit scheduled yes  Last refill: HCTZ- discontinued                 Vilazodone- listed twice- historical, 12/30/21 #90  Notes to clinic: Request RF: medication discontinued, medication listed twice- historical and recently filled  Requested Prescriptions  Pending Prescriptions Disp Refills   hydrochlorothiazide (HYDRODIURIL) 25 MG tablet [Pharmacy Med Name: HYDROCHLOROTHIAZIDE 25MG  TABLETS] 90 tablet 4    Sig: TAKE 1 TABLET(25 MG) BY MOUTH DAILY     Cardiovascular: Diuretics - Thiazide Failed - 01/26/2022  3:10 AM      Failed - K in normal range and within 180 days    Potassium  Date Value Ref Range Status  01/04/2022 3.3 (L) 3.5 - 5.1 mmol/L Final  07/24/2012 4.2 3.5 - 5.1 mmol/L Final          Failed - Last BP in normal range    BP Readings from Last 1 Encounters:  01/12/22 (!) 166/81          Passed - Cr in normal range and within 180 days    Creatinine  Date Value Ref Range Status  01/11/2021 54.2 20.0 - 300.0 mg/dL Final  07/24/2012 0.88 0.60 - 1.30 mg/dL Final   Creatinine, Ser  Date Value Ref Range Status  01/04/2022 0.76 0.44 - 1.00 mg/dL Final          Passed - Na in normal range and within 180 days    Sodium  Date Value Ref Range Status  01/04/2022 139 135 - 145 mmol/L Final  10/06/2021 139 134 - 144 mmol/L Final  07/24/2012 142 136 - 145 mmol/L Final          Passed - Valid encounter within last 6 months    Recent Outpatient Visits           3 weeks ago Need for influenza vaccination   [place] Vigg, Avanti, MD   3 months ago Cough in adult   [place] Vigg, Avanti, MD   3 months ago Fatigue, unspecified type   [facility] Vigg, Avanti, MD   4 months ago Essential hypertension   [place] Vigg, Avanti, MD   8 months ago Essential hypertension   [place], MD       Future Appointments             In 5 months Vigg, Avanti, MD [place], [facility]   In 8 months  [facility], [facility]             Vilazodone HCl 20 MG TABS [employer] Med Name: VILAZODONE 20MG  TABLETS] 90 tablet     Sig: TAKE 1 TABLET(20 MG) BY MOUTH DAILY     Psychiatry:  Antidepressants - SSRI Passed - 01/26/2022  3:10 AM      Passed - Completed PHQ-2 or PHQ-9 in the last 360 days      Passed - Valid encounter within last 6 months    Recent Outpatient Visits           3 weeks ago Need for influenza vaccination   [facility] Vigg, Avanti, MD   3 months ago Cough in adult   [place] Vigg, Avanti, MD   3 months ago Fatigue, unspecified type   [facility] Vigg, Avanti, MD  4 months ago Essential hypertension   [place] Vigg, Avanti, MD   8 months ago Essential hypertension   [place] Vigg, Avanti, MD       Future Appointments             In 5 months Vigg, Avanti, MD [facility], [facility]   In 8 months  [facility], [facility]               Requested Prescriptions  Pending Prescriptions Disp Refills   hydrochlorothiazide (HYDRODIURIL) 25 MG tablet [Pharmacy Med Name: HYDROCHLOROTHIAZIDE 25MG  TABLETS] 90 tablet 4    Sig: TAKE 1 TABLET(25 MG) BY MOUTH DAILY     Cardiovascular: Diuretics - Thiazide Failed - 01/26/2022  3:10 AM      Failed - K in normal range and within 180 days    Potassium  Date Value Ref Range Status  01/04/2022 3.3 (L) 3.5 - 5.1 mmol/L Final  07/24/2012 4.2 3.5 - 5.1 mmol/L Final          Failed - Last BP in normal range    BP Readings from Last 1 Encounters:  01/12/22 (!) 166/81          Passed - Cr in normal range and within 180 days    Creatinine  Date Value Ref Range Status  01/11/2021 54.2 20.0 - 300.0 mg/dL Final  07/24/2012 0.88 0.60 - 1.30 mg/dL Final   Creatinine, Ser  Date  Value Ref Range Status  01/04/2022 0.76 0.44 - 1.00 mg/dL Final          Passed - Na in normal range and within 180 days    Sodium  Date Value Ref Range Status  01/04/2022 139 135 - 145 mmol/L Final  10/06/2021 139 134 - 144 mmol/L Final  07/24/2012 142 136 - 145 mmol/L Final          Passed - Valid encounter within last 6 months    Recent Outpatient Visits           3 weeks ago Need for influenza vaccination   [facility] Vigg, Avanti, MD   3 months ago Cough in adult   [place] Vigg, Avanti, MD   3 months ago Fatigue, unspecified type   [facility] Vigg, Avanti, MD   4 months ago Essential hypertension   [place] Vigg, Avanti, MD   8 months ago Essential hypertension   [place], MD       Future Appointments             In 5 months Vigg, Avanti, MD [place], [facility]   In 8 months  [facility], [facility]             Vilazodone HCl 20 MG TABS [Pharmacy Med Name: VILAZODONE 20MG  TABLETS] 90 tablet     Sig: TAKE 1 TABLET(20 MG) BY MOUTH DAILY     Psychiatry:  Antidepressants - SSRI Passed - 01/26/2022  3:10 AM      Passed - Completed PHQ-2 or PHQ-9 in the last 360 days      Passed - Valid encounter within last 6 months    Recent Outpatient Visits           3 weeks ago Need for influenza vaccination   [facility] Vigg, Avanti, MD   3 months ago Cough in adult   [place], MD   3 months  ago Fatigue, unspecified type   [facility] Vigg, Avanti, MD   4 months ago Essential hypertension   [place] Vigg, Avanti, MD   8 months ago Essential hypertension   [place], MD       Future Appointments             In 5 months Vigg, Avanti, MD [facility], [facility]   In 8 months  [facility], [place]

## 2022-02-01 DIAGNOSIS — M25562 Pain in left knee: Secondary | ICD-10-CM | POA: Diagnosis not present

## 2022-02-06 DIAGNOSIS — M25562 Pain in left knee: Secondary | ICD-10-CM | POA: Diagnosis not present

## 2022-02-09 DIAGNOSIS — M25562 Pain in left knee: Secondary | ICD-10-CM | POA: Diagnosis not present

## 2022-02-16 DIAGNOSIS — M25562 Pain in left knee: Secondary | ICD-10-CM | POA: Diagnosis not present

## 2022-02-20 DIAGNOSIS — M25562 Pain in left knee: Secondary | ICD-10-CM | POA: Diagnosis not present

## 2022-02-23 DIAGNOSIS — M25562 Pain in left knee: Secondary | ICD-10-CM | POA: Diagnosis not present

## 2022-02-27 DIAGNOSIS — M25562 Pain in left knee: Secondary | ICD-10-CM | POA: Diagnosis not present

## 2022-03-02 DIAGNOSIS — M25562 Pain in left knee: Secondary | ICD-10-CM | POA: Diagnosis not present

## 2022-03-27 ENCOUNTER — Ambulatory Visit: Payer: Medicare PPO | Admitting: Physician Assistant

## 2022-03-27 ENCOUNTER — Encounter: Payer: Self-pay | Admitting: Physician Assistant

## 2022-03-27 ENCOUNTER — Ambulatory Visit: Payer: Self-pay

## 2022-03-27 VITALS — BP 180/98 | HR 88 | Temp 98.9°F | Wt 172.8 lb

## 2022-03-27 DIAGNOSIS — I10 Essential (primary) hypertension: Secondary | ICD-10-CM | POA: Diagnosis not present

## 2022-03-27 MED ORDER — AMLODIPINE BESYLATE 5 MG PO TABS: 5.0000 mg | ORAL_TABLET | Freq: Every day | ORAL | 0 refills | Status: DC

## 2022-03-27 NOTE — Telephone Encounter (Signed)
?  Chief Complaint: HTN ?Symptoms: BP 193/92, headache ?Frequency:  ongoing for several weeks ?Pertinent Negatives: NA ?Disposition: '[]'$ ED /'[]'$ Urgent Care (no appt availability in office) / '[x]'$ Appointment(In office/virtual)/ '[]'$  [facility]/ '[]'$ Home Care/ '[]'$ Refused Recommended Disposition /'[]'$ [place] [facility]/ '[]'$  Follow-up with PCP ?Additional Notes: pt called in, she states that she takes her BP occasionally and every time she has checked it lately its been high. She was removed off of amlodipine several months ago and she states since then has just been going up. Scheduled OV for today at 1420 with [place], [place].  ? ?Reason for Disposition ? Systolic BP  >= 177 OR Diastolic >= 939 ? ?Answer Assessment - Initial Assessment Questions ?1. BLOOD PRESSURE: "What is the blood pressure?" "Did you take at least two measurements 5 minutes apart?" ?    193/92 ?2. ONSET: "When did you take your blood pressure?" ?    Just now ?3. HOW: "How did you obtain the blood pressure?" (e.g., visiting nurse, automatic home BP monitor) ?    Automatic cuff ?4. HISTORY: "Do you have a history of high blood pressure?" ?    yes ?5. MEDICATIONS: "Are you taking any medications for blood pressure?" "Have you missed any doses recently?" ?    Yes and no ?6. OTHER SYMPTOMS: "Do you have any symptoms?" (e.g., headache, chest pain, blurred vision, difficulty breathing, weakness) ?    headache ? ?Protocols used: Blood Pressure - High-A-AH ? ?

## 2022-03-27 NOTE — Progress Notes (Signed)
? ?Established Patient Office Visit ? ?Subjective:  ?Patient ID: Amanda Wang, female    DOB: [birth year]  Age: 71 y.o. MRN: [id number] ? ?Today's Provider: Talitha Givens, MHS, PA-C ?Introduced myself to the patient as a Journalist, newspaper and provided education on APPs in clinical practice.  ? ? ? ?CC:  ?Chief Complaint  ?Patient presents with  ? Hypertension  ?  States she has had high BP readings recently x few months. Denies N/V/D, no chest pain either. Denies blurry vision   ? Headache  ?  Intermittent but concerns her with high BP. No headache during triage   ? ? ?HPI ?Thereasa Distance presents for concern for elevated BP with headaches ? ?States she doesn't check her BP very often but when she has checked it it was in the 150s-170s/90s  ? ?She was taken off her Amlodipine because her diastolic BP was low ?She is concerned because she has been getting BP readings that are elevated and she is having headaches  ? ? ? ?Past Medical History:  ?Diagnosis Date  ? Advanced care planning/counseling discussion 09/16/2018  ? Arthritis   ? Chiari malformation type I ([place])   ? Depression   ? GERD (gastroesophageal reflux disease)   ? History of hiatal hernia   ? Hyperlipidemia   ? Hypertension   ? PONV (postoperative nausea and vomiting)   ? Sleep apnea   ? Small intestinal bacterial overgrowth   ? Spinal stenosis   ? Vaccine reaction, initial encounter 03/11/2020  ? ? ?Past Surgical History:  ?Procedure Laterality Date  ? BREAST BIOPSY Right   ? neg  ? chiari malformation I decompression surgery  11-06  ? COLONOSCOPY WITH PROPOFOL N/A 11/28/2018  ? Procedure: COLONOSCOPY WITH PROPOFOL;  Surgeon: Jonathon Bellows, MD;  Location: [facility] ENDOSCOPY;  Service: Gastroenterology;  Laterality: N/A;  ? COLONOSCOPY WITH PROPOFOL N/A 10/27/2021  ? Procedure: COLONOSCOPY WITH PROPOFOL;  Surgeon: Jonathon Bellows, MD;  Location: [facility] ENDOSCOPY;  Service: Gastroenterology;  Laterality: N/A;  ? deviated septum repair    ? ESOPHAGOGASTRODUODENOSCOPY N/A  10/27/2021  ? Procedure: ESOPHAGOGASTRODUODENOSCOPY (EGD);  Surgeon: Jonathon Bellows, MD;  Location[facility] ENDOSCOPY;  Service: Gastroenterology;  Laterality: N/A;  ? HERNIA REPAIR    ? KNEE ARTHROSCOPY WITH MEDIAL MENISECTOMY Left 01/12/2022  ? Procedure: KNEE ARTHROSCOPY WITH MEDIAL MENISECTOMY;  Surgeon: Thornton Park, MD;  Location: [facility] ORS;  Service: Orthopedics;  Laterality: Left;  ? ? ?Family History  ?Problem Relation Age of Onset  ? Hypertension Mother   ? Cancer Mother   ? Stroke Mother   ? Heart disease Father   ? Breast cancer Sister 30  ? Breast cancer Paternal Aunt   ? Diabetes Paternal Grandmother   ? ? ?Social History  ? ?Socioeconomic History  ? Marital status: Divorced  ?  Spouse name: Not on file  ? Number of children: Not on file  ? Years of education: Not on file  ? Highest education level: Bachelor's degree (e.g., BA, AB, BS)  ?Occupational History  ? Occupation: part time   ?Tobacco Use  ? Smoking status: Former  ?  Packs/day: 0.50  ?  Years: 1.00  ?  Pack years: 0.50  ?  Types: Cigarettes  ?  Quit date: 07/26/1977  ?  Years since quitting: 44.6  ? Smokeless tobacco: Never  ? Tobacco comments:  ?  barley smoked when smoked   ?Vaping Use  ? Vaping Use: Never used  ?Substance and Sexual Activity  ? Alcohol use:  Yes  ?  Alcohol/week: 0.0 standard drinks  ?  Comment: on occasion 1-2 x month  ? Drug use: No  ? Sexual activity: Yes  ?Other Topics Concern  ? Not on file  ?Social History Narrative  ? Lives alone.  ? ?Social Determinants of Health  ? ?Financial Resource Strain: Low Risk   ? Difficulty of Paying Living Expenses: Not hard at all  ?Food Insecurity: No Food Insecurity  ? Worried About Charity fundraiser in the Last Year: Never true  ? Ran Out of Food in the Last Year: Never true  ?Transportation Needs: No Transportation Needs  ? Lack of Transportation (Medical): No  ? Lack of Transportation (Non-Medical): No  ?Physical Activity: Inactive  ? Days of Exercise per Week: 0 days  ? Minutes of  Exercise per Session: 0 min  ?Stress: Stress Concern Present  ? Feeling of Stress : To some extent  ?Social Connections: Not on file  ?Intimate Partner Violence: Not on file  ? ? ?Outpatient Medications Prior to Visit  ?Medication Sig Dispense Refill  ? acyclovir (ZOVIRAX) 200 MG capsule Take 200 mg by mouth 5 (five) times daily as needed (fever blisters).    ? atorvastatin (LIPITOR) 40 MG tablet Take 1 tablet (40 mg total) by mouth daily at 6 PM. Need appt with PCP prior to refill 90 tablet 1  ? benazepril (LOTENSIN) 40 MG tablet Take 1 tablet (40 mg total) by mouth daily. 30 tablet 4  ? Carboxymethylcellul-Glycerin (LUBRICATING EYE DROPS OP) Place 1 drop into both eyes daily as needed (dry eyes).    ? Charcoal Activated (ACTIVATED CHARCOAL PO) Take 2 capsules by mouth as needed (gas).    ? cyclobenzaprine (FLEXERIL) 10 MG tablet take 1 tablet by mouth three times a day if needed for muscle spasm 90 tablet 1  ? gabapentin (NEURONTIN) 300 MG capsule Take 1 capsule (300 mg total) by mouth 2 (two) times daily as needed (leg cramps). (Patient taking differently: Take 600 mg by mouth at bedtime as needed (leg cramps).) 180 capsule 1  ? levETIRAcetam (KEPPRA) 500 MG tablet TAKE 1 TABLET(500 MG) BY MOUTH DAILY 90 tablet 1  ? LORazepam (ATIVAN) 1 MG tablet Take 0.5-1 tablets (0.5-1 mg total) by mouth daily as needed for anxiety. 30 tablet 2  ? montelukast (SINGULAIR) 10 MG tablet Take 1 tablet (10 mg total) by mouth at bedtime. (Patient taking differently: Take 10 mg by mouth daily as needed (allergies).) 90 tablet 1  ? omeprazole (PRILOSEC OTC) 20 MG tablet Take 20 mg by mouth daily.    ? Vilazodone HCl (VIIBRYD) 40 MG TABS Take 1 tablet (40 mg total) by mouth daily. 90 tablet 0  ? Accu-Chek Softclix Lancets lancets TEST TWO TO THREE TIMES DAILY (Patient not taking: Reported on 03/27/2022) 100 each 2  ? albuterol (VENTOLIN HFA) 108 (90 Base) MCG/ACT inhaler INHALE 2 PUFFS INTO THE LUNGS EVERY 6 HOURS AS NEEDED FOR WHEEZING  OR SHORTNESS OF BREATH (Patient not taking: Reported on 03/27/2022) 6.7 g 0  ? Lidocaine-Menthol (ICY HOT MAX LIDOCAINE EX) Apply 1 application topically daily as needed (pain). (Patient not taking: Reported on 03/27/2022)    ? aspirin EC 325 MG tablet Take 1 tablet (325 mg total) by mouth daily. (Patient not taking: Reported on 03/27/2022) 30 tablet 0  ? docusate sodium (COLACE) 100 MG capsule Take 1 capsule (100 mg total) by mouth 2 (two) times daily. (Patient not taking: Reported on 03/27/2022) 30 capsule 2  ?  glucose blood (ONETOUCH VERIO) test strip Use to check blood sugar 2-3 times daily and document -- bring to provider visits. (Patient not taking: Reported on 03/27/2022) 100 each 12  ? HYDROcodone-acetaminophen (NORCO) 5-325 MG tablet Take 1 tablet by mouth every 4 (four) hours as needed for moderate pain. (Patient not taking: Reported on 03/27/2022) 6 tablet 0  ? ondansetron (ZOFRAN) 4 MG tablet Take 1 tablet (4 mg total) by mouth every 8 (eight) hours as needed for nausea or vomiting. (Patient not taking: Reported on 03/27/2022) 30 tablet 0  ? Vilazodone HCl 20 MG TABS Take 40 mg by mouth daily. (Patient not taking: Reported on 03/27/2022)    ? ?No facility-administered medications prior to visit.  ? ? ?Allergies  ?Allergen Reactions  ? Fexofenadine Anxiety  ?  Makes her have a panic attack  ? Codeine Nausea Only  ?  Dizziness  ? Methylphenidate Hcl   ?  "Caused a seizure"  ? Zoloft [Sertraline] Other (See Comments)  ?  Sleepiness  ? Magnevist [Gadopentetate] Hives and Itching  ?  IV Contrast  ? Sulfa Antibiotics Rash  ? ? ?ROS ?Review of Systems  ?Constitutional:  Negative for chills and diaphoresis.  ?HENT:  Negative for tinnitus.   ?Eyes:  Negative for visual disturbance.  ?Cardiovascular:  Negative for chest pain, palpitations and leg swelling.  ?Musculoskeletal:  Negative for back pain.  ?Neurological:  Positive for headaches. Negative for dizziness, syncope, speech difficulty and light-headedness.   ?Psychiatric/Behavioral:  Negative for confusion.   ? ?  ?Objective:  ?  ?Physical Exam ?Vitals reviewed.  ?Constitutional:   ?   General: She is awake.  ?   Appearance: Normal appearance. She is well-developed and well-

## 2022-03-27 NOTE — Patient Instructions (Addendum)
Based on your Blood Pressure today I am starting you back on Amlodipine 5 mg to be taken by mouth once per day  ? ?Try to reduce your sodium intake and stay well hydrated.  ? ?I would like you to take your blood pressure a few times per week and record the readings so you can bring it back with you to follow up  ? ?I would like you to return to the office in 4 weeks to review where we are at so we can make sure your Blood pressure is returning to normal  ? ?Please go to the ED if you notice the following: Blood pressures greater than 180/95, Elevated blood pressures with headache, changes to vision, chest pain, back pain, trouble urinating or blood in your urine, confusion ? ?

## 2022-03-27 NOTE — Assessment & Plan Note (Signed)
Chronic, historic problem with current exacerbation  ?She is currently taking Benazepril 40 mg PO QD  ?She states she has been taken off two BP medications in the last few months and has noticed her BP gradually increasing  ?Recommend we add Amlodipine 5 mg PO QD today to assist with BP control ?Recommend she return in 4 weeks with BP measures to assess progress on new medication regimen ?ED precautions provided  ?Follow up as needed for persistent or worsening symptoms.  ?

## 2022-03-30 ENCOUNTER — Ambulatory Visit: Payer: Medicare PPO | Admitting: Family Medicine

## 2022-04-07 ENCOUNTER — Other Ambulatory Visit: Payer: Self-pay | Admitting: Internal Medicine

## 2022-04-07 NOTE — Telephone Encounter (Signed)
Requested Prescriptions  ?Pending Prescriptions Disp Refills  ?? Vilazodone HCl (VIIBRYD) 40 MG TABS [Pharmacy Med Name: VILAZODONE '40MG'$  TABLETS] 90 tablet 0  ?  Sig: TAKE 1 TABLET(40 MG) BY MOUTH DAILY  ?  ? Psychiatry:  Antidepressants - SSRI Passed - 04/07/2022  7:28 AM  ?  ?  Passed - Completed PHQ-2 or PHQ-9 in the last 360 days  ?  ?  Passed - Valid encounter within last 6 months  ?  Recent Outpatient Visits   ?      ? 1 week ago Essential hypertension  ? [place], PA-C  ? 3 months ago Need for influenza vaccination  ? [facility] Vigg, Avanti, MD  ? 5 months ago Cough in adult  ? [facility] Vigg, Avanti, MD  ? 6 months ago Fatigue, unspecified type  ? [facility] Vigg, Avanti, MD  ? 6 months ago Essential hypertension  ? [facility] Vigg, Avanti, MD  ?  ?  ?Future Appointments   ?        ? In 2 weeks Vigg, Avanti, MD [facility], [facility]  ? In 2 months Vigg, Avanti, MD [facility], [facility]  ? In 5 months [place], [facility] [place]  ? In 6 months  [place], [facility]  ?  ? ?  ?  ?  ? ? ?

## 2022-04-25 ENCOUNTER — Encounter: Payer: Self-pay | Admitting: Internal Medicine

## 2022-04-25 ENCOUNTER — Ambulatory Visit: Payer: Medicare PPO | Admitting: Internal Medicine

## 2022-04-25 VITALS — BP 125/78 | HR 90 | Temp 98.2°F | Ht 62.21 in | Wt 174.0 lb

## 2022-04-25 DIAGNOSIS — E785 Hyperlipidemia, unspecified: Secondary | ICD-10-CM | POA: Diagnosis not present

## 2022-04-25 DIAGNOSIS — E119 Type 2 diabetes mellitus without complications: Secondary | ICD-10-CM | POA: Insufficient documentation

## 2022-04-25 DIAGNOSIS — I10 Essential (primary) hypertension: Secondary | ICD-10-CM | POA: Diagnosis not present

## 2022-04-25 DIAGNOSIS — I152 Hypertension secondary to endocrine disorders: Secondary | ICD-10-CM | POA: Diagnosis not present

## 2022-04-25 DIAGNOSIS — E1159 Type 2 diabetes mellitus with other circulatory complications: Secondary | ICD-10-CM | POA: Diagnosis not present

## 2022-04-25 DIAGNOSIS — F4310 Post-traumatic stress disorder, unspecified: Secondary | ICD-10-CM | POA: Diagnosis not present

## 2022-04-25 LAB — URINALYSIS, ROUTINE W REFLEX MICROSCOPIC
Bilirubin, UA: NEGATIVE
Glucose, UA: NEGATIVE
Ketones, UA: NEGATIVE
Leukocytes,UA: NEGATIVE
Nitrite, UA: NEGATIVE
Protein,UA: NEGATIVE
RBC, UA: NEGATIVE
Specific Gravity, UA: >1.03 — ABNORMAL HIGH (ref 1.005–1.030)
Urobilinogen, Ur: 0.2 mg/dL (ref 0.2–1.0)
pH, UA: 5.5 (ref 5.0–7.5)

## 2022-04-25 LAB — BAYER DCA HB A1C WAIVED: HB A1C (BAYER DCA - WAIVED): 5.8 % — ABNORMAL HIGH (ref 4.8–5.6)

## 2022-04-25 MED ORDER — VILAZODONE HCL 40 MG PO TABS
40.0000 mg | ORAL_TABLET | Freq: Every day | ORAL | 0 refills | Status: DC
Start: 2022-04-25 — End: 2022-10-09

## 2022-04-25 MED ORDER — AMLODIPINE BESYLATE 5 MG PO TABS: 5.0000 mg | ORAL_TABLET | Freq: Every day | ORAL | 2 refills | Status: DC

## 2022-04-25 MED ORDER — ATORVASTATIN CALCIUM 40 MG PO TABS: 40.0000 mg | ORAL_TABLET | Freq: Every day | ORAL | 2 refills | Status: DC
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
## 2022-04-26 LAB — BASIC METABOLIC PANEL
BUN/Creatinine Ratio: 27 (ref 12–28)
BUN: 22 mg/dL (ref 8–27)
CO2: 25 mmol/L (ref 20–29)
Calcium: 9.4 mg/dL (ref 8.7–10.3)
Chloride: 106 mmol/L (ref 96–106)
Creatinine, Ser: 0.81 mg/dL (ref 0.57–1.00)
Glucose: 119 mg/dL — ABNORMAL HIGH (ref 70–99)
Potassium: 4.3 mmol/L (ref 3.5–5.2)
Sodium: 143 mmol/L (ref 134–144)
eGFR: 79 mL/min/{1.73_m2} (ref >59)

## 2022-04-26 LAB — VITAMIN B12: Vitamin B-12: 515 pg/mL (ref 232–1245)

## 2022-05-31 ENCOUNTER — Ambulatory Visit: Payer: Medicare PPO | Admitting: Family Medicine

## 2022-05-31 ENCOUNTER — Encounter: Payer: Self-pay | Admitting: Family Medicine

## 2022-05-31 VITALS — BP 155/66 | HR 89 | Ht 62.21 in | Wt 175.2 lb

## 2022-05-31 DIAGNOSIS — E782 Mixed hyperlipidemia: Secondary | ICD-10-CM

## 2022-05-31 DIAGNOSIS — F339 Major depressive disorder, recurrent, unspecified: Secondary | ICD-10-CM | POA: Diagnosis not present

## 2022-05-31 DIAGNOSIS — K219 Gastro-esophageal reflux disease without esophagitis: Secondary | ICD-10-CM

## 2022-05-31 DIAGNOSIS — G935 Compression of brain: Secondary | ICD-10-CM

## 2022-05-31 DIAGNOSIS — G2581 Restless legs syndrome: Secondary | ICD-10-CM | POA: Diagnosis not present

## 2022-05-31 DIAGNOSIS — R7303 Prediabetes: Secondary | ICD-10-CM | POA: Diagnosis not present

## 2022-05-31 DIAGNOSIS — I10 Essential (primary) hypertension: Secondary | ICD-10-CM

## 2022-05-31 DIAGNOSIS — F4310 Post-traumatic stress disorder, unspecified: Secondary | ICD-10-CM | POA: Diagnosis not present

## 2022-05-31 MED ORDER — OMEPRAZOLE 40 MG PO CPDR: 40.0000 mg | DELAYED_RELEASE_CAPSULE | Freq: Every day | ORAL | 3 refills | Status: DC

## 2022-05-31 MED ORDER — CYCLOBENZAPRINE HCL 10 MG PO TABS: ORAL_TABLET | ORAL | 1 refills | Status: DC

## 2022-05-31 MED ORDER — GABAPENTIN 600 MG PO TABS: 600.0000 mg | ORAL_TABLET | Freq: Every day | ORAL | 3 refills | Status: DC

## 2022-05-31 MED ORDER — LEVETIRACETAM 500 MG PO TABS: 500.0000 mg | ORAL_TABLET | Freq: Two times a day (BID) | ORAL | 3 refills | Status: DC

## 2022-05-31 MED ORDER — LORAZEPAM 1 MG PO TABS: 0.5000 mg | ORAL_TABLET | Freq: Every day | ORAL | 2 refills | Status: DC | PRN

## 2022-05-31 MED ORDER — VALSARTAN 160 MG PO TABS: 160.0000 mg | ORAL_TABLET | Freq: Every day | ORAL | 1 refills | Status: DC

## 2022-05-31 MED ORDER — HYDROCHLOROTHIAZIDE 12.5 MG PO TABS: 12.5000 mg | ORAL_TABLET | Freq: Every day | ORAL | 1 refills | Status: DC

## 2022-05-31 NOTE — Progress Notes (Addendum)
 Subjective:    Patient ID: Amanda Wang, female    DOB: 09/16/1951, 71 y.o.   MRN: [id number]  Amanda Wang is a 71 y.o. female presenting on 05/31/2022 for Establish Care  Here to establish care, previously Dr Crissman [facility] for 20+ years, now needs new provider.  HPI  Major Depression recurrent  Generalized Anxiety / Panic Attacks Taking Lorazepam 1mg taking half or whole tab PRN anxiety panic, rarely takes, usually fills rx 1-2 times per year. Admits insomnia can affect her and she takes it PRN to help sleep as well. On Viibryd 40mg daily Followed by counselor therapist, she has upcoming apt with [facility].  Left Knee Pain, s/p meniscus repair arthroscopic Followed by Dr Krasinski at [facility], 11/2021 Osteoarthritis identified in knee Prior injection therapy cortisone in past. Still having issues with swelling in knees. On Meloxicam 7.5mg twice a day PRN.  CHRONIC HTN: Reports that her BP med as adjusted end of last year she was taken off some med and then taken off Amlodipine, then BP elevated off meds, and put back on meds. Admits headaches if too high BP BP related to stress. 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 Previously on Atenolol and it was successful for her but caused HR to go low to 40 at one point Past on HCTZ 25mg but had cramps Admits pain and swelling in Left knee leg impacting her Current Meds - Amlodipine 5mg daily at PM (previously on 5 but chart shows 10mg at one point), Benazepril 40mg daily (previously at 20mg then it was inc to 40mg) Reports good compliance, took meds today. Tolerating well, w/o complaints. Denies CP, dyspnea, HA, edema, dizziness / lightheadedness  Fam history of Hemorrhagic CVA mother with complications.  History of Anemia Prior GI Polyps, removed and resolved  History of chronic tiredness  A1c 5.8 improved  Chiari Malformation Surgery Type 1 Issue with balance Prior surgery to repair w/  patch  HYPERLIPIDEMIA: - Reports no concerns. Last lipid panel >1 year ago - Currently taking Atorvastatin 40mg nightly, tolerating well without side effects or myalgias  Cold Sores Acyclovir PRN for cold sores, ordered by dentist.      05/31/2022   11:14 AM 04/25/2022    4:41 PM 03/27/2022    2:34 PM  Depression screen PHQ 2/9  Decreased Interest 1 1 1  Down, Depressed, Hopeless 1 1 1  PHQ - 2 Score 2 2 2  Altered sleeping 3 3 1  Tired, decreased energy 2 2 1  Change in appetite 2 2 1  Feeling bad or failure about yourself  1 1 0  Trouble concentrating 0 0 1  Moving slowly or fidgety/restless 0 0 0  Suicidal thoughts 0 0 0  PHQ-9 Score 10 10 6  Difficult doing work/chores Not difficult at all Not difficult at all Not difficult at all      05/31/2022   11:14 AM 04/25/2022    4:42 PM 03/27/2022    2:34 PM 12/30/2021   11:27 AM  GAD 7 : Generalized Anxiety Score  Nervous, Anxious, on Edge 2 2 1 0  Control/stop worrying 2 2 1 0  Worry too much - different things 2 2 1 0  Trouble relaxing 0 0 0 0  Restless 0 0 0 0  Easily annoyed or irritable 0 0 1 0  Afraid - awful might happen 1 1 1 0  Total GAD 7 Score 7 7 5 0  Anxiety Difficulty   Not difficult at all Not difficult at all Not difficult at all Not difficult at all      Past Medical History:  Diagnosis Date  . Advanced care planning/counseling discussion 09/16/2018  . Arthritis   . Chiari malformation type I (HCC)   . Depression   . GERD (gastroesophageal reflux disease)   . History of hiatal hernia   . Hyperlipidemia   . Hypertension   . PONV (postoperative nausea and vomiting)   . Sleep apnea   . Small intestinal bacterial overgrowth   . Spinal stenosis   . Vaccine reaction, initial encounter 03/11/2020   Past Surgical History:  Procedure Laterality Date  . BREAST BIOPSY Right    neg  . chiari malformation I decompression surgery  11-06  . COLONOSCOPY WITH PROPOFOL N/A 11/28/2018   Procedure: COLONOSCOPY  WITH PROPOFOL;  Surgeon: Anna, Kiran, MD;  Location: [facility];  Service: Gastroenterology;  Laterality: N/A;  . COLONOSCOPY WITH PROPOFOL N/A 10/27/2021   Procedure: COLONOSCOPY WITH PROPOFOL;  Surgeon: Anna, Kiran, MD;  Location: [facility];  Service: Gastroenterology;  Laterality: N/A;  . deviated septum repair    . ESOPHAGOGASTRODUODENOSCOPY N/A 10/27/2021   Procedure: ESOPHAGOGASTRODUODENOSCOPY (EGD);  Surgeon: Anna, Kiran, MD;  Location: [facility];  Service: Gastroenterology;  Laterality: N/A;  . HERNIA REPAIR    . KNEE ARTHROSCOPY WITH MEDIAL MENISECTOMY Left 01/12/2022   Procedure: KNEE ARTHROSCOPY WITH MEDIAL MENISECTOMY;  Surgeon: Krasinski, Kevin, MD;  Location: [facility] OR[facility];  Service: Orthopedics;  Laterality: Left;   Social History   Socioeconomic History  . Marital status: Divorced    Spouse name: Not on file  . Number of children: Not on file  . Years of education: Not on file  . Highest education level: Bachelor's degree (e.g., BA, AB, BS)  Occupational History  . Occupation: part time   Tobacco Use  . Smoking status: Former    Packs/day: 0.50    Years: 1.00    Total pack years: 0.50    Types: Cigarettes    Quit date: 07/26/1977    Years since quitting: 44.8  . Smokeless tobacco: Never  . Tobacco comments:    barley smoked when smoked   Vaping Use  . Vaping Use: Never used  Substance and Sexual Activity  . Alcohol use: Yes    Alcohol/week: 0.0 standard drinks of alcohol    Comment: on occasion 1-2 x month  . Drug use: No  . Sexual activity: Yes  Other Topics Concern  . Not on file  Social History Narrative   Lives alone.   Social Determinants of Health   Financial Resource Strain: Low Risk  (10/17/2021)   Overall Financial Resource Strain (CARDIA)   . Difficulty of Paying Living Expenses: Not hard at all  Food Insecurity: No Food Insecurity (10/17/2021)   Hunger Vital Sign   . Worried About Running Out of Food in the Last Year: Never true   .  Ran Out of Food in the Last Year: Never true  Transportation Needs: No Transportation Needs (10/17/2021)   PRAPARE - Transportation   . Lack of Transportation (Medical): No   . Lack of Transportation (Non-Medical): No  Physical Activity: Inactive (10/17/2021)   Exercise Vital Sign   . Days of Exercise per Week: 0 days   . Minutes of Exercise per Session: 0 min  Stress: Stress Concern Present (10/17/2021)   [employer] - Occupational Stress Questionnaire   . Feeling of Stress : To some extent    Social Connections: Moderately Integrated (09/22/2019)   Social Connection and Isolation Panel [NHANES]   . Frequency of Communication with Friends and Family: More than three times a week   . Frequency of Social Gatherings with Friends and Family: More than three times a week   . Attends Religious Services: More than 4 times per year   . Active Member of Clubs or Organizations: Yes   . Attends Archivist Meetings: More than 4 times per year   . Marital Status: Divorced  Human resources officer Violence: Not At Risk (09/22/2019)   Humiliation, Afraid, Rape, and Kick questionnaire   . Fear of Current or Ex-Partner: No   . Emotionally Abused: No   . Physically Abused: No   . Sexually Abused: No   Family History  Problem Relation Age of Onset  . Hypertension Mother   . Cancer Mother   . Stroke Mother   . Heart disease Father   . Breast cancer Sister 75  . Breast cancer Paternal Aunt   . Diabetes Paternal Grandmother    Current Outpatient Medications on File Prior to Visit  Medication Sig  . Accu-Chek Softclix Lancets lancets TEST TWO TO THREE TIMES DAILY  . acyclovir (ZOVIRAX) 200 MG capsule Take 200 mg by mouth 5 (five) times daily as needed (fever blisters).  Marland Kitchen amLODipine (NORVASC) 5 MG tablet Take 1 tablet (5 mg total) by mouth daily.  Marland Kitchen atorvastatin (LIPITOR) 40 MG tablet Take 1 tablet (40 mg total) by mouth daily at 6 PM. Need appt with PCP prior to  refill  . Carboxymethylcellul-Glycerin (LUBRICATING EYE DROPS OP) Place 1 drop into both eyes daily as needed (dry eyes).  . Charcoal Activated (ACTIVATED CHARCOAL PO) Take 2 capsules by mouth as needed (gas).  . Lidocaine-Menthol (ICY HOT MAX LIDOCAINE EX) Apply 1 application. topically daily as needed (pain).  . meloxicam (MOBIC) 7.5 MG tablet meloxicam 7.5 mg tablet  TAKE 1 TABLET BY MOUTH TWICE DAILY  . Vilazodone HCl (VIIBRYD) 40 MG TABS Take 1 tablet (40 mg total) by mouth daily.   No current facility-administered medications on file prior to visit.    Review of Systems Per HPI unless specifically indicated above     Objective:    BP (!) 155/66   Pulse 89   Ht 5' 2.21" (1.58 m)   Wt 175 lb 3.2 oz (79.5 kg)   SpO2 97%   BMI 31.83 kg/m   Wt Readings from Last 3 Encounters:  05/31/22 175 lb 3.2 oz (79.5 kg)  04/25/22 174 lb (78.9 kg)  03/27/22 172 lb 12.8 oz (78.4 kg)    Physical Exam Vitals and nursing note reviewed.  Constitutional:      General: She is not in acute distress.    Appearance: She is well-developed. She is not diaphoretic.     Comments: Well-appearing, comfortable, cooperative  HENT:     Head: Normocephalic and atraumatic.  Eyes:     General:        Right eye: No discharge.        Left eye: No discharge.     Conjunctiva/sclera: Conjunctivae normal.  Neck:     Thyroid: No thyromegaly.  Cardiovascular:     Rate and Rhythm: Normal rate and regular rhythm.     Heart sounds: Normal heart sounds. No murmur heard. Pulmonary:     Effort: Pulmonary effort is normal. No respiratory distress.     Breath sounds: Normal breath sounds. No wheezing or rales.  Musculoskeletal:  General: Normal range of motion.     Cervical back: Normal range of motion and neck supple.     Right lower leg: No edema.     Left lower leg: Edema (trace) present.  Lymphadenopathy:     Cervical: No cervical adenopathy.  Skin:    General: Skin is warm and dry.     Findings:  No erythema or rash.  Neurological:     Mental Status: She is alert and oriented to person, place, and time.  Psychiatric:        Behavior: Behavior normal.     Comments: Well groomed, good eye contact, normal speech and thoughts     Results for orders placed or performed in visit on 04/25/22  Bayer DCA Hb A1c Waived  Result Value Ref Range   HB A1C (BAYER DCA - WAIVED) 5.8 (H) 4.8 - 5.6 %  Basic metabolic panel  Result Value Ref Range   Glucose 119 (H) 70 - 99 mg/dL   BUN 22 8 - 27 mg/dL   Creatinine, Ser 0.81 0.57 - 1.00 mg/dL   eGFR 79 >59 mL/min/1.73   BUN/Creatinine Ratio 27 12 - 28   Sodium 143 134 - 144 mmol/L   Potassium 4.3 3.5 - 5.2 mmol/L   Chloride 106 96 - 106 mmol/L   CO2 25 20 - 29 mmol/L   Calcium 9.4 8.7 - 10.3 mg/dL  Vitamin B12  Result Value Ref Range   Vitamin B-12 515 232 - 1,245 pg/mL  Urinalysis, Routine w reflex microscopic  Result Value Ref Range   Specific Gravity, UA >1.030 (H) 1.005 - 1.030   pH, UA 5.5 5.0 - 7.5   Color, UA Yellow Yellow   Appearance Ur Clear Clear   Leukocytes,UA Negative Negative   Protein,UA Negative Negative/Trace   Glucose, UA Negative Negative   Ketones, UA Negative Negative   RBC, UA Negative Negative   Bilirubin, UA Negative Negative   Urobilinogen, Ur 0.2 0.2 - 1.0 mg/dL   Nitrite, UA Negative Negative      Assessment & Plan:   Problem List Items Addressed This Visit     Restless legs   Relevant Medications   cyclobenzaprine (FLEXERIL) 10 MG tablet   gabapentin (NEURONTIN) 600 MG tablet   PTSD (post-traumatic stress disorder)   Relevant Medications   LORazepam (ATIVAN) 1 MG tablet   Prediabetes   Hyperlipidemia   Relevant Medications   valsartan (DIOVAN) 160 MG tablet   hydrochlorothiazide (HYDRODIURIL) 12.5 MG tablet   GERD (gastroesophageal reflux disease)   Relevant Medications   omeprazole (PRILOSEC) 40 MG capsule   Essential hypertension - Primary   Relevant Medications   valsartan (DIOVAN)  160 MG tablet   hydrochlorothiazide (HYDRODIURIL) 12.5 MG tablet   Depression, recurrent (HCC)   Relevant Medications   LORazepam (ATIVAN) 1 MG tablet   Chiari malformation type I (HCC)   Relevant Medications   levETIRAcetam (KEPPRA) 500 MG tablet    Establish Care Previous PCP Dr Crissman / Dr Vigg  Refill meds  Changes to HTN therapy  Stop Benazepril 40mg daily Start Valsartan 160mg daily - better potency  Stop Amlodipine Start Hydrochlorothiazide (HCTZ) 12.5mg daily - help reduce swelling as well w/ LLE knee. Caution cramps, see below  In future both have room to adjust the dose up if needed. Also we could combine if it works. Or we can consider adding or replacing one with the Nebivolol (generic Bystolic) which is a relative of the Atenolol but it is   a better medicine.  To avoid muscle cramps  Increase hydration / make sure to get electrolytes added, want to keep potassium / magnesium up to avoid cramping. Can add supplements if needed.  GERD Inc dose PPI to Omeprazole 40mg daily, instead of OTC 20  Flexeril PRN for leg / RLS  History of Chiari Malformation On Keppra 500mg BID as prophylaxis   Meds ordered this encounter  Medications  . valsartan (DIOVAN) 160 MG tablet    Sig: Take 1 tablet (160 mg total) by mouth daily.    Dispense:  90 tablet    Refill:  1  . cyclobenzaprine (FLEXERIL) 10 MG tablet    Sig: take 1 tablet by mouth three times a day if needed for muscle spasm    Dispense:  90 tablet    Refill:  1  . gabapentin (NEURONTIN) 600 MG tablet    Sig: Take 1 tablet (600 mg total) by mouth at bedtime.    Dispense:  90 tablet    Refill:  3  . levETIRAcetam (KEPPRA) 500 MG tablet    Sig: Take 1 tablet (500 mg total) by mouth 2 (two) times daily.    Dispense:  180 tablet    Refill:  3  . LORazepam (ATIVAN) 1 MG tablet    Sig: Take 0.5-1 tablets (0.5-1 mg total) by mouth daily as needed for anxiety.    Dispense:  30 tablet    Refill:  2  . omeprazole  (PRILOSEC) 40 MG capsule    Sig: Take 1 capsule (40 mg total) by mouth daily before breakfast.    Dispense:  90 capsule    Refill:  3  . hydrochlorothiazide (HYDRODIURIL) 12.5 MG tablet    Sig: Take 1 tablet (12.5 mg total) by mouth daily.    Dispense:  90 tablet    Refill:  1     Follow up plan: Return in about 6 weeks (around 07/12/2022) for 6 week follow-up HTN, swelling.  optional chemistry / magnesium check at next visit, if doing well, can skip and do full blood panel later    , DO [facility] 05/31/2022, 10:16 AM 

## 2022-05-31 NOTE — Patient Instructions (Addendum)
Thank you for coming to the office today.  Stop Benazepril '40mg'$  daily Start Valsartan '160mg'$  daily  Stop Amlodipine Start Hydrochlorothiazide (HCTZ) 12.'5mg'$  daily  In future both have room to adjust the dose up if needed. Also we could combine if it works. Or we can consider adding or replacing one with the Nebivolol (generic Bystolic) which is a relative of the Atenolol but it is a better medicine.  To avoid muscle cramps  Increase hydration / make sure to get electrolytes added, want to keep potassium / magnesium up to avoid cramping. Can add supplements if needed.  - Try spoonful of yellow mustard to relieve leg cramps or try daily to prevent the problem  - OTC natural option is Hyland's Leg Cramps (Dissolving tablet) take as needed for muscle cramps    Please schedule a Follow-up Appointment to: Return in about 6 weeks (around 07/12/2022) for 6 week follow-up HTN, swelling.  If you have any other questions or concerns, please feel free to call the office or send a message through [employer]. You may also schedule an earlier appointment if necessary.  Additionally, you may be receiving a survey about your experience at our office within a few days to 1 week by e-mail or mail. We value your feedback.  Nobie Putnam, DO [place]

## 2022-06-09 DIAGNOSIS — M2392 Unspecified internal derangement of left knee: Secondary | ICD-10-CM | POA: Diagnosis not present

## 2022-06-29 ENCOUNTER — Ambulatory Visit: Payer: Medicare PPO | Admitting: Internal Medicine

## 2022-06-30 ENCOUNTER — Ambulatory Visit: Payer: Medicare PPO | Admitting: Physician Assistant

## 2022-07-12 ENCOUNTER — Encounter: Payer: Self-pay | Admitting: Family Medicine

## 2022-07-12 ENCOUNTER — Ambulatory Visit (INDEPENDENT_AMBULATORY_CARE_PROVIDER_SITE_OTHER): Payer: Medicare PPO | Admitting: Family Medicine

## 2022-07-12 VITALS — BP 150/78 | HR 73 | Ht 62.21 in | Wt 172.4 lb

## 2022-07-12 DIAGNOSIS — R252 Cramp and spasm: Secondary | ICD-10-CM

## 2022-07-12 DIAGNOSIS — I10 Essential (primary) hypertension: Secondary | ICD-10-CM

## 2022-07-12 NOTE — Patient Instructions (Addendum)
Thank you for coming to the office today.  Remain off Amlodipine Also keep off Hydrochlorothiazide since it caused the cramping. If we ever have to restart it, we could order some Potassium, but holding for now.  Double dose Valsartan '160mg'$  tab, now take 2 = '320mg'$  daily When running low, I can order you more - of the 320s, so take 1 daily.  Keep track of BP readings, goal < 140 / 90  Send an update on mychart 4 weeks, and we can re-evaluate.  May consider a different to add.  Labs today.  Please schedule a Follow-up Appointment to: Return in about 4 weeks (around 08/09/2022), or if symptoms worsen or fail to improve, for blood pressure, update.  If you have any other questions or concerns, please feel free to call the office or send a message through [place]. You may also schedule an earlier appointment if necessary.  Additionally, you may be receiving a survey about your experience at our office within a few days to 1 week by e-mail or mail. We value your feedback.  Nobie Putnam, DO [facility]

## 2022-07-12 NOTE — Progress Notes (Signed)
Subjective:    Patient ID: Amanda Wang, female    DOB: Aug 06, 1951, 71 y.o.   MRN: [id number]  Amanda Wang is a 71 y.o. female presenting on 07/12/2022 for Hypertension and Chronic Kidney Disease   HPI   Major Depression recurrent  Generalized Anxiety / Panic Attacks Taking Lorazepam 77m taking half or whole tab PRN anxiety panic, rarely takes, usually fills rx 1-2 times per year. Admits insomnia can affect her and she takes it PRN to help sleep as well. On Viibryd 458mdaily Followed by counselor therapist Upcoming visit with [facility] on 08/08/22 to discuss other options instead of Viibryd   Left Knee Pain, s/p meniscus repair arthroscopic Followed by Dr KrMack Guiset [facility], 11/2021 Osteoarthritis identified in knee Prior injection therapy cortisone in past. On Meloxicam 7.23m61mwice a day PRN. Improved with reduced swelling. Asks for handicap placard.   CHRONIC HTN: Last visit 05/31/22 updates since adjusted medication She was stopped on Amlodipine due to swelling. This has improved now off medication. She was switched from ACEi to ARB for better results. Started back on HCTZ low dose 12.5 but caused leg cramping. Has stopped this 3 days ago. She failed the 223m76mse years prior Due for repeat labs for chemistry. She continues Valsartan 160mg40mly.  BP related to stress. 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 Previously on Atenolol and it was successful for her but caused HR to go low to 40 at one point  Leg swelling now improved, less knee pain. Reports good compliance, took meds today. Tolerating well, w/o complaints. Denies CP, dyspnea, HA, edema, dizziness / lightheadedness   Fam history of Hemorrhagic CVA mother with complications.   Chiari Malformation Surgery Type 1 Issue with balance Prior surgery to repair w/ patch      07/12/2022    9:24 AM 05/31/2022   11:14 AM 04/25/2022    4:41 PM  Depression screen PHQ 2/9  Decreased Interest _0 Down, Depressed, Hopeless _1 PHQ - 2 Score _2 Altered sleeping _3 Tired, decreased energy _4 Change in appetite _5 Feeling bad or failure about yourself  0 1 1  Trouble concentrating 0 0 0  Moving slowly or fidgety/restless 0 0 0  Suicidal thoughts 0 0 0  PHQ-9 Score _6 Difficult doing work/chores Somewhat difficult Not difficult at all Not difficult at all    Social History   Tobacco Use   Smoking status: Former    Packs/day: 0.50    Years: 1.00    Total pack years: 0.50    Types: Cigarettes    Quit date: 07/26/1977    Years since quitting: 44.9   Smokeless tobacco: Never   Tobacco comments:    barley smoked when smoked   Vaping Use   Vaping Use: Never used  Substance Use Topics   Alcohol use: Yes    Alcohol/week: 0.0 standard drinks of alcohol    Comment: on occasion 1-2 x month   Drug use: No    Review of Systems Per HPI unless specifically indicated above     Objective:    BP (!) 150/78 (BP Location: Left Arm, Cuff Size: Normal)   Pulse 73   Ht 5' 2.21" (1.58 m)   Wt 172 lb 6.4 oz (78.2 kg)   SpO2 100%   BMI 31.32 kg/m   Wt Readings from Last  3 Encounters:  07/12/22 172 lb 6.4 oz (78.2 kg)  05/31/22 175 lb 3.2 oz (79.5 kg)  04/25/22 174 lb (78.9 kg)    Physical Exam Vitals and nursing note reviewed.  Constitutional:      General: She is not in acute distress.    Appearance: She is well-developed. She is not diaphoretic.     Comments: Well-appearing, comfortable, cooperative  HENT:     Head: Normocephalic and atraumatic.  Eyes:     General:        Right eye: No discharge.        Left eye: No discharge.     Conjunctiva/sclera: Conjunctivae normal.  Neck:     Thyroid: No thyromegaly.  Cardiovascular:     Rate and Rhythm: Normal rate and regular rhythm.     Heart sounds: Normal heart sounds. No murmur heard. Pulmonary:     Effort: Pulmonary effort is normal. No respiratory distress.     Breath sounds: Normal breath  sounds. No wheezing or rales.  Musculoskeletal:        General: Normal range of motion.     Cervical back: Normal range of motion and neck supple.     Right lower leg: No edema.     Left lower leg: No edema (resolved).  Lymphadenopathy:     Cervical: No cervical adenopathy.  Skin:    General: Skin is warm and dry.     Findings: No erythema or rash.  Neurological:     Mental Status: She is alert and oriented to person, place, and time.  Psychiatric:        Behavior: Behavior normal.     Comments: Well groomed, good eye contact, normal speech and thoughts      Results for orders placed or performed in visit on 04/25/22  Bayer DCA Hb A1c Waived  Result Value Ref Range   HB A1C (BAYER DCA - WAIVED) 5.8 (H) 4.8 - 5.6 %  Basic metabolic panel  Result Value Ref Range   Glucose 119 (H) 70 - 99 mg/dL   BUN 22 8 - 27 mg/dL   Creatinine, Ser 0.81 0.57 - 1.00 mg/dL   eGFR 79 >59 mL/min/1.73   BUN/Creatinine Ratio 27 12 - 28   Sodium 143 134 - 144 mmol/L   Potassium 4.3 3.5 - 5.2 mmol/L   Chloride 106 96 - 106 mmol/L   CO2 25 20 - 29 mmol/L   Calcium 9.4 8.7 - 10.3 mg/dL  Vitamin B12  Result Value Ref Range   Vitamin B-12 515 232 - 1,245 pg/mL  Urinalysis, Routine w reflex microscopic  Result Value Ref Range   Specific Gravity, UA >1.030 (H) 1.005 - 1.030   pH, UA 5.5 5.0 - 7.5   Color, UA Yellow Yellow   Appearance Ur Clear Clear   Leukocytes,UA Negative Negative   Protein,UA Negative Negative/Trace   Glucose, UA Negative Negative   Ketones, UA Negative Negative   RBC, UA Negative Negative   Bilirubin, UA Negative Negative   Urobilinogen, Ur 0.2 0.2 - 1.0 mg/dL   Nitrite, UA Negative Negative      Assessment & Plan:   Problem List Items Addressed This Visit     Essential hypertension - Primary    Improved but still elevated HTN - Home BP readings needed. None today available  No known complications  Failed Amlodipine due to swelling legs, HCTZ 12.5 and 25 with  cramps   Plan:  1. Dose increase Valsartan 160 up to  339m daily, use current pills double dose, notify if when ready for the 3254monce daily rx  - Consider adding BB Carvedilol as next line option for HTN management if indicated still  2. Encourage improved lifestyle - low sodium diet, regular exercise 3. Start monitor BP outside office, bring readings to next visit, if persistently >140/90 or new symptoms notify office sooner      Relevant Medications   valsartan (DIOVAN) 160 MG tablet   Other Relevant Orders   COMPLETE METABOLIC PANEL WITH GFR   CBC with Differential/Platelet   Magnesium   Other Visit Diagnoses     Leg cramps           Labs today for chemistry magnesium  Orders Placed This Encounter  Procedures   COMPLETE METABOLIC PANEL WITH GFR   CBC with Differential/Platelet   Magnesium     No orders of the defined types were placed in this encounter.     Follow up plan: Return in about 4 weeks (around 08/09/2022), or if symptoms worsen or fail to improve, for blood pressure, update.   AlNobie PutnamDOSilver Springs Shoresedical Group 07/12/2022, 8:59 AM

## 2022-07-12 NOTE — Assessment & Plan Note (Signed)
Improved but still elevated HTN - Home BP readings needed. None today available  No known complications  Failed Amlodipine due to swelling legs, HCTZ 12.5 and 25 with cramps   Plan:  1. Dose increase Valsartan 160 up to '320mg'$  daily, use current pills double dose, notify if when ready for the '320mg'$  once daily rx  - Consider adding BB Carvedilol as next line option for HTN management if indicated still  2. Encourage improved lifestyle - low sodium diet, regular exercise 3. Start monitor BP outside office, bring readings to next visit, if persistently >140/90 or new symptoms notify office sooner

## 2022-07-13 ENCOUNTER — Encounter: Payer: Self-pay | Admitting: Family Medicine

## 2022-07-13 DIAGNOSIS — R7309 Other abnormal glucose: Secondary | ICD-10-CM | POA: Insufficient documentation

## 2022-07-13 LAB — COMPLETE METABOLIC PANEL WITH GFR
AG Ratio: 1.4 (calc) (ref 1.0–2.5)
ALT: 10 U/L (ref 6–29)
AST: 13 U/L (ref 10–35)
Albumin: 3.9 g/dL (ref 3.6–5.1)
Alkaline phosphatase (APISO): 113 U/L (ref 37–153)
BUN: 18 mg/dL (ref 7–25)
CO2: 32 mmol/L (ref 20–32)
Calcium: 8.8 mg/dL (ref 8.6–10.4)
Chloride: 104 mmol/L (ref 98–110)
Creat: 0.85 mg/dL (ref 0.50–1.05)
Globulin: 2.8 g/dL (calc) (ref 1.9–3.7)
Glucose, Bld: 115 mg/dL — ABNORMAL HIGH (ref 65–99)
Potassium: 4.3 mmol/L (ref 3.5–5.3)
Sodium: 142 mmol/L (ref 135–146)
Total Bilirubin: 0.4 mg/dL (ref 0.2–1.2)
Total Protein: 6.7 g/dL (ref 6.1–8.1)
eGFR: 74 mL/min/{1.73_m2} (ref >=60)

## 2022-07-13 LAB — CBC WITH DIFFERENTIAL/PLATELET
Absolute Monocytes: 462 cells/uL (ref 200–950)
Basophils Absolute: 20 cells/uL (ref 0–200)
Basophils Relative: 0.3 %
Eosinophils Absolute: 178 cells/uL (ref 15–500)
Eosinophils Relative: 2.7 %
HCT: 32.1 % — ABNORMAL LOW (ref 35.0–45.0)
Hemoglobin: 9.9 g/dL — ABNORMAL LOW (ref 11.7–15.5)
Lymphs Abs: 1333 cells/uL (ref 850–3900)
MCH: 24.6 pg — ABNORMAL LOW (ref 27.0–33.0)
MCHC: 30.8 g/dL — ABNORMAL LOW (ref 32.0–36.0)
MCV: 79.7 fL — ABNORMAL LOW (ref 80.0–100.0)
MPV: 9.5 fL (ref 7.5–12.5)
Monocytes Relative: 7 %
Neutro Abs: 4607 cells/uL (ref 1500–7800)
Neutrophils Relative %: 69.8 %
Platelets: 330 10*3/uL (ref 140–400)
RBC: 4.03 10*6/uL (ref 3.80–5.10)
RDW: 16.1 % — ABNORMAL HIGH (ref 11.0–15.0)
Total Lymphocyte: 20.2 %
WBC: 6.6 10*3/uL (ref 3.8–10.8)

## 2022-07-13 LAB — MAGNESIUM: Magnesium: 2.2 mg/dL (ref 1.5–2.5)

## 2022-08-05 NOTE — Progress Notes (Deleted)
Psychiatric Initial Adult Assessment   Patient Identification: ARIAM MOL MRN:  [id number] Date of Evaluation:  08/05/2022 Referral Source: *** Chief Complaint:  No chief complaint on file.  Visit Diagnosis: No diagnosis found.  History of Present Illness:   LYNNEA VANDERVOORT is a 71 y.o. year old female with a history of PTSD, depression, restless leg, hypertension, gastric sleeve in 2013, chiari malformation type I , who is referred for          Associated Signs/Symptoms: Depression Symptoms:  {DEPRESSION SYMPTOMS:20000} (Hypo) Manic Symptoms:  {BHH MANIC SYMPTOMS:22872} Anxiety Symptoms:  {BHH ANXIETY SYMPTOMS:22873} Psychotic Symptoms:  {BHH PSYCHOTIC SYMPTOMS:22874} PTSD Symptoms: {BHH PTSD SYMPTOMS:22875}  Past Psychiatric History:  Outpatient:  Psychiatry admission:  Previous suicide attempt:  Past trials of medication:  History of violence:    Previous Psychotropic Medications: {YES/NO:21197}  Substance Abuse History in the last 12 months:  {yes no:314532}  Consequences of Substance Abuse: {BHH CONSEQUENCES OF SUBSTANCE ABUSE:22880}  Past Medical History:  Past Medical History:  Diagnosis Date   Advanced care planning/counseling discussion 09/16/2018   Arthritis    Chiari malformation type I ([place])    Depression    GERD (gastroesophageal reflux disease)    History of hiatal hernia    Hyperlipidemia    Hypertension    PONV (postoperative nausea and vomiting)    Sleep apnea    Small intestinal bacterial overgrowth    Spinal stenosis    Vaccine reaction, initial encounter 03/11/2020    Past Surgical History:  Procedure Laterality Date   BREAST BIOPSY Right    neg   chiari malformation I decompression surgery  11-06   COLONOSCOPY WITH PROPOFOL N/A 11/28/2018   Procedure: COLONOSCOPY WITH PROPOFOL;  Surgeon: Jonathon Bellows, MD;  Location: [facility] ENDOSCOPY;  Service: Gastroenterology;  Laterality: N/A;   COLONOSCOPY WITH PROPOFOL N/A 10/27/2021    Procedure: COLONOSCOPY WITH PROPOFOL;  Surgeon: Jonathon Bellows, MD;  Location: [facility] ENDOSCOPY;  Service: Gastroenterology;  Laterality: N/A;   deviated septum repair     ESOPHAGOGASTRODUODENOSCOPY N/A 10/27/2021   Procedure: ESOPHAGOGASTRODUODENOSCOPY (EGD);  Surgeon: Jonathon Bellows, MD;  Location: [facility] ENDOSCOPY;  Service: Gastroenterology;  Laterality: N/A;   HERNIA REPAIR     KNEE ARTHROSCOPY WITH MEDIAL MENISECTOMY Left 01/12/2022   Procedure: KNEE ARTHROSCOPY WITH MEDIAL MENISECTOMY;  Surgeon: Thornton Park, MD;  Location: [facility] ORS;  Service: Orthopedics;  Laterality: Left;    Family Psychiatric History: ***  Family History:  Family History  Problem Relation Age of Onset   Hypertension Mother    Cancer Mother    Stroke Mother    Heart disease Father    Breast cancer Sister 94   Breast cancer Paternal Aunt    Diabetes Paternal Grandmother     Social History:   Social History   Socioeconomic History   Marital status: Divorced    Spouse name: Not on file   Number of children: Not on file   Years of education: Not on file   Highest education level: Bachelor's degree (e.g., BA, AB, BS)  Occupational History   Occupation: part time   Tobacco Use   Smoking status: Former    Packs/day: 0.50    Years: 1.00    Total pack years: 0.50    Types: Cigarettes    Quit date: 07/26/1977    Years since quitting: 45.0   Smokeless tobacco: Never   Tobacco comments:    barley smoked when smoked   Vaping Use   Vaping Use: Never used  Substance and Sexual Activity   Alcohol use: Yes    Alcohol/week: 0.0 standard drinks of alcohol    Comment: on occasion 1-2 x month   Drug use: No   Sexual activity: Yes  Other Topics Concern   Not on file  Social History Narrative   Lives alone.   Social Determinants of Health   Financial Resource Strain: Low Risk  (10/17/2021)   Overall Financial Resource Strain (CARDIA)    Difficulty of Paying Living Expenses: Not hard at all  Food Insecurity:  No Food Insecurity (10/17/2021)   Hunger Vital Sign    Worried About Running Out of Food in the Last Year: Never true    Ran Out of Food in the Last Year: Never true  Transportation Needs: No Transportation Needs (10/17/2021)   PRAPARE - Hydrologist (Medical): No    Lack of Transportation (Non-Medical): No  Physical Activity: Inactive (10/17/2021)   Exercise Vital Sign    Days of Exercise per Week: 0 days    Minutes of Exercise per Session: 0 min  Stress: Stress Concern Present (10/17/2021)   [place]    Feeling of Stress : To some extent  Social Connections: Moderately Integrated (09/22/2019)   Social Connection and Isolation Panel [NHANES]    Frequency of Communication with Friends and Family: More than three times a week    Frequency of Social Gatherings with Friends and Family: More than three times a week    Attends Religious Services: More than 4 times per year    Active Member of [employer] or Organizations: Yes    Attends Music therapist: More than 4 times per year    Marital Status: Divorced    Additional Social History: ***  Allergies:   Allergies  Allergen Reactions   Fexofenadine Anxiety    Makes her have a panic attack   Codeine Nausea Only    Dizziness   Methylphenidate Hcl     "Caused a seizure"   Zoloft [Sertraline] Other (See Comments)    Sleepiness   Magnevist [Gadopentetate] Hives and Itching    IV Contrast   Sulfa Antibiotics Rash    Metabolic Disorder Labs: Lab Results  Component Value Date   HGBA1C 5.8 (H) 04/25/2022   No results found for: "PROLACTIN" Lab Results  Component Value Date   CHOL 158 05/31/2021   TRIG 127 05/31/2021   HDL 62 05/31/2021   CHOLHDL 2.5 05/31/2021   VLDL 33 (H) 03/07/2018   LDLCALC 74 05/31/2021   LDLCALC 80 01/11/2021   Lab Results  Component Value Date   TSH 1.240 12/30/2021    Therapeutic Level  Labs: No results found for: "LITHIUM" No results found for: "CBMZ" No results found for: "VALPROATE"  Current Medications: Current Outpatient Medications  Medication Sig Dispense Refill   Accu-Chek Softclix Lancets lancets TEST TWO TO THREE TIMES DAILY 100 each 2   acyclovir (ZOVIRAX) 200 MG capsule Take 200 mg by mouth 5 (five) times daily as needed (fever blisters).     atorvastatin (LIPITOR) 40 MG tablet Take 1 tablet (40 mg total) by mouth daily at 6 PM. Need appt with PCP prior to refill 90 tablet 2   Carboxymethylcellul-Glycerin (LUBRICATING EYE DROPS OP) Place 1 drop into both eyes daily as needed (dry eyes).     Charcoal Activated (ACTIVATED CHARCOAL PO) Take 2 capsules by mouth as needed (gas).     cyclobenzaprine (FLEXERIL) 10  MG tablet take 1 tablet by mouth three times a day if needed for muscle spasm 90 tablet 1   gabapentin (NEURONTIN) 600 MG tablet Take 1 tablet (600 mg total) by mouth at bedtime. 90 tablet 3   levETIRAcetam (KEPPRA) 500 MG tablet Take 1 tablet (500 mg total) by mouth 2 (two) times daily. 180 tablet 3   Lidocaine-Menthol (ICY HOT MAX LIDOCAINE EX) Apply 1 application. topically daily as needed (pain).     LORazepam (ATIVAN) 1 MG tablet Take 0.5-1 tablets (0.5-1 mg total) by mouth daily as needed for anxiety. 30 tablet 2   meloxicam (MOBIC) 7.5 MG tablet meloxicam 7.5 mg tablet  TAKE 1 TABLET BY MOUTH TWICE DAILY     omeprazole (PRILOSEC) 40 MG capsule Take 1 capsule (40 mg total) by mouth daily before breakfast. 90 capsule 3   valsartan (DIOVAN) 160 MG tablet Take 2 tablets (320 mg total) by mouth daily. Note she still has '160mg'$  tablets taking 2, will need to re order for single '320mg'$  when ready. 90 tablet 0   Vilazodone HCl (VIIBRYD) 40 MG TABS Take 1 tablet (40 mg total) by mouth daily. 90 tablet 0   No current facility-administered medications for this visit.    Musculoskeletal: Strength & Muscle Tone: within normal limits Gait & Station:  normal Patient leans: N/A  Psychiatric Specialty Exam: Review of Systems  There were no vitals taken for this visit.There is no height or weight on file to calculate BMI.  General Appearance: {Appearance:22683}  Eye Contact:  {BHH EYE CONTACT:22684}  Speech:  Clear and Coherent  Volume:  Normal  Mood:  {BHH MOOD:22306}  Affect:  {Affect (PAA):22687}  Thought Process:  Coherent  Orientation:  Full (Time, Place, and Person)  Thought Content:  Logical  Suicidal Thoughts:  {ST/HT (PAA):22692}  Homicidal Thoughts:  {ST/HT (PAA):22692}  Memory:  Immediate;   Good  Judgement:  {Judgement (PAA):22694}  Insight:  {Insight (PAA):22695}  Psychomotor Activity:  Normal  Concentration:  Concentration: Good and Attention Span: Good  Recall:  Good  Fund of Knowledge:Good  Language: Good  Akathisia:  No  Handed:  Right  AIMS (if indicated):  not done  Assets:  Communication Skills Desire for Improvement  ADL's:  Intact  Cognition: WNL  Sleep:  {BHH GOOD/FAIR/POOR:22877}   Screenings: GAD-7    Flowsheet Row Office Visit from 07/12/2022 in [facility] Office Visit from 05/31/2022 in [facility] Office Visit from 04/25/2022 in [place] Visit from 03/27/2022 in [place] Visit from 12/30/2021 in [place]  Total GAD-7 Score '6 7 7 5 '$ 0      PHQ2-9    [place] Visit from 07/12/20[facility] in [facility] Office Visit from 05/31/2022 in [facility] Office Visit from 04/25/2022 in [place] Visit from 03/27/2022 in [place] Visit from 12/30/2021 in [place]  PHQ-2 Total Score '2 2 2 2 '$ 0  PHQ-9 Total Score '8 10 10 6 1      '$ Flowsheet Row Admission (Discharged) from 01/12/2022 in [place] [facility] from 01/03/2022 in [place] Admission (Discharged) from 10/27/2021 in [place] No Risk No Risk No Risk       Assessment and Plan:  Assessment  Plan   The patient demonstrates the following risk factors for suicide:  Chronic risk factors for suicide include: {Chronic Risk Factors for XYVOPFY:92446286}. Acute risk factors for suicide include: {Acute Risk Factors for NOTRRNH:65790383}. Protective factors for this patient include: {Protective Factors for Suicide FXOV:29191660}. Considering these factors, the overall suicide risk at this point appears to be {Desc; low/moderate/high:110033}. Patient {ACTION; IS/IS AYO:45997741} appropriate for outpatient follow up.      Collaboration of Care: {BH OP Collaboration of Care:21014065}  Patient/Guardian was advised Release of Information must be obtained prior to any record release in order to collaborate their care with an outside provider. Patient/Guardian was advised if they have not already done so to contact the registration department to sign all necessary forms in order for [place] to release information regarding their care.   Consent: Patient/Guardian gives verbal consent for treatment and assignment of benefits for services provided during this visit. Patient/Guardian expressed understanding and agreed to proceed.   Norman Clay, MD 8/19/20236:34 AM

## 2022-08-08 ENCOUNTER — Ambulatory Visit: Payer: Self-pay | Admitting: Psychiatry

## 2022-09-06 ENCOUNTER — Ambulatory Visit: Payer: Medicare PPO | Admitting: Family Medicine

## 2022-10-04 DIAGNOSIS — M2392 Unspecified internal derangement of left knee: Secondary | ICD-10-CM | POA: Diagnosis not present

## 2022-10-04 DIAGNOSIS — M1712 Unilateral primary osteoarthritis, left knee: Secondary | ICD-10-CM | POA: Diagnosis not present
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
## 2022-10-09 ENCOUNTER — Ambulatory Visit (INDEPENDENT_AMBULATORY_CARE_PROVIDER_SITE_OTHER): Payer: Medicare PPO | Admitting: Psychiatry

## 2022-10-09 ENCOUNTER — Encounter: Payer: Self-pay | Admitting: Psychiatry

## 2022-10-09 VITALS — BP 169/82 | HR 97 | Temp 98.2°F | Ht 62.21 in | Wt 178.6 lb

## 2022-10-09 DIAGNOSIS — F411 Generalized anxiety disorder: Secondary | ICD-10-CM

## 2022-10-09 DIAGNOSIS — F4310 Post-traumatic stress disorder, unspecified: Secondary | ICD-10-CM

## 2022-10-09 DIAGNOSIS — F410 Panic disorder [episodic paroxysmal anxiety] without agoraphobia: Secondary | ICD-10-CM

## 2022-10-09 MED ORDER — BUSPIRONE HCL 5 MG PO TABS: 5.0000 mg | ORAL_TABLET | Freq: Two times a day (BID) | ORAL | 1 refills | Status: DC

## 2022-10-09 MED ORDER — VILAZODONE HCL 40 MG PO TABS: 40.0000 mg | ORAL_TABLET | Freq: Every day | ORAL | 0 refills | Status: DC

## 2022-10-11 DIAGNOSIS — M25562 Pain in left knee: Secondary | ICD-10-CM | POA: Diagnosis not present

## 2022-10-18 ENCOUNTER — Ambulatory Visit: Payer: Medicare PPO

## 2022-10-31 DIAGNOSIS — M25562 Pain in left knee: Secondary | ICD-10-CM | POA: Diagnosis not present

## 2022-11-07 DIAGNOSIS — M25562 Pain in left knee: Secondary | ICD-10-CM | POA: Diagnosis not present

## 2022-11-14 DIAGNOSIS — M25562 Pain in left knee: Secondary | ICD-10-CM | POA: Diagnosis not present

## 2022-11-22 ENCOUNTER — Telehealth: Payer: Self-pay | Admitting: Family Medicine

## 2022-11-22 DIAGNOSIS — M1712 Unilateral primary osteoarthritis, left knee: Secondary | ICD-10-CM | POA: Diagnosis not present

## 2022-11-22 DIAGNOSIS — I10 Essential (primary) hypertension: Secondary | ICD-10-CM

## 2022-11-22 NOTE — Telephone Encounter (Signed)
DC' d 07/12/22 Dr. Parks Ranger (dose changed)  Requested Prescriptions  Refused Prescriptions Disp Refills   valsartan (DIOVAN) 160 MG tablet [Pharmacy Med Name: VALSARTAN '160MG'$  TABLETS] 90 tablet 0    Sig: TAKE 1 TABLET(160 MG) BY MOUTH DAILY     Cardiovascular:  Angiotensin Receptor Blockers Failed - 11/22/2022  3:11 AM      Failed - Last BP in normal range    BP Readings from Last 1 Encounters:  07/12/22 (!) 150/78         Passed - Cr in normal range and within 180 days    Creatinine  Date Value Ref Range Status  01/11/2021 54.2 20.0 - 300.0 mg/dL Final   Creat  Date Value Ref Range Status  07/12/2022 0.85 0.50 - 1.05 mg/dL Final         Passed - K in normal range and within 180 days    Potassium  Date Value Ref Range Status  07/12/2022 4.3 3.5 - 5.3 mmol/L Final  07/24/2012 4.2 3.5 - 5.1 mmol/L Final         Passed - Patient is not pregnant      Passed - Valid encounter within last 6 months    Recent Outpatient Visits           4 months ago Essential hypertension   [facility] Olin Hauser, DO   5 months ago Essential hypertension   [place], Devonne Doughty, DO   7 months ago Hypertension associated with diabetes [facility])   [facility] Vigg, Avanti, MD   8 months ago Essential hypertension   [facility] Mecum, Erin E, PA-C   10 months ago Need for influenza vaccination   [facility] Charlynne Cousins, MD

## 2022-12-01 NOTE — Telephone Encounter (Signed)
Medication Refill - Medication: valsartan (DIOVAN)   Has the patient contacted their pharmacy? Yes.   Pharmacy sent refill request for '160mg'$  tablets. *Provider notes state that on next refill he will change it to '320mg'$  tablets.   Preferred Pharmacy (with phone number or street name):  [facility] [place], [address] Phone: [phone]  Fax: [phone]     Has the patient been seen for an appointment in the last year OR does the patient have an upcoming appointment? Yes.    Agent: Please be advised that RX refills may take up to 3 business days. We ask that you follow-up with your pharmacy.
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
## 2022-12-04 MED ORDER — VALSARTAN 320 MG PO TABS: 320.0000 mg | ORAL_TABLET | Freq: Every day | ORAL | 3 refills | Status: DC

## 2022-12-05 ENCOUNTER — Ambulatory Visit: Payer: Medicare PPO | Admitting: Psychiatry

## 2022-12-05 ENCOUNTER — Other Ambulatory Visit: Payer: Self-pay | Admitting: Psychiatry

## 2022-12-05 MED ORDER — VILAZODONE HCL 40 MG PO TABS: 40.0000 mg | ORAL_TABLET | Freq: Every day | ORAL | 0 refills | Status: DC

## 2022-12-05 MED ORDER — BUSPIRONE HCL 5 MG PO TABS: 5.0000 mg | ORAL_TABLET | Freq: Two times a day (BID) | ORAL | 1 refills | Status: DC

## 2022-12-12 ENCOUNTER — Ambulatory Visit: Payer: Self-pay | Admitting: *Deleted

## 2022-12-12 DIAGNOSIS — J101 Influenza due to other identified influenza virus with other respiratory manifestations: Secondary | ICD-10-CM | POA: Diagnosis not present

## 2022-12-12 DIAGNOSIS — R509 Fever, unspecified: Secondary | ICD-10-CM | POA: Diagnosis not present

## 2022-12-12 DIAGNOSIS — R059 Cough, unspecified: Secondary | ICD-10-CM | POA: Diagnosis not present

## 2022-12-12 DIAGNOSIS — Z03818 Encounter for observation for suspected exposure to other biological agents ruled out: Secondary | ICD-10-CM | POA: Diagnosis not present

## 2022-12-12 DIAGNOSIS — R051 Acute cough: Secondary | ICD-10-CM | POA: Diagnosis not present

## 2022-12-12 DIAGNOSIS — J209 Acute bronchitis, unspecified: Secondary | ICD-10-CM | POA: Diagnosis not present

## 2022-12-12 NOTE — Telephone Encounter (Signed)
  Chief Complaint: Cough, Chest heaviness Symptoms: Dry cough, congestion, LGT last week, not presently. Reports chest "Heaviness" SOB at rest, wheezing at times. Covid neg Frequency: 6 days ago Pertinent Negatives: Patient denies fever presently Disposition: '[]'$ ED /'[x]'$ Urgent Care (no appt availability in office) / '[]'$ Appointment(In office/virtual)/ '[]'$  [place] [facility]/ '[]'$ Home Care/ '[]'$ Refused Recommended Disposition /'[]'$ [facility] [facility]/ '[]'$  Follow-up with PCP Additional Notes: Advised UC, states will follow disposition. Care advise provided, pt verbalizes understanding. Reason for Disposition  Wheezing is present  Answer Assessment - Initial Assessment Questions 1. ONSET: "When did the cough begin?"      6 days ago 2. SEVERITY: "How bad is the cough today?"      Bad spells during day, awake at HS 3. SPUTUM: "Describe the color of your sputum" (none, dry cough; clear, white, yellow, green)     Dry 4. HEMOPTYSIS: "Are you coughing up any blood?" If so ask: "How much?" (flecks, streaks, tablespoons, etc.)     Dry 5. DIFFICULTY BREATHING: "Are you having difficulty breathing?" If Yes, ask: "How bad is it?" (e.g., mild, moderate, severe)    - MILD: No SOB at rest, mild SOB with walking, speaks normally in sentences, can lie down, no retractions, pulse < 100.    - MODERATE: SOB at rest, SOB with minimal exertion and prefers to sit, cannot lie down flat, speaks in phrases, mild retractions, audible wheezing, pulse 100-120.    - SEVERE: Very SOB at rest, speaks in single words, struggling to breathe, sitting hunched forward, retractions, pulse > 120      SOB at rest, chest heaviness 6. FEVER: "Do you have a fever?" If Yes, ask: "What is your temperature, how was it measured, and when did it start?"     LGT last week 7. CARDIAC HISTORY: "Do you have any history of heart disease?" (e.g., heart attack, congestive heart failure)       8. LUNG HISTORY: "Do you have any history of lung  disease?"  (e.g., pulmonary embolus, asthma, emphysema)      9. PE RISK FACTORS: "Do you have a history of blood clots?" (or: recent major surgery, recent prolonged travel, bedridden)      10. OTHER SYMPTOMS: "Do you have any other symptoms?" (e.g., runny nose, wheezing, chest pain)       Wheezing, chest tightness, rattling.  Protocols used: Cough - Acute Non-Productive-A-AH

## 2022-12-13 DIAGNOSIS — J101 Influenza due to other identified influenza virus with other respiratory manifestations: Secondary | ICD-10-CM | POA: Diagnosis not present

## 2022-12-13 DIAGNOSIS — F410 Panic disorder [episodic paroxysmal anxiety] without agoraphobia: Secondary | ICD-10-CM | POA: Diagnosis not present

## 2023-01-17 DIAGNOSIS — M1712 Unilateral primary osteoarthritis, left knee: Secondary | ICD-10-CM | POA: Diagnosis not present

## 2023-01-22 DIAGNOSIS — M1712 Unilateral primary osteoarthritis, left knee: Secondary | ICD-10-CM | POA: Diagnosis not present
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
## 2023-01-29 ENCOUNTER — Ambulatory Visit (INDEPENDENT_AMBULATORY_CARE_PROVIDER_SITE_OTHER): Payer: Medicare PPO | Admitting: Psychiatry

## 2023-01-29 ENCOUNTER — Encounter: Payer: Self-pay | Admitting: Psychiatry

## 2023-01-29 DIAGNOSIS — F330 Major depressive disorder, recurrent, mild: Secondary | ICD-10-CM | POA: Diagnosis not present

## 2023-01-29 DIAGNOSIS — F410 Panic disorder [episodic paroxysmal anxiety] without agoraphobia: Secondary | ICD-10-CM

## 2023-01-29 DIAGNOSIS — F411 Generalized anxiety disorder: Secondary | ICD-10-CM

## 2023-01-29 DIAGNOSIS — F4310 Post-traumatic stress disorder, unspecified: Secondary | ICD-10-CM | POA: Diagnosis not present

## 2023-01-29 MED ORDER — BUSPIRONE HCL 5 MG PO TABS: 5.0000 mg | ORAL_TABLET | Freq: Two times a day (BID) | ORAL | 0 refills | Status: DC

## 2023-01-29 NOTE — Patient Instructions (Signed)
Decrease Viibryd 20 mg daily  Continue Buspar 5 mg twice a day  Referral to therapy  Next appointment: 4/11 at 10:30

## 2023-02-07 ENCOUNTER — Encounter: Payer: Self-pay | Admitting: Family Medicine

## 2023-02-07 ENCOUNTER — Ambulatory Visit: Payer: Medicare PPO | Admitting: Family Medicine

## 2023-02-07 VITALS — BP 150/84 | HR 77 | Ht 62.21 in | Wt 183.2 lb

## 2023-02-07 DIAGNOSIS — F339 Major depressive disorder, recurrent, unspecified: Secondary | ICD-10-CM

## 2023-02-07 DIAGNOSIS — G8929 Other chronic pain: Secondary | ICD-10-CM

## 2023-02-07 DIAGNOSIS — E782 Mixed hyperlipidemia: Secondary | ICD-10-CM

## 2023-02-07 DIAGNOSIS — G935 Compression of brain: Secondary | ICD-10-CM | POA: Diagnosis not present

## 2023-02-07 DIAGNOSIS — E559 Vitamin D deficiency, unspecified: Secondary | ICD-10-CM | POA: Diagnosis not present

## 2023-02-07 DIAGNOSIS — K219 Gastro-esophageal reflux disease without esophagitis: Secondary | ICD-10-CM | POA: Diagnosis not present

## 2023-02-07 DIAGNOSIS — I10 Essential (primary) hypertension: Secondary | ICD-10-CM | POA: Diagnosis not present

## 2023-02-07 DIAGNOSIS — G2581 Restless legs syndrome: Secondary | ICD-10-CM | POA: Diagnosis not present

## 2023-02-07 DIAGNOSIS — F419 Anxiety disorder, unspecified: Secondary | ICD-10-CM

## 2023-02-07 DIAGNOSIS — R7303 Prediabetes: Secondary | ICD-10-CM | POA: Diagnosis not present

## 2023-02-07 DIAGNOSIS — M1712 Unilateral primary osteoarthritis, left knee: Secondary | ICD-10-CM | POA: Diagnosis not present

## 2023-02-07 DIAGNOSIS — E538 Deficiency of other specified B group vitamins: Secondary | ICD-10-CM

## 2023-02-07 DIAGNOSIS — M25562 Pain in left knee: Secondary | ICD-10-CM | POA: Diagnosis not present

## 2023-02-07 MED ORDER — CYCLOBENZAPRINE HCL 10 MG PO TABS: ORAL_TABLET | ORAL | 3 refills | Status: DC

## 2023-02-07 MED ORDER — GABAPENTIN 600 MG PO TABS: 600.0000 mg | ORAL_TABLET | Freq: Every day | ORAL | 3 refills | Status: DC

## 2023-02-07 MED ORDER — ATORVASTATIN CALCIUM 40 MG PO TABS: 40.0000 mg | ORAL_TABLET | Freq: Every day | ORAL | 3 refills | Status: DC

## 2023-02-07 MED ORDER — LORAZEPAM 1 MG PO TABS: 0.5000 mg | ORAL_TABLET | Freq: Every day | ORAL | 2 refills | Status: DC | PRN

## 2023-02-07 MED ORDER — CARVEDILOL 6.25 MG PO TABS: 6.2500 mg | ORAL_TABLET | Freq: Two times a day (BID) | ORAL | 3 refills | Status: DC

## 2023-02-07 MED ORDER — OMEPRAZOLE 40 MG PO CPDR: 40.0000 mg | DELAYED_RELEASE_CAPSULE | Freq: Every day | ORAL | 3 refills | Status: DC

## 2023-02-07 MED ORDER — LEVETIRACETAM 500 MG PO TABS: 500.0000 mg | ORAL_TABLET | Freq: Two times a day (BID) | ORAL | 3 refills | Status: DC

## 2023-02-07 NOTE — Patient Instructions (Addendum)
Thank you for coming to the office today.  Prevnar-20 vaccine at the pharmacy when you are ready.  Re ordered all of the medications, with plenty of refills  Including Lorazepam +2 refill, let me know if run out sooner, we can arrange additional refills.  Plan for future knee surgery in Summer. Will stay tuned on updates.  Concern for the elevation of Blood Pressure, most likely tied to stress both physical pain and mental.  Add Carvedilol 6.7m twice a day, this can help reduce heart rate and help lower blood pressure  Keep on Valsartan 3214mdaily.  Future if still high BP >140/90, we can consider contacting a Cardiology / BP specialist.  DUE for FASTING BLOOD WORK (no food or drink after midnight before the lab appointment, only water or coffee without cream/sugar on the morning of)  SCHEDULE "Lab Only" visit in the morning at the clinic for lab draw in 1 WEEK  - Make sure Lab Only appointment is at about 1 week before your next appointment, so that results will be available  For Lab Results, once available within 2-3 days of blood draw, you can can log in to MyChart online to view your results and a brief explanation. Also, we can discuss results at next follow-up visit.    Please schedule a Follow-up Appointment to: Return in about 1 week (around 02/14/2023) for 1 week for fasting lab only, then can return in 6 months for follow-up.  If you have any other questions or concerns, please feel free to call the office or send a message through [facility] may also schedule an earlier appointment if necessary.  Additionally, you may be receiving a survey about your experience at our office within a few days to 1 week by e-mail or mail. We value your feedback.  AlNobie PutnamDO [place]

## 2023-02-07 NOTE — Progress Notes (Signed)
Subjective:    Patient ID: Amanda Wang, female    DOB: Sep 05, 1951, 72 y.o.   MRN: [id number]  Amanda Wang is a 72 y.o. female presenting on 02/07/2023 for Hypertension, Gastroesophageal Reflux, and Hyperlipidemia   HPI   GERD omeprazole 30m daily  Major Depression recurrent  Generalized Anxiety / Panic Attacks Taking Lorazepam 112mtaking half or whole tab PRN anxiety panic, rarely takes, usually fills rx 1-2 times per year. Admits insomnia can affect her and she takes it PRN to help sleep as well. [facility] Last visit 01/31/23 visit, she has decreased Viibryd to 2023maily, continued Buspar 5mg24mice a day, they were connecting her with therapist. Considered switching back to Fluoxetine but she prefers to avoid this.  Mental health has impacted her and has issues with subbing at school, causing stressors for her. Today due for re order Lorazepam and other med. Using AS NEEDED, not regularly.   Left Knee Pain, s/p meniscus repair arthroscopic [facility], 11/2021 Osteoarthritis identified in knee Prior injection therapy cortisone in past. Previously on Meloxicam, now on Celebrex. Per Ortho Improved with reduced swelling. On Gabapentin 600 daily  Specialized custom knee brace L, after meniscus surgery, has bone on bone, future knee surgery - She has tried gel injections - Future knee replacement surgery June 2024   CHRONIC HTN: Off prior meds Amlodipine caused swelling and HCTZ muscle cramps ACEi > ARB, prior Valsartan 160 to 320mg46mrently valsartan 320mg 51my Home readings SBP 150+ Seems high since 12/2021 with SBP 150+, prior to that was having 120/80s normal range Thought maybe since her Knee injury has had this issue with high blood pressure  Leg swelling now improved, less knee pain. Reports good compliance, took meds today. Tolerating well, w/o complaints. Denies CP, dyspnea, HA, edema, dizziness /  lightheadedness   Fam history of Hemorrhagic CVA mother with complications.   Chiari Malformation Surgery Type 1 Issue with balance Prior surgery to repair w/ patch  On Keppra 500 BID   Health Maintenance: Future [id number] ready. Can get at pharmacy.     02/07/2023    5:08 PM 01/29/2023    5:05 PM 10/09/2022    9:15 AM  Depression screen PHQ 2/9  Decreased Interest 1  0  Down, Depressed, Hopeless 1 1 0  PHQ - 2 Score 2 1 0  Altered sleeping 2 2 2  $ Tired, decreased energy 2 2 2  $ Change in appetite 2 3 2  $ Feeling bad or failure about yourself  1 1 0  Trouble concentrating 0 0 0  Moving slowly or fidgety/restless 0 0 0  Suicidal thoughts 0 0 0  PHQ-9 Score 9 9 6  $ Difficult doing work/chores Not difficult at all Somewhat difficult Very difficult      02/07/2023    5:09 PM 01/29/2023    5:06 PM 07/12/2022    9:24 AM 05/31/2022   11:14 AM  GAD 7 : Generalized Anxiety Score  Nervous, Anxious, on Edge 1 1 1 2  $ Control/stop worrying 1 2 1 2  $ Worry too much - different things 1 2 1 2  $ Trouble relaxing 1 0 0 0  Restless 0 0 1 0  Easily annoyed or irritable 0 0 1 0  Afraid - awful might happen 0 0 1 1  Total GAD 7 Score 4 5 6 7  $ Anxiety Difficulty Not difficult at all Not difficult at all Somewhat difficult Not difficult  at all      Social History   Tobacco Use   Smoking status: Former    Packs/day: 0.50    Years: 1.00    Total pack years: 0.50    Types: Cigarettes    Quit date: 07/26/1977    Years since quitting: 45.5    Passive exposure: Never   Smokeless tobacco: Never   Tobacco comments:    barley smoked when smoked   Vaping Use   Vaping Use: Never used  Substance Use Topics   Alcohol use: Yes    Alcohol/week: 0.0 standard drinks of alcohol    Comment: on occasion 1-2 x month   Drug use: No    Review of Systems Per HPI unless specifically indicated above     Objective:    BP (!) 150/84   Pulse 77   Ht 5' 2.21" (1.58 m)   Wt 183 lb 3.2 oz  (83.1 kg)   SpO2 99%   BMI 33.28 kg/m   Wt Readings from Last 3 Encounters:  02/07/23 183 lb 3.2 oz (83.1 kg)  10/09/22 178 lb 9.6 oz (81 kg)  07/12/22 172 lb 6.4 oz (78.2 kg)    Physical Exam Vitals and nursing note reviewed.  Constitutional:      General: She is not in acute distress.    Appearance: She is well-developed. She is not diaphoretic.     Comments: Well-appearing, comfortable, cooperative  HENT:     Head: Normocephalic and atraumatic.  Eyes:     General:        Right eye: No discharge.        Left eye: No discharge.     Conjunctiva/sclera: Conjunctivae normal.  Neck:     Thyroid: No thyromegaly.  Cardiovascular:     Rate and Rhythm: Normal rate and regular rhythm.     Heart sounds: Normal heart sounds. No murmur heard. Pulmonary:     Effort: Pulmonary effort is normal. No respiratory distress.     Breath sounds: Normal breath sounds. No wheezing or rales.  Musculoskeletal:     Cervical back: Normal range of motion and neck supple.     Comments: L knee in specialized brace  Lymphadenopathy:     Cervical: No cervical adenopathy.  Skin:    General: Skin is warm and dry.     Findings: No erythema or rash.  Neurological:     Mental Status: She is alert and oriented to person, place, and time.  Psychiatric:        Behavior: Behavior normal.     Comments: Well groomed, good eye contact, normal speech and thoughts     Results for orders placed or performed in visit on 07/12/22  COMPLETE METABOLIC PANEL WITH GFR  Result Value Ref Range   Glucose, Bld 115 (H) 65 - 99 mg/dL   BUN 18 7 - 25 mg/dL   Creat 0.85 0.50 - 1.05 mg/dL   eGFR 74 > OR = 60 mL/min/1.15m   BUN/Creatinine Ratio NOT APPLICABLE 6 - 22 (calc)   Sodium 142 135 - 146 mmol/L   Potassium 4.3 3.5 - 5.3 mmol/L   Chloride 104 98 - 110 mmol/L   CO2 32 20 - 32 mmol/L   Calcium 8.8 8.6 - 10.4 mg/dL   Total Protein 6.7 6.1 - 8.1 g/dL   Albumin 3.9 3.6 - 5.1 g/dL   Globulin 2.8 1.9 - 3.7 g/dL  (calc)   AG Ratio 1.4 1.0 - 2.5 (calc)   Total  Bilirubin 0.4 0.2 - 1.2 mg/dL   Alkaline phosphatase (APISO) 113 37 - 153 U/L   AST 13 10 - 35 U/L   ALT 10 6 - 29 U/L  CBC with Differential/Platelet  Result Value Ref Range   WBC 6.6 3.8 - 10.8 Thousand/uL   RBC 4.03 3.80 - 5.10 Million/uL   Hemoglobin 9.9 (L) 11.7 - 15.5 g/dL   HCT 32.1 (L) 35.0 - 45.0 %   MCV 79.7 (L) 80.0 - 100.0 fL   MCH 24.6 (L) 27.0 - 33.0 pg   MCHC 30.8 (L) 32.0 - 36.0 g/dL   RDW 16.1 (H) 11.0 - 15.0 %   Platelets 330 140 - 400 Thousand/uL   MPV 9.5 7.5 - 12.5 fL   Neutro Abs 4,607 1,500 - 7,800 cells/uL   Lymphs Abs 1,333 850 - 3,900 cells/uL   Absolute Monocytes 462 200 - 950 cells/uL   Eosinophils Absolute 178 15 - 500 cells/uL   Basophils Absolute 20 0 - 200 cells/uL   Neutrophils Relative % 69.8 %   Total Lymphocyte 20.2 %   Monocytes Relative 7.0 %   Eosinophils Relative 2.7 %   Basophils Relative 0.3 %  Magnesium  Result Value Ref Range   Magnesium 2.2 1.5 - 2.5 mg/dL      Assessment & Plan:   Problem List Items Addressed This Visit     Anxiety   Relevant Medications   LORazepam (ATIVAN) 1 MG tablet   Chiari malformation type I (HCC)   Relevant Medications   levETIRAcetam (KEPPRA) 500 MG tablet   Essential hypertension - Primary   Relevant Medications   carvedilol (COREG) 6.25 MG tablet   atorvastatin (LIPITOR) 40 MG tablet   Other Relevant Orders   COMPLETE METABOLIC PANEL WITH GFR   CBC with Differential/Platelet   GERD (gastroesophageal reflux disease)   Relevant Medications   omeprazole (PRILOSEC) 40 MG capsule   Hyperlipidemia   Relevant Medications   carvedilol (COREG) 6.25 MG tablet   atorvastatin (LIPITOR) 40 MG tablet   Other Relevant Orders   Lipid panel   TSH   Prediabetes   Relevant Orders   Hemoglobin A1c   Restless legs   Relevant Medications   gabapentin (NEURONTIN) 600 MG tablet   cyclobenzaprine (FLEXERIL) 10 MG tablet   Other Visit Diagnoses      Primary osteoarthritis of left knee       Relevant Medications   celecoxib (CELEBREX) 200 MG capsule   cyclobenzaprine (FLEXERIL) 10 MG tablet   Chronic pain of left knee       Relevant Medications   celecoxib (CELEBREX) 200 MG capsule   levETIRAcetam (KEPPRA) 500 MG tablet   gabapentin (NEURONTIN) 600 MG tablet   cyclobenzaprine (FLEXERIL) 10 MG tablet   Vitamin D deficiency       Relevant Orders   VITAMIN D 25 Hydroxy (Vit-D Deficiency, Fractures)   Vitamin B12 deficiency       Relevant Orders   Vitamin B12       Prevnar-20 vaccine at the pharmacy when you are ready.  Re ordered all of the medications, with plenty of refills  Anxiety Depression recurrent Followed by Psychiatry [facility] Refill Lorazepam Continue w/ plan to taper Viibryd and continue Buspar  Discussed stressors / provoking factors with Knee pain and Stressors at school/work  Hypertension Elevated BP due to pain and physical/mental stressors Failed Amlodipine, HCTZ, Atenolol On valsartan Will trial Carvedilol lower dose add on Future consider 2nd opinion on BP, but for  now will try to correct modifiable factors w/ knee pain stressors  Plan for future knee surgery in Summer. Will stay tuned on updates.  Labs next week  Meds ordered this encounter  Medications   levETIRAcetam (KEPPRA) 500 MG tablet    Sig: Take 1 tablet (500 mg total) by mouth 2 (two) times daily.    Dispense:  180 tablet    Refill:  3   omeprazole (PRILOSEC) 40 MG capsule    Sig: Take 1 capsule (40 mg total) by mouth daily before breakfast.    Dispense:  90 capsule    Refill:  3   gabapentin (NEURONTIN) 600 MG tablet    Sig: Take 1 tablet (600 mg total) by mouth at bedtime.    Dispense:  90 tablet    Refill:  3   carvedilol (COREG) 6.25 MG tablet    Sig: Take 1 tablet (6.25 mg total) by mouth 2 (two) times daily with a meal.    Dispense:  180 tablet    Refill:  3   cyclobenzaprine (FLEXERIL) 10 MG tablet    Sig: take 1 tablet by  mouth three times a day if needed for muscle spasm    Dispense:  90 tablet    Refill:  3   LORazepam (ATIVAN) 1 MG tablet    Sig: Take 0.5-1 tablets (0.5-1 mg total) by mouth daily as needed for anxiety.    Dispense:  30 tablet    Refill:  2   atorvastatin (LIPITOR) 40 MG tablet    Sig: Take 1 tablet (40 mg total) by mouth at bedtime.    Dispense:  90 tablet    Refill:  3      Follow up plan: Return in about 1 week (around 02/14/2023) for 1 week for fasting lab only, then can return in 6 months for follow-up.  Future labs ordered for next week   Nobie Putnam, DO [facility] 02/07/2023, 3:51 PM

## 2023-02-14 ENCOUNTER — Ambulatory Visit: Payer: Self-pay | Admitting: *Deleted

## 2023-02-14 NOTE — Telephone Encounter (Signed)
1745: Second attempt to reach pt, left VM to call back. Routing to practice for PCPs resolution per protocol.

## 2023-02-14 NOTE — Telephone Encounter (Signed)
Summary: Swelling in neck   Pt has swelling in her neck, daughter noticed today. Pt is concerned.  Best contact: [phone]          Called , patient [phone] to review neck swelling. No answer LVMTCB [phone].

## 2023-02-15 ENCOUNTER — Encounter: Payer: Self-pay | Admitting: Family Medicine

## 2023-02-15 ENCOUNTER — Ambulatory Visit: Payer: Medicare PPO | Admitting: Family Medicine

## 2023-02-15 ENCOUNTER — Other Ambulatory Visit: Payer: Medicare PPO

## 2023-02-15 VITALS — BP 140/88 | HR 74 | Temp 97.7°F | Ht 62.0 in | Wt 182.6 lb

## 2023-02-15 DIAGNOSIS — R590 Localized enlarged lymph nodes: Secondary | ICD-10-CM

## 2023-02-15 DIAGNOSIS — M7989 Other specified soft tissue disorders: Secondary | ICD-10-CM

## 2023-02-15 NOTE — Progress Notes (Signed)
Subjective:    Patient ID: Amanda Wang, female    DOB: September 26, 1951, 72 y.o.   MRN: [id number]  Amanda Wang is a 72 y.o. female presenting on 02/15/2023 for Acute Visit (Pt. States she has a swollen lymph node in her neck that her daughter saw yesterday. )  Patient presents for a same day appointment.   HPI  Lymphadenopathy Neck, anterior bilateral Clavicular region swelling soft tissue  No recent illness She felt her lymph nodes upper part of neck were felt swollen on either side, possibly 1 week Her daughter was concerned about swollen lymph nodes asked her to come in to be evaluated No similar lymph node problems before No significant pain but she felt the fullness and some nodules in neck Question if some says improve vs other Admits grind teeth at night. No TMJ No sore throat or sinus symptoms Denies unintentional weight loss Denies fever chills body aches headache cough congestion     02/07/2023    5:08 PM 01/29/2023    5:05 PM 10/09/2022    9:15 AM  Depression screen PHQ 2/9  Decreased Interest 1    Down, Depressed, Hopeless 1    PHQ - 2 Score 2    Altered sleeping 2    Tired, decreased energy 2    Change in appetite 2    Feeling bad or failure about yourself  1    Trouble concentrating 0    Moving slowly or fidgety/restless 0    Suicidal thoughts 0    PHQ-9 Score 9    Difficult doing work/chores Not difficult at all       Information is confidential and restricted. Go to Review Flowsheets to unlock data.    Social History   Tobacco Use   Smoking status: Former    Packs/day: 0.50    Years: 1.00    Total pack years: 0.50    Types: Cigarettes    Quit date: 07/26/1977    Years since quitting: 45.5    Passive exposure: Never   Smokeless tobacco: Never   Tobacco comments:    barley smoked when smoked   Vaping Use   Vaping Use: Never used  Substance Use Topics   Alcohol use: Yes    Alcohol/week: 0.0 standard drinks of alcohol    Comment: on  occasion 1-2 x month   Drug use: No    Review of Systems Per HPI unless specifically indicated above     Objective:    BP (!) 140/88   Pulse 74   Temp 97.7 F (36.5 C) (Oral)   Ht '5\' 2"'$  (1.575 m)   Wt 182 lb 9.6 oz (82.8 kg)   SpO2 100%   BMI 33.40 kg/m   Wt Readings from Last 3 Encounters:  02/15/23 182 lb 9.6 oz (82.8 kg)  02/07/23 183 lb 3.2 oz (83.1 kg)  07/12/22 172 lb 6.4 oz (78.2 kg)    Physical Exam Vitals and nursing note reviewed.  Constitutional:      General: She is not in acute distress.    Appearance: Normal appearance. She is well-developed. She is not diaphoretic.     Comments: Well-appearing, comfortable, cooperative  HENT:     Head: Normocephalic and atraumatic.  Eyes:     General:        Right eye: No discharge.        Left eye: No discharge.     Conjunctiva/sclera: Conjunctivae normal.  Neck:     Comments: No  obvious LAD anterior cervical submandibular superior region, submental. Seems area of concern is not evident today but it is potentially mildly swollen in this area non specific finding.  Superior Clavicular region bilaterally with some generalized edema non specific, no focal abnormality, seems symmetrical Cardiovascular:     Rate and Rhythm: Normal rate.  Pulmonary:     Effort: Pulmonary effort is normal.  Musculoskeletal:     Cervical back: Normal range of motion and neck supple. No rigidity or tenderness.  Lymphadenopathy:     Cervical: No cervical adenopathy.  Skin:    General: Skin is warm and dry.     Findings: No erythema or rash.  Neurological:     Mental Status: She is alert and oriented to person, place, and time.  Psychiatric:        Mood and Affect: Mood normal.        Behavior: Behavior normal.        Thought Content: Thought content normal.     Comments: Well groomed, good eye contact, normal speech and thoughts    Results for orders placed or performed in visit on 07/12/22  COMPLETE METABOLIC PANEL WITH GFR   Result Value Ref Range   Glucose, Bld 115 (H) 65 - 99 mg/dL   BUN 18 7 - 25 mg/dL   Creat 0.85 0.50 - 1.05 mg/dL   eGFR 74 > OR = 60 mL/min/1.42m   BUN/Creatinine Ratio NOT APPLICABLE 6 - 22 (calc)   Sodium 142 135 - 146 mmol/L   Potassium 4.3 3.5 - 5.3 mmol/L   Chloride 104 98 - 110 mmol/L   CO2 32 20 - 32 mmol/L   Calcium 8.8 8.6 - 10.4 mg/dL   Total Protein 6.7 6.1 - 8.1 g/dL   Albumin 3.9 3.6 - 5.1 g/dL   Globulin 2.8 1.9 - 3.7 g/dL (calc)   AG Ratio 1.4 1.0 - 2.5 (calc)   Total Bilirubin 0.4 0.2 - 1.2 mg/dL   Alkaline phosphatase (APISO) 113 37 - 153 U/L   AST 13 10 - 35 U/L   ALT 10 6 - 29 U/L  CBC with Differential/Platelet  Result Value Ref Range   WBC 6.6 3.8 - 10.8 Thousand/uL   RBC 4.03 3.80 - 5.10 Million/uL   Hemoglobin 9.9 (L) 11.7 - 15.5 g/dL   HCT 32.1 (L) 35.0 - 45.0 %   MCV 79.7 (L) 80.0 - 100.0 fL   MCH 24.6 (L) 27.0 - 33.0 pg   MCHC 30.8 (L) 32.0 - 36.0 g/dL   RDW 16.1 (H) 11.0 - 15.0 %   Platelets 330 140 - 400 Thousand/uL   MPV 9.5 7.5 - 12.5 fL   Neutro Abs 4,607 1,500 - 7,800 cells/uL   Lymphs Abs 1,333 850 - 3,900 cells/uL   Absolute Monocytes 462 200 - 950 cells/uL   Eosinophils Absolute 178 15 - 500 cells/uL   Basophils Absolute 20 0 - 200 cells/uL   Neutrophils Relative % 69.8 %   Total Lymphocyte 20.2 %   Monocytes Relative 7.0 %   Eosinophils Relative 2.7 %   Basophils Relative 0.3 %  Magnesium  Result Value Ref Range   Magnesium 2.2 1.5 - 2.5 mg/dL      Assessment & Plan:   Problem List Items Addressed This Visit   None Visit Diagnoses     Anterior cervical lymphadenopathy    -  Primary   Swelling of clavicular region           Lymph node  swelling - minimal today seems improved Neck soft tissue swelling is present but hopefully benign, it seems non specific.  If still persistent for either of these issues, within 2 weeks, please message or call me back and I can place the orders  Would suggest Neck Soft Tissue ultrasound  (non thyroid) and can consider Chest X-ray for the upper clavicle region here or at the same place  [place] at [facility] Address: [address] [place], [place] [address] Phone: [phone]  If symptoms do resolve but you would like some testing imaging anyway, that is fine too.  No orders of the defined types were placed in this encounter.     Follow up plan: Return if symptoms worsen or fail to improve.    Nobie Putnam, [facility] 02/15/2023, 11:44 AM

## 2023-02-15 NOTE — Patient Instructions (Addendum)
Thank you for coming to the office today.  Lymph node swelling - minimal today seems improved Neck soft tissue swelling is present but hopefully benign, it seems non specific.  If still persistent for either of these issues, within 2 weeks, please message or call me back and I can place the orders  Would suggest Neck Soft Tissue ultrasound (non thyroid) and can consider Chest X-ray for the upper clavicle region here or at the same place  [place] [facility] [facility] Address: [address] [place], [place] [address] Phone: [phone]  If symptoms do resolve but you would like some testing imaging anyway, that is fine too.   Please schedule a Follow-up Appointment to: Return if symptoms worsen or fail to improve.  If you have any other questions or concerns, please feel free to call the office or send a message through [place]. You may also schedule an earlier appointment if necessary.  Additionally, you may be receiving a survey about your experience at our office within a few days to 1 week by e-mail or mail. We value your feedback.  Nobie Putnam, DO [place]

## 2023-02-16 ENCOUNTER — Other Ambulatory Visit: Payer: Medicare PPO

## 2023-02-19 ENCOUNTER — Other Ambulatory Visit: Payer: Self-pay

## 2023-02-19 DIAGNOSIS — I10 Essential (primary) hypertension: Secondary | ICD-10-CM

## 2023-02-19 DIAGNOSIS — E782 Mixed hyperlipidemia: Secondary | ICD-10-CM

## 2023-02-19 DIAGNOSIS — R7303 Prediabetes: Secondary | ICD-10-CM

## 2023-02-19 DIAGNOSIS — E559 Vitamin D deficiency, unspecified: Secondary | ICD-10-CM

## 2023-02-19 DIAGNOSIS — E538 Deficiency of other specified B group vitamins: Secondary | ICD-10-CM

## 2023-02-20 ENCOUNTER — Other Ambulatory Visit: Payer: Medicare PPO

## 2023-02-20 DIAGNOSIS — E782 Mixed hyperlipidemia: Secondary | ICD-10-CM | POA: Diagnosis not present

## 2023-02-20 DIAGNOSIS — D508 Other iron deficiency anemias: Secondary | ICD-10-CM | POA: Diagnosis not present

## 2023-02-20 DIAGNOSIS — I10 Essential (primary) hypertension: Secondary | ICD-10-CM | POA: Diagnosis not present

## 2023-02-20 DIAGNOSIS — E559 Vitamin D deficiency, unspecified: Secondary | ICD-10-CM | POA: Diagnosis not present

## 2023-02-20 DIAGNOSIS — R7303 Prediabetes: Secondary | ICD-10-CM | POA: Diagnosis not present

## 2023-02-20 DIAGNOSIS — D649 Anemia, unspecified: Secondary | ICD-10-CM | POA: Diagnosis not present

## 2023-02-20 DIAGNOSIS — E538 Deficiency of other specified B group vitamins: Secondary | ICD-10-CM | POA: Diagnosis not present

## 2023-02-23 ENCOUNTER — Encounter: Payer: Self-pay | Admitting: Family Medicine

## 2023-02-23 ENCOUNTER — Other Ambulatory Visit: Payer: Self-pay | Admitting: Family Medicine

## 2023-02-23 ENCOUNTER — Ambulatory Visit: Payer: Medicare PPO | Admitting: Family Medicine

## 2023-02-23 VITALS — BP 148/92 | HR 87 | Wt 182.0 lb

## 2023-02-23 DIAGNOSIS — R590 Localized enlarged lymph nodes: Secondary | ICD-10-CM

## 2023-02-23 DIAGNOSIS — D649 Anemia, unspecified: Secondary | ICD-10-CM | POA: Diagnosis not present

## 2023-02-23 DIAGNOSIS — E1169 Type 2 diabetes mellitus with other specified complication: Secondary | ICD-10-CM

## 2023-02-23 DIAGNOSIS — D508 Other iron deficiency anemias: Secondary | ICD-10-CM | POA: Diagnosis not present

## 2023-02-23 DIAGNOSIS — Z903 Acquired absence of stomach [part of]: Secondary | ICD-10-CM | POA: Diagnosis not present

## 2023-02-23 DIAGNOSIS — M7989 Other specified soft tissue disorders: Secondary | ICD-10-CM

## 2023-02-23 MED ORDER — METFORMIN HCL 500 MG PO TABS: 1000.0000 mg | ORAL_TABLET | Freq: Two times a day (BID) | ORAL | 0 refills | Status: DC

## 2023-02-23 NOTE — Patient Instructions (Addendum)
Thank you for coming to the office today.  Type 2 Diabetes based on the sugar trend.  Hoepfully we can fix this with natural lifestyle We may trial medication First can try Metformin start with low dose and work your way up. Let me know if difficulty tolerating it.  Referral to hematology / oncology for evaluation and treatment of the anemia  [place] at [facility] (Hematology/Oncology) [address] [place], [place] [address] Phone: [phone]  They will likely do iron infusion through IV and repeat panels.  We can try to add an anemia panel to the lab now.  Future for sugar we can consider Rybelsus, Mounjaro and other options.   Please schedule a Follow-up Appointment to: Return in about 4 months (around 06/25/2023) for 4 month follow-up DM A1c, anemia updates.  If you have any other questions or concerns, please feel free to call the office or send a message through [place]. You may also schedule an earlier appointment if necessary.  Additionally, you may be receiving a survey about your experience at our office within a few days to 1 week by e-mail or mail. We value your feedback.  Nobie Putnam, DO [place]

## 2023-02-23 NOTE — Progress Notes (Signed)
Subjective:    Patient ID: Amanda Wang, female    DOB: 1951/02/20, 72 y.o.   MRN: [id number]  Amanda Wang is a 72 y.o. female presenting on 02/23/2023 for lab results and Diabetes   HPI  IDA, symptomatic anemia, microcytic Malabsorption S/p Gastric Sleeve Surgery History of bariatric surgery. did well initially and did not gain weight - Then she got nausea and vomiting - she developed hiatal hernia, she would only be able to eat small bites and then would get sick with nausea vomiting  Dx with SIBO - small intestinal bacterial overgrowth Treated w/ Xifaxan and did well overall.  Previously 2022 she had anemia, and symptomatic concerns fatigue - she saw Dr Vicente Males did EGD and Colonoscopy, removed 1 polyp, then felt better, and then resolved all of her symptoms.  Now she admits feeling tired again - Admits poor lifestyle with diet. She admits feeling bad with nutrition, drinking more soda and ice cream. Eating more frequent snacks.   On Vitamin D  Type 2 Diabetes, new diagnosis A1c up to 7.3 now on recent lab. Highest A1c 6.7 in the past Saw Dietician She would check sugar occasional but now not checking Admits poor diet, eating out, junk food, sodas.    Past Surgical History:  Procedure Laterality Date   BREAST BIOPSY Right    neg   chiari malformation I decompression surgery  11-06   COLONOSCOPY WITH PROPOFOL N/A 11/28/2018   Procedure: COLONOSCOPY WITH PROPOFOL;  Surgeon: Jonathon Bellows, MD;  Location: [facility] ENDOSCOPY;  Service: Gastroenterology;  Laterality: N/A;   COLONOSCOPY WITH PROPOFOL N/A 10/27/2021   Procedure: COLONOSCOPY WITH PROPOFOL;  Surgeon: Jonathon Bellows, MD;  Location: [facility] ENDOSCOPY;  Service: Gastroenterology;  Laterality: N/A;   deviated septum repair     ESOPHAGOGASTRODUODENOSCOPY N/A 10/27/2021   Procedure: ESOPHAGOGASTRODUODENOSCOPY (EGD);  Surgeon: Jonathon Bellows, MD;  Location: [facility] ENDOSCOPY;  Service: Gastroenterology;  Laterality: N/A;   HERNIA  REPAIR     KNEE ARTHROSCOPY WITH MEDIAL MENISECTOMY Left 01/12/2022   Procedure: KNEE ARTHROSCOPY WITH MEDIAL MENISECTOMY;  Surgeon: Thornton Park, MD;  Location: [facility] ORS;  Service: Orthopedics;  Laterality: Left;        02/23/2023    4:30 PM 02/07/2023    5:08 PM 01/29/2023    5:05 PM  Depression screen PHQ 2/9  Decreased Interest 1 1   Down, Depressed, Hopeless 0 1   PHQ - 2 Score 1 2   Altered sleeping 1 2   Tired, decreased energy 2 2   Change in appetite 2 2   Feeling bad or failure about yourself  0 1   Trouble concentrating 0 0   Moving slowly or fidgety/restless 0 0   Suicidal thoughts 0 0   PHQ-9 Score 6 9   Difficult doing work/chores Somewhat difficult Not difficult at all      Information is confidential and restricted. Go to Review Flowsheets to unlock data.    Social History   Tobacco Use   Smoking status: Former    Packs/day: 0.50    Years: 1.00    Total pack years: 0.50    Types: Cigarettes    Quit date: 07/26/1977    Years since quitting: 45.6    Passive exposure: Never   Smokeless tobacco: Never   Tobacco comments:    barley smoked when smoked   Vaping Use   Vaping Use: Never used  Substance Use Topics   Alcohol use: Yes    Alcohol/week: 0.0 standard  drinks of alcohol    Comment: on occasion 1-2 x month   Drug use: No    Review of Systems Per HPI unless specifically indicated above     Objective:    BP (!) 148/92 (BP Location: Left Arm, Cuff Size: Normal)   Pulse 87   Wt 182 lb (82.6 kg)   SpO2 98%   BMI 33.29 kg/m   Wt Readings from Last 3 Encounters:  02/23/23 182 lb (82.6 kg)  02/15/23 182 lb 9.6 oz (82.8 kg)  02/07/23 183 lb 3.2 oz (83.1 kg)    Physical Exam Vitals and nursing note reviewed.  Constitutional:      General: She is not in acute distress.    Appearance: Normal appearance. She is well-developed. She is not diaphoretic.     Comments: Well-appearing, comfortable, cooperative  HENT:     Head: Normocephalic and  atraumatic.  Eyes:     General:        Right eye: No discharge.        Left eye: No discharge.     Conjunctiva/sclera: Conjunctivae normal.  Cardiovascular:     Rate and Rhythm: Normal rate.  Pulmonary:     Effort: Pulmonary effort is normal.  Skin:    General: Skin is warm and dry.     Findings: No erythema or rash.  Neurological:     Mental Status: She is alert and oriented to person, place, and time.  Psychiatric:        Mood and Affect: Mood normal.        Behavior: Behavior normal.        Thought Content: Thought content normal.     Comments: Well groomed, good eye contact, normal speech and thoughts      Results for orders placed or performed in visit on 02/19/23  VITAMIN D 25 Hydroxy (Vit-D Deficiency, Fractures)  Result Value Ref Range   Vit D, 25-Hydroxy 72 30 - 100 ng/mL  Vitamin B12  Result Value Ref Range   Vitamin B-12 561 200 - 1,100 pg/mL  TSH  Result Value Ref Range   TSH 3.47 0.40 - 4.50 mIU/L  Hemoglobin A1c  Result Value Ref Range   Hgb A1c MFr Bld 7.3 (H) <5.7 % of total Hgb   Mean Plasma Glucose 163 mg/dL   eAG (mmol/L) 9.0 mmol/L  Lipid panel  Result Value Ref Range   Cholesterol 134 <200 mg/dL   HDL 48 (L) > OR = 50 mg/dL   Triglycerides 119 <150 mg/dL   LDL Cholesterol (Calc) 65 mg/dL (calc)   Total CHOL/HDL Ratio 2.8 <5.0 (calc)   Non-HDL Cholesterol (Calc) 86 <130 mg/dL (calc)  CBC with Differential/Platelet  Result Value Ref Range   WBC 6.5 3.8 - 10.8 Thousand/uL   RBC 4.04 3.80 - 5.10 Million/uL   Hemoglobin 8.5 (L) 11.7 - 15.5 g/dL   HCT 29.7 (L) 35.0 - 45.0 %   MCV 73.5 (L) 80.0 - 100.0 fL   MCH 21.0 (L) 27.0 - 33.0 pg   MCHC 28.6 (L) 32.0 - 36.0 g/dL   RDW 17.7 (H) 11.0 - 15.0 %   Platelets 440 (H) 140 - 400 Thousand/uL   MPV 9.6 7.5 - 12.5 fL   Neutro Abs 4,661 1,500 - 7,800 cells/uL   Lymphs Abs 1,099 850 - 3,900 cells/uL   Absolute Monocytes 429 200 - 950 cells/uL   Eosinophils Absolute 273 15 - 500 cells/uL   Basophils  Absolute 39 0 - 200 cells/uL  Neutrophils Relative % 71.7 %   Total Lymphocyte 16.9 %   Monocytes Relative 6.6 %   Eosinophils Relative 4.2 %   Basophils Relative 0.6 %  COMPLETE METABOLIC PANEL WITH GFR  Result Value Ref Range   Glucose, Bld 126 (H) 65 - 99 mg/dL   BUN 14 7 - 25 mg/dL   Creat 0.67 0.60 - 1.00 mg/dL   eGFR 94 > OR = 60 mL/min/1.16m   BUN/Creatinine Ratio SEE NOTE: 6 - 22 (calc)   Sodium 142 135 - 146 mmol/L   Potassium 4.3 3.5 - 5.3 mmol/L   Chloride 107 98 - 110 mmol/L   CO2 26 20 - 32 mmol/L   Calcium 8.4 (L) 8.6 - 10.4 mg/dL   Total Protein 6.8 6.1 - 8.1 g/dL   Albumin 3.6 3.6 - 5.1 g/dL   Globulin 3.2 1.9 - 3.7 g/dL (calc)   AG Ratio 1.1 1.0 - 2.5 (calc)   Total Bilirubin 0.4 0.2 - 1.2 mg/dL   Alkaline phosphatase (APISO) 115 37 - 153 U/L   AST 13 10 - 35 U/L   ALT 9 6 - 29 U/L      Assessment & Plan:   Problem List Items Addressed This Visit     Absolute anemia   Relevant Orders   Ambulatory referral to Hematology / Oncology   S/P gastric sleeve procedure   Relevant Orders   Ambulatory referral to Hematology / Oncology   Symptomatic anemia   Relevant Orders   Ambulatory referral to Hematology / Oncology   Type 2 diabetes mellitus with other specified complication (HCC) - Primary   Relevant Medications   metFORMIN (GLUCOPHAGE) 500 MG tablet   Other Visit Diagnoses     Anterior cervical lymphadenopathy       Relevant Orders   Ambulatory referral to Hematology / Oncology   Swelling of clavicular region       Relevant Orders   Ambulatory referral to Hematology / Oncology       Type 2 Diabetes, new diagnosis Prior PreDM w elevated sugar Attributed to poor diet lifestyle Start trial on Metformin dose titration 500 up to 1000 TWICE A DAY Review GLP therapy for future consideration  Symptomatic Anemia IDA Malabsorption s/p gastric sleeve surgery  Recently 2022 had colonoscopy, no acute GI source blood loss, s/p polypectomy Now  recurrence of anemia Hgb 8.5 with microcytic anema profile  Difficulty w oral iron due to gastric sleeve  Referral to hematology / oncology for evaluation and treatment of the anemia Anticipate IV iron infusion as indicated Add anemia panel to [facility] lab now  Additionally history of lymphadenopathy and supraclavicular fullness  [facility] [address] [place] [id number]: [phone] Orders Placed This Encounter  Procedures   Ambulatory referral to Hematology / Oncology    Referral Priority:   Routine    Referral Type:   Consultation    Referral Reason:   Specialty Services Required    Requested Specialty:   Oncology    Number of Visits Requested:   1     Meds ordered this encounter  Medications   metFORMIN (GLUCOPHAGE) 500 MG tablet    Sig: Take 2 tablets (1,000 mg total) by mouth 2 (two) times daily with a meal.    Dispense:  120 tablet    Refill:  0      Follow up plan: Return in about 4 months (around 06/25/2023) for 4 month follow-up  DM A1c, anemia updates.   Amanda Wang, [facility] 02/23/2023, 4:45 PM

## 2023-02-25 LAB — LIPID PANEL
Cholesterol: 134 mg/dL (ref <200)
HDL: 48 mg/dL — ABNORMAL LOW (ref >=50)
LDL Cholesterol (Calc): 65 mg/dL (calc)
Non-HDL Cholesterol (Calc): 86 mg/dL (calc) (ref <130)
Total CHOL/HDL Ratio: 2.8 (calc) (ref <5.0)
Triglycerides: 119 mg/dL (ref <150)

## 2023-02-25 LAB — CBC WITH DIFFERENTIAL/PLATELET
Absolute Monocytes: 429 cells/uL (ref 200–950)
Basophils Absolute: 39 cells/uL (ref 0–200)
Basophils Relative: 0.6 %
Eosinophils Absolute: 273 cells/uL (ref 15–500)
Eosinophils Relative: 4.2 %
HCT: 29.7 % — ABNORMAL LOW (ref 35.0–45.0)
Hemoglobin: 8.5 g/dL — ABNORMAL LOW (ref 11.7–15.5)
Lymphs Abs: 1099 cells/uL (ref 850–3900)
MCH: 21 pg — ABNORMAL LOW (ref 27.0–33.0)
MCHC: 28.6 g/dL — ABNORMAL LOW (ref 32.0–36.0)
MCV: 73.5 fL — ABNORMAL LOW (ref 80.0–100.0)
MPV: 9.6 fL (ref 7.5–12.5)
Monocytes Relative: 6.6 %
Neutro Abs: 4661 cells/uL (ref 1500–7800)
Neutrophils Relative %: 71.7 %
Platelets: 440 10*3/uL — ABNORMAL HIGH (ref 140–400)
RBC: 4.04 10*6/uL (ref 3.80–5.10)
RDW: 17.7 % — ABNORMAL HIGH (ref 11.0–15.0)
Total Lymphocyte: 16.9 %
WBC: 6.5 10*3/uL (ref 3.8–10.8)

## 2023-02-25 LAB — IRON,TIBC AND FERRITIN PANEL
%SAT: 9 % (calc) — ABNORMAL LOW (ref 16–45)
Ferritin: 6 ng/mL — ABNORMAL LOW (ref 16–288)
Iron: 30 ug/dL — ABNORMAL LOW (ref 45–160)
TIBC: 342 mcg/dL (calc) (ref 250–450)

## 2023-02-25 LAB — COMPLETE METABOLIC PANEL WITH GFR
AG Ratio: 1.1 (calc) (ref 1.0–2.5)
ALT: 9 U/L (ref 6–29)
AST: 13 U/L (ref 10–35)
Albumin: 3.6 g/dL (ref 3.6–5.1)
Alkaline phosphatase (APISO): 115 U/L (ref 37–153)
BUN: 14 mg/dL (ref 7–25)
CO2: 26 mmol/L (ref 20–32)
Calcium: 8.4 mg/dL — ABNORMAL LOW (ref 8.6–10.4)
Chloride: 107 mmol/L (ref 98–110)
Creat: 0.67 mg/dL (ref 0.60–1.00)
Globulin: 3.2 g/dL (calc) (ref 1.9–3.7)
Glucose, Bld: 126 mg/dL — ABNORMAL HIGH (ref 65–99)
Potassium: 4.3 mmol/L (ref 3.5–5.3)
Sodium: 142 mmol/L (ref 135–146)
Total Bilirubin: 0.4 mg/dL (ref 0.2–1.2)
Total Protein: 6.8 g/dL (ref 6.1–8.1)
eGFR: 94 mL/min/{1.73_m2} (ref >=60)

## 2023-02-25 LAB — HEMOGLOBIN A1C
Hgb A1c MFr Bld: 7.3 % of total Hgb — ABNORMAL HIGH (ref <5.7)
Mean Plasma Glucose: 163 mg/dL
eAG (mmol/L): 9 mmol/L

## 2023-02-25 LAB — TSH: TSH: 3.47 mIU/L (ref 0.40–4.50)

## 2023-02-25 LAB — VITAMIN B12: Vitamin B-12: 561 pg/mL (ref 200–1100)

## 2023-02-25 LAB — TEST AUTHORIZATION

## 2023-02-25 LAB — VITAMIN D 25 HYDROXY (VIT D DEFICIENCY, FRACTURES): Vit D, 25-Hydroxy: 72 ng/mL (ref 30–100)

## 2023-02-26 ENCOUNTER — Other Ambulatory Visit: Payer: Self-pay | Admitting: Psychiatry

## 2023-02-26 NOTE — Telephone Encounter (Signed)
Unable to refill per protocol, Rx request is too soon. Last refill 02/23/23 for 30 days.  Requested Prescriptions  Pending Prescriptions Disp Refills   metFORMIN (GLUCOPHAGE) 500 MG tablet [Pharmacy Med Name: METFORMIN '500MG'$  TABLETS] 360 tablet     Sig: TAKE 2 TABLETS(1000 MG) BY MOUTH TWICE DAILY WITH A MEAL     Endocrinology:  Diabetes - Biguanides Failed - 02/23/2023  6:12 PM      Failed - CBC within normal limits and completed in the last 12 months    WBC  Date Value Ref Range Status  02/20/2023 6.5 3.8 - 10.8 Thousand/uL Final   RBC  Date Value Ref Range Status  02/20/2023 4.04 3.80 - 5.10 Million/uL Final   Hemoglobin  Date Value Ref Range Status  02/20/2023 8.5 (L) 11.7 - 15.5 g/dL Final  12/30/2021 12.2 11.1 - 15.9 g/dL Final   HCT  Date Value Ref Range Status  02/20/2023 29.7 (L) 35.0 - 45.0 % Final   Hematocrit  Date Value Ref Range Status  12/30/2021 38.0 34.0 - 46.6 % Final   MCHC  Date Value Ref Range Status  02/20/2023 28.6 (L) 32.0 - 36.0 g/dL Final   [facility]  Date Value Ref Range Status  02/20/2023 21.0 (L) 27.0 - 33.0 pg Final   MCV  Date Value Ref Range Status  02/20/2023 73.5 (L) 80.0 - 100.0 fL Final  12/30/2021 79 79 - 97 fL Final  07/24/2012 86 80 - 100 fL Final   No results found for: "PLTCOUNTKUC", "LABPLAT", "POCPLA" RDW  Date Value Ref Range Status  02/20/2023 17.7 (H) 11.0 - 15.0 % Final  12/30/2021 16.8 (H) 11.7 - 15.4 % Final  07/24/2012 14.6 (H) 11.5 - 14.5 % Final         Passed - Cr in normal range and within 360 days    Creatinine  Date Value Ref Range Status  01/11/2021 54.2 20.0 - 300.0 mg/dL Final   Creat  Date Value Ref Range Status  02/20/2023 0.67 0.60 - 1.00 mg/dL Final         Passed - HBA1C is between 0 and 7.9 and within 180 days    Hemoglobin A1C  Date Value Ref Range Status  07/24/2012 6.4 (H) 4.2 - 6.3 % Final    Comment:    The American Diabetes Association recommends that a primary goal of therapy should be  <7% and that physicians should reevaluate the treatment regimen in patients with HbA1c values consistently >8%.    HB A1C (BAYER DCA - WAIVED)  Date Value Ref Range Status  04/25/2022 5.8 (H) 4.8 - 5.6 % Final    Comment:             Prediabetes: 5.7 - 6.4          Diabetes: >6.4          Glycemic control for adults with diabetes: <7.0    Hgb A1c MFr Bld  Date Value Ref Range Status  02/20/2023 7.3 (H) <5.7 % of total Hgb Final    Comment:    For someone without known diabetes, a hemoglobin A1c value of 6.5% or greater indicates that they may have  diabetes and this should be confirmed with a follow-up  test. . For someone with known diabetes, a value <7% indicates  that their diabetes is well controlled and a value  greater than or equal to 7% indicates suboptimal  control. A1c targets should be individualized based on  duration of diabetes,  age, comorbid conditions, and  other considerations. . Currently, no consensus exists regarding use of hemoglobin A1c for diagnosis of diabetes for children. .          Passed - eGFR in normal range and within 360 days    EGFR (African American)  Date Value Ref Range Status  07/24/2012 >60  Final   GFR calc Af Amer  Date Value Ref Range Status  01/11/2021 96 >59 mL/min/1.73 Final    Comment:    **In accordance with recommendations from the NKF-ASN Task force,**   [facility] is in the process of updating its eGFR calculation to the   2021 CKD-EPI creatinine equation that estimates kidney function   without a race variable.    EGFR (Non-African Amer.)  Date Value Ref Range Status  07/24/2012 >60  Final    Comment:    eGFR values <23m/min/1.73 m2 may be an indication of chronic kidney disease (CKD). Calculated eGFR is useful in patients with stable renal function. The eGFR calculation will not be reliable in acutely ill patients when serum creatinine is changing rapidly. It is not useful in  patients on dialysis. The eGFR  calculation may not be applicable to patients at the low and high extremes of body sizes, pregnant women, and vegetarians.    GFR, Estimated  Date Value Ref Range Status  01/04/2022 >60 >60 mL/min Final    Comment:    (NOTE) Calculated using the CKD-EPI Creatinine Equation (2021)    eGFR  Date Value Ref Range Status  02/20/2023 94 > OR = 60 mL/min/1.778mFinal  04/25/2022 79 >59 mL/min/1.73 Final         Passed - B12 Level in normal range and within 720 days    Vitamin B-12  Date Value Ref Range Status  02/20/2023 561 200 - 1,100 pg/mL Final         Passed - Valid encounter within last 6 months    Recent Outpatient Visits           3 days ago Type 2 diabetes mellitus with other specified complication, without long-term current use of insulin [facility]  [facility] Doughty[place]   1 week ago Anterior cervical lymphadenopathy   [place] Doughty[place]   2 weeks ago Essential hypertension   [place], [place]   7 months ago Essential hypertension   [facility]   9 months ago Essential hypertension   [place]       Future Appointments             In 5 months [place] RangerAlDevonne DoughtyDO [facility]

## 2023-02-27 ENCOUNTER — Encounter: Payer: Self-pay | Admitting: Internal Medicine

## 2023-02-27 ENCOUNTER — Other Ambulatory Visit: Payer: Self-pay | Admitting: Psychiatry

## 2023-02-27 ENCOUNTER — Inpatient Hospital Stay: Payer: Medicare PPO | Attending: Internal Medicine | Admitting: Internal Medicine

## 2023-02-27 ENCOUNTER — Inpatient Hospital Stay: Payer: Medicare PPO

## 2023-02-27 VITALS — BP 176/73 | HR 68 | Temp 98.7°F | Resp 18 | Ht 62.0 in | Wt 181.2 lb

## 2023-02-27 DIAGNOSIS — D508 Other iron deficiency anemias: Secondary | ICD-10-CM

## 2023-02-27 DIAGNOSIS — D509 Iron deficiency anemia, unspecified: Secondary | ICD-10-CM | POA: Diagnosis not present

## 2023-02-27 DIAGNOSIS — Z903 Acquired absence of stomach [part of]: Secondary | ICD-10-CM

## 2023-02-27 DIAGNOSIS — D649 Anemia, unspecified: Secondary | ICD-10-CM

## 2023-02-27 DIAGNOSIS — I10 Essential (primary) hypertension: Secondary | ICD-10-CM | POA: Insufficient documentation

## 2023-02-27 DIAGNOSIS — Z87891 Personal history of nicotine dependence: Secondary | ICD-10-CM | POA: Insufficient documentation

## 2023-02-27 DIAGNOSIS — Z9884 Bariatric surgery status: Secondary | ICD-10-CM | POA: Insufficient documentation

## 2023-02-27 DIAGNOSIS — R590 Localized enlarged lymph nodes: Secondary | ICD-10-CM | POA: Insufficient documentation

## 2023-02-27 NOTE — Progress Notes (Signed)
Amanda Wang  [phone] [phone]  ID: PAX VIALL OB: 06/08/1951  MR#: [id number]  [id number]  Patient Care Team: Olin Hauser, DO as PCP - General (Family Medicine) Lucilla Lame, MD as Consulting Physician (Gastroenterology)  REFERRING PROVIDER: Dr. Parks Ranger  REASON FOR REFERRAL: Iron deficiency anemia  HPI: Amanda Wang is a 72 y.o. female with past medical history of hypertension, hyperlipidemia, GERD, chiari malformation type I, arthritis, sleep apnea was referred to hematology for management of iron deficiency anemia  Patient was seen by primary on February 23, 2023 for symptomatic anemia.  She feels very tired.  CBC showed hemoglobin 8.5, MCV 73.5.  Iron panel showed saturation 9%.  Ferritin 6.  B12 level 561.  Patient has history of gastric bypass surgery.  She has chronic history of iron deficiency anemia.  Colonoscopy performed for the same reason by Dr. Vicente Males in November 2022 was normal.  Endoscopy showed multiple 5 to 10 mm sessile polyp without bleeding in the stomach.  Biopsy showed hyperplastic polyps.  She also reported palpable neck lymph nodes about 3 weeks ago which are tender.  Also reports puffiness around her neck.  Denies any recent URI.  Denies any recent weight loss. has noticed decline in her appetite.  Occasional night sweats.  REVIEW OF SYSTEMS:   ROS  As per HPI. Otherwise, a complete review of systems is negative.  PAST MEDICAL HISTORY: Past Medical History:  Diagnosis Date   Advanced care planning/counseling discussion 09/16/2018   Arthritis    Chiari malformation type I ([place])    Depression    GERD (gastroesophageal reflux disease)    History of hiatal hernia    Hyperlipidemia    Hypertension    PONV (postoperative nausea and vomiting)    Sleep apnea    Small intestinal bacterial overgrowth    Spinal stenosis    Vaccine reaction, initial encounter 03/11/2020    PAST SURGICAL  HISTORY: Past Surgical History:  Procedure Laterality Date   BREAST BIOPSY Right    neg   chiari malformation I decompression surgery  11-06   COLONOSCOPY WITH PROPOFOL N/A 11/28/2018   Procedure: COLONOSCOPY WITH PROPOFOL;  Surgeon: Jonathon Bellows, MD;  Location: [facility] ENDOSCOPY;  Service: Gastroenterology;  Laterality: N/A;   COLONOSCOPY WITH PROPOFOL N/A 10/27/2021   Procedure: COLONOSCOPY WITH PROPOFOL;  Surgeon: Jonathon Bellows, MD;  Location: [facility] ENDOSCOPY;  Service: Gastroenterology;  Laterality: N/A;   deviated septum repair     ESOPHAGOGASTRODUODENOSCOPY N/A 10/27/2021   Procedure: ESOPHAGOGASTRODUODENOSCOPY (EGD);  Surgeon: Jonathon Bellows, MD;  Location: [facility] ENDOSCOPY;  Service: Gastroenterology;  Laterality: N/A;   HERNIA REPAIR     KNEE ARTHROSCOPY WITH MEDIAL MENISECTOMY Left 01/12/2022   Procedure: KNEE ARTHROSCOPY WITH MEDIAL MENISECTOMY;  Surgeon: Thornton Park, MD;  Location: [facility] ORS;  Service: Orthopedics;  Laterality: Left;    FAMILY HISTORY: Family History  Problem Relation Age of Onset   Hypertension Mother    Cancer Mother    Stroke Mother    Heart disease Father    Breast cancer Sister 14   Breast cancer Paternal Aunt    Diabetes Paternal Grandmother     HEALTH MAINTENANCE: Social History   Tobacco Use   Smoking status: Former    Packs/day: 0.50    Years: 1.00    Total pack years: 0.50    Types: Cigarettes    Quit date: 07/26/1977    Years since quitting: 45.6    Passive exposure: Never   Smokeless tobacco:  Never   Tobacco comments:    barley smoked when smoked   Vaping Use   Vaping Use: Never used  Substance Use Topics   Alcohol use: Yes    Alcohol/week: 0.0 standard drinks of alcohol    Comment: on occasion 1-2 x month   Drug use: No     Allergies  Allergen Reactions   Fexofenadine Anxiety    Makes her have a panic attack   Codeine Nausea Only    Dizziness   Methylphenidate Hcl     "Caused a seizure"   Zoloft [Sertraline] Other (See  Comments)    Sleepiness   Magnevist [Gadopentetate] Hives and Itching    IV Contrast   Sulfa Antibiotics Rash    Current Outpatient Medications  Medication Sig Dispense Refill   Accu-Chek Softclix Lancets lancets TEST TWO TO THREE TIMES DAILY 100 each 2   acyclovir (ZOVIRAX) 200 MG capsule Take 200 mg by mouth 5 (five) times daily as needed (fever blisters).     albuterol (VENTOLIN HFA) 108 (90 Base) MCG/ACT inhaler INHALE 2 INHALATIONS INTO THE LUNGS EVERY 4 HOURS AS NEEDED     atorvastatin (LIPITOR) 40 MG tablet Take 1 tablet (40 mg total) by mouth at bedtime. 90 tablet 3   busPIRone (BUSPAR) 5 MG tablet Take 1 tablet (5 mg total) by mouth 2 (two) times daily. 180 tablet 0   Carboxymethylcellul-Glycerin (LUBRICATING EYE DROPS OP) Place 1 drop into both eyes daily as needed (dry eyes).     carvedilol (COREG) 6.25 MG tablet Take 1 tablet (6.25 mg total) by mouth 2 (two) times daily with a meal. 180 tablet 3   celecoxib (CELEBREX) 200 MG capsule Take 200 mg by mouth daily. Per Orthopedic     Charcoal Activated (ACTIVATED CHARCOAL PO) Take 2 capsules by mouth as needed (gas).     Cholecalciferol 125 MCG (5000 UT) TABS      cyclobenzaprine (FLEXERIL) 10 MG tablet take 1 tablet by mouth three times a day if needed for muscle spasm 90 tablet 3   gabapentin (NEURONTIN) 600 MG tablet Take 1 tablet (600 mg total) by mouth at bedtime. 90 tablet 3   levETIRAcetam (KEPPRA) 500 MG tablet Take 1 tablet (500 mg total) by mouth 2 (two) times daily. 180 tablet 3   Lidocaine-Menthol (ICY HOT MAX LIDOCAINE EX) Apply 1 application. topically daily as needed (pain).     LORazepam (ATIVAN) 1 MG tablet Take 0.5-1 tablets (0.5-1 mg total) by mouth daily as needed for anxiety. 30 tablet 2   metFORMIN (GLUCOPHAGE) 500 MG tablet Take 2 tablets (1,000 mg total) by mouth 2 (two) times daily with a meal. 120 tablet 0   omeprazole (PRILOSEC) 40 MG capsule Take 1 capsule (40 mg total) by mouth daily before breakfast. 90  capsule 3   valsartan (DIOVAN) 320 MG tablet Take 1 tablet (320 mg total) by mouth daily. 90 tablet 3   Vilazodone HCl (VIIBRYD) 40 MG TABS Take 1 tablet (40 mg total) by mouth daily. 90 tablet 0   No current facility-administered medications for this visit.    OBJECTIVE: Vitals:   02/27/23 1445  BP: (!) 167/75  Pulse: 70  Resp: 18  Temp: 98.7 F (37.1 C)  SpO2: 100%     Body mass index is 33.14 kg/m.      General: Well-developed, well-nourished, no acute distress. Eyes: Pink conjunctiva, anicteric sclera. HEENT: Normocephalic, moist mucous membranes, clear oropharnyx. Lungs: Clear to auscultation bilaterally. Heart: Regular rate and rhythm.  No rubs, murmurs, or gallops. Abdomen: Soft, nontender, nondistended. No organomegaly noted, normoactive bowel sounds. Musculoskeletal: No edema, cyanosis, or clubbing. Neuro: Alert, answering all questions appropriately. Cranial nerves grossly intact. Skin: No rashes or petechiae noted. Psych: Normal affect. Lymphatics: No cervical, calvicular, axillary or inguinal LAD.   LAB RESULTS:  Lab Results  Component Value Date   NA 142 02/20/2023   K 4.3 02/20/2023   CL 107 02/20/2023   CO2 26 02/20/2023   GLUCOSE 126 (H) 02/20/2023   BUN 14 02/20/2023   CREATININE 0.67 02/20/2023   CALCIUM 8.4 (L) 02/20/2023   PROT 6.8 02/20/2023   ALBUMIN 4.7 10/06/2021   AST 13 02/20/2023   ALT 9 02/20/2023   ALKPHOS 134 (H) 10/06/2021   BILITOT 0.4 02/20/2023   GFRNONAA >60 01/04/2022   GFRAA 96 01/11/2021    Lab Results  Component Value Date   WBC 6.5 02/20/2023   NEUTROABS 4,661 02/20/2023   HGB 8.5 (L) 02/20/2023   HCT 29.7 (L) 02/20/2023   MCV 73.5 (L) 02/20/2023   PLT 440 (H) 02/20/2023    Lab Results  Component Value Date   TIBC 342 02/20/2023   TIBC 298 12/30/2021   TIBC 323 10/07/2021   FERRITIN 6 (L) 02/20/2023   FERRITIN 27 12/30/2021   FERRITIN 17 10/07/2021   IRONPCTSAT 9 (L) 02/20/2023   IRONPCTSAT 14 (L)  12/30/2021   IRONPCTSAT 7 (LL) 10/07/2021     STUDIES: No results found.  ASSESSMENT AND PLAN:   Amanda Wang is a 72 y.o. female with pmh of hypertension, hyperlipidemia, GERD, chiari malformation type I, arthritis, sleep apnea was referred to hematology for management of iron deficiency anemia  # Iron deficiency anemia # History of gastric bypass surgery # Symptomatic anemia -Progressive.  Likely malabsorption from gastric bypass surgery. Colonoscopy performed by Dr. Vicente Males in November 2022 was normal.  Endoscopy showed multiple 5 to 10 mm sessile polyp without bleeding in the stomach.  Biopsy showed hyperplastic polyps.  -Recent labs showed ferritin of 6.  I discussed about IV Venofer 200 mg weekly x 5 doses.  Occasional side effects such as nausea and back pain was discussed.  Low but a potential risk of anaphylactic reaction was also discussed. -Advised to take vitamin B12 1000 mcg daily.  # Cervical lymphadenopathy -On exam, pea-sized tender palpable nodes.  Denies any recent URI.  Patient concerned about neck puffiness.  On labs, normal WBC with differential. -I will obtain ultrasound neck.  Patient expressed understanding and was in agreement with this plan. She also understands that She can call clinic at any time with any questions, concerns, or complaints.   I spent a total of 45 minutes reviewing chart data, face-to-face evaluation with the patient, counseling and coordination of care as detailed above.  Jane Canary, MD   02/27/2023 2:51 PM

## 2023-02-28 ENCOUNTER — Telehealth: Payer: Self-pay

## 2023-02-28 DIAGNOSIS — F411 Generalized anxiety disorder: Secondary | ICD-10-CM

## 2023-02-28 MED ORDER — BUSPIRONE HCL 5 MG PO TABS: 5.0000 mg | ORAL_TABLET | Freq: Two times a day (BID) | ORAL | 0 refills | Status: DC

## 2023-02-28 NOTE — Telephone Encounter (Signed)
I have sent a 30-day supply of BuSpar 5 mg twice daily since per pharmacy they did not receive the prescription sent out by Dr. Modesta Messing.  Will defer future refills to Dr. Modesta Messing.

## 2023-02-28 NOTE — Telephone Encounter (Signed)
spoke with Amanda Wang at the pharmacy she confirmed that they did not received the rx that was sent out on 01-29-23 @ 5:14. according to Amanda Wang they DO NOT have the rx. please resend.

## 2023-02-28 NOTE — Telephone Encounter (Signed)
Called to make patient aware of the prescription being sent pharmacy no answer left voicemail for patient check with her pharmacy and if she has any problems just call the office

## 2023-02-28 NOTE — Telephone Encounter (Signed)
pt called states she needed refill on the buspar.  Pt last seen on 01-29-23  and next appt 03-29-23

## 2023-03-02 ENCOUNTER — Inpatient Hospital Stay: Payer: Medicare PPO

## 2023-03-02 VITALS — BP 171/66 | HR 62 | Temp 97.9°F | Resp 16

## 2023-03-02 DIAGNOSIS — I10 Essential (primary) hypertension: Secondary | ICD-10-CM | POA: Diagnosis not present

## 2023-03-02 DIAGNOSIS — Z903 Acquired absence of stomach [part of]: Secondary | ICD-10-CM

## 2023-03-02 DIAGNOSIS — D509 Iron deficiency anemia, unspecified: Secondary | ICD-10-CM

## 2023-03-02 DIAGNOSIS — Z87891 Personal history of nicotine dependence: Secondary | ICD-10-CM | POA: Diagnosis not present

## 2023-03-02 DIAGNOSIS — Z9884 Bariatric surgery status: Secondary | ICD-10-CM | POA: Diagnosis not present

## 2023-03-02 DIAGNOSIS — M1712 Unilateral primary osteoarthritis, left knee: Secondary | ICD-10-CM | POA: Diagnosis not present

## 2023-03-02 DIAGNOSIS — R590 Localized enlarged lymph nodes: Secondary | ICD-10-CM | POA: Diagnosis not present

## 2023-03-02 MED ORDER — SODIUM CHLORIDE 0.9 % IV SOLN
Freq: Once | INTRAVENOUS | Status: AC
  Filled 2023-03-02: qty 250

## 2023-03-02 MED ORDER — SODIUM CHLORIDE 0.9 % IV SOLN
200.0000 mg | Freq: Once | INTRAVENOUS | Status: AC
  Administered 2023-03-02: 200 mg via INTRAVENOUS
  Filled 2023-03-02: qty 200

## 2023-03-07 ENCOUNTER — Ambulatory Visit
Admission: RE | Admit: 2023-03-07 | Discharge: 2023-03-07 | Disposition: A | Discharge status: Home / Self Care | Payer: Medicare PPO | Source: Ambulatory Visit | Attending: Internal Medicine | Admitting: Internal Medicine

## 2023-03-07 DIAGNOSIS — R590 Localized enlarged lymph nodes: Secondary | ICD-10-CM | POA: Diagnosis not present

## 2023-03-07 DIAGNOSIS — R221 Localized swelling, mass and lump, neck: Secondary | ICD-10-CM | POA: Diagnosis not present

## 2023-03-09 ENCOUNTER — Inpatient Hospital Stay: Payer: Medicare PPO

## 2023-03-09 VITALS — BP 155/70 | HR 71 | Temp 97.4°F | Resp 16

## 2023-03-09 DIAGNOSIS — D509 Iron deficiency anemia, unspecified: Secondary | ICD-10-CM

## 2023-03-09 DIAGNOSIS — Z87891 Personal history of nicotine dependence: Secondary | ICD-10-CM | POA: Diagnosis not present

## 2023-03-09 DIAGNOSIS — I10 Essential (primary) hypertension: Secondary | ICD-10-CM | POA: Diagnosis not present

## 2023-03-09 DIAGNOSIS — Z9884 Bariatric surgery status: Secondary | ICD-10-CM | POA: Diagnosis not present

## 2023-03-09 DIAGNOSIS — R590 Localized enlarged lymph nodes: Secondary | ICD-10-CM | POA: Diagnosis not present

## 2023-03-09 DIAGNOSIS — Z903 Acquired absence of stomach [part of]: Secondary | ICD-10-CM

## 2023-03-09 MED ORDER — SODIUM CHLORIDE 0.9 % IV SOLN
200.0000 mg | Freq: Once | INTRAVENOUS | Status: AC
  Administered 2023-03-09: 200 mg via INTRAVENOUS
  Filled 2023-03-09: qty 200

## 2023-03-09 MED ORDER — SODIUM CHLORIDE 0.9 % IV SOLN
Freq: Once | INTRAVENOUS | Status: AC
  Filled 2023-03-09: qty 250

## 2023-03-09 NOTE — Patient Instructions (Signed)

## 2023-03-16 ENCOUNTER — Inpatient Hospital Stay: Payer: Medicare PPO

## 2023-03-16 ENCOUNTER — Ambulatory Visit: Payer: Medicare PPO | Admitting: Internal Medicine

## 2023-03-16 ENCOUNTER — Encounter: Payer: Self-pay | Admitting: Internal Medicine

## 2023-03-16 VITALS — BP 166/71 | HR 72 | Temp 97.9°F | Resp 16

## 2023-03-16 VITALS — BP 136/82 | HR 87 | Temp 96.9°F | Wt 176.0 lb

## 2023-03-16 DIAGNOSIS — Z903 Acquired absence of stomach [part of]: Secondary | ICD-10-CM

## 2023-03-16 DIAGNOSIS — R590 Localized enlarged lymph nodes: Secondary | ICD-10-CM | POA: Diagnosis not present

## 2023-03-16 DIAGNOSIS — Z9109 Other allergy status, other than to drugs and biological substances: Secondary | ICD-10-CM | POA: Diagnosis not present

## 2023-03-16 DIAGNOSIS — R051 Acute cough: Secondary | ICD-10-CM | POA: Diagnosis not present

## 2023-03-16 DIAGNOSIS — I10 Essential (primary) hypertension: Secondary | ICD-10-CM | POA: Diagnosis not present

## 2023-03-16 DIAGNOSIS — D509 Iron deficiency anemia, unspecified: Secondary | ICD-10-CM

## 2023-03-16 DIAGNOSIS — J9801 Acute bronchospasm: Secondary | ICD-10-CM

## 2023-03-16 DIAGNOSIS — Z9884 Bariatric surgery status: Secondary | ICD-10-CM | POA: Diagnosis not present

## 2023-03-16 DIAGNOSIS — Z87891 Personal history of nicotine dependence: Secondary | ICD-10-CM | POA: Diagnosis not present

## 2023-03-16 LAB — POCT INFLUENZA A/B
Influenza A, POC: NEGATIVE
Influenza B, POC: NEGATIVE

## 2023-03-16 LAB — POC COVID19 BINAXNOW: SARS Coronavirus 2 Ag: NEGATIVE

## 2023-03-16 MED ORDER — SODIUM CHLORIDE 0.9 % IV SOLN
Freq: Once | INTRAVENOUS | Status: AC
  Filled 2023-03-16: qty 250

## 2023-03-16 MED ORDER — SODIUM CHLORIDE 0.9 % IV SOLN
200.0000 mg | Freq: Once | INTRAVENOUS | Status: AC
  Administered 2023-03-16: 200 mg via INTRAVENOUS
  Filled 2023-03-16: qty 200

## 2023-03-16 NOTE — Patient Instructions (Signed)

## 2023-03-16 NOTE — Patient Instructions (Signed)
Cough, Adult Coughing is a reflex that clears your throat and airways (respiratory system). It helps heal and protect your lungs. It is normal to cough from time to time. A cough that happens with other symptoms or that lasts a long time may be a sign of a condition that needs treatment. A short-term (acute) cough may only last 2-3 weeks. A long-term (chronic) cough may last 8 or more weeks. Coughing is often caused by: Diseases, such as: An infection of the respiratory system. Asthma or other heart or lung diseases. Gastroesophageal reflux. This is when acid comes back up from the stomach. Breathing in things that irritate your lungs. Allergies. Postnasal drip. This is when mucus runs down the back of your throat. Smoking. Some medicines. Follow these instructions at home: Medicines Take over-the-counter and prescription medicines only as told by your health care provider. Talk with your provider before you take cough medicine (cough suppressants). Eating and drinking Do not drink alcohol. Avoid caffeine. Drink enough fluid to keep your pee (urine) pale yellow. Lifestyle Avoid cigarette smoke. Do not use any products that contain nicotine or tobacco. These products include cigarettes, chewing tobacco, and vaping devices, such as e-cigarettes. If you need help quitting, ask your provider. Avoid things that make you cough. These may include perfumes, candles, cleaning products, or campfire smoke. General instructions  Watch for any changes to your cough. Tell your provider about them. Always cover your mouth when you cough. If the air is dry in your bedroom or home, use a cool mist vaporizer or humidifier. If your cough is worse at night, try to sleep in a semi-upright position. Rest as needed. Contact a health care provider if: You have new symptoms, or your symptoms get worse. You cough up pus. You have a fever that does not go away or a cough that does not get better after 2-3  weeks. You cannot control your cough with medicine, and you are losing sleep. You have pain that gets worse or is not helped with medicine. You lose weight for no clear reason. You have night sweats. Get help right away if: You cough up blood. You have trouble breathing. Your heart is beating very fast. These symptoms may be an emergency. Get help right away. Call 911. Do not wait to see if the symptoms will go away. Do not drive yourself to the hospital. This information is not intended to replace advice given to you by your health care provider. Make sure you discuss any questions you have with your health care provider. Document Revised: 08/04/2022 Document Reviewed: 08/04/2022 Elsevier Patient Education  2023 Elsevier Inc.  

## 2023-03-16 NOTE — Progress Notes (Signed)
Subjective:    Patient ID: Amanda Wang, female    DOB: 1951/11/29, 72 y.o.   MRN: [id number]  HPI  Pt presents to the clinic today with c/o cough and wheezing. This started 2 days ago.  The cough is mostly nonproductive.  She denies headache, runny nose, nasal congestion, ear pain, sore throat, chest pain, nausea, vomiting or diarrhea.  She denies fever, chills or body aches.  She has tried Albuterol with minimal relief of symptoms. She has a history of allergies but no history of asthma or COPD. She does not smoke. She has not had sick contacts that she is aware of.   Review of Systems     Past Medical History:  Diagnosis Date   Advanced care planning/counseling discussion 09/16/2018   Arthritis    Chiari malformation type I ([place])    Depression    GERD (gastroesophageal reflux disease)    History of hiatal hernia    Hyperlipidemia    Hypertension    PONV (postoperative nausea and vomiting)    Sleep apnea    Small intestinal bacterial overgrowth    Spinal stenosis    Vaccine reaction, initial encounter 03/11/2020    Current Outpatient Medications  Medication Sig Dispense Refill   Accu-Chek Softclix Lancets lancets TEST TWO TO THREE TIMES DAILY 100 each 2   acyclovir (ZOVIRAX) 200 MG capsule Take 200 mg by mouth 5 (five) times daily as needed (fever blisters).     albuterol (VENTOLIN HFA) 108 (90 Base) MCG/ACT inhaler INHALE 2 INHALATIONS INTO THE LUNGS EVERY 4 HOURS AS NEEDED     atorvastatin (LIPITOR) 40 MG tablet Take 1 tablet (40 mg total) by mouth at bedtime. 90 tablet 3   busPIRone (BUSPAR) 5 MG tablet Take 1 tablet (5 mg total) by mouth 2 (two) times daily. 60 tablet 0   Carboxymethylcellul-Glycerin (LUBRICATING EYE DROPS OP) Place 1 drop into both eyes daily as needed (dry eyes).     carvedilol (COREG) 6.25 MG tablet Take 1 tablet (6.25 mg total) by mouth 2 (two) times daily with a meal. 180 tablet 3   celecoxib (CELEBREX) 200 MG capsule Take 200 mg by mouth daily.  Per Orthopedic     Charcoal Activated (ACTIVATED CHARCOAL PO) Take 2 capsules by mouth as needed (gas).     Cholecalciferol 125 MCG (5000 UT) TABS      cyclobenzaprine (FLEXERIL) 10 MG tablet take 1 tablet by mouth three times a day if needed for muscle spasm 90 tablet 3   gabapentin (NEURONTIN) 600 MG tablet Take 1 tablet (600 mg total) by mouth at bedtime. 90 tablet 3   levETIRAcetam (KEPPRA) 500 MG tablet Take 1 tablet (500 mg total) by mouth 2 (two) times daily. 180 tablet 3   Lidocaine-Menthol (ICY HOT MAX LIDOCAINE EX) Apply 1 application. topically daily as needed (pain).     LORazepam (ATIVAN) 1 MG tablet Take 0.5-1 tablets (0.5-1 mg total) by mouth daily as needed for anxiety. 30 tablet 2   metFORMIN (GLUCOPHAGE) 500 MG tablet Take 2 tablets (1,000 mg total) by mouth 2 (two) times daily with a meal. 120 tablet 0   omeprazole (PRILOSEC) 40 MG capsule Take 1 capsule (40 mg total) by mouth daily before breakfast. 90 capsule 3   valsartan (DIOVAN) 320 MG tablet Take 1 tablet (320 mg total) by mouth daily. 90 tablet 3   Vilazodone HCl (VIIBRYD) 40 MG TABS Take 1 tablet (40 mg total) by mouth daily. 90 tablet 0  No current facility-administered medications for this visit.    Allergies  Allergen Reactions   Fexofenadine Anxiety    Makes her have a panic attack   Codeine Nausea Only    Dizziness   Methylphenidate Hcl     "Caused a seizure"   Zoloft [Sertraline] Other (See Comments)    Sleepiness   Magnevist [Gadopentetate] Hives and Itching    IV Contrast   Sulfa Antibiotics Rash    Family History  Problem Relation Age of Onset   Hypertension Mother    Cancer Mother    Stroke Mother    Heart disease Father    Breast cancer Sister 21   Breast cancer Paternal Aunt    Diabetes Paternal Grandmother     Social History   Socioeconomic History   Marital status: Divorced    Spouse name: Not on file   Number of children: 2   Years of education: Not on file   Highest  education level: Bachelor's degree (e.g., BA, AB, BS)  Occupational History   Occupation: part time   Tobacco Use   Smoking status: Former    Packs/day: 0.50    Years: 1.00    Additional pack years: 0.00    Total pack years: 0.50    Types: Cigarettes    Quit date: 07/26/1977    Years since quitting: 45.6    Passive exposure: Never   Smokeless tobacco: Never   Tobacco comments:    barley smoked when smoked   Vaping Use   Vaping Use: Never used  Substance and Sexual Activity   Alcohol use: Yes    Alcohol/week: 0.0 standard drinks of alcohol    Comment: on occasion 1-2 x month   Drug use: No   Sexual activity: Yes  Other Topics Concern   Not on file  Social History Narrative   Lives alone.   Social Determinants of Health   Financial Resource Strain: Low Risk  (10/17/2021)   Overall Financial Resource Strain (CARDIA)    Difficulty of Paying Living Expenses: Not hard at all  Food Insecurity: No Food Insecurity (10/17/2021)   Hunger Vital Sign    Worried About Running Out of Food in the Last Year: Never true    Ran Out of Food in the Last Year: Never true  Transportation Needs: No Transportation Needs (10/17/2021)   PRAPARE - Hydrologist (Medical): No    Lack of Transportation (Non-Medical): No  Physical Activity: Inactive (10/17/2021)   Exercise Vital Sign    Days of Exercise per Week: 0 days    Minutes of Exercise per Session: 0 min  Stress: Stress Concern Present (10/17/2021)   [place]    Feeling of Stress : To some extent  Social Connections: Moderately Integrated (09/22/2019)   Social Connection and Isolation Panel [NHANES]    Frequency of Communication with Friends and Family: More than three times a week    Frequency of Social Gatherings with Friends and Family: More than three times a week    Attends Religious Services: More than 4 times per year    Active Member of  [employer] or Organizations: Yes    Attends Music therapist: More than 4 times per year    Marital Status: Divorced  Intimate Partner Violence: Not At Risk (09/22/2019)   Humiliation, Afraid, Rape, and Kick questionnaire    Fear of Current or Ex-Partner: No    Emotionally Abused: No  Physically Abused: No    Sexually Abused: No     Constitutional: Denies fever, malaise, fatigue, headache or abrupt weight changes.  HEENT: Denies eye pain, eye redness, ear pain, ringing in the ears, wax buildup, runny nose, nasal congestion, bloody nose, or sore throat. Respiratory: Pt reports cough and wheezing. Denies difficulty breathing, or sputum production.   Cardiovascular: Denies chest pain, chest tightness, palpitations or swelling in the hands or feet.  Gastrointestinal: Denies abdominal pain, bloating, constipation, diarrhea or blood in the stool.   No other specific complaints in a complete review of systems (except as listed in HPI above).  Objective:   Physical Exam  BP 136/82 (BP Location: Right Arm, Patient Position: Sitting, Cuff Size: Normal)   Pulse 87   Temp (!) 96.9 F (36.1 C) (Temporal)   Wt 176 lb (79.8 kg)   SpO2 98%   BMI 32.19 kg/m  Wt Readings from Last 3 Encounters:  03/16/23 176 lb (79.8 kg)  02/27/23 181 lb 3.2 oz (82.2 kg)  02/23/23 182 lb (82.6 kg)    General: Appears her stated age, obese, in NAD. Skin: Warm, dry and intact. No rashes noted. HEENT: Head: normal shape and size, no sinus tenderness noted; Eyes: sclera white, no icterus, conjunctiva pink, PERRLA and EOMs intact; Throat/Mouth: Teeth present, mucosa pink and moist, no exudate, lesions or ulcerations noted.  Neck: No adenopathy noted. Cardiovascular: Normal rate and rhythm. S1,S2 noted.  No murmur, rubs or gallops noted.  Pulmonary/Chest: Normal effort and positive vesicular breath sounds. No respiratory distress. No wheezes, rales or ronchi noted.  Dry cough noted. Neurological: Alert  and oriented.   BMET    Component Value Date/Time   NA 142 02/20/2023 0939   NA 143 04/25/2022 1625   NA 142 07/24/2012 0928   K 4.3 02/20/2023 0939   K 4.2 07/24/2012 0928   CL 107 02/20/2023 0939   CL 104 07/24/2012 0928   CO2 26 02/20/2023 0939   CO2 33 (H) 07/24/2012 0928   GLUCOSE 126 (H) 02/20/2023 0939   GLUCOSE 117 (H) 07/24/2012 0928   BUN 14 02/20/2023 0939   BUN 22 04/25/2022 1625   BUN 17 07/24/2012 0928   CREATININE 0.67 02/20/2023 0939   CALCIUM 8.4 (L) 02/20/2023 0939   CALCIUM 8.5 07/24/2012 0928   GFRNONAA >60 01/04/2022 1515   GFRNONAA >60 07/24/2012 0928   GFRAA 96 01/11/2021 1132   GFRAA >60 07/24/2012 0928    Lipid Panel     Component Value Date/Time   CHOL 134 02/20/2023 0939   CHOL 158 05/31/2021 1138   CHOL 175 03/07/2018 1419   TRIG 119 02/20/2023 0939   TRIG 164 (H) 03/07/2018 1419   HDL 48 (L) 02/20/2023 0939   HDL 62 05/31/2021 1138   CHOLHDL 2.8 02/20/2023 0939   VLDL 33 (H) 03/07/2018 1419   LDLCALC 65 02/20/2023 0939    CBC    Component Value Date/Time   WBC 6.5 02/20/2023 0939   RBC 4.04 02/20/2023 0939   HGB 8.5 (L) 02/20/2023 0939   HGB 12.2 12/30/2021 1146   HCT 29.7 (L) 02/20/2023 0939   HCT 38.0 12/30/2021 1146   PLT 440 (H) 02/20/2023 0939   PLT 431 12/30/2021 1146   MCV 73.5 (L) 02/20/2023 0939   MCV 79 12/30/2021 1146   MCV 86 07/24/2012 0928   MCH 21.0 (L) 02/20/2023 0939   MCHC 28.6 (L) 02/20/2023 0939   RDW 17.7 (H) 02/20/2023 0939   RDW 16.8 (H)  12/30/2021 1146   RDW 14.6 (H) 07/24/2012 0928   LYMPHSABS 1,099 02/20/2023 0939   LYMPHSABS 1.3 12/30/2021 1146   LYMPHSABS 1.2 07/24/2012 0928   MONOABS 0.4 07/24/2012 0928   EOSABS 273 02/20/2023 0939   EOSABS 0.1 12/30/2021 1146   EOSABS 0.2 07/24/2012 0928   BASOSABS 39 02/20/2023 0939   BASOSABS 0.0 12/30/2021 1146   BASOSABS 0.0 07/24/2012 0928    Hgb A1C Lab Results  Component Value Date   HGBA1C 7.3 (H) 02/20/2023            Assessment &  Plan:   Cough, Wheezing secondary to Allergies:  She does not have any wheezing noted on exam Rapid COVID/flu negative Advised her I thought this was allergies and to have her start her montelukast that she has at home Continue Albuterol as needed She also has Promethazine DM cough syrup at home that she will try for cough suppression No indication for steroids or antibiotics at this time Webb Silversmith, NP

## 2023-03-19 ENCOUNTER — Ambulatory Visit (INDEPENDENT_AMBULATORY_CARE_PROVIDER_SITE_OTHER): Payer: Medicare PPO | Admitting: Family Medicine

## 2023-03-19 ENCOUNTER — Encounter: Payer: Self-pay | Admitting: Family Medicine

## 2023-03-19 ENCOUNTER — Telehealth: Payer: Self-pay | Admitting: Family Medicine

## 2023-03-19 VITALS — BP 124/68 | HR 81 | Temp 100.1°F | Ht 62.0 in | Wt 176.0 lb

## 2023-03-19 DIAGNOSIS — J9801 Acute bronchospasm: Secondary | ICD-10-CM

## 2023-03-19 DIAGNOSIS — J0110 Acute frontal sinusitis, unspecified: Secondary | ICD-10-CM

## 2023-03-19 MED ORDER — IPRATROPIUM BROMIDE 0.06 % NA SOLN: 2.0000 | Freq: Four times a day (QID) | NASAL | 0 refills | Status: DC

## 2023-03-19 MED ORDER — AMOXICILLIN-POT CLAVULANATE 875-125 MG PO TABS: 1.0000 | ORAL_TABLET | Freq: Two times a day (BID) | ORAL | 0 refills | Status: DC

## 2023-03-19 MED ORDER — AEROCHAMBER PLUS FLO-VU MISC
0 refills | Status: AC
Start: 2023-03-19 — End: ?

## 2023-03-19 MED ORDER — PREDNISONE 20 MG PO TABS: ORAL_TABLET | ORAL | 0 refills | Status: DC

## 2023-03-19 MED FILL — Iron Sucrose Inj 20 MG/ML (Fe Equiv): INTRAVENOUS | Qty: 10 | Status: AC

## 2023-03-19 NOTE — Telephone Encounter (Signed)
Signed order for aerochamber spacer sent to [place] 03/19/2023, 5:15 PM

## 2023-03-19 NOTE — Patient Instructions (Addendum)
Thank you for coming to the office today.  1. It sounds like you have a Sinusitis (Bacterial Infection) - this most likely started as an Upper Respiratory Virus that has settled into an infection. Allergies can also cause this. - Start Augmentin 1 pill twice daily (breakfast and dinner, with food and plenty of water) for 10 days, complete entire course, do not stop early even if feeling better - Start Prednisone taper 7 days - Recommend try using Nasal Saline spray multiple times a day to help flush out congestion and clear sinuses - Improve hydration by drinking plenty of clear fluids (water, gatorade) to reduce secretions and thin congestion - Congestion draining down throat can cause irritation. May try warm herbal tea with honey, cough drops - Can take Tylenol or Ibuprofen as needed for fevers - May continue over the counter cold medicine as you are, I would not use any decongestant or mucinex longer than 7 days.  If you develop persistent fever >101F for at least 3 consecutive days, headaches with sinus pain or pressure or persistent earache, please schedule a follow-up evaluation within next few days to week.   Please schedule a Follow-up Appointment to: No follow-ups on file.  If you have any other questions or concerns, please feel free to call the office or send a message through Kay. You may also schedule an earlier appointment if necessary.  Additionally, you may be receiving a survey about your experience at our office within a few days to 1 week by e-mail or mail. We value your feedback.  Nobie Putnam, DO [place]

## 2023-03-19 NOTE — Telephone Encounter (Signed)
Pt stated she forgot to ask Dr. Raliegh Ip this morning about getting a spacer for her inhaler. Stated pharmacy advised her they sold them; however, Rx is needed.  Pt asked if Dr. Raliegh Ip could send in Rx for spacer for her inhaler.   [facility] [id number], [place] AT [facility] [address]  [place] [address]  Phone: [phone] Fax: [phone]  Hours: Not open 24 hours   Please advise.

## 2023-03-19 NOTE — Progress Notes (Signed)
Subjective:    Patient ID: Amanda Wang, female    DOB: July 22, 1951, 72 y.o.   MRN: [id number]  Amanda Wang is a 72 y.o. female presenting on 03/19/2023 for Cough and Nasal Congestion  Patient presents for a same day appointment.  HPI  Sinusitis, Congestion Recently seen by by Webb Silversmith, FNP here on 03/16/23, recommended allergy and OTC therapy. She has cough syrup at home No improvement and now worsening productive thicker drainage cough and congestion. Admits some episodic fever low grade 100 to 100.1 Admits wheezing productive cough Denies dyspnea, body aches nausea vomiting       02/23/2023    4:30 PM 02/07/2023    5:08 PM 01/29/2023    5:05 PM  Depression screen PHQ 2/9  Decreased Interest 1 1   Down, Depressed, Hopeless 0 1   PHQ - 2 Score 1 2   Altered sleeping 1 2   Tired, decreased energy 2 2   Change in appetite 2 2   Feeling bad or failure about yourself  0 1   Trouble concentrating 0 0   Moving slowly or fidgety/restless 0 0   Suicidal thoughts 0 0   PHQ-9 Score 6 9   Difficult doing work/chores Somewhat difficult Not difficult at all      Information is confidential and restricted. Go to Review Flowsheets to unlock data.    Social History   Tobacco Use   Smoking status: Former    Packs/day: 0.50    Years: 1.00    Additional pack years: 0.00    Total pack years: 0.50    Types: Cigarettes    Quit date: 07/26/1977    Years since quitting: 45.6    Passive exposure: Never   Smokeless tobacco: Never   Tobacco comments:    barley smoked when smoked   Vaping Use   Vaping Use: Never used  Substance Use Topics   Alcohol use: Yes    Alcohol/week: 0.0 standard drinks of alcohol    Comment: on occasion 1-2 x month   Drug use: No    Review of Systems Per HPI unless specifically indicated above     Objective:    BP 124/68   Pulse 81   Temp 100.1 F (37.8 C) (Oral)   Ht 5\' 2"  (1.575 m)   Wt 176 lb 0.2 oz (79.8 kg)   SpO2 98%   BMI 32.19  kg/m   Wt Readings from Last 3 Encounters:  03/19/23 176 lb 0.2 oz (79.8 kg)  03/16/23 176 lb (79.8 kg)  02/27/23 181 lb 3.2 oz (82.2 kg)    Physical Exam Vitals and nursing note reviewed.  Constitutional:      General: She is not in acute distress.    Appearance: Normal appearance. She is well-developed. She is not diaphoretic.     Comments: Well-appearing, comfortable, cooperative  HENT:     Head: Normocephalic and atraumatic.  Eyes:     General:        Right eye: No discharge.        Left eye: No discharge.     Conjunctiva/sclera: Conjunctivae normal.  Neck:     Thyroid: No thyromegaly.  Cardiovascular:     Rate and Rhythm: Normal rate and regular rhythm.     Heart sounds: Normal heart sounds. No murmur heard. Pulmonary:     Effort: Pulmonary effort is normal. No respiratory distress.     Breath sounds: Wheezing present. No rales.  Comments: cough Musculoskeletal:        General: Normal range of motion.     Cervical back: Normal range of motion and neck supple.  Lymphadenopathy:     Cervical: No cervical adenopathy.  Skin:    General: Skin is warm and dry.     Findings: No erythema or rash.  Neurological:     Mental Status: She is alert and oriented to person, place, and time.  Psychiatric:        Mood and Affect: Mood normal.        Behavior: Behavior normal.        Thought Content: Thought content normal.     Comments: Well groomed, good eye contact, normal speech and thoughts      Results for orders placed or performed in visit on 03/16/23  POC COVID-19  Result Value Ref Range   SARS Coronavirus 2 Ag Negative Negative  POCT Influenza A/B  Result Value Ref Range   Influenza A, POC Negative Negative   Influenza B, POC Negative Negative      Assessment & Plan:   Problem List Items Addressed This Visit   None Visit Diagnoses     Acute non-recurrent frontal sinusitis    -  Primary   Relevant Medications   amoxicillin-clavulanate (AUGMENTIN)  875-125 MG tablet   predniSONE (DELTASONE) 20 MG tablet   ipratropium (ATROVENT) 0.06 % nasal spray   Bronchospasm       Relevant Medications   predniSONE (DELTASONE) 20 MG tablet       Consistent with acute frontal subacute sinusitis, likely initially viral URI vs allergic rhinitis component with worsening concern for bacterial infection. Last seen 03/16/23 for sinusitis drainage cough Limited improvement on conservative therapy Low grade fever No sick contact Negative COVID Flu Never smoker (only 1 yr in past quit 40-50 yr ago)  Plan: 1. Start Augmentin 875-125mg  PO BID x 10 days 2. Start Prednisone taper 7 day for wheezing bronchospasm 3. Start Atrovent nasal spray decongestant 2 sprays in each nostril up to 4 times daily for 7 days 4. Use Albuterol AS NEEDED 5. Allergy medications 6. OTC cough cold medication as needed  Return criteria reviewed   Meds ordered this encounter  Medications   amoxicillin-clavulanate (AUGMENTIN) 875-125 MG tablet    Sig: Take 1 tablet by mouth 2 (two) times daily.    Dispense:  20 tablet    Refill:  0   predniSONE (DELTASONE) 20 MG tablet    Sig: Take daily with food. Start with 60mg  (3 pills) x 2 days, then reduce to 40mg  (2 pills) x 2 days, then 20mg  (1 pill) x 3 days    Dispense:  13 tablet    Refill:  0   ipratropium (ATROVENT) 0.06 % nasal spray    Sig: Place 2 sprays into both nostrils 4 (four) times daily. For up to 5-7 days then stop.    Dispense:  15 mL    Refill:  0      Follow up plan: Return if symptoms worsen or fail to improve.  Nobie Putnam, [facility] 03/19/2023, 10:13 AM

## 2023-03-20 ENCOUNTER — Inpatient Hospital Stay: Payer: Medicare PPO | Attending: Internal Medicine | Admitting: Internal Medicine

## 2023-03-20 DIAGNOSIS — R590 Localized enlarged lymph nodes: Secondary | ICD-10-CM | POA: Insufficient documentation

## 2023-03-20 DIAGNOSIS — Z9884 Bariatric surgery status: Secondary | ICD-10-CM | POA: Insufficient documentation

## 2023-03-20 DIAGNOSIS — D509 Iron deficiency anemia, unspecified: Secondary | ICD-10-CM | POA: Insufficient documentation

## 2023-03-21 ENCOUNTER — Ambulatory Visit: Payer: Medicare PPO | Admitting: Clinical

## 2023-03-23 ENCOUNTER — Inpatient Hospital Stay (HOSPITAL_BASED_OUTPATIENT_CLINIC_OR_DEPARTMENT_OTHER): Payer: Medicare PPO | Admitting: Internal Medicine

## 2023-03-23 ENCOUNTER — Encounter: Payer: Self-pay | Admitting: Internal Medicine

## 2023-03-23 ENCOUNTER — Inpatient Hospital Stay: Payer: Medicare PPO

## 2023-03-23 VITALS — BP 133/76 | HR 72 | Temp 97.4°F | Resp 17 | Wt 177.6 lb

## 2023-03-23 VITALS — BP 131/65 | HR 71

## 2023-03-23 DIAGNOSIS — Z903 Acquired absence of stomach [part of]: Secondary | ICD-10-CM | POA: Diagnosis not present

## 2023-03-23 DIAGNOSIS — D509 Iron deficiency anemia, unspecified: Secondary | ICD-10-CM | POA: Diagnosis not present

## 2023-03-23 DIAGNOSIS — R590 Localized enlarged lymph nodes: Secondary | ICD-10-CM | POA: Diagnosis not present

## 2023-03-23 DIAGNOSIS — D508 Other iron deficiency anemias: Secondary | ICD-10-CM

## 2023-03-23 DIAGNOSIS — Z9884 Bariatric surgery status: Secondary | ICD-10-CM | POA: Diagnosis not present

## 2023-03-23 MED ORDER — SODIUM CHLORIDE 0.9 % IV SOLN
200.0000 mg | Freq: Once | INTRAVENOUS | Status: AC
  Administered 2023-03-23: 200 mg via INTRAVENOUS
  Filled 2023-03-23: qty 200

## 2023-03-23 MED ORDER — SODIUM CHLORIDE 0.9 % IV SOLN
Freq: Once | INTRAVENOUS | Status: AC
  Filled 2023-03-23: qty 250

## 2023-03-23 NOTE — Addendum Note (Signed)
Addended byMichaelyn Barter on: 03/23/2023 03:28 PM   Modules accepted: Orders

## 2023-03-23 NOTE — Progress Notes (Signed)
[facility]  Telephone:(336) [phone] Fax:(336) [phone]  ID: Amanda Wang [place] OB: 06/13/1951  MR#: [id number]  AOZ#:[id number]#:[id number]  Patient Care Team: Smitty CordsKaramalegos, Alexander J, DO as PCP - General (Family Medicine) Midge MiniumWohl, Darren, MD as Consulting Physician (Gastroenterology)   HPI: Amanda Wang Amanda Wang is a 72 y.o. female with past medical history of hypertension, hyperlipidemia, GERD, chiari malformation type I, arthritis, sleep apnea was referred to hematology for management of iron deficiency anemia  Patient was seen by primary on February 23, 2023 for symptomatic anemia.  She feels very tired.  CBC showed hemoglobin 8.5, MCV 73.5.  Iron panel showed saturation 9%.  Ferritin 6.  B12 level 561.  Patient has history of gastric bypass surgery.  She has chronic history of iron deficiency anemia.  Colonoscopy performed for the same reason by Dr. Tobi BastosAnna in November 2022 was normal.  Endoscopy showed multiple 5 to 10 mm sessile polyp without bleeding in the stomach.  Biopsy showed hyperplastic polyps.  She also reported palpable neck lymph nodes about 3 weeks ago which are tender.  Also reports puffiness around her neck.  Denies any recent URI.  Denies any recent weight loss. has noticed decline in her appetite.  Occasional night sweats.  Interval history- Patient was seen today as follow-up to discuss ultrasound of the neck. She denies any more palpable lymph nodes.  Recently she is having issues with breathing likely exacerbation from 4 months.  She was started on prednisone and her breathing is improved.  REVIEW OF SYSTEMS:   ROS  As per HPI. Otherwise, a complete review of systems is negative.  PAST MEDICAL HISTORY: Past Medical History:  Diagnosis Date   Advanced care planning/counseling discussion 09/16/2018   Arthritis    Chiari malformation type I    Depression    GERD (gastroesophageal reflux disease)    History of hiatal hernia    Hyperlipidemia    Hypertension     PONV (postoperative nausea and vomiting)    Sleep apnea    Small intestinal bacterial overgrowth    Spinal stenosis    Vaccine reaction, initial encounter 03/11/2020    PAST SURGICAL HISTORY: Past Surgical History:  Procedure Laterality Date   BREAST BIOPSY Right    neg   chiari malformation I decompression surgery  11-06   COLONOSCOPY WITH PROPOFOL N/A 11/28/2018   Procedure: COLONOSCOPY WITH PROPOFOL;  Surgeon: Wyline MoodAnna, Kiran, MD;  [facility];  Service: Gastroenterology;  Laterality: N/A;   COLONOSCOPY WITH PROPOFOL N/A 10/27/2021   Procedure: COLONOSCOPY WITH PROPOFOL;  Surgeon: Wyline MoodAnna, Kiran, MD;  [facility];  Service: Gastroenterology;  Laterality: N/A;   deviated septum repair     ESOPHAGOGASTRODUODENOSCOPY N/A 10/27/2021   Procedure: ESOPHAGOGASTRODUODENOSCOPY (EGD);  Surgeon: Wyline MoodAnna, Kiran, MD;  [facility];  Service: Gastroenterology;  Laterality: N/A;   HERNIA REPAIR     KNEE ARTHROSCOPY WITH MEDIAL MENISECTOMY Left 01/12/2022   Procedure: KNEE ARTHROSCOPY WITH MEDIAL MENISECTOMY;  Surgeon: Juanell FairlyKrasinski, Kevin, MD;  Location: [facility] ORS;  Service: Orthopedics;  Laterality: Left;    FAMILY HISTORY: Family History  Problem Relation Age of Onset   Hypertension Mother    Cancer Mother    Stroke Mother    Heart disease Father    Breast cancer Sister [id number]   Breast cancer Paternal Aunt    Diabetes Paternal Grandmother     HEALTH MAINTENANCE: Social History   Tobacco Use   Smoking status: Former    Packs/day: 0.50    Years: 1.00  Additional pack years: 0.00    Total pack years: 0.50    Types: Cigarettes    Quit date: 07/26/1977    Years since quitting: 45.6    Passive exposure: Never   Smokeless tobacco: Never   Tobacco comments:    barley smoked when smoked   Vaping Use   Vaping Use: Never used  Substance Use Topics   Alcohol use: Yes    Alcohol/week: 0.0 standard drinks of alcohol    Comment: on occasion 1-2 x month   Drug use:  No     Allergies  Allergen Reactions   Fexofenadine Anxiety    Makes her have a panic attack   Codeine Nausea Only    Dizziness   Methylphenidate Hcl     "Caused a seizure"   Zoloft [Sertraline] Other (See Comments)    Sleepiness   Magnevist [Gadopentetate] Hives and Itching    IV Contrast   Sulfa Antibiotics Rash    Current Outpatient Medications  Medication Sig Dispense Refill   Accu-Chek Softclix Lancets lancets TEST TWO TO THREE TIMES DAILY 100 each 2   acyclovir (ZOVIRAX) 200 MG capsule Take 200 mg by mouth 5 (five) times daily as needed (fever blisters).     albuterol (VENTOLIN HFA) 108 (90 Base) MCG/ACT inhaler INHALE 2 INHALATIONS INTO THE LUNGS EVERY 4 HOURS AS NEEDED     amoxicillin-clavulanate (AUGMENTIN) 875-125 MG tablet Take 1 tablet by mouth 2 (two) times daily. 20 tablet 0   atorvastatin (LIPITOR) 40 MG tablet Take 1 tablet (40 mg total) by mouth at bedtime. 90 tablet 3   busPIRone (BUSPAR) 5 MG tablet Take 1 tablet (5 mg total) by mouth 2 (two) times daily. 60 tablet 0   Carboxymethylcellul-Glycerin (LUBRICATING EYE DROPS OP) Place 1 drop into both eyes daily as needed (dry eyes).     carvedilol (COREG) 6.25 MG tablet Take 1 tablet (6.25 mg total) by mouth 2 (two) times daily with a meal. 180 tablet 3   Charcoal Activated (ACTIVATED CHARCOAL PO) Take 2 capsules by mouth as needed (gas).     Cholecalciferol 125 MCG (5000 UT) TABS      cyclobenzaprine (FLEXERIL) 10 MG tablet take 1 tablet by mouth three times a day if needed for muscle spasm 90 tablet 3   gabapentin (NEURONTIN) 600 MG tablet Take 1 tablet (600 mg total) by mouth at bedtime. 90 tablet 3   ipratropium (ATROVENT) 0.06 % nasal spray Place 2 sprays into both nostrils 4 (four) times daily. For up to 5-7 days then stop. 15 mL 0   levETIRAcetam (KEPPRA) 500 MG tablet Take 1 tablet (500 mg total) by mouth 2 (two) times daily. 180 tablet 3   Lidocaine-Menthol (ICY HOT MAX LIDOCAINE EX) Apply 1 application.  topically daily as needed (pain).     LORazepam (ATIVAN) 1 MG tablet Take 0.5-1 tablets (0.5-1 mg total) by mouth daily as needed for anxiety. 30 tablet 2   metFORMIN (GLUCOPHAGE) 500 MG tablet Take 2 tablets (1,000 mg total) by mouth 2 (two) times daily with a meal. 120 tablet 0   predniSONE (DELTASONE) 20 MG tablet Take daily with food. Start with 60mg  (3 pills) x 2 days, then reduce to 40mg  (2 pills) x 2 days, then 20mg  (1 pill) x 3 days 13 tablet 0   Spacer/Aero-Holding Chambers (AEROCHAMBER PLUS WITH MASK) inhaler Use with inhaler as needed. 1 each 0   valsartan (DIOVAN) 320 MG tablet Take 1 tablet (320 mg total) by mouth daily.  90 tablet 3   Vilazodone HCl (VIIBRYD) 40 MG TABS Take 1 tablet (40 mg total) by mouth daily. 90 tablet 0   celecoxib (CELEBREX) 200 MG capsule Take 200 mg by mouth daily. Per Orthopedic (Patient not taking: Reported on 03/23/2023)     omeprazole (PRILOSEC) 40 MG capsule Take 1 capsule (40 mg total) by mouth daily before breakfast. (Patient not taking: Reported on 03/23/2023) 90 capsule 3   No current facility-administered medications for this visit.    OBJECTIVE: Vitals:   03/23/23 1450  BP: 133/76  Pulse: 72  Resp: 17  Temp: (!) 97.4 F (36.3 C)  SpO2: 93%     Body mass index is 32.48 kg/m.      General: Well-developed, well-nourished, no acute distress. Eyes: Pink conjunctiva, anicteric sclera. HEENT: Normocephalic, moist mucous membranes, clear oropharnyx. Lungs: Clear to auscultation bilaterally. Heart: Regular rate and rhythm. No rubs, murmurs, or gallops. Abdomen: Soft, nontender, nondistended. No organomegaly noted, normoactive bowel sounds. Musculoskeletal: No edema, cyanosis, or clubbing. Neuro: Alert, answering all questions appropriately. Cranial nerves grossly intact. Skin: No rashes or petechiae noted. Psych: Normal affect. Lymphatics: No palpable lymphadenopathy in the neck.   LAB RESULTS:  Lab Results  Component Value Date   NA 142  02/20/2023   K 4.3 02/20/2023   CL 107 02/20/2023   CO2 26 02/20/2023   GLUCOSE 126 (H) 02/20/2023   BUN 14 02/20/2023   CREATININE 0.67 02/20/2023   CALCIUM 8.4 (L) 02/20/2023   PROT 6.8 02/20/2023   ALBUMIN 4.7 10/06/2021   AST 13 02/20/2023   ALT 9 02/20/2023   ALKPHOS 134 (H) 10/06/2021   BILITOT 0.4 02/20/2023   GFRNONAA >60 01/04/2022   GFRAA 96 01/11/2021    Lab Results  Component Value Date   WBC 6.5 02/20/2023   NEUTROABS 4,661 02/20/2023   HGB 8.5 (L) 02/20/2023   HCT 29.7 (L) 02/20/2023   MCV 73.5 (L) 02/20/2023   PLT 440 (H) 02/20/2023    Lab Results  Component Value Date   TIBC 342 02/20/2023   TIBC 298 12/30/2021   TIBC 323 10/07/2021   FERRITIN 6 (L) 02/20/2023   FERRITIN 27 12/30/2021   FERRITIN 17 10/07/2021   IRONPCTSAT 9 (L) 02/20/2023   IRONPCTSAT 14 (L) 12/30/2021   IRONPCTSAT 7 (LL) 10/07/2021     STUDIES: US Soft Tissue Head/Neck  Result Date: 03/08/2023 CLINICAL DATA:  Neck swelling EXAM: ULTRASOUND OF HEAD/NECK SOFT TISSUES TECHNIQUE: Ultrasound examination of the head and neck soft tissues was performed in the area of clinical concern. COMPARISON:  None Available. FINDINGS: Sonographic interrogation bilateral neck demonstrates sonographically unremarkable appearing lymph nodes along the cervical chains. On the right, the largest node measures only 0.4 cm in short axis. On the left the largest node measures only 0.3 cm in short axis. The fatty hila are preserved. No architectural distortion. No pathologic features. IMPRESSION: Essentially negative sonographic survey of the neck. No suspicious lymphadenopathy. Electronically Signed   By: Malachy Moan M.D.   On: 03/08/2023 09:17    ASSESSMENT AND PLAN:   Amanda Wang is a 73 y.o. female with pmh of hypertension, hyperlipidemia, GERD, chiari malformation type I, arthritis, sleep apnea was referred to hematology for management of iron deficiency anemia  # Iron deficiency anemia #  History of gastric bypass surgery # Symptomatic anemia -Progressive.  Likely malabsorption from gastric bypass surgery. Colonoscopy performed by Dr. Tobi Bastos in November 2022 was normal.  Endoscopy showed multiple 5 to 10 mm sessile  polyp without bleeding in the stomach.  Biopsy showed hyperplastic polyps.  -Recent labs showed ferritin of 6.  Continue with IV Venofer.  Today is #4.  I will repeat labs in 4 months. -Advised to take vitamin B12 1000 mcg daily.  # Cervical lymphadenopathy -Resolved.  Most likely benign.  Ultrasound neck was normal.  No further workup indicated at this time.  No orders of the defined types were placed in this encounter.  RTC in 4 months for MD visit, labs, possible Venofer.   Patient expressed understanding and was in agreement with this plan. She also understands that She can call clinic at any time with any questions, concerns, or complaints.   I spent a total of 25 minutes reviewing chart data, face-to-face evaluation with the patient, counseling and coordination of care as detailed above.  Michaelyn Barter, MD   03/23/2023 3:06 PM

## 2023-03-26 ENCOUNTER — Other Ambulatory Visit: Payer: Self-pay | Admitting: Family Medicine

## 2023-03-26 DIAGNOSIS — J0110 Acute frontal sinusitis, unspecified: Secondary | ICD-10-CM

## 2023-03-27 NOTE — Telephone Encounter (Signed)
Requested medication (s) are due for refill today: No  Requested medication (s) are on the active medication list: Yes  Last refill:  03/19/23  Future visit scheduled: No  Notes to clinic:  See request    Requested Prescriptions  Pending Prescriptions Disp Refills   ipratropium (ATROVENT) 0.06 % nasal spray [Pharmacy Med Name: IPRATROPIUM 0.06% NAS SP (165)] 15 mL 0    Sig: USE 2 SPRAYS IN EACH NOSTRIL FOUR TIMES DAILY FOR UP TO 5 TO 7 DAYS THEN STOP     Off-Protocol Failed - 03/26/2023  3:10 AM      Failed - Medication not assigned to a protocol, review manually.      Passed - Valid encounter within last 12 months    Recent Outpatient Visits           1 week ago Acute non-recurrent frontal sinusitis   [place] [facility] [place], Netta Neat, DO   1 week ago Acute cough   [place] [facility] [place], [place] W, NP   1 month ago Type 2 diabetes mellitus with other specified complication, without long-term current use of insulin [facility])   [place] [facility] Smitty Cords, DO   1 month ago Anterior cervical lymphadenopathy   [place] [facility] Smitty Cords, DO   1 month ago Essential hypertension   [place] [facility] Smitty Cords, DO       Future Appointments             In 4 months [place] Charon, Netta Neat, DO [place] [facility]           Off-Protocol Failed - 03/26/2023  3:10 AM      Failed - Medication not assigned to a protocol, review manually.      Passed - Valid encounter within last 12 months    Recent Outpatient Visits           1 week ago Acute non-recurrent frontal sinusitis   [place] [facility], Netta Neat, DO   1 week ago Acute cough   [place] [facility] [place], [place] W, NP   1 month ago Type 2 diabetes mellitus with other specified  complication, without long-term current use of insulin [facility])   [place] [facility] Smitty Cords, DO   1 month ago Anterior cervical lymphadenopathy   [place] [facility] Smitty Cords, DO   1 month ago Essential hypertension   [place] [facility] Smitty Cords, DO       Future Appointments             In 4 months Althea Charon, Netta Neat, DO [place] [facility], [facility]            2
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
## 2023-03-29 ENCOUNTER — Encounter: Payer: Self-pay | Admitting: Psychiatry

## 2023-03-29 ENCOUNTER — Telehealth (INDEPENDENT_AMBULATORY_CARE_PROVIDER_SITE_OTHER): Payer: Medicare PPO | Admitting: Psychiatry

## 2023-03-29 ENCOUNTER — Encounter: Payer: Self-pay | Admitting: Internal Medicine

## 2023-03-29 DIAGNOSIS — F411 Generalized anxiety disorder: Secondary | ICD-10-CM

## 2023-03-29 DIAGNOSIS — F410 Panic disorder [episodic paroxysmal anxiety] without agoraphobia: Secondary | ICD-10-CM

## 2023-03-29 DIAGNOSIS — F3341 Major depressive disorder, recurrent, in partial remission: Secondary | ICD-10-CM | POA: Diagnosis not present

## 2023-03-29 DIAGNOSIS — F4310 Post-traumatic stress disorder, unspecified: Secondary | ICD-10-CM

## 2023-03-29 MED ORDER — VILAZODONE HCL 40 MG PO TABS: 40.0000 mg | ORAL_TABLET | Freq: Every day | ORAL | 0 refills | Status: DC

## 2023-03-29 MED ORDER — BUSPIRONE HCL 5 MG PO TABS
5.0000 mg | ORAL_TABLET | Freq: Two times a day (BID) | ORAL | 0 refills | Status: AC
Start: 2023-03-29 — End: 2023-06-27

## 2023-03-30 ENCOUNTER — Other Ambulatory Visit: Payer: Self-pay | Admitting: Family Medicine

## 2023-03-30 ENCOUNTER — Inpatient Hospital Stay: Payer: Medicare PPO

## 2023-03-30 VITALS — BP 166/76 | HR 67 | Temp 98.7°F | Resp 16

## 2023-03-30 DIAGNOSIS — Z9884 Bariatric surgery status: Secondary | ICD-10-CM | POA: Diagnosis not present

## 2023-03-30 DIAGNOSIS — D509 Iron deficiency anemia, unspecified: Secondary | ICD-10-CM | POA: Diagnosis not present

## 2023-03-30 DIAGNOSIS — E1169 Type 2 diabetes mellitus with other specified complication: Secondary | ICD-10-CM

## 2023-03-30 DIAGNOSIS — R590 Localized enlarged lymph nodes: Secondary | ICD-10-CM | POA: Diagnosis not present

## 2023-03-30 DIAGNOSIS — Z903 Acquired absence of stomach [part of]: Secondary | ICD-10-CM

## 2023-03-30 MED ORDER — SODIUM CHLORIDE 0.9 % IV SOLN
200.0000 mg | Freq: Once | INTRAVENOUS | Status: AC
  Administered 2023-03-30: 200 mg via INTRAVENOUS
  Filled 2023-03-30: qty 200

## 2023-03-30 MED ORDER — SODIUM CHLORIDE 0.9 % IV SOLN
Freq: Once | INTRAVENOUS | Status: AC
  Filled 2023-03-30: qty 250

## 2023-03-30 NOTE — Telephone Encounter (Signed)
Requested Prescriptions  Pending Prescriptions Disp Refills   metFORMIN (GLUCOPHAGE) 500 MG tablet [Pharmacy Med Name: METFORMIN  TABLETS] 360 tablet 1    Sig: TAKE 2 TABLETS(1000 MG) BY MOUTH TWICE DAILY WITH A MEAL     Endocrinology:  Diabetes - Biguanides Passed - 03/30/2023  3:10 AM      Passed - Cr in normal range and within 360 days    Creatinine  Date Value Ref Range Status  01/11/2021 54.2 20.0 - 300.0 mg/dL Final   Creat  Date Value Ref Range Status  02/20/2023 0.67 0.60 - 1.00 mg/dL Final         Passed - HBA1C is between 0 and 7.9 and within 180 days    Hemoglobin A1C  Date Value Ref Range Status  07/24/2012 6.4 (H) 4.2 - 6.3 % Final    Comment:    The American Diabetes Association recommends that a primary goal of therapy should be <7% and that physicians should reevaluate the treatment regimen in patients with HbA1c values consistently >8%.    HB A1C (BAYER DCA - WAIVED)  Date Value Ref Range Status  04/25/2022 5.8 (H) 4.8 - 5.6 % Final    Comment:             Prediabetes: 5.7 - 6.4          Diabetes: >6.4          Glycemic control for adults with diabetes: <7.0    Hgb A1c MFr Bld  Date Value Ref Range Status  02/20/2023 7.3 (H) <5.7 % of total Hgb Final    Comment:    For someone without known diabetes, a hemoglobin A1c value of 6.5% or greater indicates that they may have  diabetes and this should be confirmed with a follow-up  test. . For someone with known diabetes, a value <7% indicates  that their diabetes is well controlled and a value  greater than or equal to 7% indicates suboptimal  control. A1c targets should be individualized based on  duration of diabetes, age, comorbid conditions, and  other considerations. . Currently, no consensus exists regarding use of hemoglobin A1c for diagnosis of diabetes for children. .          Passed - eGFR in normal range and within 360 days    EGFR (African American)  Date Value Ref Range Status   07/24/2012 >60  Final   GFR calc Af Amer  Date Value Ref Range Status  01/11/2021 96 >59 mL/min/1.73 Final    Comment:    **In accordance with recommendations from the NKF-ASN Task force,**   [facility] is in the process of updating its eGFR calculation to the   2021 CKD-EPI creatinine equation that estimates kidney function   without a race variable.    EGFR (Non-African Amer.)  Date Value Ref Range Status  07/24/2012 >60  Final    Comment:    eGFR values <78mL/min/1.73 m2 may be an indication of chronic kidney disease (CKD). Calculated eGFR is useful in patients with stable renal function. The eGFR calculation will not be reliable in acutely ill patients when serum creatinine is changing rapidly. It is not useful in  patients on dialysis. The eGFR calculation may not be applicable to patients at the low and high extremes of body sizes, pregnant women, and vegetarians.    GFR, Estimated  Date Value Ref Range Status  01/04/2022 >60 >60 mL/min Final    Comment:    (NOTE) Calculated using the  CKD-EPI Creatinine Equation (2021)    eGFR  Date Value Ref Range Status  02/20/2023 94 > OR = 60 mL/min/1.50m2 Final  04/25/2022 79 >59 mL/min/1.73 Final         Passed - B12 Level in normal range and within 720 days    Vitamin B-12  Date Value Ref Range Status  02/20/2023 561 200 - 1,100 pg/mL Final         Passed - Valid encounter within last 6 months    Recent Outpatient Visits           1 week ago Acute non-recurrent frontal sinusitis   [place] [facility] [place], Netta Neat, DO   2 weeks ago Acute cough   [place] [facility] [place], [place] W, NP   1 month ago Type 2 diabetes mellitus with other specified complication, without long-term current use of insulin (HCC)   [place] [facility] [place] [place], Netta Neat, DO   1 month ago Anterior cervical lymphadenopathy   [place] [facility]  [place], Netta Neat, DO   1 month ago Essential hypertension   [place] [facility] [place], Netta Neat, DO       Future Appointments             In 4 months Althea Charon, Netta Neat, DO  [facility]            Passed - CBC within normal limits and completed in the last 12 months    WBC  Date Value Ref Range Status  02/20/2023 6.5 3.8 - 10.8 Thousand/uL Final   RBC  Date Value Ref Range Status  02/20/2023 4.04 3.80 - 5.10 Million/uL Final   Hemoglobin  Date Value Ref Range Status  02/20/2023 8.5 (L) 11.7 - 15.5 g/dL Final  42/35/3614 43.1 11.1 - 15.9 g/dL Final   HCT  Date Value Ref Range Status  02/20/2023 29.7 (L) 35.0 - 45.0 % Final   Hematocrit  Date Value Ref Range Status  12/30/2021 38.0 34.0 - 46.6 % Final   MCHC  Date Value Ref Range Status  02/20/2023 28.6 (L) 32.0 - 36.0 g/dL Final   [facility]  Date Value Ref Range Status  02/20/2023 21.0 (L) 27.0 - 33.0 pg Final   MCV  Date Value Ref Range Status  02/20/2023 73.5 (L) 80.0 - 100.0 fL Final  12/30/2021 79 79 - 97 fL Final  07/24/2012 86 80 - 100 fL Final   No results found for: "PLTCOUNTKUC", "LABPLAT", "POCPLA" RDW  Date Value Ref Range Status  02/20/2023 17.7 (H) 11.0 - 15.0 % Final  12/30/2021 16.8 (H) 11.7 - 15.4 % Final  07/24/2012 14.6 (H) 11.5 - 14.5 % Final

## 2023-04-25 ENCOUNTER — Telehealth: Payer: Self-pay | Admitting: Family Medicine

## 2023-04-25 NOTE — Telephone Encounter (Signed)
Called patient to schedule Medicare Annual Wellness Visit (AWV). Left message for patient to call back and schedule Medicare Annual Wellness Visit (AWV).  Last date of AWV: 10/04/21  Please schedule an appointment at any time with Kennedy Bucker, LPN  .  If any questions, please contact me .  Thank you ,  Verlee Rossetti; Care Guide Ambulatory Clinical Support [place] [facility] Direct Dial: [phone]

## 2023-05-07 LAB — HM DIABETES EYE EXAM

## 2023-05-17 ENCOUNTER — Encounter: Payer: Self-pay | Admitting: Family Medicine

## 2023-05-17 DIAGNOSIS — E1169 Type 2 diabetes mellitus with other specified complication: Secondary | ICD-10-CM

## 2023-05-17 MED ORDER — ACCU-CHEK SOFTCLIX LANCETS MISC
2 refills | Status: DC
Start: 2023-05-17 — End: 2024-07-18

## 2023-05-17 MED ORDER — ACCU-CHEK GUIDE W/DEVICE KIT
1.0000 | PACK | Freq: Four times a day (QID) | 0 refills | Status: DC
Start: 2023-05-17 — End: 2024-07-18

## 2023-05-18 ENCOUNTER — Telehealth: Payer: Self-pay | Admitting: Family Medicine

## 2023-05-18 ENCOUNTER — Other Ambulatory Visit: Payer: Self-pay | Admitting: Family Medicine

## 2023-05-18 DIAGNOSIS — E1169 Type 2 diabetes mellitus with other specified complication: Secondary | ICD-10-CM

## 2023-05-18 NOTE — Telephone Encounter (Signed)
Medication Refill - Medication: Medication Blood Glucose Monitoring Suppl (ACCU-CHEK GUIDE) TEST STRIPS  Has the patient contacted their pharmacy? Yes.   (Agent: If no, request that the patient contact the pharmacy for the refill. If patient does not wish to contact the pharmacy document the reason why and proceed with request.) (Agent: If yes, when and what did the pharmacy advise?)  Preferred Pharmacy (with phone number or street name):  [id number] [place], [place] - [address] AT [facility] OF [address] [place]  [address] [place] [address]  Phone: [phone] Fax: [phone]  Hours: Not open 24 hours   Has the patient been seen for an appointment in the last year OR does the patient have an upcoming appointment? Yes.    Agent: Please be advised that RX refills may take up to 3 business days. We ask that you follow-up with your pharmacy.

## 2023-05-18 NOTE — Telephone Encounter (Signed)
Requested medications are due for refill today.  unsure  Requested medications are on the active medications list.  no  Last refill. unknown  Future visit scheduled.   yes  Notes to clinic.  Rx for new monitor was written yesterday, unsure if new machine comes with a supply of test strips. Please advise.    Requested Prescriptions  Pending Prescriptions Disp Refills   ACCU-CHEK GUIDE test strip [Pharmacy Med Name: ACCU-CHEK GUIDE TEST STRIPS 100S] 100 strip     Sig: TEST TWO TO THREE TIMES DAILY     Endocrinology: Diabetes - Testing Supplies Passed - 05/18/2023  4:11 PM      Passed - Valid encounter within last 12 months    Recent Outpatient Visits           2 months ago Acute non-recurrent frontal sinusitis   [place] [facility] Smitty Cords, DO   2 months ago Acute cough   [place] [facility] [place], [place], NP   2 months ago Type 2 diabetes mellitus with other specified complication, without long-term current use of insulin [facility])   [place] [facility] Smitty Cords, DO   3 months ago Anterior cervical lymphadenopathy   [place] [facility] Smitty Cords, DO   3 months ago Essential hypertension   [place] Smitty Cords, DO       Future Appointments             In 2 months Althea Charon, Netta Neat, DO [place] [facility]

## 2023-05-21 MED ORDER — ACCU-CHEK GUIDE VI STRP
ORAL_STRIP | 3 refills | Status: DC
Start: 2023-05-21 — End: 2024-07-18

## 2023-05-21 NOTE — Telephone Encounter (Signed)
Yes I have signed order for Accuchek Guide Test strip  Saralyn Pilar, DO [facility] 05/21/2023, 3:57 PM

## 2023-05-22 ENCOUNTER — Other Ambulatory Visit: Payer: Self-pay | Admitting: Orthopedic Surgery

## 2023-05-22 DIAGNOSIS — Z01818 Encounter for other preprocedural examination: Secondary | ICD-10-CM

## 2023-05-22 MED ORDER — TRANEXAMIC ACID 1000 MG/10ML IV SOLN: 1000.0000 mg | INTRAVENOUS | Status: AC

## 2023-05-23 ENCOUNTER — Inpatient Hospital Stay
Admission: RE | Admit: 2023-05-23 | Discharge: 2023-05-23 | Disposition: A | Discharge status: Home / Self Care | Payer: Medicare PPO | Source: Ambulatory Visit

## 2023-05-23 ENCOUNTER — Telehealth: Payer: Medicare PPO | Admitting: Psychiatry

## 2023-05-23 NOTE — Patient Instructions (Signed)
Your procedure is scheduled on: Tuesday June 05, 2023. Report to the Registration Desk on the 1st floor of the [facility]. To find out your arrival time, please call [phone] between 1PM - 3PM on: Monday June 04, 2023. If your arrival time is 6:00 am, do not arrive before that time as the [facility] entrance doors do not open until 6:00 am.  REMEMBER: Instructions that are not followed completely may result in serious medical risk, up to and including death; or upon the discretion of your surgeon and anesthesiologist your surgery may need to be rescheduled.  Do not eat food or drink fluids after midnight the night before surgery.  No gum chewing or hard candies.  One week prior to surgery: Stop Anti-inflammatories (NSAIDS) such as Advil, Aleve, Ibuprofen, Motrin, Naproxen, Naprosyn and Aspirin based products such as Excedrin, Goody's Powder, BC Powder. Stop ANY OVER THE COUNTER supplements until after surgery. You may however, continue to take Tylenol if needed for pain up until the day of surgery.  Continue taking all prescribed medications with the exception of the following: Stop metFORMIN (GLUCOPHAGE) 500 MG  2 days prior to surgery (take last dose 06/02/23)   Follow recommendations from Cardiologist or PCP regarding stopping blood thinners.  TAKE ONLY THESE MEDICATIONS THE MORNING OF SURGERY WITH A SIP OF WATER:  busPIRone (BUSPAR) 5 MG  carvedilol (COREG) 6.25 MG  levETIRAcetam (KEPPRA) 500 MG  LORazepam (ATIVAN) 1 MG  omeprazole (PRILOSEC) 40 MG Antacid (take one the night before and one on the morning of surgery - helps to prevent nausea after surgery.) Vilazodone HCl (VIIBRYD) 40 MG   Use inhalers on the day of surgery and bring to the hospital. albuterol (VENTOLIN HFA) 108 (90 Base) MCG/ACT inhaler   No Alcohol for 24 hours before or after surgery.  No Smoking including e-cigarettes for 24 hours before surgery.  No chewable tobacco products for at least 6 hours  before surgery.  No nicotine patches on the day of surgery.  Do not use any "recreational" drugs for at least a week (preferably 2 weeks) before your surgery.  Please be advised that the combination of cocaine and anesthesia may have negative outcomes, up to and including death. If you test positive for cocaine, your surgery will be cancelled.  On the morning of surgery brush your teeth with toothpaste and water, you may rinse your mouth with mouthwash if you wish. Do not swallow any toothpaste or mouthwash.  Use CHG Soap or wipes as directed on instruction sheet.  Do not wear jewelry, make-up, hairpins, clips or nail polish.  Do not wear lotions, powders, or perfumes.   Do not shave body hair from the neck down 48 hours before surgery.  Contact lenses, hearing aids and dentures may not be worn into surgery.  Do not bring valuables to the hospital. [facility] is not responsible for any missing/lost belongings or valuables.   Bring your C-PAP to the hospital in case you may have to spend the night.   Notify your doctor if there is any change in your medical condition (cold, fever, infection).  Wear comfortable clothing (specific to your surgery type) to the hospital.  After surgery, you can help prevent lung complications by doing breathing exercises.  Take deep breaths and cough every 1-2 hours. Your doctor may order a device called an Incentive Spirometer to help you take deep breaths. When coughing or sneezing, hold a pillow firmly against your incision with both hands. This is  called "splinting." Doing this helps protect your incision. It also decreases belly discomfort.  If you are being admitted to the hospital overnight, leave your suitcase in the car. After surgery it may be brought to your room.  In case of increased patient census, it may be necessary for you, the patient, to continue your postoperative care in the Same Day Surgery department.  If you are being  discharged the day of surgery, you will not be allowed to drive home. You will need a responsible individual to drive you home and stay with you for 24 hours after surgery.   If you are taking public transportation, you will need to have a responsible individual with you.  Please call the Pre-admissions Testing Dept. at [phone] if you have any questions about these instructions.  Surgery Visitation Policy:  Patients having surgery or a procedure may have two visitors.  Children under the age of 52 must have an adult with them who is not the patient.  Inpatient Visitation:    Visiting hours are 7 a.m. to 8 p.m. Up to four visitors are allowed at one time in a patient room. The visitors may rotate out with other people during the day.  One visitor age 69 or older may stay with the patient overnight and must be in the room by 8 p.m.  Pre-operative 5 CHG Bath Instructions   You can play a key role in reducing the risk of infection after surgery. Your skin needs to be as free of germs as possible. You can reduce the number of germs on your skin by washing with CHG (chlorhexidine gluconate) soap before surgery. CHG is an antiseptic soap that kills germs and continues to kill germs even after washing.   DO NOT use if you have an allergy to chlorhexidine/CHG or antibacterial soaps. If your skin becomes reddened or irritated, stop using the CHG and notify one of our RNs at [phone].   Please shower with the CHG soap starting 4 days before surgery using the following schedule:     Please keep in mind the following:  DO NOT shave, including legs and underarms, starting the day of your first shower.   You may shave your face at any point before/day of surgery.  Place clean sheets on your bed the day you start using CHG soap. Use a clean washcloth (not used since being washed) for each shower. DO NOT sleep with pets once you start using the CHG.   CHG Shower Instructions:  If you  choose to wash your hair and private area, wash first with your normal shampoo/soap.  After you use shampoo/soap, rinse your hair and body thoroughly to remove shampoo/soap residue.  Turn the water OFF and apply about 3 tablespoons (45 ml) of CHG soap to a CLEAN washcloth.  Apply CHG soap ONLY FROM YOUR NECK DOWN TO YOUR TOES (washing for 3-5 minutes)  DO NOT use CHG soap on face, private areas, open wounds, or sores.  Pay special attention to the area where your surgery is being performed.  If you are having back surgery, having someone wash your back for you may be helpful. Wait 2 minutes after CHG soap is applied, then you may rinse off the CHG soap.  Pat dry with a clean towel  Put on clean clothes/pajamas   If you choose to wear lotion, please use ONLY the CHG-compatible lotions on the back of this paper.     Additional instructions for the day of  surgery: DO NOT APPLY any lotions, deodorants, cologne, or perfumes.   Put on clean/comfortable clothes.  Brush your teeth.  Ask your nurse before applying any prescription medications to the skin.      CHG Compatible Lotions   Aveeno Moisturizing lotion  Cetaphil Moisturizing Cream  Cetaphil Moisturizing Lotion  Clairol Herbal Essence Moisturizing Lotion, Dry Skin  Clairol Herbal Essence Moisturizing Lotion, Extra Dry Skin  Clairol Herbal Essence Moisturizing Lotion, Normal Skin  Curel Age Defying Therapeutic Moisturizing Lotion with Alpha Hydroxy  Curel Extreme Care Body Lotion  Curel Soothing Hands Moisturizing Hand Lotion  Curel Therapeutic Moisturizing Cream, Fragrance-Free  Curel Therapeutic Moisturizing Lotion, Fragrance-Free  Curel Therapeutic Moisturizing Lotion, Original Formula  Eucerin Daily Replenishing Lotion  Eucerin Dry Skin Therapy Plus Alpha Hydroxy Crme  Eucerin Dry Skin Therapy Plus Alpha Hydroxy Lotion  Eucerin Original Crme  Eucerin Original Lotion  Eucerin Plus Crme Eucerin Plus Lotion  Eucerin  TriLipid Replenishing Lotion  Keri Anti-Bacterial Hand Lotion  Keri Deep Conditioning Original Lotion Dry Skin Formula Softly Scented  Keri Deep Conditioning Original Lotion, Fragrance Free Sensitive Skin Formula  Keri Lotion Fast Absorbing Fragrance Free Sensitive Skin Formula  Keri Lotion Fast Absorbing Softly Scented Dry Skin Formula  Keri Original Lotion  Keri Skin Renewal Lotion Keri Silky Smooth Lotion  Keri Silky Smooth Sensitive Skin Lotion  Nivea Body Creamy Conditioning Oil  Nivea Body Extra Enriched Lotion  Nivea Body Original Lotion  Nivea Body Sheer Moisturizing Lotion Nivea Crme  Nivea Skin Firming Lotion  NutraDerm 30 Skin Lotion  NutraDerm Skin Lotion  NutraDerm Therapeutic Skin Cream  NutraDerm Therapeutic Skin Lotion  ProShield Protective Hand Cream  Provon moisturizing lotion  Please go to the following website to access important education materials concerning your upcoming joint replacement.                                   [email or web address]

## 2023-05-23 NOTE — Pre-Procedure Instructions (Signed)
Patient did not show for in-person appointment at [facility]. Attempted to call but no answer, went straight to VM. Left messages to call back.

## 2023-05-25 ENCOUNTER — Encounter
Admission: RE | Admit: 2023-05-25 | Discharge: 2023-05-25 | Disposition: A | Discharge status: Home / Self Care | Payer: Medicare PPO | Source: Ambulatory Visit | Attending: Orthopedic Surgery | Admitting: Orthopedic Surgery

## 2023-05-25 VITALS — Ht 62.0 in | Wt 175.0 lb

## 2023-05-25 DIAGNOSIS — I10 Essential (primary) hypertension: Secondary | ICD-10-CM

## 2023-05-25 DIAGNOSIS — Z01812 Encounter for preprocedural laboratory examination: Secondary | ICD-10-CM

## 2023-05-25 DIAGNOSIS — E1169 Type 2 diabetes mellitus with other specified complication: Secondary | ICD-10-CM | POA: Insufficient documentation

## 2023-05-25 DIAGNOSIS — Z01818 Encounter for other preprocedural examination: Secondary | ICD-10-CM | POA: Insufficient documentation

## 2023-05-25 HISTORY — DX: Other seasonal allergic rhinitis: J30.2

## 2023-05-25 HISTORY — DX: Unilateral primary osteoarthritis, left knee: M17.12

## 2023-05-25 HISTORY — DX: Restless legs syndrome: G25.81

## 2023-05-25 HISTORY — DX: Anemia, unspecified: D64.9

## 2023-05-25 HISTORY — DX: Iron deficiency anemia, unspecified: D50.9

## 2023-05-25 HISTORY — DX: Type 2 diabetes mellitus without complications: E11.9

## 2023-05-25 HISTORY — DX: Irritable bowel syndrome with diarrhea: K58.0

## 2023-05-25 LAB — TYPE AND SCREEN
ABO/RH(D): A POS
Antibody Screen: NEGATIVE

## 2023-05-25 LAB — URINALYSIS, COMPLETE (UACMP) WITH MICROSCOPIC
Bacteria, UA: NONE SEEN
Bilirubin Urine: NEGATIVE
Glucose, UA: NEGATIVE mg/dL
Hgb urine dipstick: NEGATIVE
Ketones, ur: NEGATIVE mg/dL
Leukocytes,Ua: NEGATIVE
Nitrite: NEGATIVE
Protein, ur: NEGATIVE mg/dL
Specific Gravity, Urine: 1.012 (ref 1.005–1.030)
pH: 5 (ref 5.0–8.0)

## 2023-05-25 LAB — BASIC METABOLIC PANEL
Anion gap: 7 (ref 5–15)
BUN: 22 mg/dL (ref 8–23)
CO2: 28 mmol/L (ref 22–32)
Calcium: 8.6 mg/dL — ABNORMAL LOW (ref 8.9–10.3)
Chloride: 107 mmol/L (ref 98–111)
Creatinine, Ser: 0.79 mg/dL (ref 0.44–1.00)
GFR, Estimated: >60 mL/min (ref >60)
Glucose, Bld: 100 mg/dL — ABNORMAL HIGH (ref 70–99)
Potassium: 3.4 mmol/L — ABNORMAL LOW (ref 3.5–5.1)
Sodium: 142 mmol/L (ref 135–145)

## 2023-05-25 LAB — CBC WITH DIFFERENTIAL/PLATELET
Abs Immature Granulocytes: 0.03 10*3/uL (ref 0.00–0.07)
Basophils Absolute: 0 10*3/uL (ref 0.0–0.1)
Basophils Relative: 1 %
Eosinophils Absolute: 0.1 10*3/uL (ref 0.0–0.5)
Eosinophils Relative: 2 %
HCT: 35.2 % — ABNORMAL LOW (ref 36.0–46.0)
Hemoglobin: 10.9 g/dL — ABNORMAL LOW (ref 12.0–15.0)
Immature Granulocytes: 1 %
Lymphocytes Relative: 15 %
Lymphs Abs: 0.8 10*3/uL (ref 0.7–4.0)
MCH: 26.4 pg (ref 26.0–34.0)
MCHC: 31 g/dL (ref 30.0–36.0)
MCV: 85.2 fL (ref 80.0–100.0)
Monocytes Absolute: 0.4 10*3/uL (ref 0.1–1.0)
Monocytes Relative: 8 %
Neutro Abs: 4.1 10*3/uL (ref 1.7–7.7)
Neutrophils Relative %: 73 %
Platelets: 262 10*3/uL (ref 150–400)
RBC: 4.13 MIL/uL (ref 3.87–5.11)
RDW: 21.2 % — ABNORMAL HIGH (ref 11.5–15.5)
Smear Review: NORMAL
WBC: 5.5 10*3/uL (ref 4.0–10.5)
nRBC: 0 % (ref 0.0–0.2)

## 2023-05-25 LAB — SURGICAL PCR SCREEN
MRSA, PCR: NEGATIVE
Staphylococcus aureus: POSITIVE — AB

## 2023-05-25 LAB — PROTIME-INR
INR: 1.2 (ref 0.8–1.2)
Prothrombin Time: 14.9 seconds (ref 11.4–15.2)

## 2023-05-25 LAB — APTT: aPTT: 29 seconds (ref 24–36)

## 2023-05-25 NOTE — Patient Instructions (Signed)
Your procedure is scheduled on: Tuesday, June 18 Report to the Registration Desk on the 1st floor of the [facility] To find out your arrival time, please call [phone] between 1PM - 3PM on: Monday, June 17 If your arrival time is 6:00 am, do not arrive before that time as the [facility] entrance doors do not open until 6:00 am.  REMEMBER: Instructions that are not followed completely may result in serious medical risk, up to and including death; or upon the discretion of your surgeon and anesthesiologist your surgery may need to be rescheduled.  Do not eat or drink after midnight the night before surgery.  No gum chewing or hard candies.  One week prior to surgery: starting June 11 Stop Anti-inflammatories (NSAIDS) such as Advil, Aleve, Ibuprofen, Motrin, Naproxen, Naprosyn and Aspirin based products such as Excedrin, Goody's Powder, BC Powder. Stop ANY OVER THE COUNTER supplements until after surgery. You may however, continue to take Tylenol if needed for pain up until the day of surgery.  Continue taking all prescribed medications   TAKE ONLY THESE MEDICATIONS THE MORNING OF SURGERY WITH A SIP OF WATER:  Buspirone Carvedilol Lorazepam if needed for anxiety Omeprazole (Prilosec) - (take one the night before and one on the morning of surgery - helps to prevent nausea after surgery.) Vilazodone (Viibryd)  Use albuterol inhaler on the day of surgery and bring to the hospital.  No Alcohol for 24 hours before or after surgery.  No Smoking including e-cigarettes for 24 hours before surgery.  No chewable tobacco products for at least 6 hours before surgery.  No nicotine patches on the day of surgery.  Do not use any "recreational" drugs for at least a week (preferably 2 weeks) before your surgery.  Please be advised that the combination of cocaine and anesthesia may have negative outcomes, up to and including death. If you test positive for cocaine, your surgery will be  cancelled.  On the morning of surgery brush your teeth with toothpaste and water, you may rinse your mouth with mouthwash if you wish. Do not swallow any toothpaste or mouthwash.  Use CHG Soap as directed on instruction sheet.  Do not wear jewelry, make-up, hairpins, clips or nail polish.  Do not wear lotions, powders, or perfumes.   Do not shave body hair from the neck down 48 hours before surgery.  Contact lenses, hearing aids and dentures may not be worn into surgery.  Do not bring valuables to the hospital. [facility] is not responsible for any missing/lost belongings or valuables.   Notify your doctor if there is any change in your medical condition (cold, fever, infection).  Wear comfortable clothing (specific to your surgery type) to the hospital.  After surgery, you can help prevent lung complications by doing breathing exercises.  Take deep breaths and cough every 1-2 hours. Your doctor may order a device called an Incentive Spirometer to help you take deep breaths.  If you are being admitted to the hospital overnight, leave your suitcase in the car. After surgery it may be brought to your room.  In case of increased patient census, it may be necessary for you, the patient, to continue your postoperative care in the Same Day Surgery department.  If you are being discharged the day of surgery, you will not be allowed to drive home. You will need a responsible individual to drive you home and stay with you for 24 hours after surgery.   If you are taking public  transportation, you will need to have a responsible individual with you.  Please call the Pre-admissions Testing Dept. at [phone] if you have any questions about these instructions.  Surgery Visitation Policy:  Patients having surgery or a procedure may have two visitors.  Children under the age of 87 must have an adult with them who is not the patient.  Inpatient Visitation:    Visiting hours are 7 a.m.  to 8 p.m. Up to four visitors are allowed at one time in a patient room. The visitors may rotate out with other people during the day.  One visitor age 49 or older may stay with the patient overnight and must be in the room by 8 p.m.    Pre-operative 5 CHG Bath Instructions   You can play a key role in reducing the risk of infection after surgery. Your skin needs to be as free of germs as possible. You can reduce the number of germs on your skin by washing with CHG (chlorhexidine gluconate) soap before surgery. CHG is an antiseptic soap that kills germs and continues to kill germs even after washing.   DO NOT use if you have an allergy to chlorhexidine/CHG or antibacterial soaps. If your skin becomes reddened or irritated, stop using the CHG and notify one of our RNs at [phone].   Please shower with the CHG soap starting 4 days before surgery using the following schedule:     Please keep in mind the following:  DO NOT shave, including legs and underarms, starting the day of your first shower.   You may shave your face at any point before/day of surgery.  Place clean sheets on your bed the day you start using CHG soap. Use a clean washcloth (not used since being washed) for each shower. DO NOT sleep with pets once you start using the CHG.   CHG Shower Instructions:  If you choose to wash your hair and private area, wash first with your normal shampoo/soap.  After you use shampoo/soap, rinse your hair and body thoroughly to remove shampoo/soap residue.  Turn the water OFF and apply about 3 tablespoons (45 ml) of CHG soap to a CLEAN washcloth.  Apply CHG soap ONLY FROM YOUR NECK DOWN TO YOUR TOES (washing for 3-5 minutes)  DO NOT use CHG soap on face, private areas, open wounds, or sores.  Pay special attention to the area where your surgery is being performed.  If you are having back surgery, having someone wash your back for you may be helpful. Wait 2 minutes after CHG soap is  applied, then you may rinse off the CHG soap.  Pat dry with a clean towel  Put on clean clothes/pajamas   If you choose to wear lotion, please use ONLY the CHG-compatible lotions on the back of this paper.     Additional instructions for the day of surgery: DO NOT APPLY any lotions, deodorants, cologne, or perfumes.   Put on clean/comfortable clothes.  Brush your teeth.  Ask your nurse before applying any prescription medications to the skin.      CHG Compatible Lotions   Aveeno Moisturizing lotion  Cetaphil Moisturizing Cream  Cetaphil Moisturizing Lotion  Clairol Herbal Essence Moisturizing Lotion, Dry Skin  Clairol Herbal Essence Moisturizing Lotion, Extra Dry Skin  Clairol Herbal Essence Moisturizing Lotion, Normal Skin  Curel Age Defying Therapeutic Moisturizing Lotion with Alpha Hydroxy  Curel Extreme Care Body Lotion  Curel Soothing Hands Moisturizing Hand Lotion  Curel Therapeutic Moisturizing Cream,  Fragrance-Free  Curel Therapeutic Moisturizing Lotion, Fragrance-Free  Curel Therapeutic Moisturizing Lotion, Original Formula  Eucerin Daily Replenishing Lotion  Eucerin Dry Skin Therapy Plus Alpha Hydroxy Crme  Eucerin Dry Skin Therapy Plus Alpha Hydroxy Lotion  Eucerin Original Crme  Eucerin Original Lotion  Eucerin Plus Crme Eucerin Plus Lotion  Eucerin TriLipid Replenishing Lotion  Keri Anti-Bacterial Hand Lotion  Keri Deep Conditioning Original Lotion Dry Skin Formula Softly Scented  Keri Deep Conditioning Original Lotion, Fragrance Free Sensitive Skin Formula  Keri Lotion Fast Absorbing Fragrance Free Sensitive Skin Formula  Keri Lotion Fast Absorbing Softly Scented Dry Skin Formula  Keri Original Lotion  Keri Skin Renewal Lotion Keri Silky Smooth Lotion  Keri Silky Smooth Sensitive Skin Lotion  Nivea Body Creamy Conditioning Oil  Nivea Body Extra Enriched Lotion  Nivea Body Original Lotion  Nivea Body Sheer Moisturizing Lotion Nivea Crme  Nivea Skin  Firming Lotion  NutraDerm 30 Skin Lotion  NutraDerm Skin Lotion  NutraDerm Therapeutic Skin Cream  NutraDerm Therapeutic Skin Lotion  ProShield Protective Hand Cream  Provon moisturizing lotion  Preoperative Educational Videos for Total Hip, Knee and Shoulder Replacements  To better prepare for surgery, please view our videos that explain the physical activity and discharge planning required to have the best surgical recovery at [facility].  [email or web address]  Questions? Call [phone] or email [email or web address]

## 2023-05-26 LAB — HEMOGLOBIN A1C
Hgb A1c MFr Bld: 6.2 % — ABNORMAL HIGH (ref 4.8–5.6)
Mean Plasma Glucose: 131.24 mg/dL
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
## 2023-05-28 ENCOUNTER — Other Ambulatory Visit: Payer: Medicare PPO

## 2023-05-29 ENCOUNTER — Telehealth: Payer: Self-pay

## 2023-05-29 NOTE — Telephone Encounter (Signed)
Patient states that she is having surgery next week and that she would like for Dr Alena Bills to look at her lab results, specifically the high hemoglobin. Call back # [phone].

## 2023-05-29 NOTE — Telephone Encounter (Signed)
Patient was concerned with her RND being in the 21.2 range knowing that she is supposed to have a procedure next week. I relayed what Dr. Alena Bills told me that she shouldn't be concerned and it was ok for her to proceed with her procedure next week. The patient understood, and she said she will see [place] in August which is her next scheduled appointment with Dr. Alena Bills.

## 2023-05-30 ENCOUNTER — Telehealth: Payer: Self-pay

## 2023-05-30 NOTE — Telephone Encounter (Signed)
Spoke with patient and she can come in on Monday at anytime on Monday. I also sent chat to schedulers about the appointment.

## 2023-05-31 ENCOUNTER — Telehealth (INDEPENDENT_AMBULATORY_CARE_PROVIDER_SITE_OTHER): Payer: Medicare PPO | Admitting: Psychiatry

## 2023-05-31 ENCOUNTER — Encounter: Payer: Self-pay | Admitting: Psychiatry

## 2023-05-31 DIAGNOSIS — F410 Panic disorder [episodic paroxysmal anxiety] without agoraphobia: Secondary | ICD-10-CM

## 2023-05-31 DIAGNOSIS — F411 Generalized anxiety disorder: Secondary | ICD-10-CM

## 2023-05-31 DIAGNOSIS — F4310 Post-traumatic stress disorder, unspecified: Secondary | ICD-10-CM

## 2023-05-31 DIAGNOSIS — F3341 Major depressive disorder, recurrent, in partial remission: Secondary | ICD-10-CM | POA: Diagnosis not present

## 2023-06-04 ENCOUNTER — Inpatient Hospital Stay: Payer: Medicare PPO | Attending: Internal Medicine

## 2023-06-04 ENCOUNTER — Encounter: Payer: Self-pay | Admitting: Internal Medicine

## 2023-06-04 ENCOUNTER — Inpatient Hospital Stay (HOSPITAL_BASED_OUTPATIENT_CLINIC_OR_DEPARTMENT_OTHER): Payer: Medicare PPO | Admitting: Internal Medicine

## 2023-06-04 VITALS — BP 183/76 | HR 72 | Temp 98.1°F | Resp 16 | Ht 62.0 in | Wt 178.9 lb

## 2023-06-04 DIAGNOSIS — R5383 Other fatigue: Secondary | ICD-10-CM | POA: Insufficient documentation

## 2023-06-04 DIAGNOSIS — I10 Essential (primary) hypertension: Secondary | ICD-10-CM | POA: Insufficient documentation

## 2023-06-04 DIAGNOSIS — D509 Iron deficiency anemia, unspecified: Secondary | ICD-10-CM | POA: Insufficient documentation

## 2023-06-04 DIAGNOSIS — D508 Other iron deficiency anemias: Secondary | ICD-10-CM | POA: Diagnosis not present

## 2023-06-04 DIAGNOSIS — Z87891 Personal history of nicotine dependence: Secondary | ICD-10-CM | POA: Diagnosis not present

## 2023-06-04 DIAGNOSIS — Z903 Acquired absence of stomach [part of]: Secondary | ICD-10-CM

## 2023-06-04 DIAGNOSIS — Z803 Family history of malignant neoplasm of breast: Secondary | ICD-10-CM | POA: Diagnosis not present

## 2023-06-04 DIAGNOSIS — Z9884 Bariatric surgery status: Secondary | ICD-10-CM | POA: Diagnosis not present

## 2023-06-04 DIAGNOSIS — R590 Localized enlarged lymph nodes: Secondary | ICD-10-CM | POA: Diagnosis not present

## 2023-06-04 LAB — VITAMIN B12: Vitamin B-12: 311 pg/mL (ref 180–914)

## 2023-06-04 LAB — CBC WITH DIFFERENTIAL/PLATELET
Abs Immature Granulocytes: 0.05 10*3/uL (ref 0.00–0.07)
Basophils Absolute: 0.1 10*3/uL (ref 0.0–0.1)
Basophils Relative: 1 %
Eosinophils Absolute: 0.2 10*3/uL (ref 0.0–0.5)
Eosinophils Relative: 2 %
HCT: 37.1 % (ref 36.0–46.0)
Hemoglobin: 11.8 g/dL — ABNORMAL LOW (ref 12.0–15.0)
Immature Granulocytes: 1 %
Lymphocytes Relative: 17 %
Lymphs Abs: 1.2 10*3/uL (ref 0.7–4.0)
MCH: 27.1 pg (ref 26.0–34.0)
MCHC: 31.8 g/dL (ref 30.0–36.0)
MCV: 85.3 fL (ref 80.0–100.0)
Monocytes Absolute: 0.4 10*3/uL (ref 0.1–1.0)
Monocytes Relative: 5 %
Neutro Abs: 5.4 10*3/uL (ref 1.7–7.7)
Neutrophils Relative %: 74 %
Platelets: 329 10*3/uL (ref 150–400)
RBC: 4.35 MIL/uL (ref 3.87–5.11)
RDW: 19.1 % — ABNORMAL HIGH (ref 11.5–15.5)
WBC: 7.2 10*3/uL (ref 4.0–10.5)
nRBC: 0 % (ref 0.0–0.2)

## 2023-06-04 LAB — IRON AND TIBC
Iron: 54 ug/dL (ref 28–170)
Saturation Ratios: 20 % (ref 10.4–31.8)
TIBC: 274 ug/dL (ref 250–450)
UIBC: 220 ug/dL

## 2023-06-04 LAB — FERRITIN: Ferritin: 67 ng/mL (ref 11–307)

## 2023-06-04 MED ORDER — CHLORHEXIDINE GLUCONATE CLOTH 2 % EX PADS: 6.0000 | MEDICATED_PAD | Freq: Once | CUTANEOUS | Status: DC

## 2023-06-04 MED ORDER — CHLORHEXIDINE GLUCONATE 0.12 % MT SOLN
15.0000 mL | Freq: Once | OROMUCOSAL | Status: AC
  Administered 2023-06-05: 15 mL via OROMUCOSAL

## 2023-06-04 MED ORDER — CEFAZOLIN SODIUM-DEXTROSE 2-4 GM/100ML-% IV SOLN
2.0000 g | INTRAVENOUS | Status: AC
  Administered 2023-06-05: 2 g via INTRAVENOUS

## 2023-06-04 MED ORDER — SODIUM CHLORIDE 0.9 % IV SOLN: INTRAVENOUS | Status: DC

## 2023-06-04 MED ORDER — ORAL CARE MOUTH RINSE: 15.0000 mL | Freq: Once | OROMUCOSAL | Status: AC

## 2023-06-04 MED ORDER — ACETAMINOPHEN 500 MG PO TABS
1000.0000 mg | ORAL_TABLET | ORAL | Status: AC
  Administered 2023-06-05: 1000 mg via ORAL

## 2023-06-04 NOTE — Progress Notes (Signed)
[facility]  [phone] [phone]  ID: Amanda Wang OB: 14-Jan-1951  MR#: [id number]  AOZ#:[id number]  Patient Care Team: Smitty Cords, DO as PCP - General (Family Medicine) Midge Minium, MD as Consulting Physician (Gastroenterology)   HPI: Amanda Wang is a 72 y.o. female with past medical history of hypertension, hyperlipidemia, GERD, chiari malformation type I, arthritis, sleep apnea was referred to hematology for management of iron deficiency anemia  Patient was seen by primary on February 23, 2023 for symptomatic anemia.  She feels very tired.  CBC showed hemoglobin 8.5, MCV 73.5.  Iron panel showed saturation 9%.  Ferritin 6.  B12 level 561.  Patient has history of gastric bypass surgery.  She has chronic history of iron deficiency anemia.  Colonoscopy performed for the same reason by Dr. Tobi Bastos in November 2022 was normal.  Endoscopy showed multiple 5 to 10 mm sessile polyp without bleeding in the stomach.  Biopsy showed hyperplastic polyps.   Interval history- Patient was seen today as follow-up for labs. She is doing well overall.  Scheduled for left knee surgery tomorrow.  Reports feeling fatigued.  Noticed mild improvement with iron infusions but nothing significant.  Completed IV Venofer x 5 doses and tolerated well.  Patient was concerned about her elevated RDW.  This was clarified in detail and she felt reassured.  REVIEW OF SYSTEMS:   ROS  As per HPI. Otherwise, a complete review of systems is negative.  PAST MEDICAL HISTORY: Past Medical History:  Diagnosis Date   Advanced care planning/counseling discussion 09/16/2018   Anemia    Arthritis    Chiari malformation type I (HCC)    Depression    Diabetes mellitus, type 2 (HCC)    GERD (gastroesophageal reflux disease)    History of hiatal hernia    Hyperlipidemia    Hypertension    Iron deficiency anemia    Irritable bowel syndrome with diarrhea     Osteoarthritis of left knee    PONV (postoperative nausea and vomiting)    Restless legs    Seasonal allergies    Sleep apnea    no Cpap use   Small intestinal bacterial overgrowth    Spinal stenosis    Vaccine reaction, initial encounter 03/11/2020    PAST SURGICAL HISTORY: Past Surgical History:  Procedure Laterality Date   BREAST BIOPSY Right    neg   chiari malformation I decompression surgery  10/2005   COLONOSCOPY WITH PROPOFOL N/A 11/28/2018   Procedure: COLONOSCOPY WITH PROPOFOL;  Surgeon: Wyline Mood, MD;  Location: [facility] ENDOSCOPY;  Service: Gastroenterology;  Laterality: N/A;   COLONOSCOPY WITH PROPOFOL N/A 10/27/2021   Procedure: COLONOSCOPY WITH PROPOFOL;  Surgeon: Wyline Mood, MD;  Location: [facility] ENDOSCOPY;  Service: Gastroenterology;  Laterality: N/A;   ESOPHAGOGASTRODUODENOSCOPY N/A 10/27/2021   Procedure: ESOPHAGOGASTRODUODENOSCOPY (EGD);  Surgeon: Wyline Mood, MD;  Location: [facility] ENDOSCOPY;  Service: Gastroenterology;  Laterality: N/A;   HERNIA REPAIR     s/p gastric sleeve   KNEE ARTHROSCOPY WITH MEDIAL MENISECTOMY Left 01/12/2022   Procedure: KNEE ARTHROSCOPY WITH MEDIAL MENISECTOMY;  Surgeon: Juanell Fairly, MD;  Location: [facility] ORS;  Service: Orthopedics;  Laterality: Left;   LAPAROSCOPIC GASTRIC SLEEVE RESECTION     LASIK Bilateral    NASAL SEPTUM SURGERY      FAMILY HISTORY: Family History  Problem Relation Age of Onset   Hypertension Mother    Cancer Mother    Stroke Mother    Heart disease Father  Breast cancer Sister 24   Breast cancer Paternal Aunt    Diabetes Paternal Grandmother     HEALTH MAINTENANCE: Social History   Tobacco Use   Smoking status: Former    Packs/day: 0.50    Years: 1.00    Additional pack years: 0.00    Total pack years: 0.50    Types: Cigarettes    Quit date: 07/26/1977    Years since quitting: 45.8    Passive exposure: Never   Smokeless tobacco: Never   Tobacco comments:    barley smoked when smoked    Vaping Use   Vaping Use: Never used  Substance Use Topics   Alcohol use: Yes    Alcohol/week: 0.0 standard drinks of alcohol    Comment: on occasion 1-2 x month   Drug use: No     Allergies  Allergen Reactions   Fexofenadine Anxiety    Makes her have a panic attack   Codeine Nausea Only    Dizziness   Methylphenidate Hcl     "Caused a seizure"   Zoloft [Sertraline] Other (See Comments)    Sleepiness   Magnevist [Gadopentetate] Hives and Itching    IV Contrast   Sulfa Antibiotics Rash    Current Outpatient Medications  Medication Sig Dispense Refill   ACCU-CHEK GUIDE test strip Use to check blood sugar 1 x daily as instructed 100 strip 3   Accu-Chek Softclix Lancets lancets TEST TWO TO THREE TIMES DAILY DX E11.69 300 each 2   acyclovir (ZOVIRAX) 200 MG capsule Take 200 mg by mouth 5 (five) times daily as needed (fever blisters).     albuterol (VENTOLIN HFA) 108 (90 Base) MCG/ACT inhaler INHALE 2 INHALATIONS INTO THE LUNGS EVERY 4 HOURS AS NEEDED     atorvastatin (LIPITOR) 40 MG tablet Take 1 tablet (40 mg total) by mouth at bedtime. 90 tablet 3   azelastine (ASTELIN) 0.1 % nasal spray Place 1 spray into both nostrils 2 (two) times daily.     Blood Glucose Monitoring Suppl (ACCU-CHEK GUIDE) w/Device KIT 1 Device by Does not apply route 4 (four) times daily. DX E11.69 1 kit 0   busPIRone (BUSPAR) 5 MG tablet Take 1 tablet (5 mg total) by mouth 2 (two) times daily. 180 tablet 0   Carboxymethylcellul-Glycerin (LUBRICATING EYE DROPS OP) Place 1 drop into both eyes daily as needed (dry eyes).     carvedilol (COREG) 6.25 MG tablet Take 1 tablet (6.25 mg total) by mouth 2 (two) times daily with a meal. 180 tablet 3   Charcoal Activated (ACTIVATED CHARCOAL PO) Take 2 capsules by mouth as needed (gas).     Cholecalciferol 125 MCG (5000 UT) TABS      cyclobenzaprine (FLEXERIL) 10 MG tablet take 1 tablet by mouth three times a day if needed for muscle spasm 90 tablet 3   gabapentin  (NEURONTIN) 600 MG tablet Take 1 tablet (600 mg total) by mouth at bedtime. 90 tablet 3   ipratropium (ATROVENT) 0.06 % nasal spray Place 2 sprays into both nostrils 4 (four) times daily. For up to 5-7 days then stop. 15 mL 0   levETIRAcetam (KEPPRA) 500 MG tablet Take 1 tablet (500 mg total) by mouth 2 (two) times daily. (Patient taking differently: Take 500 mg by mouth at bedtime.) 180 tablet 3   Lidocaine-Menthol (ICY HOT MAX LIDOCAINE EX) Apply 1 application. topically daily as needed (pain).     LORazepam (ATIVAN) 1 MG tablet Take 0.5-1 tablets (0.5-1 mg total) by mouth  daily as needed for anxiety. 30 tablet 2   meloxicam (MOBIC) 7.5 MG tablet Take 7.5 mg by mouth 2 (two) times daily.     omeprazole (PRILOSEC) 40 MG capsule Take 1 capsule (40 mg total) by mouth daily before breakfast. (Patient taking differently: Take 40 mg by mouth at bedtime.) 90 capsule 3   Spacer/Aero-Holding Chambers (AEROCHAMBER PLUS WITH MASK) inhaler Use with inhaler as needed. 1 each 0   valsartan (DIOVAN) 320 MG tablet Take 1 tablet (320 mg total) by mouth daily. 90 tablet 3   Vilazodone HCl (VIIBRYD) 40 MG TABS Take 1 tablet (40 mg total) by mouth daily. 90 tablet 0   No current facility-administered medications for this visit.    OBJECTIVE: Vitals:   06/04/23 0948  BP: (!) 183/76  Pulse: 72  Resp: 16  Temp: 98.1 F (36.7 C)  SpO2: 100%     Body mass index is 32.72 kg/m.      General: Well-developed, well-nourished, no acute distress. Eyes: Pink conjunctiva, anicteric sclera. HEENT: Normocephalic, moist mucous membranes, clear oropharnyx. Lungs: Clear to auscultation bilaterally. Heart: Regular rate and rhythm. No rubs, murmurs, or gallops. Abdomen: Soft, nontender, nondistended. No organomegaly noted, normoactive bowel sounds. Musculoskeletal: No edema, cyanosis, or clubbing. Neuro: Alert, answering all questions appropriately. Cranial nerves grossly intact. Skin: No rashes or petechiae  noted. Psych: Normal affect. Lymphatics: No palpable lymphadenopathy in the neck.   LAB RESULTS:  Lab Results  Component Value Date   NA 142 05/25/2023   K 3.4 (L) 05/25/2023   CL 107 05/25/2023   CO2 28 05/25/2023   GLUCOSE 100 (H) 05/25/2023   BUN 22 05/25/2023   CREATININE 0.79 05/25/2023   CALCIUM 8.6 (L) 05/25/2023   PROT 6.8 02/20/2023   ALBUMIN 4.7 10/06/2021   AST 13 02/20/2023   ALT 9 02/20/2023   ALKPHOS 134 (H) 10/06/2021   BILITOT 0.4 02/20/2023   GFRNONAA >60 05/25/2023   GFRAA 96 01/11/2021    Lab Results  Component Value Date   WBC 7.2 06/04/2023   NEUTROABS 5.4 06/04/2023   HGB 11.8 (L) 06/04/2023   HCT 37.1 06/04/2023   MCV 85.3 06/04/2023   PLT 329 06/04/2023    Lab Results  Component Value Date   TIBC 342 02/20/2023   TIBC 298 12/30/2021   TIBC 323 10/07/2021   FERRITIN 6 (L) 02/20/2023   FERRITIN 27 12/30/2021   FERRITIN 17 10/07/2021   IRONPCTSAT 9 (L) 02/20/2023   IRONPCTSAT 14 (L) 12/30/2021   IRONPCTSAT 7 (LL) 10/07/2021     STUDIES: No results found.  ASSESSMENT AND PLAN:   Amanda Wang is a 72 y.o. female with pmh of hypertension, hyperlipidemia, GERD, chiari malformation type I, arthritis, sleep apnea was referred to hematology for management of iron deficiency anemia  # Iron deficiency anemia # History of gastric bypass surgery -Likely malabsorption from gastric bypass surgery. Colonoscopy performed by Dr. Tobi Bastos in November 2022 was normal.  Endoscopy showed multiple 5 to 10 mm sessile polyp without bleeding in the stomach.  Biopsy showed hyperplastic polyps.  -Ferritin of 6 and Hb 8.5 (02/21/2023).  Completed IV Venofer 200 mg weekly x 5 doses.  Hemoglobin improved to 11.8 today.  Iron panel is pending.  Reports mild improvement in her fatigue nothing significant.  She is scheduled for left knee replacement tomorrow.  -Advised to take vitamin B12 1000 mcg daily.  Vitamin B12 level pending.  # Cervical  lymphadenopathy -Resolved.  Most likely benign.  Ultrasound neck  was normal.  No further workup indicated at this time.  Orders Placed This Encounter  Procedures   CBC with Differential/Platelet   Iron and TIBC   Ferritin   RTC in 4 months for MD visit, labs   Patient expressed understanding and was in agreement with this plan. She also understands that She can call clinic at any time with any questions, concerns, or complaints.   I spent a total of 25 minutes reviewing chart data, face-to-face evaluation with the patient, counseling and coordination of care as detailed above.  Michaelyn Barter, MD   06/04/2023 10:36 AM

## 2023-06-05 ENCOUNTER — Inpatient Hospital Stay
Admission: AD | Admit: 2023-06-05 | Discharge: 2023-06-07 | DRG: 469 | Disposition: A | Discharge status: Home Health | Payer: Medicare PPO | Attending: Orthopedic Surgery | Admitting: Orthopedic Surgery

## 2023-06-05 ENCOUNTER — Ambulatory Visit: Payer: Medicare PPO | Admitting: Anesthesiology

## 2023-06-05 ENCOUNTER — Encounter: Payer: Self-pay | Admitting: Orthopedic Surgery

## 2023-06-05 ENCOUNTER — Other Ambulatory Visit: Payer: Self-pay

## 2023-06-05 ENCOUNTER — Ambulatory Visit: Payer: Medicare PPO | Admitting: Urgent Care

## 2023-06-05 ENCOUNTER — Ambulatory Visit: Payer: Medicare PPO

## 2023-06-05 ENCOUNTER — Encounter
Admission: AD | Disposition: A | Discharge status: Home Health | Payer: Self-pay | Source: Home / Self Care | Attending: Orthopedic Surgery

## 2023-06-05 DIAGNOSIS — E669 Obesity, unspecified: Secondary | ICD-10-CM | POA: Diagnosis present

## 2023-06-05 DIAGNOSIS — R110 Nausea: Secondary | ICD-10-CM | POA: Diagnosis not present

## 2023-06-05 DIAGNOSIS — E860 Dehydration: Secondary | ICD-10-CM | POA: Diagnosis present

## 2023-06-05 DIAGNOSIS — Z87891 Personal history of nicotine dependence: Secondary | ICD-10-CM | POA: Diagnosis not present

## 2023-06-05 DIAGNOSIS — F339 Major depressive disorder, recurrent, unspecified: Secondary | ICD-10-CM | POA: Diagnosis present

## 2023-06-05 DIAGNOSIS — F410 Panic disorder [episodic paroxysmal anxiety] without agoraphobia: Secondary | ICD-10-CM | POA: Diagnosis present

## 2023-06-05 DIAGNOSIS — Z8249 Family history of ischemic heart disease and other diseases of the circulatory system: Secondary | ICD-10-CM

## 2023-06-05 DIAGNOSIS — M1712 Unilateral primary osteoarthritis, left knee: Principal | ICD-10-CM | POA: Diagnosis present

## 2023-06-05 DIAGNOSIS — E1169 Type 2 diabetes mellitus with other specified complication: Secondary | ICD-10-CM | POA: Diagnosis present

## 2023-06-05 DIAGNOSIS — Z833 Family history of diabetes mellitus: Secondary | ICD-10-CM

## 2023-06-05 DIAGNOSIS — R410 Disorientation, unspecified: Secondary | ICD-10-CM | POA: Diagnosis not present

## 2023-06-05 DIAGNOSIS — Z882 Allergy status to sulfonamides status: Secondary | ICD-10-CM

## 2023-06-05 DIAGNOSIS — Z6832 Body mass index (BMI) 32.0-32.9, adult: Secondary | ICD-10-CM

## 2023-06-05 DIAGNOSIS — Z9884 Bariatric surgery status: Secondary | ICD-10-CM | POA: Diagnosis not present

## 2023-06-05 DIAGNOSIS — R4182 Altered mental status, unspecified: Secondary | ICD-10-CM | POA: Diagnosis not present

## 2023-06-05 DIAGNOSIS — Z79899 Other long term (current) drug therapy: Secondary | ICD-10-CM | POA: Diagnosis not present

## 2023-06-05 DIAGNOSIS — I10 Essential (primary) hypertension: Secondary | ICD-10-CM | POA: Diagnosis present

## 2023-06-05 DIAGNOSIS — G9341 Metabolic encephalopathy: Secondary | ICD-10-CM | POA: Diagnosis not present

## 2023-06-05 DIAGNOSIS — E785 Hyperlipidemia, unspecified: Secondary | ICD-10-CM | POA: Diagnosis present

## 2023-06-05 DIAGNOSIS — K219 Gastro-esophageal reflux disease without esophagitis: Secondary | ICD-10-CM | POA: Diagnosis present

## 2023-06-05 DIAGNOSIS — D6489 Other specified anemias: Secondary | ICD-10-CM | POA: Diagnosis not present

## 2023-06-05 DIAGNOSIS — N179 Acute kidney failure, unspecified: Secondary | ICD-10-CM | POA: Diagnosis present

## 2023-06-05 DIAGNOSIS — G2581 Restless legs syndrome: Secondary | ICD-10-CM | POA: Diagnosis present

## 2023-06-05 DIAGNOSIS — Z96652 Presence of left artificial knee joint: Secondary | ICD-10-CM | POA: Diagnosis present

## 2023-06-05 DIAGNOSIS — Z01812 Encounter for preprocedural laboratory examination: Secondary | ICD-10-CM

## 2023-06-05 DIAGNOSIS — Z888 Allergy status to other drugs, medicaments and biological substances status: Secondary | ICD-10-CM | POA: Diagnosis not present

## 2023-06-05 DIAGNOSIS — Z791 Long term (current) use of non-steroidal anti-inflammatories (NSAID): Secondary | ICD-10-CM

## 2023-06-05 DIAGNOSIS — K589 Irritable bowel syndrome without diarrhea: Secondary | ICD-10-CM | POA: Diagnosis present

## 2023-06-05 DIAGNOSIS — F05 Delirium due to known physiological condition: Secondary | ICD-10-CM | POA: Diagnosis not present

## 2023-06-05 DIAGNOSIS — D508 Other iron deficiency anemias: Secondary | ICD-10-CM | POA: Diagnosis not present

## 2023-06-05 DIAGNOSIS — R262 Difficulty in walking, not elsewhere classified: Secondary | ICD-10-CM | POA: Diagnosis not present

## 2023-06-05 DIAGNOSIS — D509 Iron deficiency anemia, unspecified: Secondary | ICD-10-CM | POA: Diagnosis present

## 2023-06-05 DIAGNOSIS — Z471 Aftercare following joint replacement surgery: Secondary | ICD-10-CM | POA: Diagnosis not present

## 2023-06-05 HISTORY — PX: TOTAL KNEE ARTHROPLASTY: SHX125

## 2023-06-05 LAB — ABO/RH: ABO/RH(D): A POS

## 2023-06-05 LAB — GLUCOSE, CAPILLARY: Glucose-Capillary: 113 mg/dL — ABNORMAL HIGH (ref 70–99)

## 2023-06-05 SURGERY — ARTHROPLASTY, KNEE, TOTAL
Anesthesia: General | Site: Knee | Laterality: Left

## 2023-06-05 MED ORDER — IRBESARTAN 150 MG PO TABS
300.0000 mg | ORAL_TABLET | Freq: Every day | ORAL | Status: DC
  Administered 2023-06-05 – 2023-06-06 (×2): 300 mg via ORAL
  Filled 2023-06-05 (×2): qty 2

## 2023-06-05 MED ORDER — CEFAZOLIN SODIUM-DEXTROSE 2-4 GM/100ML-% IV SOLN
INTRAVENOUS | Status: AC
  Filled 2023-06-05: qty 100

## 2023-06-05 MED ORDER — LIDOCAINE HCL (PF) 2 % IJ SOLN
INTRAMUSCULAR | Status: AC
  Filled 2023-06-05: qty 5

## 2023-06-05 MED ORDER — FENTANYL CITRATE (PF) 100 MCG/2ML IJ SOLN
INTRAMUSCULAR | Status: AC
  Filled 2023-06-05: qty 2

## 2023-06-05 MED ORDER — ACETAMINOPHEN 500 MG PO TABS
ORAL_TABLET | ORAL | Status: AC
  Filled 2023-06-05: qty 2

## 2023-06-05 MED ORDER — BISACODYL 5 MG PO TBEC: 5.0000 mg | DELAYED_RELEASE_TABLET | Freq: Every day | ORAL | Status: DC | PRN

## 2023-06-05 MED ORDER — CHLORHEXIDINE GLUCONATE 0.12 % MT SOLN
OROMUCOSAL | Status: AC
  Filled 2023-06-05: qty 15

## 2023-06-05 MED ORDER — LACTATED RINGERS IV SOLN: INTRAVENOUS | Status: DC

## 2023-06-05 MED ORDER — VILAZODONE HCL 20 MG PO TABS
40.0000 mg | ORAL_TABLET | Freq: Every day | ORAL | Status: DC
  Administered 2023-06-06 – 2023-06-07 (×2): 40 mg via ORAL
  Filled 2023-06-05 (×2): qty 2

## 2023-06-05 MED ORDER — SODIUM CHLORIDE 0.9 % IV SOLN: INTRAVENOUS | Status: DC

## 2023-06-05 MED ORDER — ONDANSETRON HCL 4 MG PO TABS
4.0000 mg | ORAL_TABLET | Freq: Four times a day (QID) | ORAL | Status: DC | PRN
  Administered 2023-06-06: 4 mg via ORAL

## 2023-06-05 MED ORDER — SENNOSIDES-DOCUSATE SODIUM 8.6-50 MG PO TABS: 1.0000 | ORAL_TABLET | Freq: Every evening | ORAL | Status: DC | PRN

## 2023-06-05 MED ORDER — CYCLOBENZAPRINE HCL 5 MG PO TABS: 5.0000 mg | ORAL_TABLET | Freq: Three times a day (TID) | ORAL | Status: DC | PRN

## 2023-06-05 MED ORDER — SODIUM CHLORIDE FLUSH 0.9 % IV SOLN
INTRAVENOUS | Status: AC
  Filled 2023-06-05: qty 40

## 2023-06-05 MED ORDER — PROPOFOL 10 MG/ML IV BOLUS
INTRAVENOUS | Status: DC | PRN
  Administered 2023-06-05: 30 mg via INTRAVENOUS

## 2023-06-05 MED ORDER — GABAPENTIN 300 MG PO CAPS
600.0000 mg | ORAL_CAPSULE | Freq: Every day | ORAL | Status: DC
  Administered 2023-06-05 – 2023-06-06 (×2): 600 mg via ORAL

## 2023-06-05 MED ORDER — FLEET ENEMA 7-19 GM/118ML RE ENEM: 1.0000 | ENEMA | Freq: Once | RECTAL | Status: DC | PRN

## 2023-06-05 MED ORDER — ATORVASTATIN CALCIUM 20 MG PO TABS
ORAL_TABLET | ORAL | Status: AC
  Filled 2023-06-05: qty 2

## 2023-06-05 MED ORDER — PROPOFOL 1000 MG/100ML IV EMUL
INTRAVENOUS | Status: AC
  Filled 2023-06-05: qty 200

## 2023-06-05 MED ORDER — OXYCODONE HCL 5 MG PO TABS: 5.0000 mg | ORAL_TABLET | ORAL | Status: DC | PRN

## 2023-06-05 MED ORDER — ACETAMINOPHEN 500 MG PO TABS
1000.0000 mg | ORAL_TABLET | Freq: Four times a day (QID) | ORAL | Status: AC
  Administered 2023-06-05 – 2023-06-06 (×3): 1000 mg via ORAL

## 2023-06-05 MED ORDER — FENTANYL CITRATE (PF) 100 MCG/2ML IJ SOLN
25.0000 ug | INTRAMUSCULAR | Status: DC | PRN
  Administered 2023-06-05 (×4): 25 ug via INTRAVENOUS

## 2023-06-05 MED ORDER — ACETAMINOPHEN 500 MG PO TABS
ORAL_TABLET | ORAL | Status: AC
  Filled 2023-06-05: qty 1

## 2023-06-05 MED ORDER — HYDROMORPHONE HCL 1 MG/ML IJ SOLN
INTRAMUSCULAR | Status: AC
  Filled 2023-06-05: qty 1

## 2023-06-05 MED ORDER — TRAMADOL HCL 50 MG PO TABS
50.0000 mg | ORAL_TABLET | Freq: Four times a day (QID) | ORAL | Status: DC
  Administered 2023-06-05 – 2023-06-07 (×3): 50 mg via ORAL

## 2023-06-05 MED ORDER — SENNA 8.6 MG PO TABS
1.0000 | ORAL_TABLET | Freq: Two times a day (BID) | ORAL | Status: DC
  Administered 2023-06-05 – 2023-06-07 (×4): 8.6 mg via ORAL

## 2023-06-05 MED ORDER — OXYCODONE HCL 5 MG/5ML PO SOLN: 5.0000 mg | Freq: Once | ORAL | Status: AC | PRN

## 2023-06-05 MED ORDER — PANTOPRAZOLE SODIUM 40 MG PO TBEC
80.0000 mg | DELAYED_RELEASE_TABLET | Freq: Every day | ORAL | Status: DC
  Administered 2023-06-06 – 2023-06-07 (×2): 80 mg via ORAL

## 2023-06-05 MED ORDER — PROPOFOL 10 MG/ML IV BOLUS
INTRAVENOUS | Status: AC
  Filled 2023-06-05: qty 20

## 2023-06-05 MED ORDER — BUPIVACAINE HCL (PF) 0.5 % IJ SOLN
INTRAMUSCULAR | Status: DC | PRN
  Administered 2023-06-05: 2.6 mL

## 2023-06-05 MED ORDER — BUSPIRONE HCL 5 MG PO TABS
5.0000 mg | ORAL_TABLET | Freq: Two times a day (BID) | ORAL | Status: DC
  Administered 2023-06-05 – 2023-06-07 (×4): 5 mg via ORAL
  Filled 2023-06-05 (×4): qty 1

## 2023-06-05 MED ORDER — SENNA 8.6 MG PO TABS
ORAL_TABLET | ORAL | Status: AC
  Filled 2023-06-05: qty 1

## 2023-06-05 MED ORDER — AZELASTINE HCL 0.1 % NA SOLN
1.0000 | Freq: Two times a day (BID) | NASAL | Status: DC
  Administered 2023-06-05 – 2023-06-06 (×3): 1 via NASAL
  Filled 2023-06-05 (×2): qty 30

## 2023-06-05 MED ORDER — IPRATROPIUM BROMIDE 0.06 % NA SOLN: 2.0000 | Freq: Four times a day (QID) | NASAL | Status: DC | PRN

## 2023-06-05 MED ORDER — CEFAZOLIN SODIUM-DEXTROSE 2-4 GM/100ML-% IV SOLN
2.0000 g | Freq: Four times a day (QID) | INTRAVENOUS | Status: AC
  Administered 2023-06-05 (×2): 2 g via INTRAVENOUS

## 2023-06-05 MED ORDER — LORAZEPAM 0.5 MG PO TABS: 0.5000 mg | ORAL_TABLET | Freq: Every day | ORAL | Status: DC | PRN

## 2023-06-05 MED ORDER — OXYCODONE HCL 5 MG PO TABS
5.0000 mg | ORAL_TABLET | Freq: Once | ORAL | Status: AC | PRN
  Administered 2023-06-05: 5 mg via ORAL

## 2023-06-05 MED ORDER — DEXAMETHASONE SODIUM PHOSPHATE 10 MG/ML IJ SOLN
INTRAMUSCULAR | Status: DC | PRN
  Administered 2023-06-05: 10 mg via INTRAVENOUS

## 2023-06-05 MED ORDER — POLYVINYL ALCOHOL 1.4 % OP SOLN: Freq: Every day | OPHTHALMIC | Status: DC | PRN

## 2023-06-05 MED ORDER — OXYCODONE HCL 5 MG PO TABS
ORAL_TABLET | ORAL | Status: AC
  Filled 2023-06-05: qty 2

## 2023-06-05 MED ORDER — ENOXAPARIN SODIUM 40 MG/0.4ML IJ SOSY
40.0000 mg | PREFILLED_SYRINGE | INTRAMUSCULAR | Status: DC
  Administered 2023-06-06 – 2023-06-07 (×2): 40 mg via SUBCUTANEOUS

## 2023-06-05 MED ORDER — CARVEDILOL 12.5 MG PO TABS
ORAL_TABLET | ORAL | Status: AC
  Filled 2023-06-05: qty 1

## 2023-06-05 MED ORDER — FENTANYL CITRATE (PF) 100 MCG/2ML IJ SOLN
INTRAMUSCULAR | Status: DC | PRN
  Administered 2023-06-05: 50 ug via INTRAVENOUS

## 2023-06-05 MED ORDER — ACETAMINOPHEN 10 MG/ML IV SOLN: 1000.0000 mg | Freq: Once | INTRAVENOUS | Status: AC | PRN

## 2023-06-05 MED ORDER — NEOMYCIN-POLYMYXIN B GU 40-200000 IR SOLN
Status: AC
  Filled 2023-06-05: qty 20

## 2023-06-05 MED ORDER — GABAPENTIN 300 MG PO CAPS
ORAL_CAPSULE | ORAL | Status: AC
  Filled 2023-06-05: qty 2

## 2023-06-05 MED ORDER — ATORVASTATIN CALCIUM 20 MG PO TABS
40.0000 mg | ORAL_TABLET | Freq: Every day | ORAL | Status: DC
  Administered 2023-06-05 – 2023-06-06 (×2): 40 mg via ORAL

## 2023-06-05 MED ORDER — ONDANSETRON HCL 4 MG/2ML IJ SOLN: 4.0000 mg | Freq: Four times a day (QID) | INTRAMUSCULAR | Status: DC | PRN

## 2023-06-05 MED ORDER — ACETAMINOPHEN 325 MG PO TABS
325.0000 mg | ORAL_TABLET | Freq: Four times a day (QID) | ORAL | Status: DC | PRN
  Administered 2023-06-06 – 2023-06-07 (×3): 650 mg via ORAL

## 2023-06-05 MED ORDER — SCOPOLAMINE 1 MG/3DAYS TD PT72
MEDICATED_PATCH | TRANSDERMAL | Status: AC
  Filled 2023-06-05: qty 1

## 2023-06-05 MED ORDER — OXYCODONE HCL 5 MG PO TABS
10.0000 mg | ORAL_TABLET | ORAL | Status: DC | PRN
  Administered 2023-06-05 – 2023-06-06 (×4): 10 mg via ORAL

## 2023-06-05 MED ORDER — BUPIVACAINE HCL (PF) 0.5 % IJ SOLN
INTRAMUSCULAR | Status: AC
  Filled 2023-06-05: qty 10

## 2023-06-05 MED ORDER — SODIUM CHLORIDE 0.9 % IR SOLN
Status: DC | PRN
  Administered 2023-06-05: 3012 mL

## 2023-06-05 MED ORDER — ALBUTEROL SULFATE (2.5 MG/3ML) 0.083% IN NEBU: 3.0000 mL | INHALATION_SOLUTION | Freq: Four times a day (QID) | RESPIRATORY_TRACT | Status: DC | PRN

## 2023-06-05 MED ORDER — ROCURONIUM BROMIDE 10 MG/ML (PF) SYRINGE
PREFILLED_SYRINGE | INTRAVENOUS | Status: AC
  Filled 2023-06-05: qty 10

## 2023-06-05 MED ORDER — CARVEDILOL 12.5 MG PO TABS
6.2500 mg | ORAL_TABLET | Freq: Two times a day (BID) | ORAL | Status: DC
  Administered 2023-06-05 – 2023-06-07 (×4): 6.25 mg via ORAL

## 2023-06-05 MED ORDER — HYDROMORPHONE HCL 1 MG/ML IJ SOLN: 0.5000 mg | INTRAMUSCULAR | Status: DC | PRN

## 2023-06-05 MED ORDER — MIDAZOLAM HCL 5 MG/5ML IJ SOLN
INTRAMUSCULAR | Status: DC | PRN
  Administered 2023-06-05: 2 mg via INTRAVENOUS

## 2023-06-05 MED ORDER — ONDANSETRON HCL 4 MG/2ML IJ SOLN
INTRAMUSCULAR | Status: AC
  Filled 2023-06-05: qty 2

## 2023-06-05 MED ORDER — ONDANSETRON HCL 4 MG/2ML IJ SOLN: 4.0000 mg | Freq: Once | INTRAMUSCULAR | Status: DC | PRN

## 2023-06-05 MED ORDER — MIDAZOLAM HCL 2 MG/2ML IJ SOLN
INTRAMUSCULAR | Status: AC
  Filled 2023-06-05: qty 2

## 2023-06-05 MED ORDER — PROPOFOL 500 MG/50ML IV EMUL
INTRAVENOUS | Status: DC | PRN
  Administered 2023-06-05: 50 ug/kg/min via INTRAVENOUS

## 2023-06-05 MED ORDER — SODIUM CHLORIDE 0.9 % IR SOLN
Status: DC | PRN
  Administered 2023-06-05: 1004 mL

## 2023-06-05 MED ORDER — TRAMADOL HCL 50 MG PO TABS
ORAL_TABLET | ORAL | Status: AC
  Filled 2023-06-05: qty 1

## 2023-06-05 MED ORDER — SODIUM CHLORIDE (PF) 0.9 % IJ SOLN
INTRAMUSCULAR | Status: DC | PRN
  Administered 2023-06-05: 60 mL via INTRAMUSCULAR

## 2023-06-05 MED ORDER — OXYCODONE HCL 5 MG PO TABS
ORAL_TABLET | ORAL | Status: AC
  Filled 2023-06-05: qty 1

## 2023-06-05 MED ORDER — SCOPOLAMINE 1 MG/3DAYS TD PT72
1.0000 | MEDICATED_PATCH | TRANSDERMAL | Status: DC
  Administered 2023-06-05: 1.5 mg via TRANSDERMAL

## 2023-06-05 MED ORDER — DEXAMETHASONE SODIUM PHOSPHATE 10 MG/ML IJ SOLN
INTRAMUSCULAR | Status: AC
  Filled 2023-06-05: qty 1

## 2023-06-05 MED ORDER — ONDANSETRON HCL 4 MG/2ML IJ SOLN
INTRAMUSCULAR | Status: DC | PRN
  Administered 2023-06-05: 4 mg via INTRAVENOUS

## 2023-06-05 MED ORDER — BUPIVACAINE LIPOSOME 1.3 % IJ SUSP
INTRAMUSCULAR | Status: AC
  Filled 2023-06-05: qty 20

## 2023-06-05 SURGICAL SUPPLY — 72 items
ATTUNE PSFEM LTSZ4 NARCEM KNEE (Femur) IMPLANT
ATTUNE PSRP INSR SZ4 7 KNEE (Insert) IMPLANT
BASEPLATE TIBIAL ROTATING SZ 4 (Knees) IMPLANT
BLADE SAW 70X12.5 (BLADE) ×1 IMPLANT
BLADE SAW 90X13X1.19 OSCILLAT (BLADE) IMPLANT
BLADE SAW 90X25X1.19 OSCILLAT (BLADE) ×1 IMPLANT
BSPLAT TIB 4 CMNT ROT PLAT STR (Knees) ×1 IMPLANT
CEMENT HV SMART SET (Cement) ×2 IMPLANT
CNTNR URN SCR LID CUP LEK RST (MISCELLANEOUS) ×1 IMPLANT
CONT SPEC 4OZ STRL OR WHT (MISCELLANEOUS) ×1
COOLER POLAR GLACIER W/PUMP (MISCELLANEOUS) ×1 IMPLANT
CUFF TOURN SGL QUICK 24 (TOURNIQUET CUFF)
CUFF TOURN SGL QUICK 34 (TOURNIQUET CUFF)
CUFF TRNQT CYL 24X4X16.5-23 (TOURNIQUET CUFF) ×1 IMPLANT
CUFF TRNQT CYL 34X4.125X (TOURNIQUET CUFF) IMPLANT
DRAPE 3/4 80X56 (DRAPES) ×2 IMPLANT
DRAPE IMP U-DRAPE 54X76 (DRAPES) ×2 IMPLANT
DRAPE INCISE IOBAN 66X60 STRL (DRAPES) ×1 IMPLANT
DRAPE SURG 17X11 SM STRL (DRAPES) ×2 IMPLANT
DRSG AQUACEL AG ADV 3.5X10 (GAUZE/BANDAGES/DRESSINGS) ×1 IMPLANT
DRSG MEPILEX SACRM 8.7X9.8 (GAUZE/BANDAGES/DRESSINGS) ×1 IMPLANT
DURAPREP 26ML APPLICATOR (WOUND CARE) ×4 IMPLANT
ELECT REM PT RETURN 9FT ADLT (ELECTROSURGICAL) ×1
ELECTRODE REM PT RTRN 9FT ADLT (ELECTROSURGICAL) ×1 IMPLANT
GAUZE SPONGE 4X4 12PLY STRL (GAUZE/BANDAGES/DRESSINGS) ×1 IMPLANT
GLOVE BIOGEL PI IND STRL 7.5 (GLOVE) ×1 IMPLANT
GLOVE BIOGEL PI IND STRL 9 (GLOVE) ×2 IMPLANT
GLOVE BIOGEL PI ORTHO SZ9 (GLOVE) ×8 IMPLANT
GLOVE SURG SYN 7.5 E (GLOVE) ×1 IMPLANT
GLOVE SURG SYN 7.5 PF PI (GLOVE) ×1 IMPLANT
GOWN STRL REUS TWL 2XL XL LVL4 (GOWN DISPOSABLE) ×1 IMPLANT
GOWN STRL REUS W/ TWL LRG LVL3 (GOWN DISPOSABLE) ×1 IMPLANT
GOWN STRL REUS W/ TWL LRG LVL4 (GOWN DISPOSABLE) ×1 IMPLANT
GOWN STRL REUS W/ TWL XL LVL3 (GOWN DISPOSABLE) ×1 IMPLANT
GOWN STRL REUS W/TWL 2XL LVL3 (GOWN DISPOSABLE) ×1 IMPLANT
GOWN STRL REUS W/TWL LRG LVL3 (GOWN DISPOSABLE) ×1
GOWN STRL REUS W/TWL LRG LVL4 (GOWN DISPOSABLE) ×1
GOWN STRL REUS W/TWL XL LVL3 (GOWN DISPOSABLE) ×1
HOLDER FOLEY CATH W/STRAP (MISCELLANEOUS) ×1 IMPLANT
IMMBOLIZER KNEE 19 BLUE UNIV (SOFTGOODS) ×1 IMPLANT
IV NS IRRIG 3000ML ARTHROMATIC (IV SOLUTION) ×1 IMPLANT
KIT TURNOVER KIT A (KITS) ×1 IMPLANT
MANIFOLD NEPTUNE II (INSTRUMENTS) ×2 IMPLANT
NDL HYPO 22X1.5 SAFETY MO (MISCELLANEOUS) ×1 IMPLANT
NDL SAFETY ECLIP 18X1.5 (MISCELLANEOUS) ×1 IMPLANT
NDL SPNL 20GX3.5 QUINCKE YW (NEEDLE) ×1 IMPLANT
NEEDLE HYPO 22X1.5 SAFETY MO (MISCELLANEOUS) ×1 IMPLANT
NEEDLE SPNL 20GX3.5 QUINCKE YW (NEEDLE) ×1 IMPLANT
NS IRRIG 1000ML POUR BTL (IV SOLUTION) ×1 IMPLANT
PACK TOTAL KNEE (MISCELLANEOUS) ×1 IMPLANT
PAD ABD DERMACEA PRESS 5X9 (GAUZE/BANDAGES/DRESSINGS) ×2 IMPLANT
PAD WRAPON POLAR KNEE (MISCELLANEOUS) ×1 IMPLANT
PATELLA MEDIAL ATTUN 35MM KNEE (Knees) IMPLANT
PENCIL SMOKE EVACUATOR COATED (MISCELLANEOUS) ×1 IMPLANT
PIN DRILL FIX HALF THREAD (BIT) ×2 IMPLANT
PIN DRILL QUICK PACK (PIN) ×2 IMPLANT
PIN FIXATION 1/8DIA X 3INL (PIN) ×2 IMPLANT
PULSAVAC PLUS IRRIG FAN TIP (DISPOSABLE) ×1
STAPLER SKIN PROX 35W (STAPLE) ×1 IMPLANT
SUCTION TUBE FRAZIER 10FR DISP (SUCTIONS) ×1 IMPLANT
SUT ETHIBOND NAB CT1 #1 30IN (SUTURE) ×2 IMPLANT
SUT VIC AB 0 CT1 36 (SUTURE) ×1 IMPLANT
SUT VIC AB 2-0 CT1 (SUTURE) ×2 IMPLANT
SYR 20ML LL LF (SYRINGE) ×1 IMPLANT
SYR 30ML LL (SYRINGE) ×2 IMPLANT
TIP FAN IRRIG PULSAVAC PLUS (DISPOSABLE) ×1 IMPLANT
TOWER CARTRIDGE SMART MIX (DISPOSABLE) ×1 IMPLANT
TRAP FLUID SMOKE EVACUATOR (MISCELLANEOUS) ×1 IMPLANT
TRAY FOLEY MTR SLVR 16FR STAT (SET/KITS/TRAYS/PACK) ×1 IMPLANT
TUBE SUCT KAM VAC (TUBING) ×1 IMPLANT
WATER STERILE IRR 500ML POUR (IV SOLUTION) ×1 IMPLANT
WRAPON POLAR PAD KNEE (MISCELLANEOUS)

## 2023-06-05 NOTE — Evaluation (Signed)
Physical Therapy Evaluation Patient Details Name: Amanda Wang MRN: [id number] DOB: Jan 11, 1951 Today's Date: 06/05/2023  History of Present Illness  Pt is a 72 yo F diagnosed with left knee osteoarthritis and is s/p elective L TKA.  PMH includes: HTN, anxiety, depression, anemia, and DM.   Clinical Impression  Pt was pleasant and motivated to participate during the session and put forth good effort throughout. Pt required extra time, effort, and min A to manage the LLE during bed mobility tasks.  Pt demonstrated good static unsupported sitting balance at the EOB and was able to come to standing with cues for sequencing but without physical assist.  Pt was able to march and take several steps at the EOB with no overt LOB or buckling.  Pt's SpO2 and HR were both WNL on room air with pt reporting some moderate nausea after amb but no vomiting, nursing notified.  Pt will benefit from continued PT services upon discharge to safely address deficits listed in patient problem list for decreased caregiver assistance and eventual return to PLOF.         Recommendations for follow up therapy are one component of a multi-disciplinary discharge planning process, led by the attending physician.  Recommendations may be updated based on patient status, additional functional criteria and insurance authorization.  Follow Up Recommendations       Assistance Recommended at Discharge Frequent or constant Supervision/Assistance  Patient can return home with the following  A little help with walking and/or transfers;A little help with bathing/dressing/bathroom;Assistance with cooking/housework;Help with stairs or ramp for entrance;Assist for transportation    Equipment Recommendations Rolling walker (2 wheels);BSC/3in1  Recommendations for Other Services       Functional Status Assessment Patient has had a recent decline in their functional status and demonstrates the ability to make significant  improvements in function in a reasonable and predictable amount of time.     Precautions / Restrictions Precautions Precautions: Knee Required Braces or Orthoses: Knee Immobilizer - Left Knee Immobilizer - Left: Other (comment) (KI on when at rest, can be off with mobility) Restrictions Weight Bearing Restrictions: Yes LLE Weight Bearing: Weight bearing as tolerated      Mobility  Bed Mobility Overal bed mobility: Needs Assistance Bed Mobility: Supine to Sit     Supine to sit: Min assist     General bed mobility comments: Min A for LLE management    Transfers Overall transfer level: Needs assistance Equipment used: Rolling walker (2 wheels) Transfers: Sit to/from Stand Sit to Stand: Min guard           General transfer comment: Pt able to sit and stand from various height surfaces with good control and stability and with no physical assist needed, mod verbal cuing for general sequencing    Ambulation/Gait Ambulation/Gait assistance: Min guard Gait Distance (Feet): 4 Feet Assistive device: Rolling walker (2 wheels) Gait Pattern/deviations: Step-to pattern, Decreased step length - right, Decreased stance time - left, Antalgic Gait velocity: decreased     General Gait Details: Pt able to take several steps at the EOB and then from bed to chair with slow, antalgic, step-to pattern.  Steady amb with no overt LOB or L knee buckling.  Stairs            Wheelchair Mobility    Modified Rankin (Stroke Patients Only)       Balance Overall balance assessment: Needs assistance   Sitting balance-Leahy Scale: Normal     Standing balance support: Bilateral upper  extremity supported, During functional activity Standing balance-Leahy Scale: Good                               Pertinent Vitals/Pain Pain Assessment Pain Assessment: 0-10 Pain Score: 6  Pain Location: L knee Pain Descriptors / Indicators: Sore Pain Intervention(s): Repositioned, Ice  applied, Premedicated before session, Monitored during session, Patient requesting pain meds-RN notified    Home Living Family/patient expects to be discharged to:: Private residence Living Arrangements: Alone Available Help at Discharge: Family;Available 24 hours/day Type of Home: House Home Access: Stairs to enter Entrance Stairs-Rails: Right;Left;Can reach both Entrance Stairs-Number of Steps: 4   Home Layout: One level Home Equipment: Standard Walker;Rollator (4 wheels);Shower seat;Grab bars - tub/shower;Cane - single point      Prior Function Prior Level of Function : Independent/Modified Independent             Mobility Comments: Ind amb without an AD limited community distances, limited by L knee pain, drives, no fall history ADLs Comments: Ind with ADLs     Hand Dominance        Extremity/Trunk Assessment   Upper Extremity Assessment Upper Extremity Assessment: Overall WFL for tasks assessed    Lower Extremity Assessment Lower Extremity Assessment: LLE deficits/detail LLE Deficits / Details: BLE ankle strength, AROM, and sensation to light touch grossly WNL LLE Sensation: WNL       Communication   Communication: No difficulties  Cognition Arousal/Alertness: Lethargic Behavior During Therapy: WFL for tasks assessed/performed Overall Cognitive Status: Within Functional Limits for tasks assessed                                          General Comments      Exercises Total Joint Exercises Quad Sets: AROM, Strengthening, Left, 5 reps Long Arc Quad: AROM, Strengthening, Left, 5 reps Knee Flexion: AROM, Strengthening, Left, 5 reps Goniometric ROM: L knee AROM: 10-52 deg, limited by pain Marching in Standing: AROM, Strengthening, Both, 10 reps, Standing Other Exercises Other Exercises: HEP education per handout Other Exercises: L knee positioning education to promote L knee ext PROM   Assessment/Plan    PT Assessment Patient needs  continued PT services  PT Problem List Decreased strength;Decreased range of motion;Decreased activity tolerance;Decreased balance;Decreased mobility;Decreased knowledge of use of DME;Pain       PT Treatment Interventions DME instruction;Gait training;Stair training;Functional mobility training;Therapeutic activities;Therapeutic exercise;Balance training;Patient/family education    PT Goals (Current goals can be found in the Care Plan section)  Acute Rehab PT Goals Patient Stated Goal: To be able to go up and down stairs better PT Goal Formulation: With patient Time For Goal Achievement: 06/18/23 Potential to Achieve Goals: Good    Frequency BID     Co-evaluation               AM-PAC PT "6 Clicks" Mobility  Outcome Measure Help needed turning from your back to your side while in a flat bed without using bedrails?: A Little Help needed moving from lying on your back to sitting on the side of a flat bed without using bedrails?: A Little Help needed moving to and from a bed to a chair (including a wheelchair)?: A Little Help needed standing up from a chair using your arms (e.g., wheelchair or bedside chair)?: A Little Help needed to walk in hospital  room?: A Lot Help needed climbing 3-5 steps with a railing? : A Lot 6 Click Score: 16    End of Session Equipment Utilized During Treatment: Gait belt Activity Tolerance: Other (comment) (Limited by nausea) Patient left: in chair;with call bell/phone within reach Nurse Communication: Mobility status;Weight bearing status;Other (comment) (Use of KI on LLE) PT Visit Diagnosis: Other abnormalities of gait and mobility (R26.89);Muscle weakness (generalized) (M62.81);Pain Pain - Right/Left: Left Pain - part of body: Knee    Time: 4098-1191 PT Time Calculation (min) (ACUTE ONLY): 45 min   Charges:   PT Evaluation $PT Eval Moderate Complexity: 1 Mod PT Treatments $Therapeutic Exercise: 8-22 mins       D. Elly Modena PT,  DPT 06/05/23, 5:29 PM

## 2023-06-05 NOTE — H&P (Signed)
PREOPERATIVE H&P  Chief Complaint: Left Knee Osteoarthritis  HPI: Amanda Wang is a 72 y.o. female who presents for preoperative history and physical with a diagnosis of Left Knee Osteoarthritis.  Left knee pain is significantly impairing activities of daily living and ambulation.  She has failed nonoperative management wished to proceed with a left total knee arthroplasty.  Patient's left knee x-rays demonstrate tricompartmental osteoarthritis with joint space narrowing marginal osteophytes and subchondral sclerosis..   Past Medical History:  Diagnosis Date   Advanced care planning/counseling discussion 09/16/2018   Anemia    Arthritis    Chiari malformation type I (HCC)    Depression    Diabetes mellitus, type 2 (HCC)    GERD (gastroesophageal reflux disease)    History of hiatal hernia    Hyperlipidemia    Hypertension    Iron deficiency anemia    Irritable bowel syndrome with diarrhea    Osteoarthritis of left knee    PONV (postoperative nausea and vomiting)    Restless legs    Seasonal allergies    Sleep apnea    no Cpap use   Small intestinal bacterial overgrowth    Spinal stenosis    Vaccine reaction, initial encounter 03/11/2020   Past Surgical History:  Procedure Laterality Date   BREAST BIOPSY Right    neg   chiari malformation I decompression surgery  10/2005   COLONOSCOPY WITH PROPOFOL N/A 11/28/2018   Procedure: COLONOSCOPY WITH PROPOFOL;  Surgeon: Wyline Mood, MD;  Location: [facility] ENDOSCOPY;  Service: Gastroenterology;  Laterality: N/A;   COLONOSCOPY WITH PROPOFOL N/A 10/27/2021   Procedure: COLONOSCOPY WITH PROPOFOL;  Surgeon: Wyline Mood, MD;  Location: [facility] ENDOSCOPY;  Service: Gastroenterology;  Laterality: N/A;   ESOPHAGOGASTRODUODENOSCOPY N/A 10/27/2021   Procedure: ESOPHAGOGASTRODUODENOSCOPY (EGD);  Surgeon: Wyline Mood, MD;  Location: [facility] ENDOSCOPY;  Service: Gastroenterology;  Laterality: N/A;   HERNIA REPAIR     s/p gastric sleeve   KNEE  ARTHROSCOPY WITH MEDIAL MENISECTOMY Left 01/12/2022   Procedure: KNEE ARTHROSCOPY WITH MEDIAL MENISECTOMY;  Surgeon: Juanell Fairly, MD;  Location: [facility] ORS;  Service: Orthopedics;  Laterality: Left;   LAPAROSCOPIC GASTRIC SLEEVE RESECTION     LASIK Bilateral    NASAL SEPTUM SURGERY     Social History   Socioeconomic History   Marital status: Divorced    Spouse name: Not on file   Number of children: 2   Years of education: Not on file   Highest education level: Bachelor's degree (e.g., BA, AB, BS)  Occupational History   Occupation: part time   Tobacco Use   Smoking status: Former    Packs/day: 0.50    Years: 1.00    Additional pack years: 0.00    Total pack years: 0.50    Types: Cigarettes    Quit date: 07/26/1977    Years since quitting: 45.8    Passive exposure: Never   Smokeless tobacco: Never   Tobacco comments:    barley smoked when smoked   Vaping Use   Vaping Use: Never used  Substance and Sexual Activity   Alcohol use: Yes    Alcohol/week: 0.0 standard drinks of alcohol    Comment: on occasion 1-2 x month   Drug use: No   Sexual activity: Yes  Other Topics Concern   Not on file  Social History Narrative   Lives alone.   Social Determinants of Health   Financial Resource Strain: Low Risk  (10/17/2021)   Overall Financial Resource Strain (CARDIA)    Difficulty  of Paying Living Expenses: Not hard at all  Food Insecurity: No Food Insecurity (10/17/2021)   Hunger Vital Sign    Worried About Running Out of Food in the Last Year: Never true    Ran Out of Food in the Last Year: Never true  Transportation Needs: No Transportation Needs (10/17/2021)   PRAPARE - Administrator, Civil Service (Medical): No    Lack of Transportation (Non-Medical): No  Physical Activity: Inactive (10/17/2021)   Exercise Vital Sign    Days of Exercise per Week: 0 days    Minutes of Exercise per Session: 0 min  Stress: Stress Concern Present (10/17/2021)   [employer] of Occupational Health - Occupational Stress Questionnaire    Feeling of Stress : To some extent  Social Connections: Moderately Integrated (09/22/2019)   Social Connection and Isolation Panel [NHANES]    Frequency of Communication with Friends and Family: More than three times a week    Frequency of Social Gatherings with Friends and Family: More than three times a week    Attends Religious Services: More than 4 times per year    Active Member of [employer] or Organizations: Yes    Attends Engineer, structural: More than 4 times per year    Marital Status: Divorced   Family History  Problem Relation Age of Onset   Hypertension Mother    Cancer Mother    Stroke Mother    Heart disease Father    Breast cancer Sister 33   Breast cancer Paternal Aunt    Diabetes Paternal Grandmother    Allergies  Allergen Reactions   Fexofenadine Anxiety    Makes her have a panic attack   Codeine Nausea Only    Dizziness   Methylphenidate Hcl     "Caused a seizure"   Zoloft [Sertraline] Other (See Comments)    Sleepiness   Magnevist [Gadopentetate] Hives and Itching    IV Contrast   Sulfa Antibiotics Rash   Prior to Admission medications   Medication Sig Start Date End Date Taking? Authorizing Provider  albuterol (VENTOLIN HFA) 108 (90 Base) MCG/ACT inhaler INHALE 2 INHALATIONS INTO THE LUNGS EVERY 4 HOURS AS NEEDED 12/12/22  Yes [provider]  atorvastatin (LIPITOR) 40 MG tablet Take 1 tablet (40 mg total) by mouth at bedtime. 02/07/23  Yes Karamalegos, Netta Neat, DO  azelastine (ASTELIN) 0.1 % nasal spray Place 1 spray into both nostrils 2 (two) times daily. 05/15/23  Yes [provider]  busPIRone (BUSPAR) 5 MG tablet Take 1 tablet (5 mg total) by mouth 2 (two) times daily. 03/29/23 06/27/23 Yes Hisada, Barbee Cough, MD  Carboxymethylcellul-Glycerin (LUBRICATING EYE DROPS OP) Place 1 drop into both eyes daily as needed (dry eyes).   Yes [provider]   carvedilol (COREG) 6.25 MG tablet Take 1 tablet (6.25 mg total) by mouth 2 (two) times daily with a meal. 02/07/23  Yes Karamalegos, Netta Neat, DO  Cholecalciferol 125 MCG (5000 UT) TABS    Yes [provider]  cyclobenzaprine (FLEXERIL) 10 MG tablet take 1 tablet by mouth three times a day if needed for muscle spasm 02/07/23  Yes Karamalegos, Alexander J, DO  gabapentin (NEURONTIN) 600 MG tablet Take 1 tablet (600 mg total) by mouth at bedtime. 02/07/23  Yes Karamalegos, Alexander J, DO  ipratropium (ATROVENT) 0.06 % nasal spray Place 2 sprays into both nostrils 4 (four) times daily. For up to 5-7 days then stop. 03/19/23  Yes Smitty Cords, DO  levETIRAcetam (KEPPRA) 500 MG tablet Take 1 tablet (500 mg total) by mouth 2 (two) times daily. Patient taking differently: Take 500 mg by mouth at bedtime. 02/07/23  Yes Karamalegos, Netta Neat, DO  LORazepam (ATIVAN) 1 MG tablet Take 0.5-1 tablets (0.5-1 mg total) by mouth daily as needed for anxiety. 02/07/23  Yes Karamalegos, Netta Neat, DO  meloxicam (MOBIC) 7.5 MG tablet Take 7.5 mg by mouth 2 (two) times daily. 05/04/23  Yes [provider]  omeprazole (PRILOSEC) 40 MG capsule Take 1 capsule (40 mg total) by mouth daily before breakfast. Patient taking differently: Take 40 mg by mouth at bedtime. 02/07/23  Yes Karamalegos, Netta Neat, DO  valsartan (DIOVAN) 320 MG tablet Take 1 tablet (320 mg total) by mouth daily. 12/04/22  Yes Karamalegos, Netta Neat, DO  Vilazodone HCl (VIIBRYD) 40 MG TABS Take 1 tablet (40 mg total) by mouth daily. 04/08/23 07/07/23 Yes Neysa Hotter, MD  ACCU-CHEK GUIDE test strip Use to check blood sugar 1 x daily as instructed 05/21/23   Smitty Cords, DO  Accu-Chek Softclix Lancets lancets TEST TWO TO THREE TIMES DAILY DX E11.69 05/17/23   Althea Charon, Netta Neat, DO  acyclovir (ZOVIRAX) 200 MG capsule Take 200 mg by mouth 5 (five) times daily as needed (fever blisters). 12/05/21   [provider]  Blood Glucose Monitoring Suppl (ACCU-CHEK GUIDE) w/Device KIT 1 Device by Does not apply route 4 (four) times daily. DX E11.69 05/17/23   Karamalegos, Netta Neat, DO  Charcoal Activated (ACTIVATED CHARCOAL PO) Take 2 capsules by mouth as needed (gas).    [provider]  Lidocaine-Menthol (ICY HOT MAX LIDOCAINE EX) Apply 1 application. topically daily as needed (pain).    [provider]  Spacer/Aero-Holding Chambers (AEROCHAMBER PLUS WITH MASK) inhaler Use with inhaler as needed. 03/19/23   Karamalegos, Netta Neat, DO     Positive ROS: All other systems have been reviewed and were otherwise negative with the exception of those mentioned in the HPI and as above.  Physical Exam: General: Alert, no acute distress Cardiovascular: Regular rate and rhythm, no murmurs rubs or gallops.  No pedal edema Respiratory: Clear to auscultation bilaterally, no wheezes rales or rhonchi. No cyanosis, no use of accessory musculature GI: No organomegaly, abdomen is soft and non-tender nondistended with positive bowel sounds. Skin: Skin intact, no lesions within the operative field. Neurologic: Sensation intact distally Psychiatric: Patient is competent for consent with normal mood and affect Lymphatic: No cervical lymphadenopathy  MUSCULOSKELETAL: Left knee: Patient's skin is intact.  There is no erythema ecchymosis or large effusion.  Range of motion is 0 to 120 degrees.  She has no ligamentous laxity.  She has tenderness over the medial greater than lateral joint line and has patellofemoral crepitus but no grind or apprehension.  Patient has no calf tenderness or lower leg edema and is distally neurovascular intact.  Assessment: Left Knee Osteoarthritis  Plan: Plan for Procedure(s): LEFT TOTAL KNEE ARTHROPLASTY  Admit the patient in the preoperative area today.  Her family member is at the bedside.  I reviewed the details of the operation as well as the postoperative  course with them.  Answered all her questions.  Preop history and physical was performed at the bedside.  I discussed the risks and benefits of surgery. The risks include but are not limited to infection requiring the removal of the prosthesis, bleeding, nerve or blood vessel injury, joint stiffness or loss of motion, persistent pain, weakness or instability, fracture, dislocation, failure  or loosening of the prosthesis and the need for further surgery.  Patient understood these risks and wished to proceed.     Juanell Fairly, MD   06/05/2023 7:57 AM

## 2023-06-05 NOTE — Transfer of Care (Signed)
Immediate Anesthesia Transfer of Care Note  Patient: Amanda Wang  Procedure(s) Performed: Procedure(s): TOTAL KNEE ARTHROPLASTY (Left)  Patient Location: PACU  Anesthesia Type:Spinal  Level of Consciousness: awake, alert  and oriented  Airway & Oxygen Therapy: Patient Spontanous Breathing and Patient connected to face mask oxygen  Post-op Assessment: Report given to RN and Post -op Vital signs reviewed and stable  Post vital signs: Reviewed and stable  Last Vitals:  Vitals:   06/05/23 0630 06/05/23 1123  BP: (!) 166/84 (!) 144/69  Pulse: 73   Resp: 16 16  Temp:  36.6 C  SpO2: 98% 98%    Complications: No apparent anesthesia complications

## 2023-06-05 NOTE — Progress Notes (Signed)
  Subjective:  POST OP CHECK s/p left TKA.   Patient reports left knee pain as marked.  The physical therapy team is at the patient's bedside and ready to begin working with her.  Objective:   VITALS:   Vitals:   06/05/23 1530 06/05/23 1536 06/05/23 1545 06/05/23 1558  BP: (!) 156/82 (!) 156/82 (!) 165/79 (!) 156/80  Pulse: 70 71 70 72  Resp: 13 12 11 16   Temp:   (!) 97.5 F (36.4 C) 97.6 F (36.4 C)  TempSrc:    Oral  SpO2: 96% 98% 96% 95%  Weight:      Height:        PHYSICAL EXAM: Left lower extremity: Neurovascular intact Sensation intact distally Intact pulses distally Dorsiflexion/Plantar flexion intact Incision: dressing C/D/I No cellulitis present Compartment soft  LABS  Results for orders placed or performed during the hospital encounter of 06/05/23 (from the past 24 hour(s))  ABO/Rh     Status: None   Collection Time: 06/05/23  7:00 AM  Result Value Ref Range   ABO/RH(D)      A POS Performed at [facility], [address] [place], [place] [address]   Glucose, capillary     Status: Abnormal   Collection Time: 06/05/23 11:22 AM  Result Value Ref Range   Glucose-Capillary 113 (H) 70 - 99 mg/dL    DG Knee Left Port  Result Date: 06/05/2023 CLINICAL DATA:  Status post left total knee replacement. EXAM: PORTABLE LEFT KNEE - 1-2 VIEW COMPARISON:  None Available. FINDINGS: Sequelae of recent total knee arthroplasty are identified. No acute fracture or dislocation is evident. Gas is noted in the knee joint and surrounding soft tissues, and skin staples are in place. IMPRESSION: Left total knee arthroplasty without evidence of acute complication. Electronically Signed   By: Sebastian Ache M.D.   On: 06/05/2023 12:56    Assessment/Plan: Day of Surgery   Principal Problem:   S/P TKR (total knee replacement) using cement, left  I reviewed the patient's postoperative x-rays demonstrating the total knee arthroplasty components are well-positioned.  There  is no evidence of postop complication.  The patient has a Foley catheter in place which will be removed in the morning.  She will participate with physical therapy as tolerated may weight-bear as tolerated on the left lower extremity.  Patient will labs drawn in the morning.  She will continue physical therapy tomorrow.  Patient will complete 24 hours of postop antibiotics.  She will begin Lovenox tomorrow for DVT prophylaxis.  Is possible the patient may be discharged home tomorrow if pain is controlled and she is cleared by physical therapy tomorrow.    Juanell Fairly , MD 06/05/2023, 4:59 PM

## 2023-06-05 NOTE — Anesthesia Procedure Notes (Signed)
Spinal  Patient location during procedure: OR Start time: 06/05/2023 8:06 AM End time: 06/05/2023 8:14 AM Reason for block: surgical anesthesia Staffing Performed: resident/CRNA  Anesthesiologist: Reed Breech, MD Resident/CRNA: Ginger Carne, CRNA Performed by: Ginger Carne, CRNA Authorized by: Reed Breech, MD   Preanesthetic Checklist Completed: patient identified, IV checked, site marked, risks and benefits discussed, surgical consent, monitors and equipment checked, pre-op evaluation and timeout performed Spinal Block Patient position: sitting Prep: Betadine Patient monitoring: heart rate, continuous pulse ox, blood pressure and cardiac monitor Approach: midline Location: L4-5 Injection technique: single-shot Needle Needle type: Whitacre and Introducer  Needle gauge: 24 G Needle length: 9 cm Assessment Sensory level: T6 Events: CSF return Additional Notes Negative paresthesia. Negative blood return. Positive free-flowing CSF. Expiration date of kit checked and confirmed. Patient tolerated procedure well, without complications.

## 2023-06-05 NOTE — Op Note (Signed)
DATE OF SURGERY:  06/05/2023 TIME: 11:52 AM  PATIENT NAME:  Amanda Wang   AGE: 72 y.o.    PRE-OPERATIVE DIAGNOSIS:  Left Knee Osteoarthritis  POST-OPERATIVE DIAGNOSIS:  Same  PROCEDURE:  Procedure(s): LEFT TOTAL KNEE ARTHROPLASTY  SURGEON:  Juanell Fairly, MD   ASSISTANT:  Lanney Gins, PA  OPERATIVE IMPLANTS: Depuy Attune Posterior Stabilized Femural component size 4, Tibia size rotating platform component size 4, Patella polyethylene 3-peg oval button size 35, with a 7 mm, size 4 polyethylene insert.  EBL:  25 cc  TOURNIQUET TIME:  99 minutes  PREOPERATIVE INDICATIONS:  Amanda Wang is an 72 y.o. female who has a diagnosis of  Left Knee Osteoarthritis and elected for a left total knee arthroplasty after failing nonoperative treatment.  Their knee pain significantly impacts their activity of daily living.  Radiographs have demonstrated tricompartmental osteoarthritis joint space narrowing, osteophytes, and subchondral sclerosis.  The risks, benefits, and alternatives were discussed at length including but not limited to the risks of infection, bleeding, nerve or blood vessel injury, knee stiffness, fracture, dislocation, loosening or failure of the hardware and the need for further surgery. Medical risks include but not limited to DVT and pulmonary embolism, myocardial infarction, stroke, pneumonia, respiratory failure and death. I discussed these risks with the patient in my office prior to the date of surgery. They understood these risks and were willing to proceed.  OPERATIVE FINDINGS AND UNIQUE ASPECTS OF THE CASE: Tricompartmental osteoarthritis of the left knee  OPERATIVE DESCRIPTION:  The patient was brought to the operative room and placed in a supine position after undergoing placement of a spinal anesthetic.  A Foley catheter was placed.  IV antibiotics were given. Patient received Ancef 2 g IV prior to inflation of the tourniquet. The lower extremity was  prepped and draped in the usual sterile fashion.  A time out was performed to verify the patient's name, date of birth, medical record number, correct site of surgery and correct procedure to be performed. The timeout was also used to confirm the patient received antibiotics and that appropriate instruments, implants and radiographs studies were available in the room.  The leg was elevated and exsanguinated with an Esmarch and the tourniquet was inflated to 275 mmHg for 99 minutes..  A midline incision was made over the left knee. Full-thickness skin flaps were developed. A medial parapatellar arthrotomy was then made and the patella everted and the knee was brought into 90 of flexion. Hoffa's fat pad along with the cruciate ligaments and medial and lateral menisci were resected.   The distal femoral intramedullary canal was opened with a drill and the intramedullary distal femoral cutting jig was inserted into the femoral canal pinned into position. It was set at 5 degrees resecting 9 mm off the distal femur.  Care was taken to protect the collateral ligaments during distal femoral resection.  The distal femoral resection was performed with an oscillating saw. The femoral cutting guide was then removed.  The extramedullary tibial cutting guide was then placed using the anterior tibial crest and second ray of the foot as a references.  The tibial cutting guide was adjusted to allow for appropriate posterior slope.  The tibial cutting block was pinned into position. The slotted stylus was used to measure the proximal tibial resection of 10 mm off the high lateral side.  The tibial long rod alignment guide was then used to confirm position of the cutting block. A third cross pin through the tibial cutting  block was then drilled into position to allow for rotational stability. Care was taken during the tibial resection to protect the medial and collateral ligaments.  The resected tibial bone was removed along  with the posterior horns of the menisci.  The PCL was sacrificed.  Extension gap was measured with a spacer block and alignment and extension was confirmed using a long alignment rod.  The attention was then turned back to the femur. The posterior referencing distal femoral sizing guide was applied to the distal femur.  The femur was sized to be a size 4. Rotation of the referencing guide was checked with the epicondylar axis and Whitesides line. Then the 4-in-1 cutting jig was then applied to the distal femur. A stylus was used to confirm that the anterior femur would not be notched.   Then the anterior, posterior and chamfer femoral cuts were then made with an oscillating saw.  The flexion gap was then measured with a flexion spacer block and long alignment rod and was found to be symmetric with the extension gap and perpendicular to mechanical axis of the tibia.  The distal femoral preparation was completed by performing the posterior stabilized box cut using the cutting block. The entry site for the intramedullary femoral guide was filled with autologous bone graft from bone previously resected earlier in the case.  The proximal tibia plateau was then sized with trial trays. The best coverage was achieved with a size 4. This tibial tray was then pinned into position. The proximal tibia was then prepared with the reamer and keel punch.  After tibial preparation was completed, all trial components were inserted with polyethylene trials.  The knee was found to have excellent balance and full motion with a size 7 mm tibial polyethylene insert..    The attention was then turned to preparation of the patella. The thickness of the patella was measured with a caliper, the diameter measured with the patella templates.  The patella resection was then made with an oscillating saw using the patella cutting guide.  The final patellar diameter was 35 mm.  3 peg holes for the patella component were then drilled. The  trial patella was then placed. Knee was taken through a full range of motion and deemed to be stable with the trial components. All trial components were then removed. The knee capsule was then injected with Exparel.  The joint was copiously irrigated with pulse lavage.  The final total knee arthroplasty components were then cemented into place with a 7 mm trial polyethylene insert and all excess methylmethacrylate was removed.  The joint was again copiously irrigated. After the cement had hardened the knee was again taken through a full range of motion. It was felt to be most stable with the 7 mm tibial polyethylene insert. The actual tibial polyethylene insert was then placed.   The knee was taken through a range of motion and the patella tracked well and the knee was again irrigated copiously.    The medial arthrotomy was closed with #1 Ethibond. The subcutaneous tissue closed with 0 and 2-0 vicryl, and skin approximated with staples.  A dry sterile and compressive dressing was applied.  A knee immobilizer was applied.  Patient's family is bringing in her [employer] from a previous surgery.  The patient was awakened and brought to the PACU in stable and satisfactory condition.  All sharp, lap and instrument counts were correct at the conclusion the case. I spoke with the patient's family member in  the postop consultation room to let him know the case had been performed without complication and the patient was stable in recovery room.

## 2023-06-05 NOTE — Anesthesia Postprocedure Evaluation (Signed)
Anesthesia Post Note  Patient: Amanda Wang  Procedure(s) Performed: TOTAL KNEE ARTHROPLASTY (Left: Knee)  Patient location during evaluation: PACU Anesthesia Type: Spinal Level of consciousness: awake and alert, oriented and patient cooperative Pain management: pain level controlled Vital Signs Assessment: post-procedure vital signs reviewed and stable Respiratory status: spontaneous breathing, nonlabored ventilation and respiratory function stable Cardiovascular status: blood pressure returned to baseline and stable Postop Assessment: adequate PO intake, no headache, no backache and spinal receding Anesthetic complications: no   No notable events documented.   Last Vitals:  Vitals:   06/05/23 1145 06/05/23 1200  BP: (!) 151/77 (!) 173/74  Pulse: (!) 59 (!) 53  Resp: 13 16  Temp:    SpO2: 99% 97%    Last Pain:  Vitals:   06/05/23 1200  TempSrc:   PainSc: 0-No pain                 Reed Breech

## 2023-06-05 NOTE — Anesthesia Preprocedure Evaluation (Addendum)
Anesthesia Evaluation  Patient identified by MRN, date of birth, ID band Patient awake    Reviewed: Allergy & Precautions, NPO status , Patient's Chart, lab work & pertinent test results  History of Anesthesia Complications (+) PONV and history of anesthetic complications  Airway Mallampati: I   Neck ROM: Full    Dental no notable dental hx.    Pulmonary sleep apnea , former smoker (quit 1978)   Pulmonary exam normal breath sounds clear to auscultation       Cardiovascular hypertension, Normal cardiovascular exam Rhythm:Regular Rate:Normal  ECG 05/25/23: normal   Neuro/Psych  PSYCHIATRIC DISORDERS Anxiety Depression    Chiari type 1 malformation s/p repair in 2006 at [facility]; asymptomatic    GI/Hepatic hiatal hernia,GERD  ,,S/p gastric sleeve   Endo/Other  diabetes, Type 2  Obesity   Renal/GU negative Renal ROS     Musculoskeletal  (+) Arthritis ,    Abdominal   Peds  Hematology  (+) Blood dyscrasia, anemia   Anesthesia Other Findings   Reproductive/Obstetrics                             Anesthesia Physical Anesthesia Plan  ASA: 2  Anesthesia Plan: General and Spinal   Post-op Pain Management:    Induction: Intravenous  PONV Risk Score and Plan: 4 or greater and Ondansetron, Dexamethasone, Treatment may vary due to age or medical condition, Scopolamine patch - Pre-op, Propofol infusion and TIVA  Airway Management Planned: Natural Airway and Nasal Cannula  Additional Equipment:   Intra-op Plan:   Post-operative Plan:   Informed Consent: I have reviewed the patients History and Physical, chart, labs and discussed the procedure including the risks, benefits and alternatives for the proposed anesthesia with the patient or authorized representative who has indicated his/her understanding and acceptance.     Dental advisory given  Plan Discussed with: CRNA  Anesthesia Plan  Comments: (Plan for spinal and GA with natural airway, LMA/GETA backup.  Patient consented for risks of anesthesia including but not limited to:  - adverse reactions to medications - damage to eyes, teeth, lips or other oral mucosa - nerve damage due to positioning  - sore throat or hoarseness - headache, bleeding, infection, nerve damage 2/2 spinal - damage to heart, brain, nerves, lungs, other parts of body or loss of life  Informed patient about role of CRNA in peri- and intra-operative care.  Patient voiced understanding.)        Anesthesia Quick Evaluation

## 2023-06-05 NOTE — TOC Progression Note (Signed)
Transition of Care [facility]) - Progression Note    Patient Details  Name: MAZOLA KLEPPER MRN: [id number] Date of Birth: 08-31-1951  Transition of Care [facility]) CM/SW Contact  Marlowe Sax, RN Phone Number: 06/05/2023, 11:15 AM  Clinical Narrative:     Received a call from [facility] health asking for [facility] orders to resume her care, I notified the physician that the [facility] orders are needed       Expected Discharge Plan and Services                                               Social Determinants of Health (SDOH) Interventions SDOH Screenings   Food Insecurity: No Food Insecurity (10/17/2021)  Transportation Needs: No Transportation Needs (10/17/2021)  Depression (PHQ2-9): Medium Risk (02/23/2023)  Financial Resource Strain: Low Risk  (10/17/2021)  Physical Activity: Inactive (10/17/2021)  Social Connections: Moderately Integrated (09/22/2019)  Stress: Stress Concern Present (10/17/2021)  Tobacco Use: Medium Risk (06/05/2023)    Readmission Risk Interventions     No data to display

## 2023-06-05 NOTE — Anesthesia Procedure Notes (Signed)
Date/Time: 06/05/2023 8:14 AM  Performed by: Ginger Carne, CRNAPre-anesthesia Checklist: Patient identified, Emergency Drugs available, Suction available, Patient being monitored and Timeout performed Patient Re-evaluated:Patient Re-evaluated prior to induction Oxygen Delivery Method: Nasal cannula Preoxygenation: Pre-oxygenation with 100% oxygen Induction Type: IV induction

## 2023-06-06 ENCOUNTER — Observation Stay: Payer: Medicare PPO

## 2023-06-06 ENCOUNTER — Encounter: Payer: Self-pay | Admitting: Orthopedic Surgery

## 2023-06-06 DIAGNOSIS — R4182 Altered mental status, unspecified: Secondary | ICD-10-CM

## 2023-06-06 DIAGNOSIS — E1169 Type 2 diabetes mellitus with other specified complication: Secondary | ICD-10-CM | POA: Diagnosis not present

## 2023-06-06 DIAGNOSIS — F339 Major depressive disorder, recurrent, unspecified: Secondary | ICD-10-CM

## 2023-06-06 DIAGNOSIS — R410 Disorientation, unspecified: Secondary | ICD-10-CM

## 2023-06-06 DIAGNOSIS — I10 Essential (primary) hypertension: Secondary | ICD-10-CM

## 2023-06-06 LAB — URINALYSIS, ROUTINE W REFLEX MICROSCOPIC
Bilirubin Urine: NEGATIVE
Glucose, UA: NEGATIVE mg/dL
Hgb urine dipstick: NEGATIVE
Ketones, ur: NEGATIVE mg/dL
Leukocytes,Ua: NEGATIVE
Nitrite: NEGATIVE
Protein, ur: NEGATIVE mg/dL
Specific Gravity, Urine: 1.003 — ABNORMAL LOW (ref 1.005–1.030)
pH: 6 (ref 5.0–8.0)

## 2023-06-06 LAB — BASIC METABOLIC PANEL
Anion gap: 9 (ref 5–15)
BUN: 23 mg/dL (ref 8–23)
CO2: 25 mmol/L (ref 22–32)
Calcium: 7.9 mg/dL — ABNORMAL LOW (ref 8.9–10.3)
Chloride: 102 mmol/L (ref 98–111)
Creatinine, Ser: 1.14 mg/dL — ABNORMAL HIGH (ref 0.44–1.00)
GFR, Estimated: 52 mL/min — ABNORMAL LOW (ref >60)
Glucose, Bld: 118 mg/dL — ABNORMAL HIGH (ref 70–99)
Potassium: 3.6 mmol/L (ref 3.5–5.1)
Sodium: 136 mmol/L (ref 135–145)

## 2023-06-06 MED ORDER — ACETAMINOPHEN 500 MG PO TABS
ORAL_TABLET | ORAL | Status: AC
  Filled 2023-06-06: qty 2

## 2023-06-06 MED ORDER — SENNA 8.6 MG PO TABS: 1.0000 | ORAL_TABLET | Freq: Two times a day (BID) | ORAL | 0 refills | Status: DC

## 2023-06-06 MED ORDER — PANTOPRAZOLE SODIUM 40 MG PO TBEC
DELAYED_RELEASE_TABLET | ORAL | Status: AC
  Filled 2023-06-06: qty 1

## 2023-06-06 MED ORDER — SENNA 8.6 MG PO TABS
ORAL_TABLET | ORAL | Status: AC
  Filled 2023-06-06: qty 1

## 2023-06-06 MED ORDER — ENOXAPARIN SODIUM 40 MG/0.4ML IJ SOSY
PREFILLED_SYRINGE | INTRAMUSCULAR | Status: AC
  Filled 2023-06-06: qty 0.4

## 2023-06-06 MED ORDER — OXYCODONE HCL 5 MG PO TABS
ORAL_TABLET | ORAL | Status: AC
  Filled 2023-06-06: qty 2

## 2023-06-06 MED ORDER — OXYCODONE HCL 5 MG PO TABS: 5.0000 mg | ORAL_TABLET | Freq: Four times a day (QID) | ORAL | Status: DC | PRN

## 2023-06-06 MED ORDER — BISACODYL 5 MG PO TBEC: 5.0000 mg | DELAYED_RELEASE_TABLET | Freq: Every day | ORAL | 0 refills | Status: DC | PRN

## 2023-06-06 MED ORDER — ATORVASTATIN CALCIUM 20 MG PO TABS
ORAL_TABLET | ORAL | Status: AC
  Filled 2023-06-06: qty 2

## 2023-06-06 MED ORDER — GABAPENTIN 300 MG PO CAPS
ORAL_CAPSULE | ORAL | Status: AC
  Filled 2023-06-06: qty 2

## 2023-06-06 MED ORDER — ACETAMINOPHEN 325 MG PO TABS
ORAL_TABLET | ORAL | Status: AC
  Filled 2023-06-06: qty 2

## 2023-06-06 MED ORDER — CARVEDILOL 12.5 MG PO TABS
ORAL_TABLET | ORAL | Status: AC
  Filled 2023-06-06: qty 1

## 2023-06-06 MED ORDER — ONDANSETRON HCL 4 MG PO TABS
ORAL_TABLET | ORAL | Status: AC
  Filled 2023-06-06: qty 1

## 2023-06-06 MED ORDER — TRAMADOL HCL 50 MG PO TABS
ORAL_TABLET | ORAL | Status: AC
  Filled 2023-06-06: qty 1

## 2023-06-06 MED ORDER — ENOXAPARIN SODIUM 40 MG/0.4ML IJ SOSY: 40.0000 mg | PREFILLED_SYRINGE | INTRAMUSCULAR | 0 refills | Status: DC

## 2023-06-06 MED ORDER — OXYCODONE HCL 5 MG PO TABS: 5.0000 mg | ORAL_TABLET | ORAL | 0 refills | Status: DC | PRN

## 2023-06-06 NOTE — Evaluation (Signed)
Occupational Therapy Evaluation Patient Details Name: Amanda Wang MRN: [id number] DOB: [birth year] Today's Date: 06/06/2023   History of Present Illness Pt is a 72 yo F diagnosed with left knee osteoarthritis and is s/p elective L TKA.  PMH includes: HTN, anxiety, depression, anemia, and DM.   Clinical Impression   Amanda Wang was seen for OT evaluation this date, POD#1 from above surgery. Pt was independent in all ADLs prior to surgery, however occasionally using her RW for functional mobility 2/2 L Knee pain. Pt presents pleasantly confused, per care team this is a change from her cognition earlier this date. RN/MD aware. Pt reports she is eager to return to PLOF with less pain and improved safety and independence. Pt currently requires minimal assist for LB dressing while in seated position due to pain and limited AROM of L knee. Pt instructed in polar care mgt, falls prevention strategies, home/routines modifications, DME/AE for LB bathing and dressing tasks, and compression stocking mgt. Pt would benefit from skilled OT services including additional instruction in dressing techniques with or without assistive devices for dressing and bathing skills to support recall and carryover prior to discharge and ultimately to maximize safety, independence, and minimize falls risk and caregiver burden. Do not currently anticipate any OT needs following this hospitalization.         Recommendations for follow up therapy are one component of a multi-disciplinary discharge planning process, led by the attending physician.  Recommendations may be updated based on patient status, additional functional criteria and insurance authorization.   Assistance Recommended at Discharge Intermittent Supervision/Assistance  Patient can return home with the following A little help with walking and/or transfers;A little help with bathing/dressing/bathroom;Help with stairs or ramp for entrance;Assist for  transportation;Assistance with cooking/housework    Functional Status Assessment  Patient has had a recent decline in their functional status and demonstrates the ability to make significant improvements in function in a reasonable and predictable amount of time.  Equipment Recommendations  BSC/3in1    Recommendations for Other Services       Precautions / Restrictions Precautions Precautions: Knee Required Braces or Orthoses: Knee Immobilizer - Left Knee Immobilizer - Left: Other (comment) (KI on with rest, can be off for mobility) Restrictions Weight Bearing Restrictions: Yes LLE Weight Bearing: Weight bearing as tolerated      Mobility Bed Mobility Overal bed mobility: Needs Assistance             General bed mobility comments: NT up in recliner at start/end of session.    Transfers Overall transfer level: Needs assistance Equipment used: Rolling walker (2 wheels) Transfers: Sit to/from Stand Sit to Stand: Supervision                  Balance Overall balance assessment: Needs assistance   Sitting balance-Leahy Scale: Good     Standing balance support: Bilateral upper extremity supported, During functional activity, Reliant on assistive device for balance Standing balance-Leahy Scale: Good                             ADL either performed or assessed with clinical judgement   ADL Overall ADL's : Needs assistance/impaired                                       General ADL Comments: Pt functionally limited by decreased cognition, decreased  AROM of her LLE, and increased pain with mobility. Able to perform functional transfers with SUPERVISION for safety and RW. Requires MAX A to doff/don sock on operative extremity.     Vision Patient Visual Report: No change from baseline       Perception     Praxis      Pertinent Vitals/Pain Pain Assessment Pain Assessment: Faces Faces Pain Scale: Hurts little more Pain Location:  L knee with mobility Pain Descriptors / Indicators: Sore, Aching Pain Intervention(s): Limited activity within patient's tolerance, Repositioned, Premedicated before session     Hand Dominance     Extremity/Trunk Assessment Upper Extremity Assessment Upper Extremity Assessment: Overall WFL for tasks assessed   Lower Extremity Assessment Lower Extremity Assessment: Defer to PT evaluation;LLE deficits/detail LLE Deficits / Details: s/p L TKA, WBAT, KI on while at rest.       Communication Communication Communication: No difficulties   Cognition Arousal/Alertness: Awake/alert, Suspect due to medications Behavior During Therapy: WFL for tasks assessed/performed Overall Cognitive Status: Impaired/Different from baseline Area of Impairment: Orientation, Following commands, Memory, Safety/judgement                 Orientation Level: Disoriented to, Time, Situation   Memory: Decreased recall of precautions, Decreased short-term memory Following Commands: Follows one step commands inconsistently, Follows one step commands with increased time Safety/Judgement: Decreased awareness of safety, Decreased awareness of deficits     General Comments: Pt notably confused during session. Has difficulty following VC's and completing scentences. Often brings up unrelated topics, but is easily re-directed. Unable to provide date/situation information without additional cueing. Complains of "brain fog" t/o session.     General Comments  VSS t/o session. BP 134/67, MAP 79, spO2 93% on RA, HR 78 after functional activity. RN in room to assess 2/2 impaired cognition from this AM.    Exercises Other Exercises Other Exercises: Pt educated in falls prevention strategies, safe use of AE/DME for LB ADL management, compression stocking management, polar care management, complementary alternative methods for pain management including gentle self-massage and distraction techniques, and routines  modifications to support safety and fxl independence during meaningful occupations of daily life.   Shoulder Instructions      Home Living Family/patient expects to be discharged to:: Private residence Living Arrangements: Alone Available Help at Discharge: Family;Available 24 hours/day Type of Home: House Home Access: Stairs to enter [employer] of Steps: 4 Entrance Stairs-Rails: Right;Left;Can reach both Home Layout: One level     Bathroom Shower/Tub: Chief Strategy Officer: Standard     Home Equipment: Retail banker (4 wheels);Shower seat;Grab bars - tub/shower;Cane - single point          Prior Functioning/Environment Prior Level of Function : Independent/Modified Independent;Driving             Mobility Comments: Ind amb without an AD limited community distances, limited by L knee pain, drives, no fall history ADLs Comments: Active, independent with ADL management, driving.        OT Problem List: Decreased strength;Decreased coordination;Decreased range of motion;Decreased cognition;Decreased activity tolerance;Decreased safety awareness;Impaired balance (sitting and/or standing);Decreased knowledge of use of DME or AE;Decreased knowledge of precautions      OT Treatment/Interventions: Therapeutic exercise;Self-care/ADL training;Therapeutic activities;DME and/or AE instruction;Patient/family education;Balance training    OT Goals(Current goals can be found in the care plan section) Acute Rehab OT Goals Patient Stated Goal: To go home OT Goal Formulation: With patient Time For Goal Achievement: 06/20/23 Potential to Achieve Goals: Good  ADL Goals Pt Will Perform Lower Body Dressing: with adaptive equipment;with modified independence;sit to/from stand Pt Will Transfer to Toilet: bedside commode;ambulating;with modified independence Pt Will Perform Toileting - Clothing Manipulation and hygiene: sit to/from stand;with modified  independence;with adaptive equipment  OT Frequency: Min 2X/week    Co-evaluation              AM-PAC OT "6 Clicks" Daily Activity     Outcome Measure Help from another person eating meals?: None Help from another person taking care of personal grooming?: None Help from another person toileting, which includes using toliet, bedpan, or urinal?: A Little Help from another person bathing (including washing, rinsing, drying)?: A Little Help from another person to put on and taking off regular upper body clothing?: A Little Help from another person to put on and taking off regular lower body clothing?: A Little 6 Click Score: 20   End of Session Equipment Utilized During Treatment: Rolling walker (2 wheels);Gait belt;Left knee immobilizer Nurse Communication: Other (comment) (cognition)  Activity Tolerance: Patient tolerated treatment well Patient left: in chair;with call bell/phone within reach;with chair alarm set  OT Visit Diagnosis: Other abnormalities of gait and mobility (R26.89)                Time: 1610-9604 OT Time Calculation (min): 30 min Charges:  OT General Charges $OT Visit: 1 Visit OT Evaluation $OT Eval Low Complexity: 1 Low OT Treatments $Self Care/Home Management : 8-22 mins  Rockney Ghee, M.S., OTR/L 06/06/23, 3:37 PM

## 2023-06-06 NOTE — Progress Notes (Signed)
Physical Therapy Treatment Patient Details Name: Amanda Wang MRN: [id number] DOB: Jun 08, 1951 Today's Date: 06/06/2023   History of Present Illness Pt is a 72 yo F diagnosed with left knee osteoarthritis and is s/p elective L TKA.  PMH includes: HTN, anxiety, depression, anemia, and DM.    PT Comments    Pt pleasant and motivated for therapy, sitting in recliner upon arrival. Pt reported feeling moderate dizziness symptoms upon standing at start of session. BP in recliner (138/66), BP in standing (146/63). Pt also demonstrated a greater level of confusion throughout the session, frequently bringing up or answering questions with unrelated topics. She also struggled to follow verbal commands throughout the session. No major changes in her mobility status since this morning. She was able to ambulate ~100 feet with min guard and RW for safety, still demonstrating a slow cadence but able to utilize step-through pattern. Pt able to navigate 4 steps using bilateral rails with increased verbal cues needed for sequencing compared to prior session. Pt left in recliner with OT in room at end of session. Pt will benefit from continued PT services upon discharge to safely address deficits listed in patient problem list for decreased caregiver assistance and eventual return to PLOF.    Recommendations for follow up therapy are one component of a multi-disciplinary discharge planning process, led by the attending physician.  Recommendations may be updated based on patient status, additional functional criteria and insurance authorization.  Follow Up Recommendations       Assistance Recommended at Discharge Frequent or constant Supervision/Assistance  Patient can return home with the following A little help with walking and/or transfers;A little help with bathing/dressing/bathroom;Assistance with cooking/housework;Help with stairs or ramp for entrance;Assist for transportation   Equipment Recommendations   Rolling walker (2 wheels);BSC/3in1    Recommendations for Other Services       Precautions / Restrictions Precautions Precautions: Knee Required Braces or Orthoses: Knee Immobilizer - Left Knee Immobilizer - Left: Other (comment) (KI on with rest, off for mobility) Restrictions Weight Bearing Restrictions: Yes LLE Weight Bearing: Weight bearing as tolerated     Mobility  Bed Mobility Overal bed mobility: Needs Assistance Bed Mobility: Supine to Sit     Supine to sit: Min guard     General bed mobility comments: NT up in recliner at start/end of session.    Transfers Overall transfer level: Needs assistance Equipment used: Rolling walker (2 wheels) Transfers: Sit to/from Stand Sit to Stand: Min guard           General transfer comment: Pt able to stand from chair with no physical assistance, min guard for safety. Occasional verbal cues for correct LLE placement.    Ambulation/Gait Ambulation/Gait assistance: Min guard Gait Distance (Feet): 100 Feet Assistive device: Rolling walker (2 wheels) Gait Pattern/deviations: Step-through pattern, Decreased stance time - left, Antalgic, Decreased weight shift to left, Decreased step length - right Gait velocity: decreased     General Gait Details: Pt able to ambulate 100 feet with min guard. Limited by confusion/impaired cognition. No instances of L knee buckling while walking.   Stairs Stairs: Yes Stairs assistance: Min guard Stair Management: Two rails, Step to pattern, Forwards Number of Stairs: 4 General stair comments: Pt demonstrated good eccentric/concentric control, no instances of L knee buckling. Verbal cues for L quad activation and placement of UE's on rails; pt struggled to follow VC's for UE placement and sequencing.   Wheelchair Mobility    Modified Rankin (Stroke Patients Only)  Balance Overall balance assessment: Needs assistance Sitting-balance support: Feet supported, No upper extremity  supported Sitting balance-Leahy Scale: Good     Standing balance support: During functional activity, Reliant on assistive device for balance, Single extremity supported Standing balance-Leahy Scale: Poor Standing balance comment: Pt able to stand ~1-2 mins with single UE support on RW during standing BP. Pt unable to tolerate standing with no UE support, demonstrated frequent LOB requiring her to catch herself on the wall or RW.                            Cognition Arousal/Alertness: Awake/alert, Suspect due to medications Behavior During Therapy: WFL for tasks assessed/performed Overall Cognitive Status: Impaired/Different from baseline Area of Impairment: Following commands, Safety/judgement                       Following Commands: Follows one step commands inconsistently, Follows one step commands with increased time       General Comments: Pt seemingly more confused during session compared to this morning. Struggled to follow commands and cues, often answered questions with unrelated topics.        Exercises Total Joint Exercises Other Exercises Other Exercises: L knee positioning education to promote L knee ext PROM    General Comments General comments (skin integrity, edema, etc.): VSS t/o session. BP 134/67, MAP 79, spO2 93% on RA, HR 78 after functional activity. RN in room to assess 2/2 impaired cognition from this AM.      Pertinent Vitals/Pain Pain Assessment Pain Score: 8  Pain Location: L knee with mobility Pain Descriptors / Indicators: Sore, Aching Pain Intervention(s): Premedicated before session, Repositioned, Monitored during session    Home Living Family/patient expects to be discharged to:: Private residence Living Arrangements: Alone Available Help at Discharge: Family;Available 24 hours/day Type of Home: House Home Access: Stairs to enter Entrance Stairs-Rails: Right;Left;Can reach both Entrance Stairs-Number of Steps: 4    Home Layout: One level Home Equipment: Standard Walker;Rollator (4 wheels);Shower seat;Grab bars - tub/shower;Cane - single point      Prior Function            PT Goals (current goals can now be found in the care plan section) Progress towards PT goals: Progressing toward goals    Frequency    BID      PT Plan Current plan remains appropriate    Co-evaluation              AM-PAC PT "6 Clicks" Mobility   Outcome Measure  Help needed turning from your back to your side while in a flat bed without using bedrails?: A Little Help needed moving from lying on your back to sitting on the side of a flat bed without using bedrails?: A Little Help needed moving to and from a bed to a chair (including a wheelchair)?: A Little Help needed standing up from a chair using your arms (e.g., wheelchair or bedside chair)?: A Little Help needed to walk in hospital room?: A Little Help needed climbing 3-5 steps with a railing? : A Little 6 Click Score: 18    End of Session Equipment Utilized During Treatment: Gait belt Activity Tolerance: Patient tolerated treatment well Patient left: in chair;with call bell/phone within reach;Other (comment) (Pt handed off to OT at end of session) Nurse Communication: Mobility status;Weight bearing status PT Visit Diagnosis: Other abnormalities of gait and mobility (R26.89);Muscle weakness (generalized) (M62.81);Pain Pain - Right/Left:  Left Pain - part of body: Knee     Time: 1347-1415 PT Time Calculation (min) (ACUTE ONLY): 28 min  Charges:  $Gait Training: 8-22 mins $Therapeutic Exercise: 8-22 mins $Therapeutic Activity: 8-22 mins                     Johnthomas Lader, SPT 06/06/23, 4:17 PM

## 2023-06-06 NOTE — Assessment & Plan Note (Signed)
Patient presenting for total knee arthroplasty found to be confused today by physical therapy.  Discussed with patient's nurse and daughter.  Daughter states that yesterday she seemed possibly slightly confused but she is markedly different today.  On exam, no focal findings other than responses to questions are nonsensical and tangential.  Primary differential is delirium, however given risk factors for CVA, will complete workup.  - CT of the head  - BMP  - Urinalysis - Delirium precautions, reduction of centrally acting medications is much as possible

## 2023-06-06 NOTE — Assessment & Plan Note (Signed)
-   Continue home irbesartan and carvedilol

## 2023-06-06 NOTE — Assessment & Plan Note (Addendum)
-   Continue home buspirone and the Vilazodone

## 2023-06-06 NOTE — Assessment & Plan Note (Deleted)
Patient is s/p total knee replacement postop day 1.  - Management per primary team

## 2023-06-06 NOTE — Progress Notes (Signed)
  Subjective:  POD #1 s/p left TKA.   Patient reports left knee pain as moderate.  Patient did well this AM with PT and is awaiting another session this afternoon.  Objective:   VITALS:   Vitals:   06/05/23 2300 06/06/23 0519 06/06/23 0728 06/06/23 1254  BP: (!) 142/72 (!) 151/70 (!) 156/74 (!) 142/69  Pulse: 66 72 67 73  Resp: 16 16 17 17   Temp: 97.9 F (36.6 C) 98.3 F (36.8 C) 97.6 F (36.4 C) 98.6 F (37 C)  TempSrc: Oral Oral    SpO2: 94% 95% 97% 97%  Weight:      Height:        PHYSICAL EXAM: Left lower extremity: I personally removed the patient's outer dressing today.  The Aquacel dressing has minimal sanguinous drainage.  There is no erythema or ecchymosis.  Patient has a mild hemarthrosis. Neurovascular intact Sensation intact distally Intact pulses distally Dorsiflexion/Plantar flexion intact No cellulitis present Compartment soft  LABS  No results found for this or any previous visit (from the past 24 hour(s)).  DG Knee Left Port  Result Date: 06/05/2023 CLINICAL DATA:  Status post left total knee replacement. EXAM: PORTABLE LEFT KNEE - 1-2 VIEW COMPARISON:  None Available. FINDINGS: Sequelae of recent total knee arthroplasty are identified. No acute fracture or dislocation is evident. Gas is noted in the knee joint and surrounding soft tissues, and skin staples are in place. IMPRESSION: Left total knee arthroplasty without evidence of acute complication. Electronically Signed   By: Sebastian Ache M.D.   On: 06/05/2023 12:56    Assessment/Plan: 1 Day Post-Op   Principal Problem:   S/P TKR (total knee replacement) using cement, left  Patient stable postop.  Pain is improving.  Patient will be discharged home today and will take Lovenox 40 mg daily for DVT prophylaxis until follow-up in 2 weeks.  Patient will perform outpatient physical therapy in our office beginning next week.  She may weight-bear as tolerated on the left lower extremity.  She will continue  elevation and Polar Care to reduce swelling until follow-up.    Juanell Fairly , MD 06/06/2023, 1:18 PM

## 2023-06-06 NOTE — Progress Notes (Signed)
Patient is not able to walk the distance required to go the bathroom, or he/she is unable to safely negotiate stairs required to access the bathroom.  A 3in1 BSC will alleviate this problem  

## 2023-06-06 NOTE — Discharge Summary (Signed)
Physician Discharge Summary  Patient ID: Amanda Wang MRN: [id number] DOB/AGE: [birth year] 72 y.o.  Admit date: 06/05/2023 Discharge date: 06/06/2023  Admission Diagnoses:  Left Knee Osteoarthritis S/P TKR (total knee replacement) using cement, left  Discharge Diagnoses:  Left Knee Osteoarthritis Principal Problem:   S/P TKR (total knee replacement) using cement, left   Past Medical History:  Diagnosis Date   Advanced care planning/counseling discussion 09/16/2018   Anemia    Arthritis    Chiari malformation type I (HCC)    Depression    Diabetes mellitus, type 2 (HCC)    GERD (gastroesophageal reflux disease)    History of hiatal hernia    Hyperlipidemia    Hypertension    Iron deficiency anemia    Irritable bowel syndrome with diarrhea    Osteoarthritis of left knee    PONV (postoperative nausea and vomiting)    Restless legs    Seasonal allergies    Sleep apnea    no Cpap use   Small intestinal bacterial overgrowth    Spinal stenosis    Vaccine reaction, initial encounter 03/11/2020    Surgeries: Procedure(s): TOTAL KNEE ARTHROPLASTY on 06/05/2023   Consultants (if any): Treatment Team:  Juanell Fairly, MD  Discharged Condition: Improved  Hospital Course: Amanda Wang is an 72 y.o. female who was admitted 06/05/2023 with a diagnosis of  Left Knee Osteoarthritis S/P TKR (total knee replacement) using cement, left and went to the operating room on 06/05/2023 and underwent an uncomplicated left total knee arthroplasty.    She was given perioperative antibiotics:  Anti-infectives (From admission, onward)    Start     Dose/Rate Route Frequency Ordered Stop   06/05/23 1345  ceFAZolin (ANCEF) IVPB 2g/100 mL premix        2 g 200 mL/hr over 30 Minutes Intravenous Every 6 hours 06/05/23 1343 06/05/23 2050   06/05/23 0600  ceFAZolin (ANCEF) IVPB 2g/100 mL premix        2 g 200 mL/hr over 30 Minutes Intravenous On call to O.R. 06/04/23 2150 06/05/23 [id number]      .  She was given sequential compression devices, early ambulation, and lovenox for DVT prophylaxis.  She benefited maximally from the hospital stay and there were no complications.    Recent vital signs:  Vitals:   06/06/23 0728 06/06/23 1254  BP: (!) 156/74 (!) 142/69  Pulse: 67 73  Resp: 17 17  Temp: 97.6 F (36.4 C) 98.6 F (37 C)  SpO2: 97% 97%    Recent laboratory studies:  Lab Results  Component Value Date   HGB 11.8 (L) 06/04/2023   HGB 10.9 (L) 05/25/2023   HGB 8.5 (L) 02/20/2023   Lab Results  Component Value Date   WBC 7.2 06/04/2023   PLT 329 06/04/2023   Lab Results  Component Value Date   INR 1.2 05/25/2023   Lab Results  Component Value Date   NA 142 05/25/2023   K 3.4 (L) 05/25/2023   CL 107 05/25/2023   CO2 28 05/25/2023   BUN 22 05/25/2023   CREATININE 0.79 05/25/2023   GLUCOSE 100 (H) 05/25/2023    Discharge Medications:   Allergies as of 06/06/2023       Reactions   Fexofenadine Anxiety   Makes her have a panic attack   Codeine Nausea Only   Dizziness   Methylphenidate Hcl    "Caused a seizure"   Zoloft [sertraline] Other (See Comments)   Sleepiness   Magnevist [gadopentetate] Hives, Itching  IV Contrast   Sulfa Antibiotics Rash        Medication List     STOP taking these medications    meloxicam 7.5 MG tablet Commonly known as: MOBIC       TAKE these medications    Accu-Chek Guide test strip Generic drug: glucose blood Use to check blood sugar 1 x daily as instructed   Accu-Chek Guide w/Device Kit 1 Device by Does not apply route 4 (four) times daily. DX E11.69   Accu-Chek Softclix Lancets lancets TEST TWO TO THREE TIMES DAILY DX E11.69   ACTIVATED CHARCOAL PO Take 2 capsules by mouth as needed (gas).   acyclovir 200 MG capsule Commonly known as: ZOVIRAX Take 200 mg by mouth 5 (five) times daily as needed (fever blisters).   aerochamber plus with mask inhaler Use with inhaler as needed.   albuterol  108 (90 Base) MCG/ACT inhaler Commonly known as: VENTOLIN HFA INHALE 2 INHALATIONS INTO THE LUNGS EVERY 4 HOURS AS NEEDED   atorvastatin 40 MG tablet Commonly known as: LIPITOR Take 1 tablet (40 mg total) by mouth at bedtime.   azelastine 0.1 % nasal spray Commonly known as: ASTELIN Place 1 spray into both nostrils 2 (two) times daily.   bisacodyl 5 MG EC tablet Commonly known as: DULCOLAX Take 1 tablet (5 mg total) by mouth daily as needed for moderate constipation.   busPIRone 5 MG tablet Commonly known as: BUSPAR Take 1 tablet (5 mg total) by mouth 2 (two) times daily.   carvedilol 6.25 MG tablet Commonly known as: COREG Take 1 tablet (6.25 mg total) by mouth 2 (two) times daily with a meal.   Cholecalciferol 125 MCG (5000 UT) Tabs   cyclobenzaprine 10 MG tablet Commonly known as: FLEXERIL take 1 tablet by mouth three times a day if needed for muscle spasm   enoxaparin 40 MG/0.4ML injection Commonly known as: LOVENOX Inject 0.4 mLs (40 mg total) into the skin daily. Start taking on: June 07, 2023   gabapentin 600 MG tablet Commonly known as: NEURONTIN Take 1 tablet (600 mg total) by mouth at bedtime.   ICY HOT MAX LIDOCAINE EX Apply 1 application. topically daily as needed (pain).   ipratropium 0.06 % nasal spray Commonly known as: ATROVENT Place 2 sprays into both nostrils 4 (four) times daily. For up to 5-7 days then stop.   levETIRAcetam 500 MG tablet Commonly known as: KEPPRA Take 1 tablet (500 mg total) by mouth 2 (two) times daily. What changed: when to take this   LORazepam 1 MG tablet Commonly known as: ATIVAN Take 0.5-1 tablets (0.5-1 mg total) by mouth daily as needed for anxiety.   LUBRICATING EYE DROPS OP Place 1 drop into both eyes daily as needed (dry eyes).   omeprazole 40 MG capsule Commonly known as: PRILOSEC Take 1 capsule (40 mg total) by mouth daily before breakfast. What changed: when to take this   oxyCODONE 5 MG immediate  release tablet Commonly known as: Oxy IR/ROXICODONE Take 1 tablet (5 mg total) by mouth every 4 (four) hours as needed for moderate pain (pain score 4-6).   senna 8.6 MG Tabs tablet Commonly known as: SENOKOT Take 1 tablet (8.6 mg total) by mouth 2 (two) times daily.   valsartan 320 MG tablet Commonly known as: DIOVAN Take 1 tablet (320 mg total) by mouth daily.   Vilazodone HCl 40 MG Tabs Commonly known as: VIIBRYD Take 1 tablet (40 mg total) by mouth daily.  Durable Medical Equipment  (From admission, onward)           Start     Ordered   06/05/23 1733  For home use only DME Walker rolling  Once       Question Answer Comment  Walker: With 5 Inch Wheels   Patient needs a walker to treat with the following condition Difficulty walking      06/05/23 1732   06/05/23 1733  For home use only DME Bedside commode  Once       Question:  Patient needs a bedside commode to treat with the following condition  Answer:  Impaired mobility   06/05/23 1733            Diagnostic Studies: DG Knee Left Port  Result Date: 06/05/2023 CLINICAL DATA:  Status post left total knee replacement. EXAM: PORTABLE LEFT KNEE - 1-2 VIEW COMPARISON:  None Available. FINDINGS: Sequelae of recent total knee arthroplasty are identified. No acute fracture or dislocation is evident. Gas is noted in the knee joint and surrounding soft tissues, and skin staples are in place. IMPRESSION: Left total knee arthroplasty without evidence of acute complication. Electronically Signed   By: Sebastian Ache M.D.   On: 06/05/2023 12:56    Disposition: Discharge disposition: 01-Home or Self Care       Discharge Instructions     Call MD / Call 911   Complete by: As directed    If you experience chest pain or shortness of breath, CALL 911 and be transported to the hospital emergency room.  If you develope a fever above 101 F, pus (white drainage) or increased drainage or redness at the wound, or  calf pain, call your surgeon's office.   Constipation Prevention   Complete by: As directed    Drink plenty of fluids.  Prune juice may be helpful.  You may use a stool softener, such as Colace (over the counter) 100 mg twice a day.  Use MiraLax (over the counter) for constipation as needed.   Diet general   Complete by: As directed    Discharge instructions   Complete by: As directed    Follow up with Dr. Martha Clan in the office on 06-20-2023 @ 3:45 PM  First post-op Physical therapy visit is on 06-11-2023 @ 10:45 AM with Danny Lawless, PT  The patient may continue to bear weight on the left lower extremity with use of a walker. The patient should continue to use TED stockings until follow-up. Patient should remove the TED stockings at night for sleep. The patient needs to continue to elevate the left lower extremity whenever possible. The knee immobilizer should be used at night. The patient may remove the knee immobilizer to perform exercises or sit in a chair during the day.  Patient should not place a pillow under their knee. The Polar Care may be used by the patient for comfort.  The dressing should remain on until follow up in the office.   The patient must cover the left knee dressing/incision during showers with a plastic bag or Saran wrap.  The patient will take lovenox 40 mg once a day for blood clot prevention and continue to work on knee range of motion exercises at home as instructed by physical therapy until follow-up in the office.   Driving restrictions   Complete by: As directed    No driving until off of all narcotic pain medication.   Increase activity slowly as tolerated   Complete by: As  directed    Lifting restrictions   Complete by: As directed    No lifting for 12-16 weeks   Post-operative opioid taper instructions:   Complete by: As directed    POST-OPERATIVE OPIOID TAPER INSTRUCTIONS: It is important to wean off of your opioid medication as soon as possible. If  you do not need pain medication after your surgery it is ok to stop day one. Opioids include: Codeine, Hydrocodone(Norco, Vicodin), Oxycodone(Percocet, oxycontin) and hydromorphone amongst others.  Long term and even short term use of opiods can cause: Increased pain response Dependence Constipation Depression Respiratory depression And more.  Withdrawal symptoms can include Flu like symptoms Nausea, vomiting And more Techniques to manage these symptoms Hydrate well Eat regular healthy meals Stay active Use relaxation techniques(deep breathing, meditating, yoga) Do Not substitute Alcohol to help with tapering If you have been on opioids for less than two weeks and do not have pain than it is ok to stop all together.  Plan to wean off of opioids This plan should start within one week post op of your joint replacement. Maintain the same interval or time between taking each dose and first decrease the dose.  Cut the total daily intake of opioids by one tablet each day Next start to increase the time between doses. The last dose that should be eliminated is the evening dose.             Signed: Juanell Fairly ,MD 06/06/2023, 1:33 PM

## 2023-06-06 NOTE — Consult Note (Signed)
Initial Consultation Note   Patient: Amanda Wang ZOX:[id number] DOB: 27-Dec-1950 PCP: Smitty Cords, DO DOA: 06/05/2023 DOS: the patient was seen and examined on 06/06/2023 Primary service: Juanell Fairly, MD  Referring physician: Dr. Martha Clan Reason for consult: Altered Mental Status   Assessment/Plan: Assessment and Plan: Altered mental status Patient presenting for total knee arthroplasty found to be confused today by physical therapy.  Discussed with patient's nurse and daughter.  Daughter states that yesterday she seemed possibly slightly confused but she is markedly different today.  On exam, no focal findings other than responses to questions are nonsensical and tangential.  Primary differential is delirium, however given risk factors for CVA, will complete workup.  - CT of the head  - BMP  - Urinalysis - Delirium precautions, reduction of centrally acting medications is much as possible  Essential hypertension - Continue home irbesartan and carvedilol  Type 2 diabetes mellitus with other specified complication (HCC) Per chart review, patient has a history of type 2 diabetes that is currently well-controlled with metformin only.  Last A1c of 6.2%.  CBG are well-controlled at this time  - Continue CBG monitoring without insulin  Depression, recurrent (HCC) - Continue home buspirone and the Vilazodone - Hold home Ativan as patient seems to use it infrequently and is at very low risk for withdrawal   TR[facility] will continue to follow the patient.  HPI: Amanda Wang is a 72 y.o. female with past medical history of type 2 diabetes, hypertension, hyperlipidemia, depression, iron deficiency anemia, who has been admitted to [facility] for total knee arthroplasty complicated by altered mental status.  History obtained from patient's daughter [name]) at bedside and from patient's RN.  Patient's daughter states that yesterday, patient seemed to to be slightly confused and  distractible yesterday.  Today when her ex-husband visited, he noted a significant difference in her, and called [name] to come see her immediately.  [name] states that she occasionally seems to have difficulty finding her words but otherwise just seems to have a hard time keeping her thoughts straight.  At this time, [name] denies any chest pain, shortness of breath, nausea, vomiting, abdominal pain.  She denies any focal weakness.  Hospital course: On arrival to the hospital, patient's blood pressure was elevated at 183/76 with heart rate of 72.  Most recent blood pressure 127/62 with heart rate of 78.  She was afebrile at 99.  She was saturating at 93% on room air.  No blood work obtained today.  Left knee x-ray demonstrated left total knee arthroplasty without evidence of complication.  Due to altered mental status noted by physical therapy, [facility] consulted for evaluation.  Review of Systems: As mentioned in the history of present illness. All other systems reviewed and are negative.  Past Medical History:  Diagnosis Date   Advanced care planning/counseling discussion 09/16/2018   Anemia    Arthritis    Chiari malformation type I (HCC)    Depression    Diabetes mellitus, type 2 (HCC)    GERD (gastroesophageal reflux disease)    History of hiatal hernia    Hyperlipidemia    Hypertension    Iron deficiency anemia    Irritable bowel syndrome with diarrhea    Osteoarthritis of left knee    PONV (postoperative nausea and vomiting)    Restless legs    Seasonal allergies    Sleep apnea    no Cpap use   Small intestinal bacterial overgrowth    Spinal stenosis  Vaccine reaction, initial encounter 03/11/2020   Past Surgical History:  Procedure Laterality Date   BREAST BIOPSY Right    neg   chiari malformation I decompression surgery  10/2005   COLONOSCOPY WITH PROPOFOL N/A 11/28/2018   Procedure: COLONOSCOPY WITH PROPOFOL;  Surgeon: Wyline Mood, MD;  Location: [facility] ENDOSCOPY;   Service: Gastroenterology;  Laterality: N/A;   COLONOSCOPY WITH PROPOFOL N/A 10/27/2021   Procedure: COLONOSCOPY WITH PROPOFOL;  Surgeon: Wyline Mood, MD;  Location: [facility] ENDOSCOPY;  Service: Gastroenterology;  Laterality: N/A;   ESOPHAGOGASTRODUODENOSCOPY N/A 10/27/2021   Procedure: ESOPHAGOGASTRODUODENOSCOPY (EGD);  Surgeon: Wyline Mood, MD;  Location[facility] ENDOSCOPY;  Service: Gastroenterology;  Laterality: N/A;   HERNIA REPAIR     s/p gastric sleeve   KNEE ARTHROSCOPY WITH MEDIAL MENISECTOMY Left 01/12/2022   Procedure: KNEE ARTHROSCOPY WITH MEDIAL MENISECTOMY;  Surgeon: Juanell Fairly, MD;  Location: [facility] ORS;  Service: Orthopedics;  Laterality: Left;   LAPAROSCOPIC GASTRIC SLEEVE RESECTION     LASIK Bilateral    NASAL SEPTUM SURGERY     TOTAL KNEE ARTHROPLASTY Left 06/05/2023   Procedure: TOTAL KNEE ARTHROPLASTY;  Surgeon: Juanell Fairly, MD;  Location: [facility] ORS;  Service: Orthopedics;  Laterality: Left;   Social History:  reports that she quit smoking about 45 years ago. Her smoking use included cigarettes. She has a 0.50 pack-year smoking history. She has never been exposed to tobacco smoke. She has never used smokeless tobacco. She reports current alcohol use. She reports that she does not use drugs.  Allergies  Allergen Reactions   Fexofenadine Anxiety    Makes her have a panic attack   Codeine Nausea Only    Dizziness   Methylphenidate Hcl     "Caused a seizure"   Zoloft [Sertraline] Other (See Comments)    Sleepiness   Magnevist [Gadopentetate] Hives and Itching    IV Contrast   Sulfa Antibiotics Rash    Family History  Problem Relation Age of Onset   Hypertension Mother    Cancer Mother    Stroke Mother    Heart disease Father    Breast cancer Sister 74   Breast cancer Paternal Aunt    Diabetes Paternal Grandmother     Prior to Admission medications   Medication Sig Start Date End Date Taking? Authorizing Provider  albuterol (VENTOLIN HFA) 108 (90 Base)  MCG/ACT inhaler INHALE 2 INHALATIONS INTO THE LUNGS EVERY 4 HOURS AS NEEDED 12/12/22  Yes [provider]  atorvastatin (LIPITOR) 40 MG tablet Take 1 tablet (40 mg total) by mouth at bedtime. 02/07/23  Yes Karamalegos, Netta Neat, DO  azelastine (ASTELIN) 0.1 % nasal spray Place 1 spray into both nostrils 2 (two) times daily. 05/15/23  Yes [provider]  busPIRone (BUSPAR) 5 MG tablet Take 1 tablet (5 mg total) by mouth 2 (two) times daily. 03/29/23 06/27/23 Yes Hisada, Barbee Cough, MD  Carboxymethylcellul-Glycerin (LUBRICATING EYE DROPS OP) Place 1 drop into both eyes daily as needed (dry eyes).   Yes [provider]  carvedilol (COREG) 6.25 MG tablet Take 1 tablet (6.25 mg total) by mouth 2 (two) times daily with a meal. 02/07/23  Yes Karamalegos, Netta Neat, DO  Cholecalciferol 125 MCG (5000 UT) TABS    Yes [provider]  cyclobenzaprine (FLEXERIL) 10 MG tablet take 1 tablet by mouth three times a day if needed for muscle spasm 02/07/23  Yes Karamalegos, Alexander J, DO  gabapentin (NEURONTIN) 600 MG tablet Take 1 tablet (600 mg total) by mouth at bedtime. 02/07/23  Yes Karamalegos, Alexander J, DO  ipratropium (ATROVENT) 0.06 % nasal spray Place 2 sprays into both nostrils 4 (four) times daily. For up to 5-7 days then stop. 03/19/23  Yes Karamalegos, Netta Neat, DO  levETIRAcetam (KEPPRA) 500 MG tablet Take 1 tablet (500 mg total) by mouth 2 (two) times daily. Patient taking differently: Take 500 mg by mouth at bedtime. 02/07/23  Yes Karamalegos, Netta Neat, DO  LORazepam (ATIVAN) 1 MG tablet Take 0.5-1 tablets (0.5-1 mg total) by mouth daily as needed for anxiety. 02/07/23  Yes Karamalegos, Netta Neat, DO  meloxicam (MOBIC) 7.5 MG tablet Take 7.5 mg by mouth 2 (two) times daily. 05/04/23  Yes [provider]  omeprazole (PRILOSEC) 40 MG capsule Take 1 capsule (40 mg total) by mouth daily before breakfast. Patient taking differently: Take 40 mg by mouth at  bedtime. 02/07/23  Yes Karamalegos, Netta Neat, DO  valsartan (DIOVAN) 320 MG tablet Take 1 tablet (320 mg total) by mouth daily. 12/04/22  Yes Karamalegos, Netta Neat, DO  Vilazodone HCl (VIIBRYD) 40 MG TABS Take 1 tablet (40 mg total) by mouth daily. 04/08/23 07/07/23 Yes Neysa Hotter, MD  ACCU-CHEK GUIDE test strip Use to check blood sugar 1 x daily as instructed 05/21/23   Smitty Cords, DO  Accu-Chek Softclix Lancets lancets TEST TWO TO THREE TIMES DAILY DX E11.69 05/17/23   Althea Charon, Netta Neat, DO  acyclovir (ZOVIRAX) 200 MG capsule Take 200 mg by mouth 5 (five) times daily as needed (fever blisters). 12/05/21   [provider]  bisacodyl (DULCOLAX) 5 MG EC tablet Take 1 tablet (5 mg total) by mouth daily as needed for moderate constipation. 06/06/23   Juanell Fairly, MD  Blood Glucose Monitoring Suppl (ACCU-CHEK GUIDE) w/Device KIT 1 Device by Does not apply route 4 (four) times daily. DX E11.69 05/17/23   Karamalegos, Netta Neat, DO  Charcoal Activated (ACTIVATED CHARCOAL PO) Take 2 capsules by mouth as needed (gas).    [provider]  enoxaparin (LOVENOX) 40 MG/0.4ML injection Inject 0.4 mLs (40 mg total) into the skin daily. 06/07/23 07/07/23  Juanell Fairly, MD  Lidocaine-Menthol (ICY HOT MAX LIDOCAINE EX) Apply 1 application. topically daily as needed (pain).    [provider]  oxyCODONE (OXY IR/ROXICODONE) 5 MG immediate release tablet Take 1 tablet (5 mg total) by mouth every 4 (four) hours as needed for moderate pain (pain score 4-6). 06/06/23   Juanell Fairly, MD  senna (SENOKOT) 8.6 MG TABS tablet Take 1 tablet (8.6 mg total) by mouth 2 (two) times daily. 06/06/23   Juanell Fairly, MD  Spacer/Aero-Holding Chambers (AEROCHAMBER PLUS WITH MASK) inhaler Use with inhaler as needed. 03/19/23   Smitty Cords, DO    Physical Exam: Vitals:   06/06/23 1254 06/06/23 1440 06/06/23 1642 06/06/23 1645  BP: (!) 142/69 127/62 (!) 120/53    Pulse: 73 78 73   Resp: 17 18 17    Temp: 98.6 F (37 C) 99 F (37.2 C) 100.2 F (37.9 C) 98 F (36.7 C)  TempSrc:  Temporal    SpO2: 97% 93% 95%   Weight:      Height:       Physical Exam Vitals and nursing note reviewed.  Constitutional:      General: She is not in acute distress.    Appearance: She is obese.  HENT:     Head: Normocephalic and atraumatic.  Eyes:     Conjunctiva/sclera: Conjunctivae normal.     Pupils: Pupils are equal, round,  and reactive to light.  Cardiovascular:     Rate and Rhythm: Normal rate and regular rhythm.     Heart sounds: No murmur heard. Pulmonary:     Effort: Pulmonary effort is normal. No respiratory distress.     Breath sounds: Normal breath sounds.  Musculoskeletal:     Right lower leg: No edema.     Comments: Left leg in brace  Skin:    General: Skin is warm and dry.  Neurological:     Mental Status: She is alert.     Comments:  Patient is alert and oriented to person, place, year.  She understands that she is in the hospital for knee surgery but is not aware of her altered mental status No dysarthria or facial asymmetry.  Able to name things correctly when asked.  When asked questions, patient will begin answering appropriately but will quickly transition to another topic completely that is nonsensical.  Due to this, unable to answer questions accurately. 5 out of 5 strength of bilateral upper extremities and right lower extremity.  Unable to test left lower extremity strength due to brace No tremors Sensation intact throughout    Data Reviewed:  BMP pending Vitamin B12 obtained on 6/17 within normal limits at 311 A1c obtained on 6/07 at goal at 6.2% Urinalysis obtained on 05/25/2023 with no evidence of gross lites, nitrites, ketones, glucosuria, hematuria or bacteria  Results are pending, will review when available.  Family Communication: Patient's daughter updated at bedside Primary team communication: Primary team  updated  Thank you very much for involving [place] in the care of your patient.  Author: Verdene Lennert, MD 06/06/2023 5:48 PM  For on call review [email or web address].

## 2023-06-06 NOTE — Assessment & Plan Note (Signed)
Per chart review, patient has a history of type 2 diabetes that is currently well-controlled with metformin only.  Last A1c of 6.2%.  CBG are well-controlled at this time  - Continue CBG monitoring without insulin

## 2023-06-06 NOTE — Plan of Care (Signed)
  Problem: Activity: Goal: Ability to avoid complications of mobility impairment will improve Outcome: Progressing   Problem: Pain Management: Goal: Pain level will decrease with appropriate interventions Outcome: Progressing   Problem: Skin Integrity: Goal: Will show signs of wound healing Outcome: Progressing   

## 2023-06-06 NOTE — Progress Notes (Signed)
Physical Therapy Treatment Patient Details Name: Amanda Wang MRN: [id number] DOB: 1951-04-25 Today's Date: 06/06/2023   History of Present Illness Pt is a 72 yo F diagnosed with left knee osteoarthritis and is s/p elective L TKA.  PMH includes: HTN, anxiety, depression, anemia, and DM.    PT Comments    Pt pleasant and motivated for therapy. She reported feeling nauseous most of the morning but these symptoms did not increase during the session. She was able to come to sitting EOB with no assist from therapist for LLE placement. Pt able to perform multiple STS from recliner throughout the session with no assistance, just heavy use of UE's on RW and verbal cues for proper LLE placement. She ambulated ~100 feet with min guard and RW, then was able to ambulate another ~110 feet after stairs back to the room. She demonstrated a slow but improved cadence compared to yesterday, as well as continued hesitancy to fully WB on the L leg. Pt was able to go up/down 8 stairs today with no LOB or episodes of L knee buckling; she required min verbal cues at the beginning for proper sequencing and placement of UE/LE's. Pt educated on proper car transfer strategies at end of session. Pt will benefit from continued PT services upon discharge to safely address deficits listed in patient problem list for decreased caregiver assistance and eventual return to PLOF.   Recommendations for follow up therapy are one component of a multi-disciplinary discharge planning process, led by the attending physician.  Recommendations may be updated based on patient status, additional functional criteria and insurance authorization.  Follow Up Recommendations       Assistance Recommended at Discharge Frequent or constant Supervision/Assistance  Patient can return home with the following A little help with walking and/or transfers;A little help with bathing/dressing/bathroom;Assistance with cooking/housework;Help with stairs or  ramp for entrance;Assist for transportation   Equipment Recommendations  Rolling walker (2 wheels);BSC/3in1    Recommendations for Other Services       Precautions / Restrictions Precautions Precautions: Knee Required Braces or Orthoses: Knee Immobilizer - Left Knee Immobilizer - Left: Other (comment) (KI on with rest, can be off for mobility) Restrictions Weight Bearing Restrictions: Yes LLE Weight Bearing: Weight bearing as tolerated     Mobility  Bed Mobility Overal bed mobility: Needs Assistance Bed Mobility: Supine to Sit     Supine to sit: Min guard     General bed mobility comments: Min guard for safety. Pt able to move LLE under own power. Increased time and effort.    Transfers Overall transfer level: Needs assistance Equipment used: Rolling walker (2 wheels) Transfers: Sit to/from Stand Sit to Stand: Min guard           General transfer comment: Pt able to sit and stand from various height surfaces with good control and stability and with no physical assist needed, mod verbal cuing for general sequencing    Ambulation/Gait Ambulation/Gait assistance: Min guard Gait Distance (Feet): 1 x 100 feet, 1 x 110 feet Assistive device: Rolling walker (2 wheels) Gait Pattern/deviations: Step-through pattern, Decreased stance time - left, Antalgic, Decreased weight shift to left, Decreased step length - right Gait velocity: decreased     General Gait Details: Pt able to ambulate 1 x 100 feet, then 1 x 110 feet with no adverse responses. Decreased cadence but consistent and steady with no LOB or L knee buckling.   Stairs Stairs: Yes Stairs assistance: Min guard Stair Management: Two rails, Step  to pattern, Forwards Number of Stairs: 2 x 4 General stair comments: Pt demonstrated good eccentric/concentric control, no instances of L knee buckling. Verbal cues for L quad activation and placement of UE's on rails.   Wheelchair Mobility    Modified Rankin  (Stroke Patients Only)       Balance Overall balance assessment: Needs assistance   Sitting balance-Leahy Scale: Normal     Standing balance support: Bilateral upper extremity supported, During functional activity Standing balance-Leahy Scale: Good                              Cognition Arousal/Alertness: Awake/alert Behavior During Therapy: WFL for tasks assessed/performed Overall Cognitive Status: Within Functional Limits for tasks assessed                                          Exercises Total Joint Exercises Quad Sets: Strengthening, 5 reps, Left, Supine Long Arc Quad: 5 reps, Left, AROM, Strengthening, Seated Goniometric ROM: L knee AROM: 8-54 deg. limited by pain Marching in Standing: AROM, Both, 5 reps, Standing Other Exercises Other Exercises: L knee positioning education to promote L knee ext PROM    General Comments        Pertinent Vitals/Pain Pain Assessment Pain Score: 8  Pain Location: L knee Pain Descriptors / Indicators: Sore Pain Intervention(s): Premedicated before session, Repositioned, Monitored during session    Home Living                          Prior Function            PT Goals (current goals can now be found in the care plan section) Progress towards PT goals: Progressing toward goals    Frequency    BID      PT Plan Current plan remains appropriate    Co-evaluation              AM-PAC PT "6 Clicks" Mobility   Outcome Measure  Help needed turning from your back to your side while in a flat bed without using bedrails?: A Little Help needed moving from lying on your back to sitting on the side of a flat bed without using bedrails?: A Little Help needed moving to and from a bed to a chair (including a wheelchair)?: A Little Help needed standing up from a chair using your arms (e.g., wheelchair or bedside chair)?: A Little Help needed to walk in hospital room?: A Little Help  needed climbing 3-5 steps with a railing? : A Little 6 Click Score: 18    End of Session Equipment Utilized During Treatment: Gait belt Activity Tolerance: Patient tolerated treatment well Patient left: in chair;with call bell/phone within reach;with SCD's reapplied, KI to LLE Nurse Communication: Mobility status;Weight bearing status PT Visit Diagnosis: Other abnormalities of gait and mobility (R26.89);Muscle weakness (generalized) (M62.81);Pain Pain - Right/Left: Left Pain - part of body: Knee     Time: 1308-6578 PT Time Calculation (min) (ACUTE ONLY): 55 min  Charges:                        Cena Benton, SPT 06/06/23, 1:29 PM

## 2023-06-07 DIAGNOSIS — F339 Major depressive disorder, recurrent, unspecified: Secondary | ICD-10-CM | POA: Diagnosis present

## 2023-06-07 DIAGNOSIS — D6489 Other specified anemias: Secondary | ICD-10-CM | POA: Diagnosis not present

## 2023-06-07 DIAGNOSIS — N179 Acute kidney failure, unspecified: Secondary | ICD-10-CM

## 2023-06-07 DIAGNOSIS — Z87891 Personal history of nicotine dependence: Secondary | ICD-10-CM | POA: Diagnosis not present

## 2023-06-07 DIAGNOSIS — E860 Dehydration: Secondary | ICD-10-CM | POA: Diagnosis present

## 2023-06-07 DIAGNOSIS — Z96652 Presence of left artificial knee joint: Secondary | ICD-10-CM | POA: Diagnosis present

## 2023-06-07 DIAGNOSIS — D508 Other iron deficiency anemias: Secondary | ICD-10-CM

## 2023-06-07 DIAGNOSIS — G9341 Metabolic encephalopathy: Secondary | ICD-10-CM

## 2023-06-07 DIAGNOSIS — Z833 Family history of diabetes mellitus: Secondary | ICD-10-CM | POA: Diagnosis not present

## 2023-06-07 DIAGNOSIS — K589 Irritable bowel syndrome without diarrhea: Secondary | ICD-10-CM | POA: Diagnosis present

## 2023-06-07 DIAGNOSIS — F05 Delirium due to known physiological condition: Secondary | ICD-10-CM | POA: Diagnosis not present

## 2023-06-07 DIAGNOSIS — Z6832 Body mass index (BMI) 32.0-32.9, adult: Secondary | ICD-10-CM | POA: Diagnosis not present

## 2023-06-07 DIAGNOSIS — M1712 Unilateral primary osteoarthritis, left knee: Secondary | ICD-10-CM | POA: Diagnosis present

## 2023-06-07 DIAGNOSIS — I10 Essential (primary) hypertension: Secondary | ICD-10-CM | POA: Diagnosis present

## 2023-06-07 DIAGNOSIS — R110 Nausea: Secondary | ICD-10-CM | POA: Diagnosis not present

## 2023-06-07 DIAGNOSIS — Z888 Allergy status to other drugs, medicaments and biological substances status: Secondary | ICD-10-CM | POA: Diagnosis not present

## 2023-06-07 DIAGNOSIS — Z882 Allergy status to sulfonamides status: Secondary | ICD-10-CM | POA: Diagnosis not present

## 2023-06-07 DIAGNOSIS — E1169 Type 2 diabetes mellitus with other specified complication: Secondary | ICD-10-CM | POA: Diagnosis present

## 2023-06-07 DIAGNOSIS — K219 Gastro-esophageal reflux disease without esophagitis: Secondary | ICD-10-CM | POA: Diagnosis present

## 2023-06-07 DIAGNOSIS — E785 Hyperlipidemia, unspecified: Secondary | ICD-10-CM | POA: Diagnosis present

## 2023-06-07 DIAGNOSIS — Z9884 Bariatric surgery status: Secondary | ICD-10-CM | POA: Diagnosis not present

## 2023-06-07 DIAGNOSIS — Z8249 Family history of ischemic heart disease and other diseases of the circulatory system: Secondary | ICD-10-CM | POA: Diagnosis not present

## 2023-06-07 DIAGNOSIS — F410 Panic disorder [episodic paroxysmal anxiety] without agoraphobia: Secondary | ICD-10-CM | POA: Diagnosis present

## 2023-06-07 DIAGNOSIS — G2581 Restless legs syndrome: Secondary | ICD-10-CM | POA: Diagnosis present

## 2023-06-07 DIAGNOSIS — Z79899 Other long term (current) drug therapy: Secondary | ICD-10-CM | POA: Diagnosis not present

## 2023-06-07 DIAGNOSIS — E669 Obesity, unspecified: Secondary | ICD-10-CM | POA: Diagnosis present

## 2023-06-07 LAB — CBC WITH DIFFERENTIAL/PLATELET
Abs Immature Granulocytes: 0.04 10*3/uL (ref 0.00–0.07)
Basophils Absolute: 0 10*3/uL (ref 0.0–0.1)
Basophils Relative: 0 %
Eosinophils Absolute: 0.2 10*3/uL (ref 0.0–0.5)
Eosinophils Relative: 2 %
HCT: 26.9 % — ABNORMAL LOW (ref 36.0–46.0)
Hemoglobin: 8.6 g/dL — ABNORMAL LOW (ref 12.0–15.0)
Immature Granulocytes: 0 %
Lymphocytes Relative: 14 %
Lymphs Abs: 1.4 10*3/uL (ref 0.7–4.0)
MCH: 27.6 pg (ref 26.0–34.0)
MCHC: 32 g/dL (ref 30.0–36.0)
MCV: 86.2 fL (ref 80.0–100.0)
Monocytes Absolute: 1 10*3/uL (ref 0.1–1.0)
Monocytes Relative: 10 %
Neutro Abs: 7.3 10*3/uL (ref 1.7–7.7)
Neutrophils Relative %: 74 %
Platelets: 243 10*3/uL (ref 150–400)
RBC: 3.12 MIL/uL — ABNORMAL LOW (ref 3.87–5.11)
RDW: 19 % — ABNORMAL HIGH (ref 11.5–15.5)
WBC: 10 10*3/uL (ref 4.0–10.5)
nRBC: 0 % (ref 0.0–0.2)

## 2023-06-07 LAB — BASIC METABOLIC PANEL
Anion gap: 8 (ref 5–15)
BUN: 22 mg/dL (ref 8–23)
CO2: 26 mmol/L (ref 22–32)
Calcium: 8.1 mg/dL — ABNORMAL LOW (ref 8.9–10.3)
Chloride: 106 mmol/L (ref 98–111)
Creatinine, Ser: 0.84 mg/dL (ref 0.44–1.00)
GFR, Estimated: >60 mL/min (ref >60)
Glucose, Bld: 117 mg/dL — ABNORMAL HIGH (ref 70–99)
Potassium: 3.2 mmol/L — ABNORMAL LOW (ref 3.5–5.1)
Sodium: 140 mmol/L (ref 135–145)

## 2023-06-07 MED ORDER — CARVEDILOL 12.5 MG PO TABS
ORAL_TABLET | ORAL | Status: AC
  Filled 2023-06-07: qty 1

## 2023-06-07 MED ORDER — ACETAMINOPHEN 500 MG PO TABS
ORAL_TABLET | ORAL | Status: AC
  Filled 2023-06-07: qty 2

## 2023-06-07 MED ORDER — ENOXAPARIN SODIUM 40 MG/0.4ML IJ SOSY
PREFILLED_SYRINGE | INTRAMUSCULAR | Status: AC
  Filled 2023-06-07: qty 0.4

## 2023-06-07 MED ORDER — SENNA 8.6 MG PO TABS
ORAL_TABLET | ORAL | Status: AC
  Filled 2023-06-07: qty 1

## 2023-06-07 MED ORDER — TRAMADOL HCL 50 MG PO TABS
ORAL_TABLET | ORAL | Status: AC
  Filled 2023-06-07: qty 1

## 2023-06-07 MED ORDER — PANTOPRAZOLE SODIUM 40 MG PO TBEC
DELAYED_RELEASE_TABLET | ORAL | Status: AC
  Filled 2023-06-07: qty 1

## 2023-06-07 MED ORDER — POTASSIUM CHLORIDE CRYS ER 20 MEQ PO TBCR
EXTENDED_RELEASE_TABLET | ORAL | Status: AC
  Filled 2023-06-07: qty 2

## 2023-06-07 MED ORDER — POTASSIUM CHLORIDE CRYS ER 20 MEQ PO TBCR
40.0000 meq | EXTENDED_RELEASE_TABLET | Freq: Once | ORAL | Status: AC
  Administered 2023-06-07: 40 meq via ORAL

## 2023-06-07 MED ORDER — ACETAMINOPHEN 325 MG PO TABS
ORAL_TABLET | ORAL | Status: AC
  Filled 2023-06-07: qty 2

## 2023-06-07 NOTE — Progress Notes (Signed)
Subjective:  POD #2 s/p left TKA. Patient with confusion/mental status changes yesterday afternoon, likely post-op delirium, possibly from anesthesia or narcotic pain medication.  Patient is improved today.  No obvious signs of confusion.  MRI of the brain yesterday showed no evidence of stroke.  Patient reports left knee pain as mild at rest.  Objective:   VITALS:   Vitals:   06/06/23 2305 06/07/23 0500 06/07/23 0800 06/07/23 1213  BP: (!) 173/69 (!) 154/64 (!) 145/64 (!) 148/67  Pulse: 76 74 76 73  Resp: 18 16  18   Temp: 98.9 F (37.2 C) 97.9 F (36.6 C) 98.2 F (36.8 C) (!) 97.1 F (36.2 C)  TempSrc: Temporal Temporal Temporal Temporal  SpO2: 94% 93% 97% 100%  Weight:      Height:        PHYSICAL EXAM: Left lower extremity Neurovascular intact Sensation intact distally Intact pulses distally Dorsiflexion/Plantar flexion intact Incision: dressing C/D/I Compartment soft  LABS  Results for orders placed or performed during the hospital encounter of 06/05/23 (from the past 24 hour(s))  Basic metabolic panel     Status: Abnormal   Collection Time: 06/06/23  4:51 PM  Result Value Ref Range   Sodium 136 135 - 145 mmol/L   Potassium 3.6 3.5 - 5.1 mmol/L   Chloride 102 98 - 111 mmol/L   CO2 25 22 - 32 mmol/L   Glucose, Bld 118 (H) 70 - 99 mg/dL   BUN 23 8 - 23 mg/dL   Creatinine, Ser 1.61 (H) 0.44 - 1.00 mg/dL   Calcium 7.9 (L) 8.9 - 10.3 mg/dL   GFR, Estimated 52 (L) >60 mL/min   Anion gap 9 5 - 15  Urinalysis, Routine w reflex microscopic -Urine, Clean Catch     Status: Abnormal   Collection Time: 06/06/23  9:05 PM  Result Value Ref Range   Color, Urine STRAW (A) YELLOW   APPearance CLEAR (A) CLEAR   Specific Gravity, Urine 1.003 (L) 1.005 - 1.030   pH 6.0 5.0 - 8.0   Glucose, UA NEGATIVE NEGATIVE mg/dL   Hgb urine dipstick NEGATIVE NEGATIVE   Bilirubin Urine NEGATIVE NEGATIVE   Ketones, ur NEGATIVE NEGATIVE mg/dL   Protein, ur NEGATIVE NEGATIVE mg/dL    Nitrite NEGATIVE NEGATIVE   Leukocytes,Ua NEGATIVE NEGATIVE  Basic metabolic panel     Status: Abnormal   Collection Time: 06/07/23  6:48 AM  Result Value Ref Range   Sodium 140 135 - 145 mmol/L   Potassium 3.2 (L) 3.5 - 5.1 mmol/L   Chloride 106 98 - 111 mmol/L   CO2 26 22 - 32 mmol/L   Glucose, Bld 117 (H) 70 - 99 mg/dL   BUN 22 8 - 23 mg/dL   Creatinine, Ser 0.96 0.44 - 1.00 mg/dL   Calcium 8.1 (L) 8.9 - 10.3 mg/dL   GFR, Estimated >04 >54 mL/min   Anion gap 8 5 - 15  CBC with Differential/Platelet     Status: Abnormal   Collection Time: 06/07/23  6:48 AM  Result Value Ref Range   WBC 10.0 4.0 - 10.5 K/uL   RBC 3.12 (L) 3.87 - 5.11 MIL/uL   Hemoglobin 8.6 (L) 12.0 - 15.0 g/dL   HCT 09.8 (L) 11.9 - 14.7 %   MCV 86.2 80.0 - 100.0 fL   MCH 27.6 26.0 - 34.0 pg   MCHC 32.0 30.0 - 36.0 g/dL   RDW 82.9 (H) 56.2 - 13.0 %   Platelets 243 150 - 400 K/uL  nRBC 0.0 0.0 - 0.2 %   Neutrophils Relative % 74 %   Neutro Abs 7.3 1.7 - 7.7 K/uL   Lymphocytes Relative 14 %   Lymphs Abs 1.4 0.7 - 4.0 K/uL   Monocytes Relative 10 %   Monocytes Absolute 1.0 0.1 - 1.0 K/uL   Eosinophils Relative 2 %   Eosinophils Absolute 0.2 0.0 - 0.5 K/uL   Basophils Relative 0 %   Basophils Absolute 0.0 0.0 - 0.1 K/uL   Immature Granulocytes 0 %   Abs Immature Granulocytes 0.04 0.00 - 0.07 K/uL    MR BRAIN WO CONTRAST  Result Date: 06/07/2023 CLINICAL DATA:  Provided history: Mental status change, unknown cause. EXAM: MRI HEAD WITHOUT CONTRAST TECHNIQUE: Multiplanar, multiecho pulse sequences of the brain and surrounding structures were obtained without intravenous contrast. COMPARISON:  Head CT 06/06/2023.  Brain MRI 05/28/2010. FINDINGS: Brain: No age advanced or lobar predominant parenchymal atrophy. Small pineal cyst. No cortical encephalomalacia is identified. No significant cerebral white matter disease for age. There is no acute infarct. No evidence of an intracranial mass. No chronic intracranial  blood products. No extra-axial fluid collection. No midline shift. Vascular: Maintained flow voids within the proximal large arterial vessels. Skull and upper cervical spine: No focal suspicious marrow lesion. Sinuses/Orbits: No mass or acute finding within the imaged orbits. No significant paranasal sinus disease. IMPRESSION: 1.  No evidence of an acute intracranial abnormality. 2. Small pineal cyst. Otherwise unremarkable non-contrast MRI appearance of the brain for age. Electronically Signed   By: Jackey Loge D.O.   On: 06/07/2023 07:38   CT HEAD WO CONTRAST ( )  Result Date: 06/06/2023 CLINICAL DATA:  Mental status change EXAM: CT HEAD WITHOUT CONTRAST TECHNIQUE: Contiguous axial images were obtained from the base of the skull through the vertex without intravenous contrast. RADIATION DOSE REDUCTION: This exam was performed according to the departmental dose-optimization program which includes automated exposure control, adjustment of the mA and/or kV according to patient size and/or use of iterative reconstruction technique. COMPARISON:  Head CT 06/18/2010 FINDINGS: Brain: No evidence of acute infarction, hemorrhage, hydrocephalus, extra-axial collection or mass lesion/mass effect. Vascular: No hyperdense vessel or unexpected calcification. Skull: Normal. Negative for fracture or focal lesion. Sinuses/Orbits: No acute finding. Other: None. IMPRESSION: No acute intracranial abnormality. Electronically Signed   By: Darliss Cheney M.D.   On: 06/06/2023 18:47    Assessment/Plan: 2 Days Post-Op   Principal Problem:   Acute metabolic encephalopathy Active Problems:   Depression, recurrent (HCC)   Type 2 diabetes mellitus with other specified complication (HCC)   Essential hypertension   Iron deficiency anemia   S/P TKR (total knee replacement) using cement, left   AKI (acute kidney injury) (HCC)  Patient with resolved mental status changes.  Continue physical therapy.  She will be discharged home  today.  She was encouraged to continue to elevate left lower extremity and use her Polar Care.  She may weight-bear as tolerated on the left lower extremity with a walker.  She will follow-up in our office for her first postop physical therapy visit on 06/11/2023.  Patient will take Lovenox for DVT prophylaxis until follow-up with me on 06/20/2023.    Amanda Wang , MD 06/07/2023, 1:17 PM

## 2023-06-07 NOTE — Assessment & Plan Note (Signed)
Much improved.  Likely anesthesia related.  CT and MRI of the brain negative for stroke.  Stable for disposition.

## 2023-06-07 NOTE — Hospital Course (Signed)
72 y.o. female with past medical history of type 2 diabetes, hypertension, hyperlipidemia, depression, iron deficiency anemia, who has been admitted to [facility] for total knee arthroplasty complicated by altered mental status.   History obtained from patient's daughter [name]) at bedside and from patient's RN.  Patient's daughter states that yesterday, patient seemed to to be slightly confused and distractible yesterday.  Today when her ex-husband visited, he noted a significant difference in her, and called [name] to come see her immediately.  [name] states that she occasionally seems to have difficulty finding her words but otherwise just seems to have a hard time keeping her thoughts straight.  At this time, Ms. Smail denies any chest pain, shortness of breath, nausea, vomiting, abdominal pain.  She denies any focal weakness.  6/20.  Mental status much improved today.  Likely secondary to anesthesia since she was also nauseous yesterday.  CT scan and MRI of the brain negative for stroke.  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  Follow-up with PMD as outpatient.

## 2023-06-07 NOTE — Plan of Care (Signed)
  Problem: Activity: Goal: Ability to avoid complications of mobility impairment will improve Outcome: Progressing   Problem: Pain Management: Goal: Pain level will decrease with appropriate interventions Outcome: Progressing   Problem: Skin Integrity: Goal: Will show signs of wound healing Outcome: Progressing   

## 2023-06-07 NOTE — Progress Notes (Addendum)
Physical Therapy Treatment Patient Details Name: Amanda Wang MRN: [id number] DOB: [birth year] Today's Date: 06/07/2023   History of Present Illness Pt is a 72 yo F diagnosed with left knee osteoarthritis and is s/p elective L TKA.  PMH includes: HTN, anxiety, depression, anemia, and DM.    PT Comments    Pt pleasant and motivated for therapy. She required min A for supine < sit for trunk control and elevation, increased time/effort secondary to LLE pain. Pt educated on importance of doing HEP exercises and trying to improve L knee ROM, which she demonstrated good improvements in compared to yesterday. Pt able to stand from bed with VC's for positioning and UE placement, she had a few failed attempts but was able to successfully stand with no physical assistance after more cueing. Pt able to ambulate ~110 feet with RW and min guard for safety. Pt also able to navigate 4 steps with bilateral hand rails and min guard for safety. No instances of LOB or L knee buckling throughout ambulation or steps. Pt's cognition appeared improved compared to yesterday. She had less trouble following VC's and commands and overall didn't seem as confused. Pt will benefit from continued PT services upon discharge to safely address deficits listed in patient problem list for decreased caregiver assistance and eventual return to PLOF.   Recommendations for follow up therapy are one component of a multi-disciplinary discharge planning process, led by the attending physician.  Recommendations may be updated based on patient status, additional functional criteria and insurance authorization.  Follow Up Recommendations       Assistance Recommended at Discharge Frequent or constant Supervision/Assistance  Patient can return home with the following A little help with walking and/or transfers;A little help with bathing/dressing/bathroom;Assistance with cooking/housework;Help with stairs or ramp for entrance;Assist for  transportation   Equipment Recommendations  Rolling walker (2 wheels);BSC/3in1    Recommendations for Other Services       Precautions / Restrictions Precautions Precautions: Knee Required Braces or Orthoses: Knee Immobilizer - Left Knee Immobilizer - Left:  (KI with rest, off with mobility) Restrictions Weight Bearing Restrictions: Yes LLE Weight Bearing: Weight bearing as tolerated     Mobility  Bed Mobility Overal bed mobility: Needs Assistance Bed Mobility: Supine to Sit     Supine to sit: Min assist     General bed mobility comments: Min A for trunk control, HHA    Transfers Overall transfer level: Needs assistance Equipment used: Rolling walker (2 wheels) Transfers: Sit to/from Stand Sit to Stand: Min guard           General transfer comment: Pt able to stand from bed with VC's for UE placement and sequencing for momentum. A couple failed attempts from bed but was able to stand with no physical assistance, just min guard for safety. Pt able to stand from recliner with no problems.    Ambulation/Gait Ambulation/Gait assistance: Min guard Gait Distance (Feet): 110 Feet Assistive device: Rolling walker (2 wheels) Gait Pattern/deviations: Decreased stance time - left, Antalgic, Decreased weight shift to left, Decreased step length - right, Step-to pattern Gait velocity: decreased     General Gait Details: Continued hesitancy to fully WB on LLE, decreased stance time on L and step length on R. step-to pattern secondary to pain.   Stairs Stairs: Yes Stairs assistance: Min guard Stair Management: Two rails, Step to pattern, Forwards Number of Stairs: 4 General stair comments: Pt demonstrated good eccentric/concentric control, no instances of L knee buckling. Verbal cues for  placement of UE's on rails which pt had easier time following today   Wheelchair Mobility    Modified Rankin (Stroke Patients Only)       Balance Overall balance assessment:  Needs assistance Sitting-balance support: Feet supported, No upper extremity supported Sitting balance-Leahy Scale: Good     Standing balance support: During functional activity, Reliant on assistive device for balance, Single extremity supported Standing balance-Leahy Scale: Fair Standing balance comment: Pt able to stand with BUE's on RW with no LOB                            Cognition Arousal/Alertness: Awake/alert Behavior During Therapy: WFL for tasks assessed/performed Overall Cognitive Status: Within Functional Limits for tasks assessed                                 General Comments: Pt appeared better today. Minimal confusion noted. Better able to follow commands and cues.        Exercises Total Joint Exercises Quad Sets: Strengthening, Left, Supine, 10 reps Heel Slides: AROM, Left, 10 reps, Supine Long Arc Quad: Left, AROM, Seated, 10 reps Goniometric ROM: L knee AROM: 5-76 deg. limited by pain Other Exercises Other Exercises: L knee positioning education to promote L knee ext PROM    General Comments General comments (skin integrity, edema, etc.): spo2 93% on RA, HR 80s      Pertinent Vitals/Pain Pain Assessment Pain Assessment: 0-10 Pain Score: 8  Pain Location: L knee with mobility Pain Descriptors / Indicators: Sore, Aching Pain Intervention(s): Monitored during session, Premedicated before session, Repositioned    Home Living                          Prior Function            PT Goals (current goals can now be found in the care plan section) Progress towards PT goals: Progressing toward goals    Frequency    BID      PT Plan Current plan remains appropriate    Co-evaluation              AM-PAC PT "6 Clicks" Mobility   Outcome Measure  Help needed turning from your back to your side while in a flat bed without using bedrails?: A Little Help needed moving from lying on your back to sitting on the  side of a flat bed without using bedrails?: A Little Help needed moving to and from a bed to a chair (including a wheelchair)?: A Little Help needed standing up from a chair using your arms (e.g., wheelchair or bedside chair)?: A Little Help needed to walk in hospital room?: A Little Help needed climbing 3-5 steps with a railing? : A Little 6 Click Score: 18    End of Session Equipment Utilized During Treatment: Gait belt Activity Tolerance: Patient tolerated treatment well Patient left: in chair;with call bell/phone within reach;Other (comment) (Pt handed off to OT at end of session.) Nurse Communication: Mobility status;Weight bearing status PT Visit Diagnosis: Other abnormalities of gait and mobility (R26.89);Muscle weakness (generalized) (M62.81);Pain Pain - Right/Left: Left Pain - part of body: Knee     Time: 1610-9604 PT Time Calculation (min) (ACUTE ONLY): 43 min  Charges:  Timothy Crutchfield, SPT 06/07/23, 10:51 AM This entire session was performed under direct supervision and direction of a licensed therapist/therapist assistant. I have personally read, edited and approve of the note as written.  Loran Senters, DPT

## 2023-06-07 NOTE — Assessment & Plan Note (Signed)
Disposition as per surgery.

## 2023-06-07 NOTE — Progress Notes (Signed)
Occupational Therapy Treatment Patient Details Name: Amanda Wang MRN: [id number] DOB: 25-Sep-1951 Today's Date: 06/07/2023   History of present illness Pt is a 72 yo F diagnosed with left knee osteoarthritis and is s/p elective L TKA.  PMH includes: HTN, anxiety, depression, anemia, and DM.   OT comments  Chart reviewed, pt greeted in chair finishing up mobility with PT. Tx session targeted further education and follow up re: ADL completion following TKR as pt cognition was impaired yesterday. Pt is alert and oriented x4 on this date, initial fair safety awareness, however improved as tx session progressed. Review/trial of dressing, ADL tasks with good carry over. Hand out provided. Pt reports she will have assist from ex and grand daughter at discharge as she lives alone. OT will continue to follow acutley.    Recommendations for follow up therapy are one component of a multi-disciplinary discharge planning process, led by the attending physician.  Recommendations may be updated based on patient status, additional functional criteria and insurance authorization.    Assistance Recommended at Discharge Intermittent Supervision/Assistance  Patient can return home with the following  A little help with walking and/or transfers;A little help with bathing/dressing/bathroom;Help with stairs or ramp for entrance;Assist for transportation;Assistance with cooking/housework   Equipment Recommendations  BSC/3in1    Recommendations for Other Services      Precautions / Restrictions Precautions Precautions: Knee Required Braces or Orthoses: Knee Immobilizer - Left Knee Immobilizer - Left:  (KI with rest, off with mobility) Restrictions Weight Bearing Restrictions: Yes LLE Weight Bearing: Weight bearing as tolerated       Mobility Bed Mobility               General bed mobility comments: NT in recliner pre/post session    Transfers                         Balance  Overall balance assessment: Needs assistance Sitting-balance support: Feet supported, No upper extremity supported Sitting balance-Leahy Scale: Good                                     ADL either performed or assessed with clinical judgement   ADL Overall ADL's : Needs assistance/impaired     Grooming: Wash/dry hands;Sitting;Set up           Upper Body Dressing : Supervision/safety;Sitting   Lower Body Dressing: Minimal assistance;Sit to/from stand;Sitting/lateral leans Lower Body Dressing Details (indicate cue type and reason): socks, simulate pants                    Extremity/Trunk Assessment              Vision       Perception     Praxis      Cognition Arousal/Alertness: Awake/alert Behavior During Therapy: WFL for tasks assessed/performed Overall Cognitive Status: No family/caregiver present to determine baseline cognitive functioning Area of Impairment: Safety/judgement, Awareness                       Following Commands: Follows one step commands with increased time Safety/Judgement: Decreased awareness of safety     General Comments: Mild confusion noted, slight initial decresae in safety awareness however improved with further education        Exercises      Shoulder Instructions       General  Comments spo2 93% on RA, HR 80s    Pertinent Vitals/ Pain       Pain Assessment Pain Assessment: 0-10 Pain Score: 4  Pain Location: L knee with mobility Pain Descriptors / Indicators: Sore, Aching Pain Intervention(s): Limited activity within patient's tolerance, Monitored during session, Repositioned  Home Living                                          Prior Functioning/Environment              Frequency  Min 2X/week        Progress Toward Goals  OT Goals(current goals can now be found in the care plan section)  Progress towards OT goals: Progressing toward goals     Plan  Discharge plan remains appropriate    Co-evaluation                 AM-PAC OT "6 Clicks" Daily Activity     Outcome Measure   Help from another person eating meals?: None Help from another person taking care of personal grooming?: None Help from another person toileting, which includes using toliet, bedpan, or urinal?: A Little Help from another person bathing (including washing, rinsing, drying)?: A Little Help from another person to put on and taking off regular upper body clothing?: A Little Help from another person to put on and taking off regular lower body clothing?: A Little 6 Click Score: 20    End of Session Equipment Utilized During Treatment: Left knee immobilizer  OT Visit Diagnosis: Other abnormalities of gait and mobility (R26.89)   Activity Tolerance Patient tolerated treatment well   Patient Left in chair;with call bell/phone within reach   Nurse Communication Mobility status        Time: 1610-9604 OT Time Calculation (min): 12 min  Charges: OT General Charges $OT Visit: 1 Visit OT Treatments $Self Care/Home Management : 8-22 mins  Oleta Mouse, OTD OTR/L  06/07/23, 10:34 AM

## 2023-06-07 NOTE — Assessment & Plan Note (Signed)
Postoperative anemia.  Hemoglobin 11.8 upon admission but she was likely dehydrated with this number.  Hemoglobin now 8.6.  Previous ferritin 67.

## 2023-06-07 NOTE — Progress Notes (Signed)
Progress Note   Patient: Amanda Wang OZH:[id number] DOB: 1951/03/11 DOA: 06/05/2023     0 DOS: the patient was seen and examined on 06/07/2023   Brief hospital course: 72 y.o. female with past medical history of type 2 diabetes, hypertension, hyperlipidemia, depression, iron deficiency anemia, who has been admitted to [facility] for total knee arthroplasty complicated by altered mental status.   History obtained from patient's daughter [name]) at bedside and from patient's RN.  Patient's daughter states that yesterday, patient seemed to to be slightly confused and distractible yesterday.  Today when her ex-husband visited, he noted a significant difference in her, and called [name] to come see her immediately.  [name] states that she occasionally seems to have difficulty finding her words but otherwise just seems to have a hard time keeping her thoughts straight.  At this time, [name] denies any chest pain, shortness of breath, nausea, vomiting, abdominal pain.  She denies any focal weakness.  6/20.  Mental status much improved today.  Likely secondary to anesthesia since she was also nauseous yesterday.  CT scan and MRI of the brain negative for stroke.  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  Follow-up with PMD as outpatient.    Assessment and Plan: * Acute metabolic encephalopathy Much improved.  Likely anesthesia related.  CT and MRI of the brain negative for stroke.  Stable for disposition.  AKI (acute kidney injury) (HCC) Creatinine improved from 1.14 down to 0.84  S/P TKR (total knee replacement) using cement, left Disposition as per surgery.  Iron deficiency anemia Postoperative anemia.  Hemoglobin 11.8 upon admission but she was likely dehydrated with this number.  Hemoglobin now 8.6.  Previous ferritin 67.  Essential hypertension - Continue home irbesartan and carvedilol  Type 2 diabetes mellitus with other specified complication (HCC) Per chart review, patient has a history of type 2  diabetes that is currently well-controlled with metformin only.  Last A1c of 6.2%.  CBG are well-controlled at this time   Depression, recurrent (HCC) - Continue home buspirone and the Vilazodone         Subjective: Patient feeling better today.  Yesterday had some altered mental status after surgery and some nausea.  Has some pain in her knee.  Physical Exam: Vitals:   06/06/23 1645 06/06/23 2305 06/07/23 0500 06/07/23 0800  BP:  (!) 173/69 (!) 154/64 (!) 145/64  Pulse:  76 74 76  Resp:  18 16   Temp: 98 F (36.7 C) 98.9 F (37.2 C) 97.9 F (36.6 C) 98.2 F (36.8 C)  TempSrc:  Temporal Temporal Temporal  SpO2:  94% 93% 97%  Weight:      Height:       Physical Exam HENT:     Head: Normocephalic.     Mouth/Throat:     Pharynx: No oropharyngeal exudate.  Eyes:     General: Lids are normal.     Conjunctiva/sclera: Conjunctivae normal.  Cardiovascular:     Rate and Rhythm: Normal rate and regular rhythm.     Heart sounds: Normal heart sounds, S1 normal and S2 normal.  Pulmonary:     Breath sounds: No decreased breath sounds, wheezing, rhonchi or rales.  Abdominal:     Palpations: Abdomen is soft.     Tenderness: There is no abdominal tenderness.  Musculoskeletal:     Right lower leg: Swelling present.     Left lower leg: Swelling present.  Skin:    General: Skin is warm.     Findings:  No rash.  Neurological:     Mental Status: She is alert and oriented to person, place, and time.     Data Reviewed: MRI of the brain negative for stroke or mass.  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  Follow-up as outpatient. Creatinine 0.84 with potassium of 3.2 Hemoglobin 8.6    Disposition: Status is: Inpatient Disposition as per orthopedic surgery  Planned Discharge Destination: Home with Home Health    Time spent: 28 minutes  Author: Alford Highland, MD 06/07/2023 12:10 PM  For on call review [email or web address].

## 2023-06-07 NOTE — Assessment & Plan Note (Signed)
Creatinine improved from 1.14 down to 0.84

## 2023-06-07 NOTE — Progress Notes (Signed)
DISCHARGE NOTE:  Pt given discharge instructions and verbalized understanding. TED hose on both legs, knee immobilizer on. BSC and walker sent with pt. Pt wheeled to car by staff. Family providing transportation.

## 2023-06-08 ENCOUNTER — Ambulatory Visit: Payer: Self-pay

## 2023-06-08 ENCOUNTER — Telehealth: Payer: Self-pay

## 2023-06-08 NOTE — Chronic Care Management (AMB) (Signed)
   06/08/2023  Amanda Wang 10-17-1951 [id number]   Reason for  Encounter: Patient is not currently enrolled in the CCM program. CCM enrollment status changed to "Previously enrolled"   [facility]/Chronic Care Management [phone]

## 2023-06-08 NOTE — Transitions of Care (Post Inpatient/ED Visit) (Signed)
   06/08/2023  Name: Amanda Wang MRN: [id number] DOB: 18-Oct-1951  Today's TOC FU Call Status: Today's TOC FU Call Status:: Unsuccessul Call (1st Attempt) Unsuccessful Call (1st Attempt) Date: 06/08/23  Attempted to reach the patient regarding the most recent Inpatient/ED visit.  Follow Up Plan: Additional outreach attempts will be made to reach the patient to complete the Transitions of Care (Post Inpatient/ED visit) call.   Jodelle Gross, RN, BSN, CCM Care Management Coordinator [place]

## 2023-06-09 ENCOUNTER — Emergency Department: Payer: Medicare PPO

## 2023-06-09 ENCOUNTER — Inpatient Hospital Stay
Admission: EM | Admit: 2023-06-09 | Discharge: 2023-06-11 | DRG: 896 | Disposition: A | Discharge status: Home Health | Payer: Medicare PPO | Attending: Internal Medicine | Admitting: Internal Medicine

## 2023-06-09 ENCOUNTER — Other Ambulatory Visit: Payer: Self-pay

## 2023-06-09 DIAGNOSIS — F11921 Opioid use, unspecified with intoxication delirium: Secondary | ICD-10-CM | POA: Diagnosis present

## 2023-06-09 DIAGNOSIS — E785 Hyperlipidemia, unspecified: Secondary | ICD-10-CM | POA: Diagnosis present

## 2023-06-09 DIAGNOSIS — R110 Nausea: Secondary | ICD-10-CM | POA: Diagnosis present

## 2023-06-09 DIAGNOSIS — G2581 Restless legs syndrome: Secondary | ICD-10-CM | POA: Diagnosis present

## 2023-06-09 DIAGNOSIS — Z823 Family history of stroke: Secondary | ICD-10-CM | POA: Diagnosis not present

## 2023-06-09 DIAGNOSIS — F339 Major depressive disorder, recurrent, unspecified: Secondary | ICD-10-CM | POA: Diagnosis present

## 2023-06-09 DIAGNOSIS — Z882 Allergy status to sulfonamides status: Secondary | ICD-10-CM

## 2023-06-09 DIAGNOSIS — K219 Gastro-esophageal reflux disease without esophagitis: Secondary | ICD-10-CM | POA: Diagnosis present

## 2023-06-09 DIAGNOSIS — F05 Delirium due to known physiological condition: Secondary | ICD-10-CM

## 2023-06-09 DIAGNOSIS — Z885 Allergy status to narcotic agent status: Secondary | ICD-10-CM

## 2023-06-09 DIAGNOSIS — G935 Compression of brain: Secondary | ICD-10-CM

## 2023-06-09 DIAGNOSIS — R410 Disorientation, unspecified: Secondary | ICD-10-CM

## 2023-06-09 DIAGNOSIS — T81718A Complication of other artery following a procedure, not elsewhere classified, initial encounter: Secondary | ICD-10-CM | POA: Diagnosis present

## 2023-06-09 DIAGNOSIS — Z79899 Other long term (current) drug therapy: Secondary | ICD-10-CM

## 2023-06-09 DIAGNOSIS — E1169 Type 2 diabetes mellitus with other specified complication: Secondary | ICD-10-CM | POA: Diagnosis present

## 2023-06-09 DIAGNOSIS — D508 Other iron deficiency anemias: Secondary | ICD-10-CM | POA: Diagnosis not present

## 2023-06-09 DIAGNOSIS — Z888 Allergy status to other drugs, medicaments and biological substances status: Secondary | ICD-10-CM

## 2023-06-09 DIAGNOSIS — Z833 Family history of diabetes mellitus: Secondary | ICD-10-CM

## 2023-06-09 DIAGNOSIS — I2693 Single subsegmental pulmonary embolism without acute cor pulmonale: Secondary | ICD-10-CM | POA: Diagnosis present

## 2023-06-09 DIAGNOSIS — D509 Iron deficiency anemia, unspecified: Secondary | ICD-10-CM | POA: Diagnosis present

## 2023-06-09 DIAGNOSIS — I2699 Other pulmonary embolism without acute cor pulmonale: Secondary | ICD-10-CM

## 2023-06-09 DIAGNOSIS — I10 Essential (primary) hypertension: Secondary | ICD-10-CM | POA: Diagnosis present

## 2023-06-09 DIAGNOSIS — Z8249 Family history of ischemic heart disease and other diseases of the circulatory system: Secondary | ICD-10-CM | POA: Diagnosis not present

## 2023-06-09 DIAGNOSIS — Z96652 Presence of left artificial knee joint: Secondary | ICD-10-CM

## 2023-06-09 DIAGNOSIS — G9341 Metabolic encephalopathy: Secondary | ICD-10-CM | POA: Diagnosis present

## 2023-06-09 DIAGNOSIS — M7989 Other specified soft tissue disorders: Secondary | ICD-10-CM | POA: Diagnosis not present

## 2023-06-09 DIAGNOSIS — E876 Hypokalemia: Secondary | ICD-10-CM | POA: Diagnosis present

## 2023-06-09 DIAGNOSIS — Z9884 Bariatric surgery status: Secondary | ICD-10-CM

## 2023-06-09 DIAGNOSIS — T41205A Adverse effect of unspecified general anesthetics, initial encounter: Secondary | ICD-10-CM | POA: Diagnosis present

## 2023-06-09 DIAGNOSIS — I3139 Other pericardial effusion (noninflammatory): Secondary | ICD-10-CM | POA: Diagnosis not present

## 2023-06-09 DIAGNOSIS — F419 Anxiety disorder, unspecified: Secondary | ICD-10-CM | POA: Diagnosis not present

## 2023-06-09 DIAGNOSIS — N179 Acute kidney failure, unspecified: Secondary | ICD-10-CM | POA: Diagnosis present

## 2023-06-09 DIAGNOSIS — R4182 Altered mental status, unspecified: Secondary | ICD-10-CM | POA: Diagnosis present

## 2023-06-09 DIAGNOSIS — Z87891 Personal history of nicotine dependence: Secondary | ICD-10-CM

## 2023-06-09 DIAGNOSIS — Z803 Family history of malignant neoplasm of breast: Secondary | ICD-10-CM

## 2023-06-09 DIAGNOSIS — D72829 Elevated white blood cell count, unspecified: Secondary | ICD-10-CM | POA: Diagnosis present

## 2023-06-09 DIAGNOSIS — Z471 Aftercare following joint replacement surgery: Secondary | ICD-10-CM | POA: Diagnosis not present

## 2023-06-09 DIAGNOSIS — R404 Transient alteration of awareness: Secondary | ICD-10-CM | POA: Diagnosis not present

## 2023-06-09 LAB — CBC WITH DIFFERENTIAL/PLATELET
Abs Immature Granulocytes: 0.03 10*3/uL (ref 0.00–0.07)
Basophils Absolute: 0 10*3/uL (ref 0.0–0.1)
Basophils Relative: 0 %
Eosinophils Absolute: 0.3 10*3/uL (ref 0.0–0.5)
Eosinophils Relative: 3 %
HCT: 31.2 % — ABNORMAL LOW (ref 36.0–46.0)
Hemoglobin: 9.8 g/dL — ABNORMAL LOW (ref 12.0–15.0)
Immature Granulocytes: 0 %
Lymphocytes Relative: 10 %
Lymphs Abs: 1.1 10*3/uL (ref 0.7–4.0)
MCH: 27.3 pg (ref 26.0–34.0)
MCHC: 31.4 g/dL (ref 30.0–36.0)
MCV: 86.9 fL (ref 80.0–100.0)
Monocytes Absolute: 0.6 10*3/uL (ref 0.1–1.0)
Monocytes Relative: 5 %
Neutro Abs: 8.7 10*3/uL — ABNORMAL HIGH (ref 1.7–7.7)
Neutrophils Relative %: 82 %
Platelets: 358 10*3/uL (ref 150–400)
RBC: 3.59 MIL/uL — ABNORMAL LOW (ref 3.87–5.11)
RDW: 17.3 % — ABNORMAL HIGH (ref 11.5–15.5)
WBC: 10.7 10*3/uL — ABNORMAL HIGH (ref 4.0–10.5)
nRBC: 0 % (ref 0.0–0.2)

## 2023-06-09 LAB — COMPREHENSIVE METABOLIC PANEL
ALT: 15 U/L (ref 0–44)
AST: 19 U/L (ref 15–41)
Albumin: 3.6 g/dL (ref 3.5–5.0)
Alkaline Phosphatase: 71 U/L (ref 38–126)
Anion gap: 9 (ref 5–15)
BUN: 11 mg/dL (ref 8–23)
CO2: 26 mmol/L (ref 22–32)
Calcium: 8.1 mg/dL — ABNORMAL LOW (ref 8.9–10.3)
Chloride: 98 mmol/L (ref 98–111)
Creatinine, Ser: 0.65 mg/dL (ref 0.44–1.00)
GFR, Estimated: >60 mL/min (ref >60)
Glucose, Bld: 136 mg/dL — ABNORMAL HIGH (ref 70–99)
Potassium: 3 mmol/L — ABNORMAL LOW (ref 3.5–5.1)
Sodium: 134 mmol/L — ABNORMAL LOW (ref 135–145)
Total Bilirubin: 0.9 mg/dL (ref 0.3–1.2)
Total Protein: 7.1 g/dL (ref 6.5–8.1)

## 2023-06-09 LAB — URINALYSIS, ROUTINE W REFLEX MICROSCOPIC
Bilirubin Urine: NEGATIVE
Glucose, UA: NEGATIVE mg/dL
Hgb urine dipstick: NEGATIVE
Ketones, ur: NEGATIVE mg/dL
Leukocytes,Ua: NEGATIVE
Nitrite: NEGATIVE
Protein, ur: NEGATIVE mg/dL
Specific Gravity, Urine: 1.005 (ref 1.005–1.030)
pH: 7 (ref 5.0–8.0)

## 2023-06-09 LAB — BLOOD GAS, VENOUS
Acid-Base Excess: 7.6 mmol/L — ABNORMAL HIGH (ref 0.0–2.0)
pCO2, Ven: 52 mmHg (ref 44–60)

## 2023-06-09 MED ORDER — ACETAMINOPHEN 500 MG PO TABS
1000.0000 mg | ORAL_TABLET | Freq: Once | ORAL | Status: AC
  Administered 2023-06-09: 1000 mg via ORAL
  Filled 2023-06-09: qty 2

## 2023-06-09 MED ORDER — OXYCODONE HCL 5 MG PO TABS
5.0000 mg | ORAL_TABLET | Freq: Once | ORAL | Status: AC
  Administered 2023-06-09: 5 mg via ORAL
  Filled 2023-06-09: qty 1

## 2023-06-09 NOTE — ED Triage Notes (Signed)
Pt arrives via POV with CC of anxiety related to knee replacement that she had on Tuesday. Pts husband reports pt has been confused and nonsensical since surgery. Pt is oriented x4 but reports feeling like she is "losing her mind."

## 2023-06-09 NOTE — ED Notes (Signed)
Report received, this RN now assuming care.  

## 2023-06-10 ENCOUNTER — Observation Stay: Payer: Medicare PPO

## 2023-06-10 ENCOUNTER — Emergency Department: Payer: Medicare PPO

## 2023-06-10 DIAGNOSIS — R410 Disorientation, unspecified: Secondary | ICD-10-CM

## 2023-06-10 DIAGNOSIS — R4182 Altered mental status, unspecified: Secondary | ICD-10-CM | POA: Diagnosis present

## 2023-06-10 DIAGNOSIS — I2693 Single subsegmental pulmonary embolism without acute cor pulmonale: Secondary | ICD-10-CM | POA: Diagnosis present

## 2023-06-10 DIAGNOSIS — Z833 Family history of diabetes mellitus: Secondary | ICD-10-CM | POA: Diagnosis not present

## 2023-06-10 DIAGNOSIS — F05 Delirium due to known physiological condition: Secondary | ICD-10-CM

## 2023-06-10 DIAGNOSIS — E1169 Type 2 diabetes mellitus with other specified complication: Secondary | ICD-10-CM

## 2023-06-10 DIAGNOSIS — I10 Essential (primary) hypertension: Secondary | ICD-10-CM | POA: Diagnosis present

## 2023-06-10 DIAGNOSIS — G9341 Metabolic encephalopathy: Secondary | ICD-10-CM | POA: Diagnosis present

## 2023-06-10 DIAGNOSIS — D509 Iron deficiency anemia, unspecified: Secondary | ICD-10-CM | POA: Diagnosis present

## 2023-06-10 DIAGNOSIS — Z803 Family history of malignant neoplasm of breast: Secondary | ICD-10-CM | POA: Diagnosis not present

## 2023-06-10 DIAGNOSIS — G2581 Restless legs syndrome: Secondary | ICD-10-CM | POA: Diagnosis present

## 2023-06-10 DIAGNOSIS — F339 Major depressive disorder, recurrent, unspecified: Secondary | ICD-10-CM | POA: Diagnosis present

## 2023-06-10 DIAGNOSIS — T81718A Complication of other artery following a procedure, not elsewhere classified, initial encounter: Secondary | ICD-10-CM | POA: Diagnosis present

## 2023-06-10 DIAGNOSIS — F419 Anxiety disorder, unspecified: Secondary | ICD-10-CM | POA: Diagnosis present

## 2023-06-10 DIAGNOSIS — Z96652 Presence of left artificial knee joint: Secondary | ICD-10-CM

## 2023-06-10 DIAGNOSIS — Z882 Allergy status to sulfonamides status: Secondary | ICD-10-CM | POA: Diagnosis not present

## 2023-06-10 DIAGNOSIS — Z885 Allergy status to narcotic agent status: Secondary | ICD-10-CM | POA: Diagnosis not present

## 2023-06-10 DIAGNOSIS — E785 Hyperlipidemia, unspecified: Secondary | ICD-10-CM

## 2023-06-10 DIAGNOSIS — I3139 Other pericardial effusion (noninflammatory): Secondary | ICD-10-CM | POA: Diagnosis not present

## 2023-06-10 DIAGNOSIS — I2699 Other pulmonary embolism without acute cor pulmonale: Secondary | ICD-10-CM

## 2023-06-10 DIAGNOSIS — E876 Hypokalemia: Secondary | ICD-10-CM | POA: Diagnosis present

## 2023-06-10 DIAGNOSIS — D508 Other iron deficiency anemias: Secondary | ICD-10-CM

## 2023-06-10 DIAGNOSIS — N179 Acute kidney failure, unspecified: Secondary | ICD-10-CM | POA: Diagnosis present

## 2023-06-10 DIAGNOSIS — F11921 Opioid use, unspecified with intoxication delirium: Secondary | ICD-10-CM | POA: Diagnosis present

## 2023-06-10 DIAGNOSIS — Z87891 Personal history of nicotine dependence: Secondary | ICD-10-CM | POA: Diagnosis not present

## 2023-06-10 DIAGNOSIS — Z9884 Bariatric surgery status: Secondary | ICD-10-CM | POA: Diagnosis not present

## 2023-06-10 DIAGNOSIS — Z823 Family history of stroke: Secondary | ICD-10-CM | POA: Diagnosis not present

## 2023-06-10 DIAGNOSIS — Z8249 Family history of ischemic heart disease and other diseases of the circulatory system: Secondary | ICD-10-CM | POA: Diagnosis not present

## 2023-06-10 DIAGNOSIS — Z888 Allergy status to other drugs, medicaments and biological substances status: Secondary | ICD-10-CM | POA: Diagnosis not present

## 2023-06-10 DIAGNOSIS — K219 Gastro-esophageal reflux disease without esophagitis: Secondary | ICD-10-CM | POA: Diagnosis present

## 2023-06-10 DIAGNOSIS — M7989 Other specified soft tissue disorders: Secondary | ICD-10-CM | POA: Diagnosis not present

## 2023-06-10 DIAGNOSIS — D72829 Elevated white blood cell count, unspecified: Secondary | ICD-10-CM | POA: Diagnosis present

## 2023-06-10 LAB — COMPREHENSIVE METABOLIC PANEL
ALT: 13 U/L (ref 0–44)
AST: 19 U/L (ref 15–41)
Albumin: 3.1 g/dL — ABNORMAL LOW (ref 3.5–5.0)
Alkaline Phosphatase: 63 U/L (ref 38–126)
Anion gap: 8 (ref 5–15)
BUN: 11 mg/dL (ref 8–23)
CO2: 28 mmol/L (ref 22–32)
Calcium: 8.4 mg/dL — ABNORMAL LOW (ref 8.9–10.3)
Chloride: 103 mmol/L (ref 98–111)
Creatinine, Ser: 0.61 mg/dL (ref 0.44–1.00)
GFR, Estimated: >60 mL/min (ref >60)
Glucose, Bld: 117 mg/dL — ABNORMAL HIGH (ref 70–99)
Potassium: 3.8 mmol/L (ref 3.5–5.1)
Sodium: 139 mmol/L (ref 135–145)
Total Bilirubin: 1 mg/dL (ref 0.3–1.2)
Total Protein: 6.4 g/dL — ABNORMAL LOW (ref 6.5–8.1)

## 2023-06-10 LAB — T4, FREE: Free T4: 1.14 ng/dL — ABNORMAL HIGH (ref 0.61–1.12)

## 2023-06-10 LAB — CBC
HCT: 28.4 % — ABNORMAL LOW (ref 36.0–46.0)
Hemoglobin: 8.9 g/dL — ABNORMAL LOW (ref 12.0–15.0)
MCH: 27.7 pg (ref 26.0–34.0)
MCHC: 31.3 g/dL (ref 30.0–36.0)
MCV: 88.5 fL (ref 80.0–100.0)
Platelets: 319 10*3/uL (ref 150–400)
RBC: 3.21 MIL/uL — ABNORMAL LOW (ref 3.87–5.11)
RDW: 17.3 % — ABNORMAL HIGH (ref 11.5–15.5)
WBC: 8.9 10*3/uL (ref 4.0–10.5)
nRBC: 0 % (ref 0.0–0.2)

## 2023-06-10 LAB — ETHANOL: Alcohol, Ethyl (B): <10 mg/dL (ref <10)

## 2023-06-10 LAB — VITAMIN B12: Vitamin B-12: 308 pg/mL (ref 180–914)

## 2023-06-10 LAB — GLUCOSE, CAPILLARY
Glucose-Capillary: 112 mg/dL — ABNORMAL HIGH (ref 70–99)
Glucose-Capillary: 120 mg/dL — ABNORMAL HIGH (ref 70–99)

## 2023-06-10 LAB — AMMONIA: Ammonia: <10 umol/L (ref 9–35)

## 2023-06-10 LAB — TYPE AND SCREEN
ABO/RH(D): A POS
Antibody Screen: NEGATIVE

## 2023-06-10 LAB — TSH: TSH: 3.274 u[IU]/mL (ref 0.350–4.500)

## 2023-06-10 LAB — FOLATE: Folate: 15 ng/mL (ref >5.9)

## 2023-06-10 LAB — TROPONIN I (HIGH SENSITIVITY): Troponin I (High Sensitivity): 6 ng/L (ref <18)

## 2023-06-10 LAB — D-DIMER, QUANTITATIVE: D-Dimer, Quant: 2.08 ug/mL-FEU — ABNORMAL HIGH (ref 0.00–0.50)

## 2023-06-10 LAB — CK: Total CK: 91 U/L (ref 38–234)

## 2023-06-10 LAB — CBG MONITORING, ED: Glucose-Capillary: 126 mg/dL — ABNORMAL HIGH (ref 70–99)

## 2023-06-10 LAB — MAGNESIUM: Magnesium: 2.1 mg/dL (ref 1.7–2.4)

## 2023-06-10 MED ORDER — PANTOPRAZOLE SODIUM 40 MG IV SOLR
40.0000 mg | Freq: Two times a day (BID) | INTRAVENOUS | Status: DC
  Administered 2023-06-10 (×2): 40 mg via INTRAVENOUS
  Filled 2023-06-10 (×2): qty 10

## 2023-06-10 MED ORDER — AMLODIPINE BESYLATE 5 MG PO TABS
5.0000 mg | ORAL_TABLET | Freq: Every day | ORAL | Status: DC
  Administered 2023-06-10 – 2023-06-11 (×2): 5 mg via ORAL
  Filled 2023-06-10 (×2): qty 1

## 2023-06-10 MED ORDER — LEVETIRACETAM 500 MG PO TABS
500.0000 mg | ORAL_TABLET | Freq: Every day | ORAL | Status: DC
  Administered 2023-06-10 (×2): 500 mg via ORAL
  Filled 2023-06-10 (×2): qty 1

## 2023-06-10 MED ORDER — INSULIN ASPART 100 UNIT/ML IJ SOLN
0.0000 [IU] | Freq: Three times a day (TID) | INTRAMUSCULAR | Status: DC
  Administered 2023-06-10: 2 [IU] via SUBCUTANEOUS
  Filled 2023-06-10: qty 1

## 2023-06-10 MED ORDER — IRBESARTAN 150 MG PO TABS
300.0000 mg | ORAL_TABLET | Freq: Every day | ORAL | Status: DC
  Administered 2023-06-10 – 2023-06-11 (×2): 300 mg via ORAL
  Filled 2023-06-10 (×2): qty 2

## 2023-06-10 MED ORDER — IOHEXOL 350 MG/ML SOLN
75.0000 mL | Freq: Once | INTRAVENOUS | Status: AC | PRN
  Administered 2023-06-10: 75 mL via INTRAVENOUS

## 2023-06-10 MED ORDER — QUETIAPINE FUMARATE 25 MG PO TABS: 12.5000 mg | ORAL_TABLET | Freq: Every evening | ORAL | Status: DC | PRN

## 2023-06-10 MED ORDER — THIAMINE HCL 100 MG/ML IJ SOLN
100.0000 mg | Freq: Every day | INTRAMUSCULAR | Status: DC
  Administered 2023-06-10 – 2023-06-11 (×2): 100 mg via INTRAVENOUS
  Filled 2023-06-10 (×2): qty 2

## 2023-06-10 MED ORDER — SODIUM CHLORIDE 0.9% FLUSH
3.0000 mL | Freq: Two times a day (BID) | INTRAVENOUS | Status: DC
  Administered 2023-06-10 – 2023-06-11 (×2): 3 mL via INTRAVENOUS

## 2023-06-10 MED ORDER — BUSPIRONE HCL 10 MG PO TABS
5.0000 mg | ORAL_TABLET | Freq: Two times a day (BID) | ORAL | Status: DC
  Administered 2023-06-10 – 2023-06-11 (×3): 5 mg via ORAL
  Filled 2023-06-10 (×3): qty 1

## 2023-06-10 MED ORDER — PANTOPRAZOLE SODIUM 40 MG PO TBEC
40.0000 mg | DELAYED_RELEASE_TABLET | Freq: Two times a day (BID) | ORAL | Status: DC
  Administered 2023-06-10 – 2023-06-11 (×2): 40 mg via ORAL
  Filled 2023-06-10 (×2): qty 1

## 2023-06-10 MED ORDER — SODIUM CHLORIDE 0.9 % IV SOLN: INTRAVENOUS | Status: DC

## 2023-06-10 MED ORDER — ENOXAPARIN SODIUM 80 MG/0.8ML IJ SOSY
1.0000 mg/kg | PREFILLED_SYRINGE | Freq: Two times a day (BID) | INTRAMUSCULAR | Status: DC
  Administered 2023-06-10: 80 mg via SUBCUTANEOUS
  Filled 2023-06-10 (×2): qty 0.8

## 2023-06-10 MED ORDER — ATORVASTATIN CALCIUM 20 MG PO TABS: 40.0000 mg | ORAL_TABLET | Freq: Every day | ORAL | Status: DC

## 2023-06-10 MED ORDER — ORAL CARE MOUTH RINSE: 15.0000 mL | OROMUCOSAL | Status: DC | PRN

## 2023-06-10 MED ORDER — POTASSIUM CHLORIDE CRYS ER 20 MEQ PO TBCR
40.0000 meq | EXTENDED_RELEASE_TABLET | Freq: Once | ORAL | Status: AC
  Administered 2023-06-10: 40 meq via ORAL
  Filled 2023-06-10: qty 2

## 2023-06-10 MED ORDER — HYDROCODONE-ACETAMINOPHEN 5-325 MG PO TABS: 1.0000 | ORAL_TABLET | Freq: Four times a day (QID) | ORAL | Status: DC | PRN

## 2023-06-10 MED ORDER — ACETAMINOPHEN 650 MG RE SUPP: 650.0000 mg | Freq: Four times a day (QID) | RECTAL | Status: DC | PRN

## 2023-06-10 MED ORDER — ONDANSETRON HCL 4 MG/2ML IJ SOLN: 4.0000 mg | Freq: Four times a day (QID) | INTRAMUSCULAR | Status: DC | PRN

## 2023-06-10 MED ORDER — HEPARIN SODIUM (PORCINE) 5000 UNIT/ML IJ SOLN: 5000.0000 [IU] | Freq: Three times a day (TID) | INTRAMUSCULAR | Status: DC

## 2023-06-10 MED ORDER — VILAZODONE HCL 20 MG PO TABS
40.0000 mg | ORAL_TABLET | Freq: Every day | ORAL | Status: DC
  Administered 2023-06-10 – 2023-06-11 (×2): 40 mg via ORAL
  Filled 2023-06-10 (×2): qty 2

## 2023-06-10 MED ORDER — BISACODYL 5 MG PO TBEC: 5.0000 mg | DELAYED_RELEASE_TABLET | Freq: Every day | ORAL | Status: DC | PRN

## 2023-06-10 MED ORDER — HYDROCODONE-ACETAMINOPHEN 5-325 MG PO TABS: 1.0000 | ORAL_TABLET | ORAL | Status: DC | PRN

## 2023-06-10 MED ORDER — LACTATED RINGERS IV SOLN: INTRAVENOUS | Status: DC

## 2023-06-10 MED ORDER — ACETAMINOPHEN 325 MG PO TABS
650.0000 mg | ORAL_TABLET | Freq: Four times a day (QID) | ORAL | Status: DC | PRN
  Administered 2023-06-10: 650 mg via ORAL
  Filled 2023-06-10: qty 2

## 2023-06-10 MED ORDER — GABAPENTIN 300 MG PO CAPS: 600.0000 mg | ORAL_CAPSULE | Freq: Every day | ORAL | Status: DC

## 2023-06-10 MED ORDER — CARVEDILOL 6.25 MG PO TABS
6.2500 mg | ORAL_TABLET | Freq: Two times a day (BID) | ORAL | Status: DC
  Administered 2023-06-10 – 2023-06-11 (×4): 6.25 mg via ORAL
  Filled 2023-06-10 (×4): qty 1

## 2023-06-10 MED ORDER — APIXABAN 5 MG PO TABS
10.0000 mg | ORAL_TABLET | Freq: Two times a day (BID) | ORAL | Status: DC
  Administered 2023-06-10 – 2023-06-11 (×3): 10 mg via ORAL
  Filled 2023-06-10 (×3): qty 2

## 2023-06-10 MED ORDER — HALOPERIDOL LACTATE 5 MG/ML IJ SOLN
1.0000 mg | Freq: Four times a day (QID) | INTRAMUSCULAR | Status: DC | PRN
  Administered 2023-06-10: 1 mg via INTRAVENOUS
  Filled 2023-06-10: qty 1

## 2023-06-10 MED ORDER — APIXABAN 5 MG PO TABS: 5.0000 mg | ORAL_TABLET | Freq: Two times a day (BID) | ORAL | Status: DC

## 2023-06-10 MED ORDER — LORAZEPAM 0.5 MG PO TABS
0.5000 mg | ORAL_TABLET | Freq: Two times a day (BID) | ORAL | Status: DC | PRN
  Administered 2023-06-10: 0.5 mg via ORAL
  Filled 2023-06-10 (×2): qty 1

## 2023-06-10 NOTE — Assessment & Plan Note (Signed)
Patient did not require as needed Haldol or Seroquel at night.  Delirium has improved..  Discontinue pain medications, muscle relaxer and gabapentin.  Continue Tylenol only for pain.

## 2023-06-10 NOTE — ED Notes (Signed)
Pt continues to remove all monitoring and getting out of bed without using call light. Pt moved to hallway for better visual from nursing station.

## 2023-06-10 NOTE — Consult Note (Signed)
ANTICOAGULATION CONSULT NOTE  Pharmacy Consult for therapeutic enoxaparin Indication: pulmonary embolus  Allergies  Allergen Reactions   Fexofenadine Anxiety    Makes her have a panic attack   Codeine Nausea Only    Dizziness   Methylphenidate Hcl     "Caused a seizure"   Zoloft [Sertraline] Other (See Comments)    Sleepiness   Magnevist [Gadopentetate] Hives and Itching    IV Contrast   Sulfa Antibiotics Rash    Patient Measurements: Height: 5\' 2"  (157.5 cm) Weight: 81.1 kg (178 lb 14.4 oz) IBW/kg (Calculated) : 50.1   Vital Signs: Temp: 98 F (36.7 C) (06/23 0247) Temp Source: Oral (06/23 0247) BP: 177/76 (06/23 0155) Pulse Rate: 72 (06/23 0155)  Labs: Recent Labs    06/07/23 0648 06/09/23 2100 06/10/23 0137  HGB 8.6* 9.8*  --   HCT 26.9* 31.2*  --   PLT 243 358  --   CREATININE 0.84 0.65  --   CKTOTAL  --   --  91  TROPONINIHS  --   --  6    Estimated Creatinine Clearance: 64.6 mL/min (by C-G formula based on SCr of 0.65 mg/dL).   Medical History: Past Medical History:  Diagnosis Date   Advanced care planning/counseling discussion 09/16/2018   Anemia    Arthritis    Chiari malformation type I (HCC)    Depression    Diabetes mellitus, type 2 (HCC)    GERD (gastroesophageal reflux disease)    History of hiatal hernia    Hyperlipidemia    Hypertension    Iron deficiency anemia    Irritable bowel syndrome with diarrhea    Osteoarthritis of left knee    PONV (postoperative nausea and vomiting)    Restless legs    Seasonal allergies    Sleep apnea    no Cpap use   Small intestinal bacterial overgrowth    Spinal stenosis    Vaccine reaction, initial encounter 03/11/2020    Medications:  Patient on enoxaparin 40 mg SubQ daily for vte prophylaxis post TKA.  Assessment: 72 yo female presented to the ED with altered mental status.  Patient recently underwent TKA on 06/05/23.  Imaging on this admission revealed pulmonary embolus.  Pharmacy  consulted to start therapeutic enoxaparin.  Goal of Therapy:  Anti-Xa level 0.6-1 units/ml 4hrs after LMWH dose given Monitor platelets by anticoagulation protocol: Yes   Plan:  Start enoxaparin 1 mg/kg (80 mg) SubQ every 12 hours Check Anti-Xa level at steady state if prolonged therapy expected CBC at least every 72 hours  Barrie Folk, PharmD 06/10/2023,2:59 AM

## 2023-06-10 NOTE — Assessment & Plan Note (Addendum)
-   Continue Coreg and Avapro ?

## 2023-06-10 NOTE — Consult Note (Signed)
ANTICOAGULATION CONSULT NOTE  Pharmacy Consult for Apixaban Indication: pulmonary embolus  Allergies  Allergen Reactions   Fexofenadine Anxiety    Makes her have a panic attack   Codeine Nausea Only    Dizziness   Methylphenidate Hcl     "Caused a seizure"   Zoloft [Sertraline] Other (See Comments)    Sleepiness   Magnevist [Gadopentetate] Hives and Itching    IV Contrast   Sulfa Antibiotics Rash    Patient Measurements: Height: 5\' 2"  (157.5 cm) Weight: 81.1 kg (178 lb 14.4 oz) IBW/kg (Calculated) : 50.1   Vital Signs: Temp: 98 F (36.7 C) (06/23 0247) Temp Source: Oral (06/23 0247) BP: 181/74 (06/23 0730) Pulse Rate: 86 (06/23 0730)  Labs: Recent Labs    06/09/23 2100 06/10/23 0137  HGB 9.8* 8.9*  HCT 31.2* 28.4*  PLT 358 319  CREATININE 0.65 0.61  CKTOTAL  --  91  TROPONINIHS  --  6     Estimated Creatinine Clearance: 64.6 mL/min (by C-G formula based on SCr of 0.61 mg/dL).   Medical History: Past Medical History:  Diagnosis Date   Advanced care planning/counseling discussion 09/16/2018   Anemia    Arthritis    Chiari malformation type I (HCC)    Depression    Diabetes mellitus, type 2 (HCC)    GERD (gastroesophageal reflux disease)    History of hiatal hernia    Hyperlipidemia    Hypertension    Iron deficiency anemia    Irritable bowel syndrome with diarrhea    Osteoarthritis of left knee    PONV (postoperative nausea and vomiting)    Restless legs    Seasonal allergies    Sleep apnea    no Cpap use   Small intestinal bacterial overgrowth    Spinal stenosis    Vaccine reaction, initial encounter 03/11/2020    Assessment: ESPERANZA MADRAZO is a 72 y.o. female that presented to the ED with altered mental status. Patient recently underwent TKA on 06/05/23. She was on enoxaparin 40 mg SQ daily for VTE prophylaxis after TKA. Imaging on this admission revealed pulmonary embolus.  Pharmacy consulted to dose apixaban.   Plan:  Discontinue  therapeutic Lovenox Start apixaban 10 mg BID x7d then 5 mg BID thereafter First dose of apixaban to be given at 1500 (time next dose of Lovenox would be due) Pharmacy will sign off and continue to monitor peripherally    Celene Squibb, PharmD Clinical Pharmacist 06/10/2023 12:24 PM

## 2023-06-10 NOTE — Assessment & Plan Note (Addendum)
Last hemoglobin A1c 6.2.  Hold Lipitor with altered mental status.

## 2023-06-10 NOTE — ED Notes (Signed)
Patient transported back from CT 

## 2023-06-10 NOTE — Assessment & Plan Note (Addendum)
Continue Viibryd and BuSpar.

## 2023-06-10 NOTE — H&P (Signed)
History and Physical    Patient: Amanda Wang:[id number] DOB: [birth year] DOA: 06/09/2023 DOS: the patient was seen and examined on 06/10/2023 PCP: Smitty Cords, DO  Patient coming from: Home  Chief Complaint:  Chief Complaint  Patient presents with   Post-op Problem    HPI: Amanda Wang is a 72 y.o. female with medical history significant for diabetes mellitus type 2,  hypertension, iron deficiency anemia presenting today for confusion.  Patient was brought by: Family member with complaints of anxiety.  Patient noted and on Tuesday has been confused since her surgery.  Patient is oriented but feels like she is losing her mind per nurses note.  Patient is seen hematology for her iron deficiency anemia receiving iron infusions.  She was seen on June 18 by Dr. Martha Clan for left knee osteoarthritis with planned left total knee arthroplasty.  Medicine was consulted for altered mental status on the 19th.  At that time her altered mental status was improved and attributed to anesthesia related and CT and MRI of the brain that were done were negative.  Patient's abnormal kidney function also had improved.  Hemoglobin at that visit was 8.6 postoperatively and upon admission was 11.8.  Today patient reports that she has been seeing things and hallucinating.  Initial presentation showed stable vitals afebrile 98.2 heart rate 87 blood pressure elevated at 181/75 O2 sats of 100% on room air.  Venous blood gas within normal limits, hypokalemia of 3.0 magnesium level ordered and pending LFTs within normal limits, CBC shows leukocytosis mild 10.7 hemoglobin that is chronically low today is 9.8 RDW 17.3 platelet count of 358.  Urinalysis today is within normal limits.  Patient had an MRI of the brain today report is pending.  Will verify if patient has any history of seizure disorder patient is currently on Keppra which we will continue and seizure precautions.  Review of Systems: Review of  Systems  Unable to perform ROS: Mental status change (delirium)   Past Medical History:  Diagnosis Date   Advanced care planning/counseling discussion 09/16/2018   Anemia    Arthritis    Chiari malformation type I (HCC)    Depression    Diabetes mellitus, type 2 (HCC)    GERD (gastroesophageal reflux disease)    History of hiatal hernia    Hyperlipidemia    Hypertension    Iron deficiency anemia    Irritable bowel syndrome with diarrhea    Osteoarthritis of left knee    PONV (postoperative nausea and vomiting)    Restless legs    Seasonal allergies    Sleep apnea    no Cpap use   Small intestinal bacterial overgrowth    Spinal stenosis    Vaccine reaction, initial encounter 03/11/2020   Past Surgical History:  Procedure Laterality Date   BREAST BIOPSY Right    neg   chiari malformation I decompression surgery  10/2005   COLONOSCOPY WITH PROPOFOL N/A 11/28/2018   Procedure: COLONOSCOPY WITH PROPOFOL;  Surgeon: Wyline Mood, MD;  Location: [facility] ENDOSCOPY;  Service: Gastroenterology;  Laterality: N/A;   COLONOSCOPY WITH PROPOFOL N/A 10/27/2021   Procedure: COLONOSCOPY WITH PROPOFOL;  Surgeon: Wyline Mood, MD;  Location: [facility] ENDOSCOPY;  Service: Gastroenterology;  Laterality: N/A;   ESOPHAGOGASTRODUODENOSCOPY N/A 10/27/2021   Procedure: ESOPHAGOGASTRODUODENOSCOPY (EGD);  Surgeon: Wyline Mood, MD;  Location: [facility] ENDOSCOPY;  Service: Gastroenterology;  Laterality: N/A;   HERNIA REPAIR     s/p gastric sleeve   KNEE ARTHROSCOPY WITH MEDIAL MENISECTOMY Left  01/12/2022   Procedure: KNEE ARTHROSCOPY WITH MEDIAL MENISECTOMY;  Surgeon: Juanell Fairly, MD;  Location: [facility] ORS;  Service: Orthopedics;  Laterality: Left;   LAPAROSCOPIC GASTRIC SLEEVE RESECTION     LASIK Bilateral    NASAL SEPTUM SURGERY     TOTAL KNEE ARTHROPLASTY Left 06/05/2023   Procedure: TOTAL KNEE ARTHROPLASTY;  Surgeon: Juanell Fairly, MD;  Location: [facility] ORS;  Service: Orthopedics;  Laterality: Left;    Social History:  reports that she quit smoking about 45 years ago. Her smoking use included cigarettes. She has a 0.50 pack-year smoking history. She has never been exposed to tobacco smoke. She has never used smokeless tobacco. She reports current alcohol use. She reports that she does not use drugs.  Allergies  Allergen Reactions   Fexofenadine Anxiety    Makes her have a panic attack   Codeine Nausea Only    Dizziness   Methylphenidate Hcl     "Caused a seizure"   Zoloft [Sertraline] Other (See Comments)    Sleepiness   Magnevist [Gadopentetate] Hives and Itching    IV Contrast   Sulfa Antibiotics Rash    Family History  Problem Relation Age of Onset   Hypertension Mother    Cancer Mother    Stroke Mother    Heart disease Father    Breast cancer Sister 67   Breast cancer Paternal Aunt    Diabetes Paternal Grandmother     Prior to Admission medications   Medication Sig Start Date End Date Taking? Authorizing Provider  ACCU-CHEK GUIDE test strip Use to check blood sugar 1 x daily as instructed 05/21/23   Smitty Cords, DO  Accu-Chek Softclix Lancets lancets TEST TWO TO THREE TIMES DAILY DX E11.69 05/17/23   Althea Charon, Netta Neat, DO  acyclovir (ZOVIRAX) 200 MG capsule Take 200 mg by mouth 5 (five) times daily as needed (fever blisters). 12/05/21   [provider]  albuterol (VENTOLIN HFA) 108 (90 Base) MCG/ACT inhaler INHALE 2 INHALATIONS INTO THE LUNGS EVERY 4 HOURS AS NEEDED 12/12/22   [provider]  atorvastatin (LIPITOR) 40 MG tablet Take 1 tablet (40 mg total) by mouth at bedtime. 02/07/23   Karamalegos, Netta Neat, DO  azelastine (ASTELIN) 0.1 % nasal spray Place 1 spray into both nostrils 2 (two) times daily. 05/15/23   [provider]  bisacodyl (DULCOLAX) 5 MG EC tablet Take 1 tablet (5 mg total) by mouth daily as needed for moderate constipation. 06/06/23   Juanell Fairly, MD  Blood Glucose Monitoring Suppl (ACCU-CHEK GUIDE)  w/Device KIT 1 Device by Does not apply route 4 (four) times daily. DX E11.69 05/17/23   Karamalegos, Netta Neat, DO  busPIRone (BUSPAR) 5 MG tablet Take 1 tablet (5 mg total) by mouth 2 (two) times daily. 03/29/23 06/27/23  Neysa Hotter, MD  Carboxymethylcellul-Glycerin (LUBRICATING EYE DROPS OP) Place 1 drop into both eyes daily as needed (dry eyes).    [provider]  carvedilol (COREG) 6.25 MG tablet Take 1 tablet (6.25 mg total) by mouth 2 (two) times daily with a meal. 02/07/23   Karamalegos, Netta Neat, DO  Charcoal Activated (ACTIVATED CHARCOAL PO) Take 2 capsules by mouth as needed (gas).    [provider]  Cholecalciferol 125 MCG (5000 UT) TABS     [provider]  cyclobenzaprine (FLEXERIL) 10 MG tablet take 1 tablet by mouth three times a day if needed for muscle spasm 02/07/23   Smitty Cords, DO  enoxaparin (LOVENOX) 40 MG/0.4ML injection Inject  0.4 mLs (40 mg total) into the skin daily. 06/07/23 07/07/23  Juanell Fairly, MD  gabapentin (NEURONTIN) 600 MG tablet Take 1 tablet (600 mg total) by mouth at bedtime. 02/07/23   Karamalegos, Netta Neat, DO  ipratropium (ATROVENT) 0.06 % nasal spray Place 2 sprays into both nostrils 4 (four) times daily. For up to 5-7 days then stop. 03/19/23   Karamalegos, Netta Neat, DO  levETIRAcetam (KEPPRA) 500 MG tablet Take 1 tablet (500 mg total) by mouth 2 (two) times daily. Patient taking differently: Take 500 mg by mouth at bedtime. 02/07/23   Karamalegos, Alexander J, DO  Lidocaine-Menthol (ICY HOT MAX LIDOCAINE EX) Apply 1 application. topically daily as needed (pain).    [provider]  LORazepam (ATIVAN) 1 MG tablet Take 0.5-1 tablets (0.5-1 mg total) by mouth daily as needed for anxiety. 02/07/23   Karamalegos, Netta Neat, DO  omeprazole (PRILOSEC) 40 MG capsule Take 1 capsule (40 mg total) by mouth daily before breakfast. Patient taking differently: Take 40 mg by mouth at bedtime. 02/07/23    Karamalegos, Netta Neat, DO  oxyCODONE (OXY IR/ROXICODONE) 5 MG immediate release tablet Take 1 tablet (5 mg total) by mouth every 4 (four) hours as needed for moderate pain (pain score 4-6). 06/06/23   Juanell Fairly, MD  senna (SENOKOT) 8.6 MG TABS tablet Take 1 tablet (8.6 mg total) by mouth 2 (two) times daily. 06/06/23   Juanell Fairly, MD  Spacer/Aero-Holding Chambers (AEROCHAMBER PLUS WITH MASK) inhaler Use with inhaler as needed. 03/19/23   Karamalegos, Netta Neat, DO  valsartan (DIOVAN) 320 MG tablet Take 1 tablet (320 mg total) by mouth daily. 12/04/22   Karamalegos, Netta Neat, DO  Vilazodone HCl (VIIBRYD) 40 MG TABS Take 1 tablet (40 mg total) by mouth daily. 04/08/23 07/07/23  Neysa Hotter, MD     Vitals:   06/09/23 4401 06/09/23 2059 06/09/23 2130 06/09/23 2300  BP: (!) 181/75   (!) 154/75  Pulse: 87   77  Resp: 19   20  Temp:   98.2 F (36.8 C)   TempSrc:   Oral   SpO2: 100%   97%  Weight:  81.1 kg    Height:  5\' 2"  (1.575 m)     Physical Exam Vitals and nursing note reviewed.  Constitutional:      General: She is not in acute distress. HENT:     Head: Normocephalic and atraumatic.     Right Ear: Hearing normal.     Left Ear: Hearing normal.     Nose: Nose normal. No nasal deformity.     Mouth/Throat:     Lips: Pink.     Tongue: No lesions.     Pharynx: Oropharynx is clear.  Eyes:     General: Lids are normal.     Extraocular Movements: Extraocular movements intact.  Cardiovascular:     Rate and Rhythm: Normal rate and regular rhythm.     Heart sounds: Normal heart sounds.  Pulmonary:     Effort: Pulmonary effort is normal.     Breath sounds: Normal breath sounds.  Abdominal:     General: Bowel sounds are normal. There is no distension.     Palpations: Abdomen is soft. There is no mass.     Tenderness: There is no abdominal tenderness.  Musculoskeletal:     Right lower leg: No edema.     Left lower leg: No edema.  Skin:    General: Skin is warm.   Neurological:  General: No focal deficit present.     Mental Status: She is alert. She is disoriented.     Cranial Nerves: Cranial nerves 2-12 are intact.  Psychiatric:        Attention and Perception: Attention normal.        Mood and Affect: Mood normal.        Speech: Speech normal.        Behavior: Behavior normal. Behavior is cooperative.      Labs on Admission: I have personally reviewed following labs and imaging studies  CBC: Recent Labs  Lab 06/04/23 0940 06/07/23 0648 06/09/23 2100  WBC 7.2 10.0 10.7*  NEUTROABS 5.4 7.3 8.7*  HGB 11.8* 8.6* 9.8*  HCT 37.1 26.9* 31.2*  MCV 85.3 86.2 86.9  PLT 329 243 358   Basic Metabolic Panel: Recent Labs  Lab 06/06/23 1651 06/07/23 0648 06/09/23 2100  NA 136 140 134*  K 3.6 3.2* 3.0*  CL 102 106 98  CO2 25 26 26   GLUCOSE 118* 117* 136*  BUN 23 22 11   CREATININE 1.14* 0.84 0.65  CALCIUM 7.9* 8.1* 8.1*   GFR: Estimated Creatinine Clearance: 64.6 mL/min (by C-G formula based on SCr of 0.65 mg/dL). Liver Function Tests: Recent Labs  Lab 06/09/23 2100  AST 19  ALT 15  ALKPHOS 71  BILITOT 0.9  PROT 7.1  ALBUMIN 3.6   No results for input(s): "LIPASE", "AMYLASE" in the last 168 hours. No results for input(s): "AMMONIA" in the last 168 hours. Coagulation Profile: No results for input(s): "INR", "PROTIME" in the last 168 hours. Cardiac Enzymes: No results for input(s): "CKTOTAL", "CKMB", "CKMBINDEX", "TROPONINI" in the last 168 hours. BNP (last 3 results) No results for input(s): "PROBNP" in the last 8760 hours. HbA1C: No results for input(s): "HGBA1C" in the last 72 hours. CBG: Recent Labs  Lab 06/05/23 1122  GLUCAP 113*   Lipid Profile: No results for input(s): "CHOL", "HDL", "LDLCALC", "TRIG", "CHOLHDL", "LDLDIRECT" in the last 72 hours. Thyroid Function Tests: No results for input(s): "TSH", "T4TOTAL", "FREET4", "T3FREE", "THYROIDAB" in the last 72 hours. Anemia Panel: No results for input(s):  "VITAMINB12", "FOLATE", "FERRITIN", "TIBC", "IRON", "RETICCTPCT" in the last 72 hours. Urine analysis:    Component Value Date/Time   COLORURINE YELLOW (A) 06/09/2023 2131   APPEARANCEUR CLEAR (A) 06/09/2023 2131   APPEARANCEUR Clear 04/25/2022 1623   LABSPEC 1.005 06/09/2023 2131   PHURINE 7.0 06/09/2023 2131   GLUCOSEU NEGATIVE 06/09/2023 2131   HGBUR NEGATIVE 06/09/2023 2131   BILIRUBINUR NEGATIVE 06/09/2023 2131   BILIRUBINUR Negative 04/25/2022 1623   KETONESUR NEGATIVE 06/09/2023 2131   PROTEINUR NEGATIVE 06/09/2023 2131   NITRITE NEGATIVE 06/09/2023 2131   LEUKOCYTESUR NEGATIVE 06/09/2023 2131    Radiological Exams on Admission: MR BRAIN WO CONTRAST  Result Date: 06/10/2023 CLINICAL DATA:  Delirium EXAM: MRI HEAD WITHOUT CONTRAST TECHNIQUE: Multiplanar, multiecho pulse sequences of the brain and surrounding structures were obtained without intravenous contrast. COMPARISON:  06/06/2023 FINDINGS: Brain: No acute infarct, mass effect or extra-axial collection. No acute or chronic hemorrhage. Normal white matter signal, parenchymal volume and CSF spaces. Incidental subcentimeter pineal cyst, no follow-up required. Vascular: Major flow voids are preserved. Skull and upper cervical spine: Normal calvarium and skull base. Visualized upper cervical spine and soft tissues are normal. Sinuses/Orbits:No paranasal sinus fluid levels or advanced mucosal thickening. No mastoid or middle ear effusion. Normal orbits. IMPRESSION: Normal brain MRI. Electronically Signed   By: Deatra Robinson M.D.   On: 06/10/2023 01:10   DG  Chest Portable 1 View  Result Date: 06/09/2023 CLINICAL DATA:  Status post recent knee replacement with subsequent altered mental status. EXAM: PORTABLE CHEST 1 VIEW COMPARISON:  July 24, 2012 FINDINGS: The heart size and mediastinal contours are within normal limits. There is no evidence of an acute infiltrate, pleural effusion or pneumothorax. The visualized skeletal structures  are unremarkable. IMPRESSION: No active disease. Electronically Signed   By: Aram Candela M.D.   On: 06/09/2023 23:15   CT Head Wo Contrast  Result Date: 06/09/2023 CLINICAL DATA:  Altered level of consciousness, anxiety, confusion EXAM: CT HEAD WITHOUT CONTRAST TECHNIQUE: Contiguous axial images were obtained from the base of the skull through the vertex without intravenous contrast. RADIATION DOSE REDUCTION: This exam was performed according to the departmental dose-optimization program which includes automated exposure control, adjustment of the mA and/or kV according to patient size and/or use of iterative reconstruction technique. COMPARISON:  06/06/2023 FINDINGS: Brain: No acute infarct or hemorrhage. Lateral ventricles and midline structures are unremarkable. No acute extra-axial fluid collections. No mass effect. Vascular: No hyperdense vessel or unexpected calcification. Skull: Postsurgical changes from prior suboccipital craniectomy. No acute bony abnormalities. Sinuses/Orbits: No acute finding. Other: None. IMPRESSION: 1. No acute intracranial process. Electronically Signed   By: Sharlet Salina M.D.   On: 06/09/2023 23:06     Data Reviewed: Relevant notes from primary care and specialist visits, past discharge summaries as available in EHR, including Care Everywhere. Prior diagnostic testing as pertinent to current admission diagnoses Updated medications and problem lists for reconciliation ED course, including vitals, labs, imaging, treatment and response to treatment Triage notes, nursing and pharmacy notes and ED provider's notes Notable results as noted in HPI.  Assessment and Plan: * Altered mental status Patient presenting with altered mental status, delirium, patient had a similar episode on the 19th postoperatively.  MRI of the brain is pending.  We will evaluate patient for B12 level, thyroid function test, EEG, neurology consult as deemed appropriate.    AKI (acute  kidney injury) [facility]) Lab Results  Component Value Date   CREATININE 0.65 06/09/2023   CREATININE 0.84 06/07/2023   CREATININE 1.14 (H) 06/06/2023  Currently resolved. Will monitor and follow.   S/P TKR (total knee replacement) using cement, left PT consult once patient is stable per a.m. team. Fall precautions.  Iron deficiency anemia Patient is being followed by hematology and GI. Currently patient's hemoglobin is 9.8 and is stable.  Essential hypertension Will continue patient on Diovan 320 or its equivalent, Coreg.  Elevated TSH Will obtain a free T4 and a TSH level.  Type 2 diabetes mellitus with other specified complication (HCC) Glycemic protocol. Carb consistent diet.  GERD (gastroesophageal reflux disease) Continue patient on PPI treatment.  Anxiety Will continue patient on BuSpar and Viibryd.   Depression, recurrent (HCC) Again we will continue patient on Viibryd and BuSpar.     DVT prophylaxis:  Heparin  Consults:  None  Advance Care Planning:    Code Status: Prior   Family Communication:  None  Disposition Plan:  Back to previous home environment  Severity of Illness: The appropriate patient status for this patient is OBSERVATION. Observation status is judged to be reasonable and necessary in order to provide the required intensity of service to ensure the patient's safety. The patient's presenting symptoms, physical exam findings, and initial radiographic and laboratory data in the context of their medical condition is felt to place them at decreased risk for further clinical deterioration. Furthermore, it is anticipated  that the patient will be medically stable for discharge from the hospital within 2 midnights of admission.   Author: Gertha Calkin, MD 06/10/2023 1:19 AM  For on call review [email or web address].

## 2023-06-10 NOTE — Progress Notes (Signed)
       CROSS COVER NOTE  NAME: Amanda Wang MRN: [id number] DOB : [birth year]    Concern as stated by nurse / staff   Call from [facility] radiology with results of CTA chest ordered by admitting physician: Verbal report of very tiny subsegmental PE in right upper lobe.  Per radiologist " almost debating ignoring it"     Pertinent findings on chart review: History and physical reviewed.   Patient is status post total knee arthroplasty on 6/18, now admitted with altered mental status. MRI brain was unremarkable Patient with no shortness of breath, chest pain tachycardia or hypoxia    06/10/2023    1:55 AM 06/09/2023   11:00 PM 06/09/2023    8:59 PM  Vitals with BMI  Height   5\' 2"   Weight   178 lbs 14 oz  BMI   32.71  Systolic 177 154   Diastolic 76 75   Pulse 72 77      Assessment and  Interventions   Assessment: Tiny subsegmental PE right upper lobe, provoked Patient not tachycardic nor hypoxic.  No shortness of breath or chest pain  Plan: Follow official CT report Will start therapeutic dose Lovenox in view of recent TKA on 6/18 X

## 2023-06-10 NOTE — ED Notes (Signed)
Advised nurse that patient has ready bed 

## 2023-06-10 NOTE — Progress Notes (Signed)
PT Cancellation Note  Patient Details Name: Amanda Wang MRN: [id number] DOB: 1951-05-11   Cancelled Treatment:    Reason Eval/Treat Not Completed: Other (comment);Patient at procedure or test/unavailable (Twice attempted. Pt preparing for transfer to floor. On 2nd attempt, still being oriented to floor with RN. Will attempt evaluation again at later date/time.)  4:15 PM, 06/10/23 Rosamaria Lints, PT, DPT Physical Therapist - [facility]  [phone] (ASCOM)    Texanna C 06/10/2023, 4:15 PM

## 2023-06-10 NOTE — Progress Notes (Signed)
Progress Note   Patient: Amanda Wang [id number]:[id number] DOB: Aug 28, 1951 DOA: 06/09/2023     0 DOS: the patient was seen and examined on 06/10/2023   Brief hospital course: 72 year old female past medical history of hypertension, postoperative anemia, type 2 diabetes mellitus.  Recent total knee replacement with some confusion postoperatively.  Patient improved and was sent home.  At home had further confusion and hallucinations and brought back for further evaluation.  MRI of the brain was negative.  CT showed a small pulmonary embolism and started on Lovenox injections.  6/23.  Will treat for acute delirium.  Hold pain medications and gabapentin.  Continue to monitor today.   Assessment and Plan: * Acute delirium As needed Haldol, as needed Seroquel at night.  Discontinue pain medications and gabapentin.  Acute pulmonary embolism (HCC) Small and in a subsegmental branch.  Ultrasound lower extremities negative for DVT.  Convert Lovenox injections over to Eliquis.  Type 2 diabetes mellitus with hyperlipidemia (HCC) Last hemoglobin A1c 6.2.  Hold Lipitor with altered mental status.  Essential hypertension Continue Coreg and Avapro  Iron deficiency anemia Last ferritin 67, hemoglobin 8.9.  Watch closely with IV fluid hydration  S/P TKR (total knee replacement) using cement, left Physical therapy consultation and orthopedic consultation  Anxiety Continue BuSpar and Viibryd.  Only as needed Ativan.   Depression, recurrent (HCC) Continue Viibryd and BuSpar.   GERD (gastroesophageal reflux disease) Oral PPI        Subjective: Patient feels nauseous and anxiety.  Does complain of a little left knee pain.  Brought in with altered mental status.  Daughter states that she has been hallucinating at home.  They have not been giving the gabapentin and they were going with a half of pain pill.  Patient has not been eating very well.  Daughter states all she has been doing is sleeping  at home.  Physical Exam: Vitals:   06/10/23 0155 06/10/23 0247 06/10/23 0556 06/10/23 0730  BP: (!) 177/76  (!) 165/80 (!) 181/74  Pulse: 72  73 86  Resp: 18  18   Temp:  98 F (36.7 C)    TempSrc:  Oral    SpO2: 95%  96% 99%  Weight:      Height:       Physical Exam HENT:     Head: Normocephalic.     Mouth/Throat:     Pharynx: No oropharyngeal exudate.  Eyes:     General: Lids are normal.     Conjunctiva/sclera: Conjunctivae normal.  Cardiovascular:     Rate and Rhythm: Normal rate and regular rhythm.     Heart sounds: Normal heart sounds, S1 normal and S2 normal.  Pulmonary:     Breath sounds: Examination of the right-lower field reveals decreased breath sounds. Examination of the left-lower field reveals decreased breath sounds. Decreased breath sounds present. No wheezing, rhonchi or rales.  Abdominal:     Palpations: Abdomen is soft.     Tenderness: There is no abdominal tenderness.  Musculoskeletal:     Right lower leg: No swelling.     Left lower leg: No swelling.  Skin:    General: Skin is warm.     Findings: No rash.  Neurological:     Mental Status: She is alert.     Comments: Answers questions appropriately.  Able to straight leg raise.     Data Reviewed: Very small PE seen on CT scan of the chest.  Ultrasound lower extremity negative.  MRI of  the brain negative. Hemoglobin 8.9, creatinine 0.61, sodium 139  Family Communication: Spoke with daughter at the bedside  Disposition: Status is: Observation Will treat acute delirium.  Discontinue pain medications and gabapentin.  Planned Discharge Destination: Home with Home Health    Time spent: 28 minutes  Author: Alford Highland, MD 06/10/2023 12:24 PM  For on call review [email or web address].

## 2023-06-10 NOTE — Assessment & Plan Note (Addendum)
Last ferritin 67, hemoglobin 9.7.

## 2023-06-10 NOTE — Assessment & Plan Note (Deleted)
Will obtain a free T4 and a TSH level.

## 2023-06-10 NOTE — Hospital Course (Addendum)
72 year old female past medical history of hypertension, postoperative anemia, type 2 diabetes mellitus.  Recent total knee replacement with some confusion postoperatively.  Patient improved and was sent home.  At home had further confusion and hallucinations and brought back for further evaluation.  MRI of the brain was negative.  CT showed a small pulmonary embolism and started on Lovenox injections.  6/23.  Will treat for acute delirium.  Hold pain medications and gabapentin.  Continue to monitor today.

## 2023-06-10 NOTE — ED Provider Notes (Signed)
[facility] Provider Note    Event Date/Time   First MD Initiated Contact with Patient 06/09/23 2119     (approximate)   History   Post-op Problem   HPI  Amanda Wang is a 72 y.o. female here with postop confusion.  The patient underwent a knee replacement on 618 with Dr. Martha Clan.  She had been recovering well.  However, over the last 2 days, she has been increasingly confused.  She was naked in her house and tried to take her dressing off.  This is nothing like her per husband's report.  She has been seeing things and likely hallucinating.  She tells me that she has been confused but denies any other major complaints.  She says her knee has been hurting and has been taking pain medication for it.  No history of similar reactions to anesthesia in the past.     Physical Exam   Triage Vital Signs: ED Triage Vitals  Enc Vitals Group     BP 06/09/23 2058 (!) 181/75     Pulse Rate 06/09/23 2058 87     Resp 06/09/23 2058 19     Temp 06/09/23 2130 98.2 F (36.8 C)     Temp Source 06/09/23 2130 Oral     SpO2 06/09/23 2058 100 %     Weight 06/09/23 2059 178 lb 14.4 oz (81.1 kg)     Height 06/09/23 2059 5\' 2"  (1.575 m)     Head Circumference --      Peak Flow --      Pain Score 06/09/23 2059 10     Pain Loc --      Pain Edu? --      Excl. in GC? --     Most recent vital signs: Vitals:   06/09/23 2300 06/10/23 0155  BP: (!) 154/75 (!) 177/76  Pulse: 77 72  Resp: 20 18  Temp:    SpO2: 97% 95%     General: Awake, no distress.  CV:  Good peripheral perfusion.  Resp:  Normal work of breathing.  Lungs clear to auscultation bilaterally. Abd:  No distention.  No tenderness. Other:  Left knee with immobilizer in place.  Surgical dressing remains intact.  No major surrounding erythema or warmth.  Mild swelling.  Pulses intact.   ED Results / Procedures / Treatments   Labs (all labs ordered are listed, but only abnormal results are  displayed) Labs Reviewed  CBC WITH DIFFERENTIAL/PLATELET - Abnormal; Notable for the following components:      Result Value   WBC 10.7 (*)    RBC 3.59 (*)    Hemoglobin 9.8 (*)    HCT 31.2 (*)    RDW 17.3 (*)    Neutro Abs 8.7 (*)    All other components within normal limits  COMPREHENSIVE METABOLIC PANEL - Abnormal; Notable for the following components:   Sodium 134 (*)    Potassium 3.0 (*)    Glucose, Bld 136 (*)    Calcium 8.1 (*)    All other components within normal limits  BLOOD GAS, VENOUS - Abnormal; Notable for the following components:   Bicarbonate 33.7 (*)    Acid-Base Excess 7.6 (*)    All other components within normal limits  URINALYSIS, ROUTINE W REFLEX MICROSCOPIC - Abnormal; Notable for the following components:   Color, Urine YELLOW (*)    APPearance CLEAR (*)    All other components within normal limits  D-DIMER, QUANTITATIVE  MAGNESIUM  CK  COMPREHENSIVE METABOLIC PANEL  CBC  VITAMIN B12  FOLATE  T4, FREE  TSH  AMMONIA  ETHANOL  HIV ANTIBODY (ROUTINE TESTING W REFLEX)  LEVETIRACETAM LEVEL  TYPE AND SCREEN  TROPONIN I (HIGH SENSITIVITY)     EKG    RADIOLOGY CT Head: NAICA CXR Clear   I also independently reviewed and agree with radiologist interpretations.   PROCEDURES:  Critical Care performed: No   MEDICATIONS ORDERED IN ED: Medications  atorvastatin (LIPITOR) tablet 40 mg (has no administration in time range)  carvedilol (COREG) tablet 6.25 mg (has no administration in time range)  gabapentin (NEURONTIN) capsule 600 mg (has no administration in time range)  levETIRAcetam (KEPPRA) tablet 500 mg (has no administration in time range)  irbesartan (AVAPRO) tablet 300 mg (has no administration in time range)  Vilazodone HCl TABS 40 mg (has no administration in time range)  insulin aspart (novoLOG) injection 0-15 Units (has no administration in time range)  heparin injection 5,000 Units (has no administration in time range)   sodium chloride flush (NS) 0.9 % injection 3 mL (has no administration in time range)  acetaminophen (TYLENOL) tablet 650 mg (has no administration in time range)    Or  acetaminophen (TYLENOL) suppository 650 mg (has no administration in time range)  HYDROcodone-acetaminophen (NORCO/VICODIN) 5-325 MG per tablet 1 tablet (has no administration in time range)  lactated ringers infusion (has no administration in time range)  bisacodyl (DULCOLAX) EC tablet 5 mg (has no administration in time range)  thiamine (VITAMIN B1) injection 100 mg (has no administration in time range)  pantoprazole (PROTONIX) injection 40 mg (has no administration in time range)  iohexol (OMNIPAQUE) 350 MG/ML injection 75 mL (has no administration in time range)  oxyCODONE (Oxy IR/ROXICODONE) immediate release tablet 5 mg (5 mg Oral Given 06/09/23 2256)  acetaminophen (TYLENOL) tablet 1,000 mg (1,000 mg Oral Given 06/09/23 2256)  potassium chloride SA (KLOR-CON M) CR tablet 40 mEq (40 mEq Oral Given 06/10/23 0122)     IMPRESSION / MDM / ASSESSMENT AND PLAN / ED COURSE  I reviewed the triage vital signs and the nursing notes.                              Differential diagnosis includes, but is not limited to, delirium 2/2 medications, occult UTI, atelectasis/PNA, CVA  Patient's presentation is most consistent with acute presentation with potential threat to life or bodily function.  The patient is on the cardiac monitor to evaluate for evidence of arrhythmia and/or significant heart rate changes  72 yo F here with acute delrlium/altered mental status in setting of recent surgery. Suspect delirium 2/2 pain medications and general anesthesia. UA unremarkable. CXR is clear. No focal deficits. Labs overall reassuring. She remains significantly confused from her baseline, however, and is significant risk of falls. Will admit for monitoring, MRI to eval for occult CVA.     FINAL CLINICAL IMPRESSION(S) / ED DIAGNOSES    Final diagnoses:  Delirium     Shaune Pollack, MD 06/10/23 [phone]

## 2023-06-10 NOTE — Assessment & Plan Note (Signed)
Small and in a subsegmental branch.  Ultrasound lower extremities negative for DVT.  Converted Lovenox injections over to Eliquis.  Risk of bleeding explained.

## 2023-06-10 NOTE — Assessment & Plan Note (Addendum)
Oral PPI  

## 2023-06-10 NOTE — Assessment & Plan Note (Addendum)
Continue BuSpar and Viibryd.  Only as needed Ativan.

## 2023-06-10 NOTE — Assessment & Plan Note (Addendum)
Physical therapy consultation and orthopedic consultation

## 2023-06-10 NOTE — Assessment & Plan Note (Deleted)
Lab Results  Component Value Date   CREATININE 0.65 06/09/2023   CREATININE 0.84 06/07/2023   CREATININE 1.14 (H) 06/06/2023  Currently resolved. Will monitor and follow.

## 2023-06-10 NOTE — ED Notes (Signed)
MD at bedside updating daughter

## 2023-06-11 ENCOUNTER — Inpatient Hospital Stay: Payer: Medicare PPO

## 2023-06-11 DIAGNOSIS — I10 Essential (primary) hypertension: Secondary | ICD-10-CM | POA: Diagnosis not present

## 2023-06-11 DIAGNOSIS — E1169 Type 2 diabetes mellitus with other specified complication: Secondary | ICD-10-CM | POA: Diagnosis not present

## 2023-06-11 DIAGNOSIS — I2699 Other pulmonary embolism without acute cor pulmonale: Secondary | ICD-10-CM | POA: Diagnosis not present

## 2023-06-11 DIAGNOSIS — R410 Disorientation, unspecified: Secondary | ICD-10-CM | POA: Diagnosis not present

## 2023-06-11 LAB — C DIFFICILE QUICK SCREEN W PCR REFLEX
C Diff antigen: NEGATIVE
C Diff interpretation: NOT DETECTED
C Diff toxin: NEGATIVE

## 2023-06-11 LAB — CBC
HCT: 30.2 % — ABNORMAL LOW (ref 36.0–46.0)
Hemoglobin: 9.7 g/dL — ABNORMAL LOW (ref 12.0–15.0)
MCH: 27.4 pg (ref 26.0–34.0)
MCHC: 32.1 g/dL (ref 30.0–36.0)
MCV: 85.3 fL (ref 80.0–100.0)
Platelets: 358 10*3/uL (ref 150–400)
RBC: 3.54 MIL/uL — ABNORMAL LOW (ref 3.87–5.11)
RDW: 16.7 % — ABNORMAL HIGH (ref 11.5–15.5)
WBC: 7.8 10*3/uL (ref 4.0–10.5)
nRBC: 0 % (ref 0.0–0.2)

## 2023-06-11 LAB — BASIC METABOLIC PANEL
Anion gap: 10 (ref 5–15)
BUN: 9 mg/dL (ref 8–23)
CO2: 26 mmol/L (ref 22–32)
Calcium: 8.4 mg/dL — ABNORMAL LOW (ref 8.9–10.3)
Chloride: 104 mmol/L (ref 98–111)
Creatinine, Ser: 0.48 mg/dL (ref 0.44–1.00)
GFR, Estimated: >60 mL/min (ref >60)
Glucose, Bld: 117 mg/dL — ABNORMAL HIGH (ref 70–99)
Potassium: 2.9 mmol/L — ABNORMAL LOW (ref 3.5–5.1)
Sodium: 140 mmol/L (ref 135–145)

## 2023-06-11 LAB — GLUCOSE, CAPILLARY
Glucose-Capillary: 123 mg/dL — ABNORMAL HIGH (ref 70–99)
Glucose-Capillary: 123 mg/dL — ABNORMAL HIGH (ref 70–99)
Glucose-Capillary: 144 mg/dL — ABNORMAL HIGH (ref 70–99)

## 2023-06-11 LAB — LEVETIRACETAM LEVEL: Levetiracetam Lvl: 14.5 ug/mL (ref 10.0–40.0)

## 2023-06-11 LAB — HIV ANTIBODY (ROUTINE TESTING W REFLEX): HIV Screen 4th Generation wRfx: NONREACTIVE

## 2023-06-11 LAB — MAGNESIUM: Magnesium: 2.1 mg/dL (ref 1.7–2.4)

## 2023-06-11 MED ORDER — LEVETIRACETAM 500 MG PO TABS
500.0000 mg | ORAL_TABLET | Freq: Every day | ORAL | Status: DC
Start: 2023-06-11 — End: 2024-02-04

## 2023-06-11 MED ORDER — POTASSIUM CHLORIDE CRYS ER 20 MEQ PO TBCR: 20.0000 meq | EXTENDED_RELEASE_TABLET | Freq: Every day | ORAL | 0 refills | Status: DC

## 2023-06-11 MED ORDER — POTASSIUM CHLORIDE 10 MEQ/100ML IV SOLN
10.0000 meq | INTRAVENOUS | Status: AC
  Administered 2023-06-11 (×2): 10 meq via INTRAVENOUS
  Filled 2023-06-11: qty 100

## 2023-06-11 MED ORDER — ACETAMINOPHEN 325 MG PO TABS: 650.0000 mg | ORAL_TABLET | Freq: Four times a day (QID) | ORAL | Status: DC | PRN

## 2023-06-11 MED ORDER — THIAMINE MONONITRATE 100 MG PO TABS: 100.0000 mg | ORAL_TABLET | Freq: Every day | ORAL | Status: DC

## 2023-06-11 MED ORDER — APIXABAN (ELIQUIS) VTE STARTER PACK (10MG AND 5MG): ORAL_TABLET | ORAL | 0 refills | Status: DC

## 2023-06-11 MED ORDER — POTASSIUM CHLORIDE CRYS ER 20 MEQ PO TBCR
40.0000 meq | EXTENDED_RELEASE_TABLET | Freq: Two times a day (BID) | ORAL | Status: DC
  Administered 2023-06-11: 40 meq via ORAL
  Filled 2023-06-11: qty 2

## 2023-06-11 MED ORDER — AMLODIPINE BESYLATE 5 MG PO TABS: 5.0000 mg | ORAL_TABLET | Freq: Every day | ORAL | 0 refills | Status: DC

## 2023-06-11 NOTE — Consult Note (Signed)
Subjective:  POD #6 s/p left total knee arthroplasty.  Patient was readmitted over the weekend for mental status changes.  Pete MRI of the hand is normal.  CT arthrogram showed a small pulmonary embolism in a subsegmental branch of the right upper lobe.  There is no evidence of right heart strain.  Patient has been changed to Eliquis from Lovenox.  Ultrasound of the lower extremity showed no DVT.  The patient reports left knee pain as mild.    Objective:   VITALS:   Vitals:   06/10/23 2016 06/11/23 0421 06/11/23 0500 06/11/23 0728  BP: (!) 183/81 (!) 168/67  (!) 187/84  Pulse: 98 94  100  Resp: 17 16  18   Temp: 97.9 F (36.6 C) 98.4 F (36.9 C)  98.1 F (36.7 C)  TempSrc: Oral   Oral  SpO2: 99% 98%  98%  Weight:   80.8 kg   Height:        PHYSICAL EXAM: Left lower extremity: Patient's knee immobilizer is in place.  The knee immobilizer was opened and patient's dressing is clean dry and intact.  Patient has no calf tenderness.  Her compartments are soft and compressible.  She has TED hose on.  She can flex and extend her toes and dorsiflex and plantarflex her ankle and has intact sensation light touch distally.   LABS  Results for orders placed or performed during the hospital encounter of 06/09/23 (from the past 24 hour(s))  Glucose, capillary     Status: Abnormal   Collection Time: 06/10/23  5:24 PM  Result Value Ref Range   Glucose-Capillary 112 (H) 70 - 99 mg/dL  Glucose, capillary     Status: Abnormal   Collection Time: 06/10/23 10:52 PM  Result Value Ref Range   Glucose-Capillary 120 (H) 70 - 99 mg/dL  Magnesium     Status: None   Collection Time: 06/11/23  5:31 AM  Result Value Ref Range   Magnesium 2.1 1.7 - 2.4 mg/dL  HIV Antibody (routine testing w rflx)     Status: None   Collection Time: 06/11/23  5:35 AM  Result Value Ref Range   HIV Screen 4th Generation wRfx Non Reactive Non Reactive  CBC     Status: Abnormal   Collection Time: 06/11/23  5:35 AM  Result  Value Ref Range   WBC 7.8 4.0 - 10.5 K/uL   RBC 3.54 (L) 3.87 - 5.11 MIL/uL   Hemoglobin 9.7 (L) 12.0 - 15.0 g/dL   HCT 16.1 (L) 09.6 - 04.5 %   MCV 85.3 80.0 - 100.0 fL   MCH 27.4 26.0 - 34.0 pg   MCHC 32.1 30.0 - 36.0 g/dL   RDW 40.9 (H) 81.1 - 91.4 %   Platelets 358 150 - 400 K/uL   nRBC 0.0 0.0 - 0.2 %  Basic metabolic panel     Status: Abnormal   Collection Time: 06/11/23  5:35 AM  Result Value Ref Range   Sodium 140 135 - 145 mmol/L   Potassium 2.9 (L) 3.5 - 5.1 mmol/L   Chloride 104 98 - 111 mmol/L   CO2 26 22 - 32 mmol/L   Glucose, Bld 117 (H) 70 - 99 mg/dL   BUN 9 8 - 23 mg/dL   Creatinine, Ser 7.82 0.44 - 1.00 mg/dL   Calcium 8.4 (L) 8.9 - 10.3 mg/dL   GFR, Estimated >95 >62 mL/min   Anion gap 10 5 - 15  C Difficile Quick Screen w PCR reflex  Status: None   Collection Time: 06/11/23  8:56 AM   Specimen: STOOL  Result Value Ref Range   C Diff antigen NEGATIVE NEGATIVE   C Diff toxin NEGATIVE NEGATIVE   C Diff interpretation No C. difficile detected.   Glucose, capillary     Status: Abnormal   Collection Time: 06/11/23  9:10 AM  Result Value Ref Range   Glucose-Capillary 123 (H) 70 - 99 mg/dL  Glucose, capillary     Status: Abnormal   Collection Time: 06/11/23 11:46 AM  Result Value Ref Range   Glucose-Capillary 123 (H) 70 - 99 mg/dL    US Venous Img Lower Bilateral (DVT)  Result Date: 06/10/2023 CLINICAL DATA:  Lower extremity swelling.  Pulmonary embolus. EXAM: BILATERAL LOWER EXTREMITY VENOUS DOPPLER ULTRASOUND BILATERAL LOWER EXTREMITY VENOUS DOPPLER ULTRASOUND TECHNIQUE: Gray-scale sonography with graded compression, as well as color Doppler and duplex ultrasound were performed to evaluate the lower extremity deep venous systems from the level of the common femoral vein and including the common femoral, femoral, profunda femoral, popliteal and calf veins including the posterior tibial, peroneal and gastrocnemius veins when visible. The superficial great  saphenous vein was also interrogated. Spectral Doppler was utilized to evaluate flow at rest and with distal augmentation maneuvers in the common femoral, femoral and popliteal veins. COMPARISON:  None Available. FINDINGS: RIGHT LOWER EXTREMITY Common Femoral Vein: No evidence of thrombus. Normal compressibility, respiratory phasicity and response to augmentation. Saphenofemoral Junction: No evidence of thrombus. Normal compressibility and flow on color Doppler imaging. Profunda Femoral Vein: No evidence of thrombus. Normal compressibility and flow on color Doppler imaging. Femoral Vein: No evidence of thrombus. Normal compressibility, respiratory phasicity and response to augmentation. Popliteal Vein: No evidence of thrombus. Normal compressibility, respiratory phasicity and response to augmentation. Calf Veins: No evidence of thrombus. Normal compressibility and flow on color Doppler imaging. Other Findings:  None. LEFT LOWER EXTREMITY Common Femoral Vein: No evidence of thrombus. Normal compressibility, respiratory phasicity and response to augmentation. Saphenofemoral Junction: No evidence of thrombus. Normal compressibility and flow on color Doppler imaging. Profunda Femoral Vein: No evidence of thrombus. Normal compressibility and flow on color Doppler imaging. Femoral Vein: No evidence of thrombus. Normal compressibility, respiratory phasicity and response to augmentation. Popliteal Vein: Not visualized due to knee replacement and patient tolerance. Calf Veins: Not visualized due to knee replacement and patient tolerance. Other Findings:  None. IMPRESSION: 1. No evidence for acute DVT in the right lower extremity. 2. No evidence for acute DVT in the left common femoral vein, femoral vein, or profundus femoral vein. Left popliteal vein and left calf veins could not be visualized due to the patient's recent knee replacement and discomfort. Electronically Signed   By: Kennith Center M.D.   On: 06/10/2023 11:23    CT Angio Chest Pulmonary Embolism (PE) W or WO Contrast  Result Date: 06/10/2023 CLINICAL DATA:  High probability pulmonary embolism EXAM: CT ANGIOGRAPHY CHEST WITH CONTRAST TECHNIQUE: Multidetector CT imaging of the chest was performed using the standard protocol during bolus administration of intravenous contrast. Multiplanar CT image reconstructions and MIPs were obtained to evaluate the vascular anatomy. RADIATION DOSE REDUCTION: This exam was performed according to the departmental dose-optimization program which includes automated exposure control, adjustment of the mA and/or kV according to patient size and/or use of iterative reconstruction technique. CONTRAST:  75mL OMNIPAQUE IOHEXOL 350 MG/ML SOLN COMPARISON:  Radiographs 06/09/2023 FINDINGS: Cardiovascular: Acute pulmonary embolism in a subsegmental branch of the right upper lobe (4/32 and 7/85). No evidence  of right heart strain. Cardiomegaly. Small pericardial effusion. Mediastinum/Nodes: Small hiatal hernia. Patent trachea. No thoracic adenopathy. Lungs/Pleura: Mosaic attenuation of the lungs compatible with air trapping. No focal consolidation, pleural effusion, or pneumothorax. Upper Abdomen: No acute abnormality. Musculoskeletal: No acute fracture or destructive osseous lesion. Review of the MIP images confirms the above findings. IMPRESSION: 1. Acute pulmonary embolism in a subsegmental branch of the right upper lobe. No evidence of right heart strain. 2. Cardiomegaly with small pericardial effusion. 3. Mosaic attenuation of the lungs compatible with air trapping. 4. Small hiatal hernia. Critical Value/emergent results were called by telephone at the time of interpretation on 06/10/2023 at 2:47 am to provider Dr. Para March, Who verbally acknowledged these results. Electronically Signed   By: Minerva Fester M.D.   On: 06/10/2023 02:47   MR BRAIN WO CONTRAST  Result Date: 06/10/2023 CLINICAL DATA:  Delirium EXAM: MRI HEAD WITHOUT CONTRAST  TECHNIQUE: Multiplanar, multiecho pulse sequences of the brain and surrounding structures were obtained without intravenous contrast. COMPARISON:  06/06/2023 FINDINGS: Brain: No acute infarct, mass effect or extra-axial collection. No acute or chronic hemorrhage. Normal white matter signal, parenchymal volume and CSF spaces. Incidental subcentimeter pineal cyst, no follow-up required. Vascular: Major flow voids are preserved. Skull and upper cervical spine: Normal calvarium and skull base. Visualized upper cervical spine and soft tissues are normal. Sinuses/Orbits:No paranasal sinus fluid levels or advanced mucosal thickening. No mastoid or middle ear effusion. Normal orbits. IMPRESSION: Normal brain MRI. Electronically Signed   By: Deatra Robinson M.D.   On: 06/10/2023 01:10   DG Chest Portable 1 View  Result Date: 06/09/2023 CLINICAL DATA:  Status post recent knee replacement with subsequent altered mental status. EXAM: PORTABLE CHEST 1 VIEW COMPARISON:  July 24, 2012 FINDINGS: The heart size and mediastinal contours are within normal limits. There is no evidence of an acute infiltrate, pleural effusion or pneumothorax. The visualized skeletal structures are unremarkable. IMPRESSION: No active disease. Electronically Signed   By: Aram Candela M.D.   On: 06/09/2023 23:15   CT Head Wo Contrast  Result Date: 06/09/2023 CLINICAL DATA:  Altered level of consciousness, anxiety, confusion EXAM: CT HEAD WITHOUT CONTRAST TECHNIQUE: Contiguous axial images were obtained from the base of the skull through the vertex without intravenous contrast. RADIATION DOSE REDUCTION: This exam was performed according to the departmental dose-optimization program which includes automated exposure control, adjustment of the mA and/or kV according to patient size and/or use of iterative reconstruction technique. COMPARISON:  06/06/2023 FINDINGS: Brain: No acute infarct or hemorrhage. Lateral ventricles and midline structures are  unremarkable. No acute extra-axial fluid collections. No mass effect. Vascular: No hyperdense vessel or unexpected calcification. Skull: Postsurgical changes from prior suboccipital craniectomy. No acute bony abnormalities. Sinuses/Orbits: No acute finding. Other: None. IMPRESSION: 1. No acute intracranial process. Electronically Signed   By: Sharlet Salina M.D.   On: 06/09/2023 23:06    Assessment/Plan:     Principal Problem:   Acute delirium Active Problems:   Depression, recurrent (HCC)   Anxiety   GERD (gastroesophageal reflux disease)   Type 2 diabetes mellitus with hyperlipidemia (HCC)   Essential hypertension   Iron deficiency anemia   S/P TKR (total knee replacement) using cement, left   Acute pulmonary embolism (HCC)   Acute metabolic encephalopathy  Patient is experienced postop delirium likely secondary to the oxycodone which is now stopped.  She is only on Tylenol for pain and seems to be doing well with this.  Patient will continue with physical therapy  for gait training and knee range of motion exercises.  Patient is recommended for home health PT.  Patient gives a history of a fall but it is uncertain of exactly how or where this happened.  She states that happened in the hospital in a dark room, possibly the ER?  I am going to order knee x-rays to determine that has been no injury to the knee.  Patient may be discharged from an orthopedic standpoint once cleared medically.  She will follow-up in our office in 1 week and should already have a scheduled appointment.  Continue Eliquis as recommended by the medical team.    Juanell Fairly , MD 06/11/2023, 1:47 PM

## 2023-06-11 NOTE — Discharge Instructions (Signed)
Stop Lovenox injection, pain medication, muscle relaxers and gabapentin.  Tylenol only for pain.  Watch for signs of bleeding with eliquis.

## 2023-06-11 NOTE — Discharge Summary (Signed)
Physician Discharge Summary   Patient: Amanda Wang MRN: [id number] DOB: [birth year]  Admit date:     06/09/2023  Discharge date: 06/11/23  Discharge Physician: Alford Highland   PCP: Smitty Cords, DO   Recommendations at discharge:   Follow-up PCP 5 days Follow-up Dr. Martha Clan orthopedic surgery.  Keep appointment. Recommend checking a BMP and CBC and follow-up appointment  Discharge Diagnoses: Principal Problem:   Acute delirium Active Problems:   Acute pulmonary embolism (HCC)   Type 2 diabetes mellitus with hyperlipidemia (HCC)   Essential hypertension   Iron deficiency anemia   S/P TKR (total knee replacement) using cement, left   Depression, recurrent (HCC)   Anxiety   GERD (gastroesophageal reflux disease)   Acute metabolic encephalopathy    Hospital Course: 72 year old female past medical history of hypertension, postoperative anemia, type 2 diabetes mellitus.  Recent total knee replacement with some confusion postoperatively.  Patient improved and was sent home.  At home had further confusion and hallucinations and brought back for further evaluation.  MRI of the brain was negative.  CT showed a small pulmonary embolism and started on Lovenox injections.  6/23.  Will treat for acute delirium.  Hold pain medications and gabapentin.  Continue to monitor today.  Lovenox injection switched to Eliquis. 6/24.  Patient doing better with regards to her acute delirium.  Did not require Haldol or Seroquel.  I think holding pain medications was the best thing we could have done.  Discharged on Eliquis starter pack.  Assessment and Plan: * Acute delirium Patient did not require as needed Haldol or Seroquel at night.  Delirium has improved..  Discontinue pain medications, muscle relaxer and gabapentin.  Continue Tylenol only for pain.  Acute pulmonary embolism (HCC) Small and in a subsegmental branch.  Ultrasound lower extremities negative for DVT.  Converted  Lovenox injections over to Eliquis.  Risk of bleeding explained.  Type 2 diabetes mellitus with hyperlipidemia (HCC) Last hemoglobin A1c 6.2.  Can go back on Lipitor as outpatient.  Essential hypertension Continue Coreg and Avapro (Diovan at home).  Norvasc needed to be added for elevated blood pressure.  Patient has a blood pressure cuff at home and will follow-up with PMD and adjust Norvasc to 10 mg if blood pressure still remains elevated.  Iron deficiency anemia Last ferritin 67, hemoglobin 9.7.   S/P TKR (total knee replacement) using cement, left Follow-up with orthopedic surgery as outpatient.  Resume home health orders that were previously ordered.  Anxiety Continue BuSpar and Viibryd.  Only as needed Ativan.   Depression, recurrent (HCC) Continue Viibryd and BuSpar.   GERD (gastroesophageal reflux disease) Oral PPI         Consultants: Orthopedic surgery Procedures performed: None Disposition: Home health Diet recommendation:  Cardiac diet DISCHARGE MEDICATION: Allergies as of 06/11/2023       Reactions   Fexofenadine Anxiety   Makes her have a panic attack   Codeine Nausea Only   Dizziness   Methylphenidate Hcl    "Caused a seizure"   Zoloft [sertraline] Other (See Comments)   Sleepiness   Magnevist [gadopentetate] Hives, Itching   IV Contrast   Sulfa Antibiotics Rash        Medication List     STOP taking these medications    ACTIVATED CHARCOAL PO   acyclovir 200 MG capsule Commonly known as: ZOVIRAX   cyclobenzaprine 10 MG tablet Commonly known as: FLEXERIL   enoxaparin 40 MG/0.4ML injection Commonly known as: LOVENOX   gabapentin  600 MG tablet Commonly known as: NEURONTIN   ipratropium 0.06 % nasal spray Commonly known as: ATROVENT   naloxone 4 MG/0.1ML Liqd nasal spray kit Commonly known as: NARCAN   oxyCODONE 5 MG immediate release tablet Commonly known as: Oxy IR/ROXICODONE       TAKE these medications    Accu-Chek  Guide test strip Generic drug: glucose blood Use to check blood sugar 1 x daily as instructed   Accu-Chek Guide w/Device Kit 1 Device by Does not apply route 4 (four) times daily. DX E11.69   Accu-Chek Softclix Lancets lancets TEST TWO TO THREE TIMES DAILY DX E11.69   acetaminophen 325 MG tablet Commonly known as: TYLENOL Take 2 tablets (650 mg total) by mouth every 6 (six) hours as needed for mild pain, moderate pain, fever or headache (or Fever >/= 101).   aerochamber plus with mask inhaler Use with inhaler as needed.   albuterol 108 (90 Base) MCG/ACT inhaler Commonly known as: VENTOLIN HFA INHALE 2 INHALATIONS INTO THE LUNGS EVERY 4 HOURS AS NEEDED   amLODipine 5 MG tablet Commonly known as: NORVASC Take 1 tablet (5 mg total) by mouth daily. Start taking on: June 12, 2023   Apixaban Starter Pack (10mg  and 5mg ) Commonly known as: ELIQUIS STARTER PACK Take as directed on package: start with two-5mg  tablets twice daily for 7 days. On day 8, switch to one-5mg  tablet twice daily.   atorvastatin 40 MG tablet Commonly known as: LIPITOR Take 1 tablet (40 mg total) by mouth at bedtime.   azelastine 0.1 % nasal spray Commonly known as: ASTELIN Place 1 spray into both nostrils 2 (two) times daily.   bisacodyl 5 MG EC tablet Commonly known as: DULCOLAX Take 1 tablet (5 mg total) by mouth daily as needed for moderate constipation.   busPIRone 5 MG tablet Commonly known as: BUSPAR Take 1 tablet (5 mg total) by mouth 2 (two) times daily.   carvedilol 6.25 MG tablet Commonly known as: COREG Take 1 tablet (6.25 mg total) by mouth 2 (two) times daily with a meal.   Cholecalciferol 125 MCG (5000 UT) Tabs Take 1 tablet by mouth daily.   ICY HOT MAX LIDOCAINE EX Apply 1 application. topically daily as needed (pain).   levETIRAcetam 500 MG tablet Commonly known as: KEPPRA Take 1 tablet (500 mg total) by mouth at bedtime.   LORazepam 1 MG tablet Commonly known as:  ATIVAN Take 0.5-1 tablets (0.5-1 mg total) by mouth daily as needed for anxiety.   LUBRICATING EYE DROPS OP Place 1 drop into both eyes daily as needed (dry eyes).   omeprazole 40 MG capsule Commonly known as: PRILOSEC Take 1 capsule (40 mg total) by mouth daily before breakfast. What changed: when to take this   potassium chloride SA 20 MEQ tablet Commonly known as: KLOR-CON M Take 1 tablet (20 mEq total) by mouth daily for 3 days. Start taking on: June 12, 2023   senna 8.6 MG Tabs tablet Commonly known as: SENOKOT Take 1 tablet (8.6 mg total) by mouth 2 (two) times daily.   valsartan 320 MG tablet Commonly known as: DIOVAN Take 1 tablet (320 mg total) by mouth daily.   Vilazodone HCl 40 MG Tabs Commonly known as: VIIBRYD Take 1 tablet (40 mg total) by mouth daily.        Follow-up Information     Smitty Cords, DO Follow up in 5 day(s).   Specialty: Family Medicine Contact information: [address] [place]  [address] [phone]         Juanell Fairly, MD Follow up.   Specialty: Orthopedic Surgery Why: keep already scheduled appointment one week Contact information: [address] [place] [address] [phone]                Discharge Exam: Ceasar Mons Weights   06/09/23 2059 06/11/23 0500  Weight: 81.1 kg 80.8 kg   Physical Exam HENT:     Head: Normocephalic.     Mouth/Throat:     Pharynx: No oropharyngeal exudate.  Eyes:     General: Lids are normal.     Conjunctiva/sclera: Conjunctivae normal.  Cardiovascular:     Rate and Rhythm: Normal rate and regular rhythm.     Heart sounds: Normal heart sounds, S1 normal and S2 normal.  Pulmonary:     Breath sounds: Examination of the right-lower field reveals decreased breath sounds. Examination of the left-lower field reveals decreased breath sounds. Decreased breath sounds present. No wheezing, rhonchi or rales.  Abdominal:     Palpations: Abdomen is soft.     Tenderness:  There is no abdominal tenderness.  Musculoskeletal:     Right lower leg: No swelling.     Left lower leg: No swelling.  Skin:    General: Skin is warm.     Findings: No rash.  Neurological:     Mental Status: She is alert.     Comments: Answers questions appropriately.  Able to straight leg raise.      Condition at discharge: stable  The results of significant diagnostics from this hospitalization (including imaging, microbiology, ancillary and laboratory) are listed below for reference.   Imaging Studies: DG Knee Left Port  Result Date: 06/11/2023 CLINICAL DATA:  Status post left knee arthroplasty EXAM: PORTABLE LEFT KNEE - 1-2 VIEW COMPARISON:  06/05/2023 FINDINGS: There is evidence of recent left knee arthroplasty. There is interval clearing of pockets of air in the soft tissues. Skin staples are still noted along the anterior aspect. IMPRESSION: Left knee arthroplasty.  No acute findings are seen. Electronically Signed   By: Ernie Avena M.D.   On: 06/11/2023 14:40   US Venous Img Lower Bilateral (DVT)  Result Date: 06/10/2023 CLINICAL DATA:  Lower extremity swelling.  Pulmonary embolus. EXAM: BILATERAL LOWER EXTREMITY VENOUS DOPPLER ULTRASOUND BILATERAL LOWER EXTREMITY VENOUS DOPPLER ULTRASOUND TECHNIQUE: Gray-scale sonography with graded compression, as well as color Doppler and duplex ultrasound were performed to evaluate the lower extremity deep venous systems from the level of the common femoral vein and including the common femoral, femoral, profunda femoral, popliteal and calf veins including the posterior tibial, peroneal and gastrocnemius veins when visible. The superficial great saphenous vein was also interrogated. Spectral Doppler was utilized to evaluate flow at rest and with distal augmentation maneuvers in the common femoral, femoral and popliteal veins. COMPARISON:  None Available. FINDINGS: RIGHT LOWER EXTREMITY Common Femoral Vein: No evidence of thrombus. Normal  compressibility, respiratory phasicity and response to augmentation. Saphenofemoral Junction: No evidence of thrombus. Normal compressibility and flow on color Doppler imaging. Profunda Femoral Vein: No evidence of thrombus. Normal compressibility and flow on color Doppler imaging. Femoral Vein: No evidence of thrombus. Normal compressibility, respiratory phasicity and response to augmentation. Popliteal Vein: No evidence of thrombus. Normal compressibility, respiratory phasicity and response to augmentation. Calf Veins: No evidence of thrombus. Normal compressibility and flow on color Doppler imaging. Other Findings:  None. LEFT LOWER EXTREMITY Common Femoral Vein: No evidence of thrombus. Normal compressibility, respiratory phasicity and response  to augmentation. Saphenofemoral Junction: No evidence of thrombus. Normal compressibility and flow on color Doppler imaging. Profunda Femoral Vein: No evidence of thrombus. Normal compressibility and flow on color Doppler imaging. Femoral Vein: No evidence of thrombus. Normal compressibility, respiratory phasicity and response to augmentation. Popliteal Vein: Not visualized due to knee replacement and patient tolerance. Calf Veins: Not visualized due to knee replacement and patient tolerance. Other Findings:  None. IMPRESSION: 1. No evidence for acute DVT in the right lower extremity. 2. No evidence for acute DVT in the left common femoral vein, femoral vein, or profundus femoral vein. Left popliteal vein and left calf veins could not be visualized due to the patient's recent knee replacement and discomfort. Electronically Signed   By: Kennith Center M.D.   On: 06/10/2023 11:23   CT Angio Chest Pulmonary Embolism (PE) W or WO Contrast  Result Date: 06/10/2023 CLINICAL DATA:  High probability pulmonary embolism EXAM: CT ANGIOGRAPHY CHEST WITH CONTRAST TECHNIQUE: Multidetector CT imaging of the chest was performed using the standard protocol during bolus administration  of intravenous contrast. Multiplanar CT image reconstructions and MIPs were obtained to evaluate the vascular anatomy. RADIATION DOSE REDUCTION: This exam was performed according to the departmental dose-optimization program which includes automated exposure control, adjustment of the mA and/or kV according to patient size and/or use of iterative reconstruction technique. CONTRAST:  75mL OMNIPAQUE IOHEXOL 350 MG/ML SOLN COMPARISON:  Radiographs 06/09/2023 FINDINGS: Cardiovascular: Acute pulmonary embolism in a subsegmental branch of the right upper lobe (4/32 and 7/85). No evidence of right heart strain. Cardiomegaly. Small pericardial effusion. Mediastinum/Nodes: Small hiatal hernia. Patent trachea. No thoracic adenopathy. Lungs/Pleura: Mosaic attenuation of the lungs compatible with air trapping. No focal consolidation, pleural effusion, or pneumothorax. Upper Abdomen: No acute abnormality. Musculoskeletal: No acute fracture or destructive osseous lesion. Review of the MIP images confirms the above findings. IMPRESSION: 1. Acute pulmonary embolism in a subsegmental branch of the right upper lobe. No evidence of right heart strain. 2. Cardiomegaly with small pericardial effusion. 3. Mosaic attenuation of the lungs compatible with air trapping. 4. Small hiatal hernia. Critical Value/emergent results were called by telephone at the time of interpretation on 06/10/2023 at 2:47 am to provider Dr. Para March, Who verbally acknowledged these results. Electronically Signed   By: Minerva Fester M.D.   On: 06/10/2023 02:47   MR BRAIN WO CONTRAST  Result Date: 06/10/2023 CLINICAL DATA:  Delirium EXAM: MRI HEAD WITHOUT CONTRAST TECHNIQUE: Multiplanar, multiecho pulse sequences of the brain and surrounding structures were obtained without intravenous contrast. COMPARISON:  06/06/2023 FINDINGS: Brain: No acute infarct, mass effect or extra-axial collection. No acute or chronic hemorrhage. Normal white matter signal, parenchymal  volume and CSF spaces. Incidental subcentimeter pineal cyst, no follow-up required. Vascular: Major flow voids are preserved. Skull and upper cervical spine: Normal calvarium and skull base. Visualized upper cervical spine and soft tissues are normal. Sinuses/Orbits:No paranasal sinus fluid levels or advanced mucosal thickening. No mastoid or middle ear effusion. Normal orbits. IMPRESSION: Normal brain MRI. Electronically Signed   By: Deatra Robinson M.D.   On: 06/10/2023 01:10   DG Chest Portable 1 View  Result Date: 06/09/2023 CLINICAL DATA:  Status post recent knee replacement with subsequent altered mental status. EXAM: PORTABLE CHEST 1 VIEW COMPARISON:  July 24, 2012 FINDINGS: The heart size and mediastinal contours are within normal limits. There is no evidence of an acute infiltrate, pleural effusion or pneumothorax. The visualized skeletal structures are unremarkable. IMPRESSION: No active disease. Electronically Signed   By:  Aram Candela M.D.   On: 06/09/2023 23:15   CT Head Wo Contrast  Result Date: 06/09/2023 CLINICAL DATA:  Altered level of consciousness, anxiety, confusion EXAM: CT HEAD WITHOUT CONTRAST TECHNIQUE: Contiguous axial images were obtained from the base of the skull through the vertex without intravenous contrast. RADIATION DOSE REDUCTION: This exam was performed according to the departmental dose-optimization program which includes automated exposure control, adjustment of the mA and/or kV according to patient size and/or use of iterative reconstruction technique. COMPARISON:  06/06/2023 FINDINGS: Brain: No acute infarct or hemorrhage. Lateral ventricles and midline structures are unremarkable. No acute extra-axial fluid collections. No mass effect. Vascular: No hyperdense vessel or unexpected calcification. Skull: Postsurgical changes from prior suboccipital craniectomy. No acute bony abnormalities. Sinuses/Orbits: No acute finding. Other: None. IMPRESSION: 1. No acute  intracranial process. Electronically Signed   By: Sharlet Salina M.D.   On: 06/09/2023 23:06   MR BRAIN WO CONTRAST  Result Date: 06/07/2023 CLINICAL DATA:  Provided history: Mental status change, unknown cause. EXAM: MRI HEAD WITHOUT CONTRAST TECHNIQUE: Multiplanar, multiecho pulse sequences of the brain and surrounding structures were obtained without intravenous contrast. COMPARISON:  Head CT 06/06/2023.  Brain MRI 05/28/2010. FINDINGS: Brain: No age advanced or lobar predominant parenchymal atrophy. Small pineal cyst. No cortical encephalomalacia is identified. No significant cerebral white matter disease for age. There is no acute infarct. No evidence of an intracranial mass. No chronic intracranial blood products. No extra-axial fluid collection. No midline shift. Vascular: Maintained flow voids within the proximal large arterial vessels. Skull and upper cervical spine: No focal suspicious marrow lesion. Sinuses/Orbits: No mass or acute finding within the imaged orbits. No significant paranasal sinus disease. IMPRESSION: 1.  No evidence of an acute intracranial abnormality. 2. Small pineal cyst. Otherwise unremarkable non-contrast MRI appearance of the brain for age. Electronically Signed   By: Jackey Loge D.O.   On: 06/07/2023 07:38   CT HEAD WO CONTRAST ( )  Result Date: 06/06/2023 CLINICAL DATA:  Mental status change EXAM: CT HEAD WITHOUT CONTRAST TECHNIQUE: Contiguous axial images were obtained from the base of the skull through the vertex without intravenous contrast. RADIATION DOSE REDUCTION: This exam was performed according to the departmental dose-optimization program which includes automated exposure control, adjustment of the mA and/or kV according to patient size and/or use of iterative reconstruction technique. COMPARISON:  Head CT 06/18/2010 FINDINGS: Brain: No evidence of acute infarction, hemorrhage, hydrocephalus, extra-axial collection or mass lesion/mass effect. Vascular: No  hyperdense vessel or unexpected calcification. Skull: Normal. Negative for fracture or focal lesion. Sinuses/Orbits: No acute finding. Other: None. IMPRESSION: No acute intracranial abnormality. Electronically Signed   By: Darliss Cheney M.D.   On: 06/06/2023 18:47   DG Knee Left Port  Result Date: 06/05/2023 CLINICAL DATA:  Status post left total knee replacement. EXAM: PORTABLE LEFT KNEE - 1-2 VIEW COMPARISON:  None Available. FINDINGS: Sequelae of recent total knee arthroplasty are identified. No acute fracture or dislocation is evident. Gas is noted in the knee joint and surrounding soft tissues, and skin staples are in place. IMPRESSION: Left total knee arthroplasty without evidence of acute complication. Electronically Signed   By: Sebastian Ache M.D.   On: 06/05/2023 12:56    Microbiology: Results for orders placed or performed during the hospital encounter of 06/09/23  C Difficile Quick Screen w PCR reflex     Status: None   Collection Time: 06/11/23  8:56 AM   Specimen: STOOL  Result Value Ref Range Status   C  Diff antigen NEGATIVE NEGATIVE Final   C Diff toxin NEGATIVE NEGATIVE Final   C Diff interpretation No C. difficile detected.  Final    Comment: Performed at [facility], [address] [place], [place] [address]    Labs: CBC: Recent Labs  Lab 06/07/23 [phone] 06/09/23 2100 06/10/23 0137 06/11/23 0535  WBC 10.0 10.7* 8.9 7.8  NEUTROABS 7.3 8.7*  --   --   HGB 8.6* 9.8* 8.9* 9.7*  HCT 26.9* 31.2* 28.4* 30.2*  MCV 86.2 86.9 88.5 85.3  PLT 243 358 319 358   Basic Metabolic Panel: Recent Labs  Lab 06/06/23 1651 06/07/23 0648 06/09/23 2100 06/10/23 0137 06/11/23 0531 06/11/23 0535  NA 136 140 134* 139  --  140  K 3.6 3.2* 3.0* 3.8  --  2.9*  CL 102 106 98 103  --  104  CO2 25 26 26 28   --  26  GLUCOSE 118* 117* 136* 117*  --  117*  BUN 23 22 11 11   --  9  CREATININE 1.14* 0.84 0.65 0.61  --  0.48  CALCIUM 7.9* 8.1* 8.1* 8.4*  --  8.4*  MG  --   --    --  2.1 2.1  --    Liver Function Tests: Recent Labs  Lab 06/09/23 2100 06/10/23 0137  AST 19 19  ALT 15 13  ALKPHOS 71 63  BILITOT 0.9 1.0  PROT 7.1 6.4*  ALBUMIN 3.6 3.1*   CBG: Recent Labs  Lab 06/10/23 0722 06/10/23 1724 06/10/23 2252 06/11/23 0910 06/11/23 1146  GLUCAP 126* 112* 120* 123* 123*    Discharge time spent: greater than 30 minutes.  Signed: Alford Highland, MD [facility] 06/11/2023

## 2023-06-11 NOTE — Evaluation (Signed)
Occupational Therapy Evaluation Patient Details Name: Amanda Wang MRN: [id number] DOB: 03/02/1951 Today's Date: 06/11/2023   History of Present Illness Pt is a 72 yo F diagnosed with left knee osteoarthritis and is s/p elective L TKA.  PMH includes: HTN, anxiety, depression, anemia, and DM.   Clinical Impression   Pt was seen for OT evaluation this date. Prior to hospital admission, pt was recently home with daughter assisting following TKA. Prior to TKA pt was independent. Pt presents to acute OT demonstrating impaired ADL performance and functional mobility 2/2 mild cognitive deficits (primarily impacting safety), decr balance, and LLE strength (See OT problem list for additional functional deficits). Pt currently requires supv for bed mobility, CGA for ADL transfers and in-room mobility with RW and PRN VC for hand placement/safety. Pt completed toileting with supv and required assist for doffing knee immobilizer. Pt would benefit from skilled OT services to address noted impairments and functional limitations (see below for any additional details) in order to maximize safety and independence while minimizing falls risk and caregiver burden. Encourage some increased supv/support available initially upon return home for safety.    Recommendations for follow up therapy are one component of a multi-disciplinary discharge planning process, led by the attending physician.  Recommendations may be updated based on patient status, additional functional criteria and insurance authorization.   Assistance Recommended at Discharge Frequent or constant Supervision/Assistance  Patient can return home with the following A little help with walking and/or transfers;A little help with bathing/dressing/bathroom;Help with stairs or ramp for entrance;Assist for transportation;Assistance with cooking/housework    Functional Status Assessment  Patient has had a recent decline in their functional status and  demonstrates the ability to make significant improvements in function in a reasonable and predictable amount of time.  Equipment Recommendations  None recommended by OT    Recommendations for Other Services       Precautions / Restrictions Precautions Precautions: Knee Required Braces or Orthoses: Knee Immobilizer - Left Knee Immobilizer - Left: Other (comment) (off with mobility) Restrictions Weight Bearing Restrictions: Yes LLE Weight Bearing: Weight bearing as tolerated      Mobility Bed Mobility Overal bed mobility: Needs Assistance Bed Mobility: Supine to Sit     Supine to sit: Supervision     General bed mobility comments: no overt difficulty, attempted to get out with bed rail up requiring cue to pause so OT could lower it    Transfers Overall transfer level: Needs assistance Equipment used: Rolling walker (2 wheels) Transfers: Sit to/from Stand Sit to Stand: Min guard           General transfer comment: VC for hand placement      Balance Overall balance assessment: Needs assistance Sitting-balance support: Feet supported, No upper extremity supported Sitting balance-Leahy Scale: Good     Standing balance support: During functional activity, Reliant on assistive device for balance, No upper extremity supported, Single extremity supported Standing balance-Leahy Scale: Fair Standing balance comment: tolerated static standing with fair balance for donning undergarments over hips and washing hands at the sink                           ADL either performed or assessed with clinical judgement   ADL Overall ADL's : Needs assistance/impaired     Grooming: Standing;Supervision/safety;Wash/dry hands               Lower Body Dressing: Sit to/from stand;Minimal assistance   Toilet Transfer: Min  guard;BSC/3in1;Rolling walker (2 wheels);Ambulation   Toileting- Clothing Manipulation and Hygiene: Set up;Supervision/safety;Sitting/lateral lean        Functional mobility during ADLs: Min guard;Rolling walker (2 wheels)       Vision         Perception     Praxis      Pertinent Vitals/Pain Pain Assessment Pain Assessment: Faces Faces Pain Scale: Hurts a little bit Pain Location: L knee with mobility Pain Descriptors / Indicators: Aching, Guarding Pain Intervention(s): Limited activity within patient's tolerance, Monitored during session, Repositioned     Hand Dominance     Extremity/Trunk Assessment Upper Extremity Assessment Upper Extremity Assessment: Overall WFL for tasks assessed   Lower Extremity Assessment Lower Extremity Assessment: Defer to PT evaluation LLE Deficits / Details: s/p L TKA, WBAT, KI on while at rest.       Communication Communication Communication: No difficulties   Cognition Arousal/Alertness: Awake/alert Behavior During Therapy: WFL for tasks assessed/performed Overall Cognitive Status: Impaired/Different from baseline Area of Impairment: Safety/judgement                         Safety/Judgement: Decreased awareness of deficits, Decreased awareness of safety     General Comments: mild confusion, PRN VC for safety     General Comments       Exercises     Shoulder Instructions      Home Living Family/patient expects to be discharged to:: Private residence Living Arrangements: Alone Available Help at Discharge: Family;Available 24 hours/day Type of Home: House Home Access: Stairs to enter [employer] of Steps: 4 Entrance Stairs-Rails: Right;Left;Can reach both Home Layout: One level     Bathroom Shower/Tub: Chief Strategy Officer: Standard     Home Equipment: Retail banker (4 wheels);Shower seat;Grab bars - tub/shower;Cane - single point          Prior Functioning/Environment Prior Level of Function : Independent/Modified Independent;Driving             Mobility Comments: Ind amb without an AD limited community  distances, limited by L knee pain, drives, no fall history ADLs Comments: Active, independent with ADL management, driving.        OT Problem List: Decreased strength;Decreased coordination;Decreased range of motion;Decreased cognition;Decreased activity tolerance;Decreased safety awareness;Impaired balance (sitting and/or standing);Decreased knowledge of use of DME or AE;Decreased knowledge of precautions      OT Treatment/Interventions: Therapeutic exercise;Self-care/ADL training;Therapeutic activities;DME and/or AE instruction;Patient/family education;Balance training    OT Goals(Current goals can be found in the care plan section) Acute Rehab OT Goals Patient Stated Goal: go home OT Goal Formulation: With patient Time For Goal Achievement: 06/25/23 Potential to Achieve Goals: Good ADL Goals Pt Will Perform Lower Body Dressing: with modified independence;with adaptive equipment;sit to/from stand Pt Will Transfer to Toilet: with modified independence;ambulating (LRAD)  OT Frequency: Min 2X/week    Co-evaluation              AM-PAC OT "6 Clicks" Daily Activity     Outcome Measure Help from another person eating meals?: None Help from another person taking care of personal grooming?: None Help from another person toileting, which includes using toliet, bedpan, or urinal?: A Little Help from another person bathing (including washing, rinsing, drying)?: A Little Help from another person to put on and taking off regular upper body clothing?: None Help from another person to put on and taking off regular lower body clothing?: A Little 6 Click Score: 21  End of Session Equipment Utilized During Treatment: Left knee immobilizer;Rolling walker (2 wheels) (KI removed prior to mobility) Nurse Communication: Mobility status  Activity Tolerance: Patient tolerated treatment well Patient left: in chair;with call bell/phone within reach;with chair alarm set;Other (comment) (PT in for  session)  OT Visit Diagnosis: Other abnormalities of gait and mobility (R26.89)                Time: 1610-9604 OT Time Calculation (min): 21 min Charges:  OT General Charges $OT Visit: 1 Visit OT Evaluation $OT Eval Low Complexity: 1 Low OT Treatments $Self Care/Home Management : 8-22 mins  Arman Filter., MPH, MS, OTR/L ascom [phone] 06/11/23, 10:17 AM

## 2023-06-11 NOTE — Evaluation (Addendum)
Physical Therapy Evaluation Patient Details Name: Amanda Wang MRN: [id number] DOB: 11-20-1951 Today's Date: 06/11/2023  History of Present Illness  Pt is a 72 y.o. female presenting to hospital 06/09/23 with post op confusion (recent TKA 6/18 with Dr. Martha Clan).  Imaging showing very tiny subsegmental PE in R upper lobe.  Pt admitted with acute delirium and acute PE.  PMH includes DM, htn, iron deficiency anemia, Chiari malformation type I, h/o hiatal hernia, IBS, PONV, restless legs, sleep apnea, spinal stenosis.  Clinical Impression  Since recent knee surgery, pt has been ambulating with RW; lives alone in 1 level home with 4 STE B railings; pt has assist/support as needed.  Pt A&O x4 during session.  Currently pt is SBA with transfers and CGA to ambulate 180 feet with RW use.  Pt would currently benefit from skilled PT to address noted impairments and functional limitations (see below for any additional details).  Upon hospital discharge, pt would benefit from ongoing therapy (pt reports her home health PT had not started yet).    Recommendations for follow up therapy are one component of a multi-disciplinary discharge planning process, led by the attending physician.  Recommendations may be updated based on patient status, additional functional criteria and insurance authorization.        Assistance Recommended at Discharge Intermittent Supervision/Assistance  Patient can return home with the following  A little help with walking and/or transfers;A little help with bathing/dressing/bathroom;Assistance with cooking/housework;Assist for transportation;Help with stairs or ramp for entrance    Equipment Recommendations Rolling walker (2 wheels);BSC/3in1  Recommendations for Other Services       Functional Status Assessment Patient has had a recent decline in their functional status and demonstrates the ability to make significant improvements in function in a reasonable and predictable  amount of time.     Precautions / Restrictions Precautions Precautions: Knee;Fall Required Braces or Orthoses: Knee Immobilizer - Left Knee Immobilizer - Left: Other (comment) (on at rest; off with mobility) Restrictions Weight Bearing Restrictions: Yes LLE Weight Bearing: Weight bearing as tolerated      Mobility  Bed Mobility               General bed mobility comments: Deferred (pt in recliner beginning/end of session)    Transfers Overall transfer level: Needs assistance Equipment used: Rolling walker (2 wheels) Transfers: Sit to/from Stand Sit to Stand: Supervision           General transfer comment: steady safe transfer from recliner with RW use    Ambulation/Gait Ambulation/Gait assistance: Min guard Gait Distance (Feet): 180 Feet Assistive device: Rolling walker (2 wheels)   Gait velocity: decreased     General Gait Details: antalgic; decreased stance time L LE; step to progressing to step through gait pattern; steady with RW use  Stairs            Wheelchair Mobility    Modified Rankin (Stroke Patients Only)       Balance Overall balance assessment: Needs assistance Sitting-balance support: No upper extremity supported, Feet supported Sitting balance-Leahy Scale: Normal Sitting balance - Comments: steady reaching outside BOS   Standing balance support: Bilateral upper extremity supported, During functional activity, Reliant on assistive device for balance Standing balance-Leahy Scale: Good Standing balance comment: steady ambulating with RW use                             Pertinent Vitals/Pain Pain Assessment Pain Assessment: Faces Faces  Pain Scale: Hurts a little bit (2/10 at rest; 4/10 with activity) Pain Location: L knee with mobility Pain Descriptors / Indicators: Aching, Sore, Discomfort Pain Intervention(s): Limited activity within patient's tolerance, Monitored during session, Repositioned Vitals (HR and O2 on  room air) stable and WFL throughout treatment session.    Home Living Family/patient expects to be discharged to:: Private residence Living Arrangements: Alone Available Help at Discharge: Family;Available 24 hours/day Type of Home: House Home Access: Stairs to enter Entrance Stairs-Rails: Right;Left;Can reach both Entrance Stairs-Number of Steps: 4   Home Layout: One level Home Equipment: Standard Walker;Rollator (4 wheels);Shower seat;Grab bars - tub/shower;Cane - single Librarian, academic (2 wheels);BSC/3in1 Additional Comments: Pt's daughter working during the day but can assist as night; pt's ex-husband has taken off of work to help pt as needed.    Prior Function Prior Level of Function : Independent/Modified Independent;Driving             Mobility Comments: Prior to recent knee surgery, pt was independent with ambulation without an AD limited community distances (limited by L knee pain), drives, no fall history.  Since knee surgery, pt has been using RW. ADLs Comments: Prior to recent knee surgery, pt was active, independent with ADL management, driving.     Hand Dominance        Extremity/Trunk Assessment   Upper Extremity Assessment Upper Extremity Assessment: Overall WFL for tasks assessed    Lower Extremity Assessment Lower Extremity Assessment:  (R LE WFL) LLE Deficits / Details: able to perform L LE SLR independently; at least 3/5 AROM hip flexion and ankle DF/PF; good L quad set isometric strength    Cervical / Trunk Assessment Cervical / Trunk Assessment: Normal  Communication   Communication: No difficulties  Cognition Arousal/Alertness: Awake/alert Behavior During Therapy: WFL for tasks assessed/performed Overall Cognitive Status: Within Functional Limits for tasks assessed                                 General Comments: A&O x4        General Comments  Nursing cleared pt for participation in physical therapy.  Pt agreeable to  PT session.  MD Wieting cleared pt for therapy participation (d/t concerns regarding acute PE).    Exercises     Assessment/Plan    PT Assessment Patient needs continued PT services  PT Problem List Decreased strength;Decreased range of motion;Decreased activity tolerance;Decreased balance;Decreased mobility;Decreased knowledge of use of DME;Pain;Decreased cognition       PT Treatment Interventions DME instruction;Gait training;Stair training;Functional mobility training;Therapeutic activities;Therapeutic exercise;Balance training;Patient/family education    PT Goals (Current goals can be found in the Care Plan section)  Acute Rehab PT Goals Patient Stated Goal: to improve pain and mobility PT Goal Formulation: With patient Time For Goal Achievement: 06/25/23 Potential to Achieve Goals: Good    Frequency 7X/week     Co-evaluation               AM-PAC PT "6 Clicks" Mobility  Outcome Measure Help needed turning from your back to your side while in a flat bed without using bedrails?: None Help needed moving from lying on your back to sitting on the side of a flat bed without using bedrails?: None Help needed moving to and from a bed to a chair (including a wheelchair)?: A Little Help needed standing up from a chair using your arms (e.g., wheelchair or bedside chair)?: A Little Help needed  to walk in hospital room?: A Little Help needed climbing 3-5 steps with a railing? : A Little 6 Click Score: 20    End of Session Equipment Utilized During Treatment: Gait belt Activity Tolerance: Patient tolerated treatment well Patient left: in chair;with call bell/phone within reach;with chair alarm set (L KI in place) Nurse Communication: Mobility status;Weight bearing status;Precautions PT Visit Diagnosis: Other abnormalities of gait and mobility (R26.89);Muscle weakness (generalized) (M62.81);Pain Pain - Right/Left: Left Pain - part of body: Knee    Time: 6063-0160 PT Time  Calculation (min) (ACUTE ONLY): 35 min   Charges:   PT Evaluation $PT Eval Low Complexity: 1 Low PT Treatments $Therapeutic Activity: 8-22 mins       Hendricks Limes, PT 06/11/23, 12:48 PM

## 2023-06-11 NOTE — TOC Initial Note (Signed)
Transition of Care [facility]) - Initial/Assessment Note    Patient Details  Name: Amanda Wang MRN: [id number] Date of Birth: September 11, 1951  [facility] CM/SW Contact:    Chapman Fitch, RN Phone Number: 06/11/2023, 12:32 PM  Clinical Narrative:                 Patient A&O x2 VM left for daughter Per chart review   Admitted for: AMS Admitted from: home.  Daughter and ex husband have been providing support PCP: Althea Charon Current home health/prior home health/DME: Shower seat;Grab bars - tub/shower;Cane - single Librarian, academic (2 wheels);BSC/3in1   Therapy recommending home health.  Patient active with [facility] health. Message sent to [facility] with Cresenciano Genre to determine what services patient is active with    Expected Discharge Plan: Home w Home Health Services     Patient Goals and CMS Choice            Expected Discharge Plan and Services                                              Prior Living Arrangements/Services                       Activities of Daily Living Home Assistive Devices/Equipment: Brace (specify type) ADL Screening (condition at time of admission) Patient's cognitive ability adequate to safely complete daily activities?: No Is the patient deaf or have difficulty hearing?: No Does the patient have difficulty seeing, even when wearing glasses/contacts?: No Does the patient have difficulty concentrating, remembering, or making decisions?: Yes Patient able to express need for assistance with ADLs?: No Does the patient have difficulty dressing or bathing?: Yes Independently performs ADLs?: Yes (appropriate for developmental age) Does the patient have difficulty walking or climbing stairs?: Yes Weakness of Legs: Both Weakness of Arms/Hands: Both  Permission Sought/Granted                  Emotional Assessment              Admission diagnosis:  Delirium [R41.0] Acute metabolic encephalopathy  [G93.41] AMS (altered mental status) [R41.82] Patient Active Problem List   Diagnosis Date Noted   Acute delirium 06/10/2023   Acute pulmonary embolism (HCC) 06/10/2023   Acute metabolic encephalopathy 06/10/2023   S/P TKR (total knee replacement) using cement, left 06/05/2023   Cervical lymphadenopathy 02/27/2023   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   Absolute anemia 02/23/2023   Morbid obesity (HCC) 12/12/2022   PTSD (post-traumatic stress disorder) 04/25/2022   Need for influenza vaccination 12/30/2021   Need for pneumococcal vaccination 12/30/2021   Hyperlipidemia 12/30/2021   Iron deficiency anemia 12/30/2021   Essential hypertension 09/19/2021   Long term prescription benzodiazepine use 01/11/2021   Type 2 diabetes mellitus with hyperlipidemia (HCC) 07/22/2020   Allergic rhinitis due to allergen 07/16/2020   Irritable bowel syndrome with diarrhea    Benign neoplasm of transverse colon    GERD (gastroesophageal reflux disease) 09/16/2018   Hiatal hernia 07/29/2018   Restless legs 03/07/2018   Chiari malformation type I (HCC) 08/07/2017   Anxiety 08/07/2017   History of bariatric surgery 02/01/2017   Depression, recurrent (HCC)    PCP:  Smitty Cords, DO Pharmacy:   [facility] - [place], [place] - [address]  [place] [address] Phone: [phone] Fax: [phone]  [id number] [place], [place] - [address] AT [facility] [address] [place] [address] Phone: [phone] Fax: [phone]     Social Determinants of Health (SDOH) Social History: SDOH Screenings   Food Insecurity: No Food Insecurity (06/10/2023)  Housing: Low Risk  (06/10/2023)  Transportation Needs: No Transportation Needs (06/10/2023)  Utilities: Not At Risk (06/10/2023)  Depression (PHQ2-9): Medium Risk (02/23/2023)  Financial Resource Strain: Low Risk  (10/17/2021)  Physical Activity: Inactive  (10/17/2021)  Social Connections: Moderately Integrated (09/22/2019)  Stress: Stress Concern Present (10/17/2021)  Tobacco Use: Medium Risk (06/09/2023)   SDOH Interventions:     Readmission Risk Interventions     No data to display

## 2023-06-12 ENCOUNTER — Telehealth: Payer: Self-pay | Admitting: *Deleted

## 2023-06-12 ENCOUNTER — Ambulatory Visit: Payer: Medicare PPO

## 2023-06-12 LAB — BLOOD GAS, VENOUS
Acid-Base Excess: 7.6 mmol/L — ABNORMAL HIGH (ref 0.0–2.0)
Bicarbonate: 33.7 mmol/L — ABNORMAL HIGH (ref 20.0–28.0)
pH, Ven: 7.42 (ref 7.25–7.43)

## 2023-06-12 NOTE — Transitions of Care (Post Inpatient/ED Visit) (Signed)
   06/12/2023  Name: Amanda Wang MRN: [id number] DOB: 01-10-1951  Today's TOC FU Call Status: Today's TOC FU Call Status:: Unsuccessul Call (1st Attempt) Unsuccessful Call (1st Attempt) Date: 06/12/23  Attempted to reach the patient regarding the most recent Inpatient/ED visit.  Follow Up Plan: Additional outreach attempts will be made to reach the patient to complete the Transitions of Care (Post Inpatient/ED visit) call.   Gean Maidens BSN RN [facility] [phone]

## 2023-06-13 DIAGNOSIS — G9341 Metabolic encephalopathy: Secondary | ICD-10-CM | POA: Diagnosis not present

## 2023-06-13 DIAGNOSIS — I10 Essential (primary) hypertension: Secondary | ICD-10-CM | POA: Diagnosis not present

## 2023-06-13 DIAGNOSIS — E785 Hyperlipidemia, unspecified: Secondary | ICD-10-CM | POA: Diagnosis not present

## 2023-06-13 DIAGNOSIS — Z96652 Presence of left artificial knee joint: Secondary | ICD-10-CM | POA: Diagnosis not present

## 2023-06-13 DIAGNOSIS — Z471 Aftercare following joint replacement surgery: Secondary | ICD-10-CM | POA: Diagnosis not present

## 2023-06-13 DIAGNOSIS — E1169 Type 2 diabetes mellitus with other specified complication: Secondary | ICD-10-CM | POA: Diagnosis not present

## 2023-06-13 DIAGNOSIS — R410 Disorientation, unspecified: Secondary | ICD-10-CM | POA: Diagnosis not present

## 2023-06-13 DIAGNOSIS — I2699 Other pulmonary embolism without acute cor pulmonale: Secondary | ICD-10-CM | POA: Diagnosis not present

## 2023-06-13 DIAGNOSIS — D509 Iron deficiency anemia, unspecified: Secondary | ICD-10-CM | POA: Diagnosis not present

## 2023-06-14 ENCOUNTER — Telehealth: Payer: Self-pay | Admitting: *Deleted

## 2023-06-14 NOTE — Transitions of Care (Post Inpatient/ED Visit) (Signed)
   06/14/2023  Name: Amanda Wang MRN: [id number] DOB: Dec 05, 1951  Today's TOC FU Call Status: Today's TOC FU Call Status:: Unsuccessful Call (2nd Attempt) Unsuccessful Call (2nd Attempt) Date: 06/14/23  Attempted to reach the patient regarding the most recent Inpatient/ED visit.  Follow Up Plan: Additional outreach attempts will be made to reach the patient to complete the Transitions of Care (Post Inpatient/ED visit) call.   Gean Maidens BSN RN [facility] [phone]

## 2023-06-15 ENCOUNTER — Telehealth: Payer: Self-pay

## 2023-06-15 NOTE — Transitions of Care (Post Inpatient/ED Visit) (Signed)
06/15/2023  Name: Amanda Wang MRN: [id number] DOB: [birth year]  Today's TOC FU Call Status: Today's TOC FU Call Status:: Successful TOC FU Call Competed TOC FU Call Complete Date: 06/15/23  Transition Care Management Follow-up Telephone Call Date of Discharge: 06/11/23 Discharge Facility: [facility]) Type of Discharge: Inpatient Admission Primary Inpatient Discharge Diagnosis:: Acute Delirium How have you been since you were released from the hospital?: Better Any questions or concerns?: No  Items Reviewed: Did you receive and understand the discharge instructions provided?: Yes Medications obtained,verified, and reconciled?: Yes (Medications Reviewed) Any new allergies since your discharge?: No Dietary orders reviewed?: No Do you have support at home?: Yes People in Home: child(ren), adult Name of Support/Comfort Primary Source: [name]  Medications Reviewed Today: Medications Reviewed Today     Reviewed by Jodelle Gross, RN (Case Manager) on 06/15/23 at [phone]  Med List Status: <None>   Medication Order Taking? Sig Documenting Provider Last Dose Status Informant  ACCU-CHEK GUIDE test strip [id number] Yes Use to check blood sugar 1 x daily as instructed Smitty Cords, DO Taking Active Self, Pharmacy Records, Multiple Informants  Accu-Chek Softclix Lancets lancets [id number] Yes TEST TWO TO THREE TIMES DAILY DX E11.69 Smitty Cords, DO Taking Active Self, Pharmacy Records, Multiple Informants  acetaminophen (TYLENOL) 325 MG tablet [id number]  Take 2 tablets (650 mg total) by mouth every 6 (six) hours as needed for mild pain, moderate pain, fever or headache (or Fever >/= 101). Alford Highland, MD  Active   albuterol (VENTOLIN HFA) 108 (90 Base) MCG/ACT inhaler [id number] Yes INHALE 2 INHALATIONS INTO THE LUNGS EVERY 4 HOURS AS NEEDED [provider] Taking Active Self, Pharmacy Records, Multiple Informants  amLODipine  (NORVASC) 5 MG tablet [id number]  Take 1 tablet (5 mg total) by mouth daily. Alford Highland, MD  Active            Med Note Electa Sniff, [facility]   Fri Jun 15, 2023  9:28 AM) Patient did not pick up, she would like to discuss with her PCP prior to taking.  APIXABAN (ELIQUIS) VTE STARTER PACK (10MG  AND 5MG ) [id number] Yes Take as directed on package: start with two-5mg  tablets twice daily for 7 days. On day 8, switch to one-5mg  tablet twice daily. Alford Highland, MD Taking Active   atorvastatin (LIPITOR) 40 MG tablet [id number] Yes Take 1 tablet (40 mg total) by mouth at bedtime. Smitty Cords, DO Taking Active Self, Pharmacy Records, Multiple Informants  azelastine (ASTELIN) 0.1 % nasal spray [id number] Yes Place 1 spray into both nostrils 2 (two) times daily. [provider] Taking Active Self, Pharmacy Records, Multiple Informants  bisacodyl (DULCOLAX) 5 MG EC tablet [id number] No Take 1 tablet (5 mg total) by mouth daily as needed for moderate constipation. Juanell Fairly, MD Unknown Active Self, Pharmacy Records, Multiple Informants  Blood Glucose Monitoring Suppl (ACCU-CHEK GUIDE) w/Device KIT [id number] No 1 Device by Does not apply route 4 (four) times daily. DX E11.69 Smitty Cords, DO Unknown Active Self, Pharmacy Records, Multiple Informants  busPIRone (BUSPAR) 5 MG tablet [id number] No Take 1 tablet (5 mg total) by mouth 2 (two) times daily. Neysa Hotter, MD Unknown Active Self, Pharmacy Records, Multiple Informants  Carboxymethylcellul-Glycerin (LUBRICATING EYE DROPS OP) [id number] No Place 1 drop into both eyes daily as needed (dry eyes). [provider] Unknown Active Self, Pharmacy Records, Multiple Informants  carvedilol (COREG) 6.25 MG tablet [id number] Yes Take 1 tablet (6.25 mg total) by mouth 2 (two) times  daily with a meal. Althea Charon Netta Neat, DO Taking Active Self, Pharmacy Records, Multiple Informants  Cholecalciferol 125 MCG (5000 UT) TABS  [id number] Yes Take 1 tablet by mouth daily. [provider] Taking Active Self, Pharmacy Records, Multiple Informants  levETIRAcetam (KEPPRA) 500 MG tablet [id number] Yes Take 1 tablet (500 mg total) by mouth at bedtime. Alford Highland, MD Taking Active   Lidocaine-Menthol (ICY HOT MAX LIDOCAINE [place]) [id number] No Apply 1 application. topically daily as needed (pain). [provider] Unknown Active Self, Pharmacy Records, Multiple Informants           Med Note Elmarie Shiley Jun 10, 2023  1:33 AM)    LORazepam (ATIVAN) 1 MG tablet [id number] Yes Take 0.5-1 tablets (0.5-1 mg total) by mouth daily as needed for anxiety. Smitty Cords, DO Taking Active Self, Pharmacy Records, Multiple Informants  omeprazole (PRILOSEC) 40 MG capsule [id number] Yes Take 1 capsule (40 mg total) by mouth daily before breakfast.  Patient taking differently: Take 40 mg by mouth at bedtime.   Smitty Cords, DO Taking Active Self, Pharmacy Records, Multiple Informants  potassium chloride SA (KLOR-CON M) 20 MEQ tablet [id number] Yes Take 1 tablet (20 mEq total) by mouth daily for 3 days. Alford Highland, MD Taking Active   senna (SENOKOT) 8.6 MG TABS tablet [id number] No Take 1 tablet (8.6 mg total) by mouth 2 (two) times daily. Juanell Fairly, MD Unknown Active Self, Pharmacy Records, Multiple Informants  Spacer/Aero-Holding Chambers (AEROCHAMBER PLUS WITH MASK) inhaler [id number] Yes Use with inhaler as needed. Smitty Cords, DO Taking Active Self, Pharmacy Records, Multiple Informants  valsartan (DIOVAN) 320 MG tablet [id number] Yes Take 1 tablet (320 mg total) by mouth daily. Smitty Cords, DO Taking Active Self, Pharmacy Records, Multiple Informants  Vilazodone HCl (VIIBRYD) 40 MG TABS [id number] Yes Take 1 tablet (40 mg total) by mouth daily. Neysa Hotter, MD Taking Active Self, Pharmacy Records, Multiple Informants            Home Care and  Equipment/Supplies: Were Home Health Services Ordered?: Yes Name of Home Health Agency:: [facility] Has Agency set up a time to come to your home?: Yes First Home Health Visit Date: 06/13/23 Any new equipment or medical supplies ordered?: No  Functional Questionnaire: Do you need assistance with bathing/showering or dressing?: Yes Do you need assistance with meal preparation?: Yes Do you need assistance with eating?: No Do you have difficulty maintaining continence: No Do you need assistance with getting out of bed/getting out of a chair/moving?: No Do you have difficulty managing or taking your medications?: No  Follow up appointments reviewed: PCP Follow-up appointment confirmed?: No (Patient prefers to make her own appt and will call to set up) MD Provider Line [phone] Given: No Specialist Hospital Follow-up appointment confirmed?: Yes Date of Specialist follow-up appointment?: 06/20/23 Follow-Up Specialty Provider:: Dr. Martha Clan Do you need transportation to your follow-up appointment?: No Do you understand care options if your condition(s) worsen?: Yes-patient verbalized understanding  SDOH Interventions Today    Flowsheet Row Most Recent Value  SDOH Interventions   Food Insecurity Interventions Intervention Not Indicated  Housing Interventions Intervention Not Indicated  Transportation Interventions Intervention Not Indicated  Utilities Interventions Intervention Not Indicated  Financial Strain Interventions Intervention Not Indicated     Jodelle Gross, RN, BSN, CCM Care Management Coordinator [facility] Phone: [phone]/Fax: [phone]

## 2023-06-20 DIAGNOSIS — Z96652 Presence of left artificial knee joint: Secondary | ICD-10-CM | POA: Diagnosis not present

## 2023-06-22 DIAGNOSIS — Z96652 Presence of left artificial knee joint: Secondary | ICD-10-CM | POA: Diagnosis not present

## 2023-06-22 DIAGNOSIS — R410 Disorientation, unspecified: Secondary | ICD-10-CM | POA: Diagnosis not present

## 2023-06-22 DIAGNOSIS — E785 Hyperlipidemia, unspecified: Secondary | ICD-10-CM | POA: Diagnosis not present

## 2023-06-22 DIAGNOSIS — I10 Essential (primary) hypertension: Secondary | ICD-10-CM | POA: Diagnosis not present

## 2023-06-22 DIAGNOSIS — E1169 Type 2 diabetes mellitus with other specified complication: Secondary | ICD-10-CM | POA: Diagnosis not present

## 2023-06-22 DIAGNOSIS — G9341 Metabolic encephalopathy: Secondary | ICD-10-CM | POA: Diagnosis not present

## 2023-06-22 DIAGNOSIS — Z471 Aftercare following joint replacement surgery: Secondary | ICD-10-CM | POA: Diagnosis not present

## 2023-06-22 DIAGNOSIS — I2699 Other pulmonary embolism without acute cor pulmonale: Secondary | ICD-10-CM | POA: Diagnosis not present

## 2023-06-22 DIAGNOSIS — D509 Iron deficiency anemia, unspecified: Secondary | ICD-10-CM | POA: Diagnosis not present

## 2023-06-26 ENCOUNTER — Encounter: Payer: Self-pay | Admitting: Family Medicine

## 2023-06-26 ENCOUNTER — Ambulatory Visit: Payer: Medicare PPO | Admitting: Family Medicine

## 2023-06-26 VITALS — BP 142/80 | HR 79 | Temp 99.4°F | Ht 62.0 in | Wt 174.6 lb

## 2023-06-26 DIAGNOSIS — M25562 Pain in left knee: Secondary | ICD-10-CM | POA: Diagnosis not present

## 2023-06-26 DIAGNOSIS — G8929 Other chronic pain: Secondary | ICD-10-CM

## 2023-06-26 DIAGNOSIS — Z96652 Presence of left artificial knee joint: Secondary | ICD-10-CM

## 2023-06-26 DIAGNOSIS — E876 Hypokalemia: Secondary | ICD-10-CM

## 2023-06-26 DIAGNOSIS — I2699 Other pulmonary embolism without acute cor pulmonale: Secondary | ICD-10-CM

## 2023-06-26 DIAGNOSIS — F05 Delirium due to known physiological condition: Secondary | ICD-10-CM

## 2023-06-26 LAB — CBC WITH DIFFERENTIAL/PLATELET
Absolute Monocytes: 664 cells/uL (ref 200–950)
Basophils Absolute: 17 cells/uL (ref 0–200)
Eosinophils Absolute: 412 cells/uL (ref 15–500)
HCT: 32.8 % — ABNORMAL LOW (ref 35.0–45.0)
Lymphs Abs: 1117 cells/uL (ref 850–3900)
MCH: 28.5 pg (ref 27.0–33.0)
MCHC: 32 g/dL (ref 32.0–36.0)
MPV: 9 fL (ref 7.5–12.5)
Monocytes Relative: 7.9 %
Neutro Abs: 6191 cells/uL (ref 1500–7800)
Platelets: 481 10*3/uL — ABNORMAL HIGH (ref 140–400)
Total Lymphocyte: 13.3 %
WBC: 8.4 10*3/uL (ref 3.8–10.8)

## 2023-06-26 MED ORDER — ELIQUIS 5 MG PO TABS
5.0000 mg | ORAL_TABLET | Freq: Two times a day (BID) | ORAL | 1 refills | Status: DC
Start: 2023-06-26 — End: 2023-09-06

## 2023-06-26 NOTE — Assessment & Plan Note (Signed)
Resolved. Secondary to post-op possibly anesthesia, questions surrounding when symptoms onset, she does not recall 5-6 days post-op even when she was in hospital and coherent. Question is does her recall not cover those days now that she actually did experience delirium and had to go back to hospital, but at the time she was in her right mind post-op. Discussion on this today. Difficult to determine timeline.  Regardless, acute delirium post-op has resolved entirely.

## 2023-06-26 NOTE — Patient Instructions (Addendum)
Thank you for coming to the office today.  Okay to keep taking Tylenol. And good to use the topical since it is not oral and not absorbed as much.  START anti inflammatory topical - OTC Voltaren (generic Diclofenac) topical 2-4 times a day as needed for pain swelling of affected joint for 1-2 weeks or longer.  Eliquis 5mg  twice a day for 2 more months, ending in Early September 2024. Finish next 2 bottles of 60 pills each and then complete course.  Labs today   Please schedule a Follow-up Appointment to: Return if symptoms worsen or fail to improve.  If you have any other questions or concerns, please feel free to call the office or send a message through MyChart. You may also schedule an earlier appointment if necessary.  Additionally, you may be receiving a survey about your experience at our office within a few days to 1 week by e-mail or mail. We value your feedback.  Saralyn Pilar, DO [facility], [place]

## 2023-06-26 NOTE — Assessment & Plan Note (Addendum)
Acute provoked PE, secondary to post-op surgery No DVT No other complication  She is to complete 3 months of Eliquis Has 1 month supply, finishing soon  New order sent for Eliquis 5mg  TWICE A DAY for 2 additional months. START DATE 05/2023 > End DATE 08/2023  Follow-up as needed

## 2023-06-26 NOTE — Progress Notes (Signed)
Subjective:    Patient ID: Amanda Wang, female    DOB: 09-10-1951, 72 y.o.   MRN: [id number]  Amanda Wang is a 72 y.o. female presenting on 06/26/2023 for Hospitalization Follow-up (Lt knee replacement x 1 week. X 4 days later readmitted for delirium )   HPI  HOSPITAL FOLLOW-UP VISIT  Hospital/Location: [facility] Date of Admission: 06/05/23 Date of Discharge: 06/07/23 For L knee TKR surgery replacement. See info below  Repeat Hospitalization Date of Admission: 06/09/23 Date of Discharge: 06/11/23  Transitions of care telephone call: 06/15/23 TOC call completed by Jodelle Gross RN   Reason for Admission: Delirium post op  FOLLOW-UP  [facility] H&P and Discharge Summary have been reviewed - Patient presents today about 15 days after recent hospitalization. Brief summary of recent course, patient had symptoms of delirium and confusion, she does not recall all of the details. Readmission on 06/10/23, she had Head imaging CT MRI. Found acute new Pulm Embolism identified on imaging, no lower DVT. She was switched to Eliquis from Lovenox for 3 months, thought provoked DVT due to orthopedic surgery.  Update interval with Post-op appt with Dr Martha Clan at [facility], 1 week ago on 06/20/23 all staples removed. She had X-rays completed, everything looked good with alignment. She is able to ambulate. She uses a cane. Homebound PT / regimen should be in place.  - Today reports overall has done well after discharge. Symptoms of Left leg aching has occurred, she has used ice packs. - She is taking Tylenol  She has a good support system and family checks on her daily. No further confusion or   - New medications on discharge: Eliquis 5 mg BID - Changes to current meds on discharge: none   I have reviewed the discharge medication list, and have reconciled the current and discharge medications today.        06/26/2023    2:20 PM 02/23/2023    4:30 PM 02/07/2023    5:08 PM  Depression  screen PHQ 2/9  Decreased Interest 0 1 1  Down, Depressed, Hopeless 0 0 1  PHQ - 2 Score 0 1 2  Altered sleeping 0 1 2  Tired, decreased energy 2 2 2   Change in appetite 0 2 2  Feeling bad or failure about yourself  0 0 1  Trouble concentrating 0 0 0  Moving slowly or fidgety/restless 0 0 0  Suicidal thoughts 0 0 0  PHQ-9 Score 2 6 9   Difficult doing work/chores  Somewhat difficult Not difficult at all    Social History   Tobacco Use   Smoking status: Former    Packs/day: 0.50    Years: 1.00    Additional pack years: 0.00    Total pack years: 0.50    Types: Cigarettes    Quit date: 07/26/1977    Years since quitting: 45.9    Passive exposure: Never   Smokeless tobacco: Never   Tobacco comments:    barley smoked when smoked   Vaping Use   Vaping Use: Never used  Substance Use Topics   Alcohol use: Yes    Alcohol/week: 0.0 standard drinks of alcohol    Comment: on occasion 1-2 x month   Drug use: No    Review of Systems Per HPI unless specifically indicated above     Objective:    BP (!) 142/80 (BP Location: Left Arm, Patient Position: Sitting, Cuff Size: Normal)   Pulse 79   Temp 99.4 F (37.4  C) (Oral)   Ht 5\' 2"  (1.575 m)   Wt 174 lb 9.6 oz (79.2 kg)   SpO2 97%   BMI 31.93 kg/m   Wt Readings from Last 3 Encounters:  06/26/23 174 lb 9.6 oz (79.2 kg)  06/11/23 178 lb 1.6 oz (80.8 kg)  06/05/23 178 lb 14.4 oz (81.1 kg)    Physical Exam Vitals and nursing note reviewed.  Constitutional:      General: She is not in acute distress.    Appearance: Normal appearance. She is well-developed. She is not diaphoretic.     Comments: Well-appearing, comfortable, cooperative  HENT:     Head: Normocephalic and atraumatic.  Eyes:     General:        Right eye: No discharge.        Left eye: No discharge.     Conjunctiva/sclera: Conjunctivae normal.  Cardiovascular:     Rate and Rhythm: Normal rate.  Pulmonary:     Effort: Pulmonary effort is normal.   Musculoskeletal:     Comments: L knee anterior incision with steri strips on, no staples. Swelling post op as expected  Skin:    General: Skin is warm and dry.     Findings: No erythema or rash.  Neurological:     Mental Status: She is alert and oriented to person, place, and time.  Psychiatric:        Mood and Affect: Mood normal.        Behavior: Behavior normal.        Thought Content: Thought content normal.     Comments: Well groomed, good eye contact, normal speech and thoughts    Results for orders placed or performed during the hospital encounter of 06/09/23  C Difficile Quick Screen w PCR reflex   Specimen: STOOL  Result Value Ref Range   C Diff antigen NEGATIVE NEGATIVE   C Diff toxin NEGATIVE NEGATIVE   C Diff interpretation No C. difficile detected.   CBC with Differential  Result Value Ref Range   WBC 10.7 (H) 4.0 - 10.5 K/uL   RBC 3.59 (L) 3.87 - 5.11 MIL/uL   Hemoglobin 9.8 (L) 12.0 - 15.0 g/dL   HCT 16.1 (L) 09.6 - 04.5 %   MCV 86.9 80.0 - 100.0 fL   MCH 27.3 26.0 - 34.0 pg   MCHC 31.4 30.0 - 36.0 g/dL   RDW 40.9 (H) 81.1 - 91.4 %   Platelets 358 150 - 400 K/uL   nRBC 0.0 0.0 - 0.2 %   Neutrophils Relative % 82 %   Neutro Abs 8.7 (H) 1.7 - 7.7 K/uL   Lymphocytes Relative 10 %   Lymphs Abs 1.1 0.7 - 4.0 K/uL   Monocytes Relative 5 %   Monocytes Absolute 0.6 0.1 - 1.0 K/uL   Eosinophils Relative 3 %   Eosinophils Absolute 0.3 0.0 - 0.5 K/uL   Basophils Relative 0 %   Basophils Absolute 0.0 0.0 - 0.1 K/uL   Immature Granulocytes 0 %   Abs Immature Granulocytes 0.03 0.00 - 0.07 K/uL  Comprehensive metabolic panel  Result Value Ref Range   Sodium 134 (L) 135 - 145 mmol/L   Potassium 3.0 (L) 3.5 - 5.1 mmol/L   Chloride 98 98 - 111 mmol/L   CO2 26 22 - 32 mmol/L   Glucose, Bld 136 (H) 70 - 99 mg/dL   BUN 11 8 - 23 mg/dL   Creatinine, Ser 7.82 0.44 - 1.00 mg/dL   Calcium 8.1 (  L) 8.9 - 10.3 mg/dL   Total Protein 7.1 6.5 - 8.1 g/dL   Albumin 3.6 3.5 -  5.0 g/dL   AST 19 15 - 41 U/L   ALT 15 0 - 44 U/L   Alkaline Phosphatase 71 38 - 126 U/L   Total Bilirubin 0.9 0.3 - 1.2 mg/dL   GFR, Estimated >16 >10 mL/min   Anion gap 9 5 - 15  Blood gas, venous  Result Value Ref Range   pH, Ven 7.42 7.25 - 7.43   pCO2, Ven 52 44 - 60 mmHg   Bicarbonate 33.7 (H) 20.0 - 28.0 mmol/L   Acid-Base Excess 7.6 (H) 0.0 - 2.0 mmol/L   Patient temperature 37.0    Collection site VENOUS   Urinalysis, Routine w reflex microscopic -Urine, Clean Catch  Result Value Ref Range   Color, Urine YELLOW (A) YELLOW   APPearance CLEAR (A) CLEAR   Specific Gravity, Urine 1.005 1.005 - 1.030   pH 7.0 5.0 - 8.0   Glucose, UA NEGATIVE NEGATIVE mg/dL   Hgb urine dipstick NEGATIVE NEGATIVE   Bilirubin Urine NEGATIVE NEGATIVE   Ketones, ur NEGATIVE NEGATIVE mg/dL   Protein, ur NEGATIVE NEGATIVE mg/dL   Nitrite NEGATIVE NEGATIVE   Leukocytes,Ua NEGATIVE NEGATIVE  D-dimer, quantitative  Result Value Ref Range   D-Dimer, Quant 2.08 (H) 0.00 - 0.50 ug/mL-FEU  Magnesium  Result Value Ref Range   Magnesium 2.1 1.7 - 2.4 mg/dL  CK  Result Value Ref Range   Total CK 91 38 - 234 U/L  Comprehensive metabolic panel  Result Value Ref Range   Sodium 139 135 - 145 mmol/L   Potassium 3.8 3.5 - 5.1 mmol/L   Chloride 103 98 - 111 mmol/L   CO2 28 22 - 32 mmol/L   Glucose, Bld 117 (H) 70 - 99 mg/dL   BUN 11 8 - 23 mg/dL   Creatinine, Ser 9.60 0.44 - 1.00 mg/dL   Calcium 8.4 (L) 8.9 - 10.3 mg/dL   Total Protein 6.4 (L) 6.5 - 8.1 g/dL   Albumin 3.1 (L) 3.5 - 5.0 g/dL   AST 19 15 - 41 U/L   ALT 13 0 - 44 U/L   Alkaline Phosphatase 63 38 - 126 U/L   Total Bilirubin 1.0 0.3 - 1.2 mg/dL   GFR, Estimated >45 >40 mL/min   Anion gap 8 5 - 15  CBC  Result Value Ref Range   WBC 8.9 4.0 - 10.5 K/uL   RBC 3.21 (L) 3.87 - 5.11 MIL/uL   Hemoglobin 8.9 (L) 12.0 - 15.0 g/dL   HCT 98.1 (L) 19.1 - 47.8 %   MCV 88.5 80.0 - 100.0 fL   MCH 27.7 26.0 - 34.0 pg   MCHC 31.3 30.0 - 36.0  g/dL   RDW 29.5 (H) 62.1 - 30.8 %   Platelets 319 150 - 400 K/uL   nRBC 0.0 0.0 - 0.2 %  Vitamin B12  Result Value Ref Range   Vitamin B-12 308 180 - 914 pg/mL  Folate  Result Value Ref Range   Folate 15.0 >5.9 ng/mL  T4, free  Result Value Ref Range   Free T4 1.14 (H) 0.61 - 1.12 ng/dL  TSH  Result Value Ref Range   TSH 3.274 0.350 - 4.500 uIU/mL  Ammonia  Result Value Ref Range   Ammonia <10 9 - 35 umol/L  Ethanol  Result Value Ref Range   Alcohol, Ethyl (B) <10 <10 mg/dL  Levetiracetam level  Result  Value Ref Range   Levetiracetam Lvl 14.5 10.0 - 40.0 ug/mL  HIV Antibody (routine testing w rflx)  Result Value Ref Range   HIV Screen 4th Generation wRfx Non Reactive Non Reactive  CBC  Result Value Ref Range   WBC 7.8 4.0 - 10.5 K/uL   RBC 3.54 (L) 3.87 - 5.11 MIL/uL   Hemoglobin 9.7 (L) 12.0 - 15.0 g/dL   HCT 16.1 (L) 09.6 - 04.5 %   MCV 85.3 80.0 - 100.0 fL   MCH 27.4 26.0 - 34.0 pg   MCHC 32.1 30.0 - 36.0 g/dL   RDW 40.9 (H) 81.1 - 91.4 %   Platelets 358 150 - 400 K/uL   nRBC 0.0 0.0 - 0.2 %  Basic metabolic panel  Result Value Ref Range   Sodium 140 135 - 145 mmol/L   Potassium 2.9 (L) 3.5 - 5.1 mmol/L   Chloride 104 98 - 111 mmol/L   CO2 26 22 - 32 mmol/L   Glucose, Bld 117 (H) 70 - 99 mg/dL   BUN 9 8 - 23 mg/dL   Creatinine, Ser 7.82 0.44 - 1.00 mg/dL   Calcium 8.4 (L) 8.9 - 10.3 mg/dL   GFR, Estimated >95 >62 mL/min   Anion gap 10 5 - 15  Glucose, capillary  Result Value Ref Range   Glucose-Capillary 112 (H) 70 - 99 mg/dL  Glucose, capillary  Result Value Ref Range   Glucose-Capillary 120 (H) 70 - 99 mg/dL  Magnesium  Result Value Ref Range   Magnesium 2.1 1.7 - 2.4 mg/dL  Glucose, capillary  Result Value Ref Range   Glucose-Capillary 123 (H) 70 - 99 mg/dL  Glucose, capillary  Result Value Ref Range   Glucose-Capillary 123 (H) 70 - 99 mg/dL  Glucose, capillary  Result Value Ref Range   Glucose-Capillary 144 (H) 70 - 99 mg/dL  CBG  monitoring, ED  Result Value Ref Range   Glucose-Capillary 126 (H) 70 - 99 mg/dL  Type and screen  Result Value Ref Range   ABO/RH(D) A POS    Antibody Screen NEG    Sample Expiration      06/13/2023,2359 Performed at [facility], [address] [place], [place] [address]   Troponin I (High Sensitivity)  Result Value Ref Range   Troponin I (High Sensitivity) 6 <18 ng/L      Assessment & Plan:   Problem List Items Addressed This Visit     Acute pulmonary embolism (HCC)    Acute provoked PE, secondary to post-op surgery No DVT No other complication  She is to complete 3 months of Eliquis Has 1 month supply, finishing soon  New order sent for Eliquis 5mg  TWICE A DAY for 2 additional months. START DATE 05/2023 > End DATE 08/2023  Follow-up as needed      Relevant Medications   ELIQUIS 5 MG TABS tablet   Other Relevant Orders   CBC with Differential/Platelet   RESOLVED: Postoperative delirium    Resolved. Secondary to post-op possibly anesthesia, questions surrounding when symptoms onset, she does not recall 5-6 days post-op even when she was in hospital and coherent. Question is does her recall not cover those days now that she actually did experience delirium and had to go back to hospital, but at the time she was in her right mind post-op. Discussion on this today. Difficult to determine timeline.  Regardless, acute delirium post-op has resolved entirely.      S/P TKR (total knee replacement) using cement,  left   Other Visit Diagnoses     Chronic pain of left knee    -  Primary   Hypokalemia       Relevant Orders   BASIC METABOLIC PANEL WITH GFR       HFU Reviewed records both surgery, post op and hospitalization Reviewed labs and imaging and testing.  Due for labs today CMET + CBC as requested.  L Knee TKR Improving as expected Already followed w/ ortho, staples out No changes to her rehab or treatment plan  Okay to keep taking Tylenol. And  good to use the topical since it is not oral and not absorbed as much.  START anti inflammatory topical - OTC Voltaren (generic Diclofenac) topical 2-4 times a day as needed for pain swelling of affected joint for 1-2 weeks or longer.    Meds ordered this encounter  Medications   ELIQUIS 5 MG TABS tablet    Sig: Take 1 tablet (5 mg total) by mouth 2 (two) times daily.    Dispense:  60 tablet    Refill:  1      Follow up plan: Return if symptoms worsen or fail to improve.   Saralyn Pilar, DO [facility] 06/26/2023, 2:12 PM

## 2023-06-27 DIAGNOSIS — Z96652 Presence of left artificial knee joint: Secondary | ICD-10-CM | POA: Diagnosis not present

## 2023-06-27 DIAGNOSIS — E1169 Type 2 diabetes mellitus with other specified complication: Secondary | ICD-10-CM | POA: Diagnosis not present

## 2023-06-27 DIAGNOSIS — R410 Disorientation, unspecified: Secondary | ICD-10-CM | POA: Diagnosis not present

## 2023-06-27 DIAGNOSIS — E785 Hyperlipidemia, unspecified: Secondary | ICD-10-CM | POA: Diagnosis not present

## 2023-06-27 DIAGNOSIS — I10 Essential (primary) hypertension: Secondary | ICD-10-CM | POA: Diagnosis not present

## 2023-06-27 DIAGNOSIS — I2699 Other pulmonary embolism without acute cor pulmonale: Secondary | ICD-10-CM | POA: Diagnosis not present

## 2023-06-27 DIAGNOSIS — Z471 Aftercare following joint replacement surgery: Secondary | ICD-10-CM | POA: Diagnosis not present

## 2023-06-27 DIAGNOSIS — D509 Iron deficiency anemia, unspecified: Secondary | ICD-10-CM | POA: Diagnosis not present

## 2023-06-27 DIAGNOSIS — G9341 Metabolic encephalopathy: Secondary | ICD-10-CM | POA: Diagnosis not present

## 2023-06-27 LAB — BASIC METABOLIC PANEL WITH GFR
BUN: 11 mg/dL (ref 7–25)
CO2: 30 mmol/L (ref 20–32)
Calcium: 9.4 mg/dL (ref 8.6–10.4)
Chloride: 103 mmol/L (ref 98–110)
Creat: 0.66 mg/dL (ref 0.60–1.00)
Glucose, Bld: 111 mg/dL (ref 65–139)
Potassium: 4 mmol/L (ref 3.5–5.3)
Sodium: 143 mmol/L (ref 135–146)
eGFR: 94 mL/min/{1.73_m2} (ref >=60)

## 2023-06-27 LAB — CBC WITH DIFFERENTIAL/PLATELET
Basophils Relative: 0.2 %
Eosinophils Relative: 4.9 %
Hemoglobin: 10.5 g/dL — ABNORMAL LOW (ref 11.7–15.5)
MCV: 88.9 fL (ref 80.0–100.0)
Neutrophils Relative %: 73.7 %
RBC: 3.69 10*6/uL — ABNORMAL LOW (ref 3.80–5.10)
RDW: 15.4 % — ABNORMAL HIGH (ref 11.0–15.0)

## 2023-07-03 ENCOUNTER — Other Ambulatory Visit: Payer: Self-pay | Admitting: Psychiatry

## 2023-07-04 DIAGNOSIS — E785 Hyperlipidemia, unspecified: Secondary | ICD-10-CM | POA: Diagnosis not present

## 2023-07-04 DIAGNOSIS — G9341 Metabolic encephalopathy: Secondary | ICD-10-CM | POA: Diagnosis not present

## 2023-07-04 DIAGNOSIS — E1169 Type 2 diabetes mellitus with other specified complication: Secondary | ICD-10-CM | POA: Diagnosis not present

## 2023-07-04 DIAGNOSIS — I2699 Other pulmonary embolism without acute cor pulmonale: Secondary | ICD-10-CM | POA: Diagnosis not present

## 2023-07-04 DIAGNOSIS — Z471 Aftercare following joint replacement surgery: Secondary | ICD-10-CM | POA: Diagnosis not present

## 2023-07-04 DIAGNOSIS — R410 Disorientation, unspecified: Secondary | ICD-10-CM | POA: Diagnosis not present

## 2023-07-04 DIAGNOSIS — Z96652 Presence of left artificial knee joint: Secondary | ICD-10-CM | POA: Diagnosis not present

## 2023-07-04 DIAGNOSIS — D509 Iron deficiency anemia, unspecified: Secondary | ICD-10-CM | POA: Diagnosis not present

## 2023-07-04 DIAGNOSIS — I10 Essential (primary) hypertension: Secondary | ICD-10-CM | POA: Diagnosis not present

## 2023-07-06 ENCOUNTER — Ambulatory Visit: Payer: Medicare PPO | Admitting: Internal Medicine

## 2023-07-06 ENCOUNTER — Ambulatory Visit: Payer: Self-pay | Admitting: *Deleted

## 2023-07-06 ENCOUNTER — Encounter: Payer: Self-pay | Admitting: Family Medicine

## 2023-07-06 ENCOUNTER — Encounter: Payer: Self-pay | Admitting: Internal Medicine

## 2023-07-06 VITALS — BP 183/84 | HR 86 | Ht 62.0 in | Wt 175.0 lb

## 2023-07-06 DIAGNOSIS — F419 Anxiety disorder, unspecified: Secondary | ICD-10-CM

## 2023-07-06 DIAGNOSIS — E6609 Other obesity due to excess calories: Secondary | ICD-10-CM | POA: Diagnosis not present

## 2023-07-06 DIAGNOSIS — Z6832 Body mass index (BMI) 32.0-32.9, adult: Secondary | ICD-10-CM

## 2023-07-06 DIAGNOSIS — I10 Essential (primary) hypertension: Secondary | ICD-10-CM

## 2023-07-06 MED ORDER — AMLODIPINE BESYLATE 10 MG PO TABS: 10.0000 mg | ORAL_TABLET | Freq: Every day | ORAL | 0 refills | Status: DC

## 2023-07-06 NOTE — Patient Instructions (Signed)
Hypertension, Adult Hypertension is another name for high blood pressure. High blood pressure forces your heart to work harder to pump blood. This can cause problems over time. There are two numbers in a blood pressure reading. There is a top number (systolic) over a bottom number (diastolic). It is best to have a blood pressure that is below 120/80. What are the causes? The cause of this condition is not known. Some other conditions can lead to high blood pressure. What increases the risk? Some lifestyle factors can make you more likely to develop high blood pressure: Smoking. Not getting enough exercise or physical activity. Being overweight. Having too much fat, sugar, calories, or salt (sodium) in your diet. Drinking too much alcohol. Other risk factors include: Having any of these conditions: Heart disease. Diabetes. High cholesterol. Kidney disease. Obstructive sleep apnea. Having a family history of high blood pressure and high cholesterol. Age. The risk increases with age. Stress. What are the signs or symptoms? High blood pressure may not cause symptoms. Very high blood pressure (hypertensive crisis) may cause: Headache. Fast or uneven heartbeats (palpitations). Shortness of breath. Nosebleed. Vomiting or feeling like you may vomit (nauseous). Changes in how you see. Very bad chest pain. Feeling dizzy. Seizures. How is this treated? This condition is treated by making healthy lifestyle changes, such as: Eating healthy foods. Exercising more. Drinking less alcohol. Your doctor may prescribe medicine if lifestyle changes do not help enough and if: Your top number is above 130. Your bottom number is above 80. Your personal target blood pressure may vary. Follow these instructions at home: Eating and drinking  If told, follow the DASH eating plan. To follow this plan: Fill one half of your plate at each meal with fruits and vegetables. Fill one fourth of your plate  at each meal with whole grains. Whole grains include whole-wheat pasta, brown rice, and whole-grain bread. Eat or drink low-fat dairy products, such as skim milk or low-fat yogurt. Fill one fourth of your plate at each meal with low-fat (lean) proteins. Low-fat proteins include fish, chicken without skin, eggs, beans, and tofu. Avoid fatty meat, cured and processed meat, or chicken with skin. Avoid pre-made or processed food. Limit the amount of salt in your diet to less than 1,500 mg each day. Do not drink alcohol if: Your doctor tells you not to drink. You are pregnant, may be pregnant, or are planning to become pregnant. If you drink alcohol: Limit how much you have to: 0-1 drink a day for women. 0-2 drinks a day for men. Know how much alcohol is in your drink. In the [place] one drink equals one 12 oz bottle of beer (355 mL), one 5 oz glass of wine (148 mL), or one 1 oz glass of hard liquor (44 mL). Lifestyle  Work with your doctor to stay at a healthy weight or to lose weight. Ask your doctor what the best weight is for you. Get at least 30 minutes of exercise that causes your heart to beat faster (aerobic exercise) most days of the week. This may include walking, swimming, or biking. Get at least 30 minutes of exercise that strengthens your muscles (resistance exercise) at least 3 days a week. This may include lifting weights or doing Pilates. Do not smoke or use any products that contain nicotine or tobacco. If you need help quitting, ask your doctor. Check your blood pressure at home as told by your doctor. Keep all follow-up visits. Medicines Take over-the-counter and prescription medicines   only as told by your doctor. Follow directions carefully. Do not skip doses of blood pressure medicine. The medicine does not work as well if you skip doses. Skipping doses also puts you at risk for problems. Ask your doctor about side effects or reactions to medicines that you should watch  for. Contact a doctor if: You think you are having a reaction to the medicine you are taking. You have headaches that keep coming back. You feel dizzy. You have swelling in your ankles. You have trouble with your vision. Get help right away if: You get a very bad headache. You start to feel mixed up (confused). You feel weak or numb. You feel faint. You have very bad pain in your: Chest. Belly (abdomen). You vomit more than once. You have trouble breathing. These symptoms may be an emergency. Get help right away. Call 911. Do not wait to see if the symptoms will go away. Do not drive yourself to the hospital. Summary Hypertension is another name for high blood pressure. High blood pressure forces your heart to work harder to pump blood. For most people, a normal blood pressure is less than 120/80. Making healthy choices can help lower blood pressure. If your blood pressure does not get lower with healthy choices, you may need to take medicine. This information is not intended to replace advice given to you by your health care provider. Make sure you discuss any questions you have with your health care provider. Document Revised: 09/22/2021 Document Reviewed: 09/22/2021 Elsevier Patient Education  2024 Elsevier Inc.  

## 2023-07-06 NOTE — Telephone Encounter (Signed)
  Chief Complaint: elevated BP Symptoms: no symptoms- elevated BP with PT- 220/140 at last session- and 214/105 today at home Frequency: ongoing fluctuating BP Pertinent Negatives: Patient denies blurred vision, chest pain, difficulty breathing, headache, weakness Disposition: [] ED /[] Urgent Care (no appt availability in office) / [x] Appointment(In office/virtual)/ []  [facility]/ [] Home Care/ [] Refused Recommended Disposition /[] [facility]/ []  Follow-up with PCP Additional Notes: Appointment in office has been scheduled for evaluation    Reason for Disposition  [1] Systolic BP  >= 200 OR Diastolic >= 120 AND [2] having NO cardiac or neurologic symptoms  Answer Assessment - Initial Assessment Questions 1. BLOOD PRESSURE: "What is the blood pressure?" "Did you take at least two measurements 5 minutes apart?"     High in office last week- doing PT so she gets it checked at home.     214/105 P85 2. ONSET: "When did you take your blood pressure?"     Ongoing problem- arm 3. HOW: "How did you take your blood pressure?" (e.g., automatic home BP monitor, visiting nurse)     Automatic cuff 4. HISTORY: "Do you have a history of high blood pressure?"     Yes- changed medication last year- in hospital 1 month ago 5. MEDICINES: "Are you taking any medicines for blood pressure?" "Have you missed any doses recently?"     Yes, no missed doses 6. OTHER SYMPTOMS: "Do you have any symptoms?" (e.g., blurred vision, chest pain, difficulty breathing, headache, weakness)     no  Protocols used: Blood Pressure - High-A-AH

## 2023-07-06 NOTE — Assessment & Plan Note (Signed)
Uncontrolled on valsartan and carvedilol RX for amlodipine 10 mg daily Reinforced DASH diet and exercise for weight loss

## 2023-07-06 NOTE — Assessment & Plan Note (Signed)
Encouraged diet and exercise for weight loss ?

## 2023-07-06 NOTE — Progress Notes (Signed)
Subjective:    Patient ID: Amanda Wang, female    DOB: 1951/08/08, 72 y.o.   MRN: [id number]  HPI  Pt presents to the clinic today with c/o elevated blood pressures. She has a history of HTN managed on valsartan and carvedilol. Her blood pressures have been elevated since her knee surgery 1 month. At first, it was thought that this was pain related but her pain has improved but her blood pressure remains elevated. She has been feeling very anxious over the last month. She attributes this to the fact that she was diagnosed with delirum in the post op setting, ended up having a PE. She has a history of anxiety and depression, managed on viibryd and lorazepam. Her BP today is 183/84.  Review of Systems     Past Medical History:  Diagnosis Date   Advanced care planning/counseling discussion 09/16/2018   Anemia    Arthritis    Chiari malformation type I (HCC)    Depression    Diabetes mellitus, type 2 (HCC)    GERD (gastroesophageal reflux disease)    History of hiatal hernia    Hyperlipidemia    Hypertension    Iron deficiency anemia    Irritable bowel syndrome with diarrhea    Osteoarthritis of left knee    PONV (postoperative nausea and vomiting)    Restless legs    Seasonal allergies    Sleep apnea    no Cpap use   Small intestinal bacterial overgrowth    Spinal stenosis    Vaccine reaction, initial encounter 03/11/2020    Current Outpatient Medications  Medication Sig Dispense Refill   ACCU-CHEK GUIDE test strip Use to check blood sugar 1 x daily as instructed 100 strip 3   Accu-Chek Softclix Lancets lancets TEST TWO TO THREE TIMES DAILY DX E11.69 300 each 2   acetaminophen (TYLENOL) 325 MG tablet Take 2 tablets (650 mg total) by mouth every 6 (six) hours as needed for mild pain, moderate pain, fever or headache (or Fever >/= 101).     albuterol (VENTOLIN HFA) 108 (90 Base) MCG/ACT inhaler INHALE 2 INHALATIONS INTO THE LUNGS EVERY 4 HOURS AS NEEDED     atorvastatin  (LIPITOR) 40 MG tablet Take 1 tablet (40 mg total) by mouth at bedtime. 90 tablet 3   azelastine (ASTELIN) 0.1 % nasal spray Place 1 spray into both nostrils 2 (two) times daily.     Blood Glucose Monitoring Suppl (ACCU-CHEK GUIDE) w/Device KIT 1 Device by Does not apply route 4 (four) times daily. DX E11.69 1 kit 0   Carboxymethylcellul-Glycerin (LUBRICATING EYE DROPS OP) Place 1 drop into both eyes daily as needed (dry eyes).     carvedilol (COREG) 6.25 MG tablet Take 1 tablet (6.25 mg total) by mouth 2 (two) times daily with a meal. 180 tablet 3   Cholecalciferol 125 MCG (5000 UT) TABS Take 1 tablet by mouth daily.     ELIQUIS 5 MG TABS tablet Take 1 tablet (5 mg total) by mouth 2 (two) times daily. 60 tablet 1   levETIRAcetam (KEPPRA) 500 MG tablet Take 1 tablet (500 mg total) by mouth at bedtime.     Lidocaine-Menthol (ICY HOT MAX LIDOCAINE EX) Apply 1 application. topically daily as needed (pain).     LORazepam (ATIVAN) 1 MG tablet Take 0.5-1 tablets (0.5-1 mg total) by mouth daily as needed for anxiety. 30 tablet 2   omeprazole (PRILOSEC) 40 MG capsule Take 1 capsule (40 mg total) by mouth daily  before breakfast. (Patient taking differently: Take 40 mg by mouth at bedtime.) 90 capsule 3   Spacer/Aero-Holding Chambers (AEROCHAMBER PLUS WITH MASK) inhaler Use with inhaler as needed. 1 each 0   valsartan (DIOVAN) 320 MG tablet Take 1 tablet (320 mg total) by mouth daily. 90 tablet 3   Vilazodone HCl (VIIBRYD) 40 MG TABS Take 1 tablet (40 mg total) by mouth daily. 90 tablet 0   amLODipine (NORVASC) 5 MG tablet Take 1 tablet (5 mg total) by mouth daily. (Patient not taking: Reported on 07/06/2023) 30 tablet 0   potassium chloride SA (KLOR-CON M) 20 MEQ tablet Take 1 tablet (20 mEq total) by mouth daily for 3 days. 3 tablet 0   No current facility-administered medications for this visit.    Allergies  Allergen Reactions   Fexofenadine Anxiety    Makes her have a panic attack   Codeine Nausea  Only    Dizziness   Methylphenidate Hcl     "Caused a seizure"   Zoloft [Sertraline] Other (See Comments)    Sleepiness   Magnevist [Gadopentetate] Hives and Itching    IV Contrast   Sulfa Antibiotics Rash    Family History  Problem Relation Age of Onset   Hypertension Mother    Cancer Mother    Stroke Mother    Heart disease Father    Breast cancer Sister 75   Breast cancer Paternal Aunt    Diabetes Paternal Grandmother     Social History   Socioeconomic History   Marital status: Divorced    Spouse name: Not on file   Number of children: 2   Years of education: Not on file   Highest education level: Bachelor's degree (e.g., BA, AB, BS)  Occupational History   Occupation: part time   Tobacco Use   Smoking status: Former    Current packs/day: 0.00    Average packs/day: 0.5 packs/day for 1 year (0.5 ttl pk-yrs)    Types: Cigarettes    Start date: 07/26/1976    Quit date: 07/26/1977    Years since quitting: 45.9    Passive exposure: Never   Smokeless tobacco: Never   Tobacco comments:    barley smoked when smoked   Vaping Use   Vaping status: Never Used  Substance and Sexual Activity   Alcohol use: Yes    Alcohol/week: 0.0 standard drinks of alcohol    Comment: on occasion 1-2 x month   Drug use: No   Sexual activity: Yes  Other Topics Concern   Not on file  Social History Narrative   Lives alone.   Social Determinants of Health   Financial Resource Strain: Low Risk  (06/15/2023)   Overall Financial Resource Strain (CARDIA)    Difficulty of Paying Living Expenses: Not hard at all  Food Insecurity: No Food Insecurity (06/15/2023)   Hunger Vital Sign    Worried About Running Out of Food in the Last Year: Never true    Ran Out of Food in the Last Year: Never true  Transportation Needs: No Transportation Needs (06/15/2023)   PRAPARE - Administrator, Civil Service (Medical): No    Lack of Transportation (Non-Medical): No  Physical Activity: Inactive  (10/17/2021)   Exercise Vital Sign    Days of Exercise per Week: 0 days    Minutes of Exercise per Session: 0 min  Stress: Stress Concern Present (10/17/2021)   [employer] of Occupational Health - Occupational Stress Questionnaire    Feeling of Stress :  To some extent  Social Connections: Moderately Integrated (09/22/2019)   Social Connection and Isolation Panel [NHANES]    Frequency of Communication with Friends and Family: More than three times a week    Frequency of Social Gatherings with Friends and Family: More than three times a week    Attends Religious Services: More than 4 times per year    Active Member of [employer] or Organizations: Yes    Attends Engineer, structural: More than 4 times per year    Marital Status: Divorced  Intimate Partner Violence: Not At Risk (06/10/2023)   Humiliation, Afraid, Rape, and Kick questionnaire    Fear of Current or Ex-Partner: No    Emotionally Abused: No    Physically Abused: No    Sexually Abused: No     Constitutional: Denies fever, malaise, fatigue, headache or abrupt weight changes.  Respiratory: Denies difficulty breathing, shortness of breath, cough or sputum production.   Cardiovascular: Denies chest pain, chest tightness, palpitations or swelling in the hands or feet.  Musculoskeletal: Denies decrease in range of motion, difficulty with gait, muscle pain or joint pain and swelling.  Skin: Denies redness, rashes, lesions or ulcercations.  Neurological: Denies dizziness, difficulty with memory, difficulty with speech or problems with balance and coordination.  Psych: Pt has a history of anxiety and depression. Denies  SI/HI.  No other specific complaints in a complete review of systems (except as listed in HPI above).  Objective:   Physical Exam  BP (!) 183/84   Pulse 86   Ht 5\' 2"  (1.575 m)   Wt 175 lb (79.4 kg)   SpO2 99%   BMI 32.01 kg/m  Wt Readings from Last 3 Encounters:  07/06/23 175 lb (79.4 kg)   06/26/23 174 lb 9.6 oz (79.2 kg)  06/11/23 178 lb 1.6 oz (80.8 kg)    General: Appears her stated age, obese, in NAD. Cardiovascular: Normal rate and rhythm. S1,S2 noted.  No murmur, rubs or gallops noted.  Pulmonary/Chest: Normal effort and positive vesicular breath sounds. No respiratory distress. No wheezes, rales or ronchi noted.  Musculoskeletal: No difficulty with gait.  Neurological: Alert and oriented. Coordination normal.  Psychiatric: Mood and affect normal. Mildly anxious appearing today. Judgment and thought content normal.    BMET    Component Value Date/Time   NA 143 06/26/2023 1438   NA 143 04/25/2022 1625   NA 142 07/24/2012 0928   K 4.0 06/26/2023 1438   K 4.2 07/24/2012 0928   CL 103 06/26/2023 1438   CL 104 07/24/2012 0928   CO2 30 06/26/2023 1438   CO2 33 (H) 07/24/2012 0928   GLUCOSE 111 06/26/2023 1438   GLUCOSE 117 (H) 07/24/2012 0928   BUN 11 06/26/2023 1438   BUN 22 04/25/2022 1625   BUN 17 07/24/2012 0928   CREATININE 0.66 06/26/2023 1438   CALCIUM 9.4 06/26/2023 1438   CALCIUM 8.5 07/24/2012 0928   GFRNONAA >60 06/11/2023 0535   GFRNONAA >60 07/24/2012 0928   GFRAA 96 01/11/2021 1132   GFRAA >60 07/24/2012 0928    Lipid Panel     Component Value Date/Time   CHOL 134 02/20/2023 0939   CHOL 158 05/31/2021 1138   CHOL 175 03/07/2018 1419   TRIG 119 02/20/2023 0939   TRIG 164 (H) 03/07/2018 1419   HDL 48 (L) 02/20/2023 0939   HDL 62 05/31/2021 1138   CHOLHDL 2.8 02/20/2023 0939   VLDL 33 (H) 03/07/2018 1419   LDLCALC 65 02/20/2023 9629  CBC    Component Value Date/Time   WBC 8.4 06/26/2023 1438   RBC 3.69 (L) 06/26/2023 1438   HGB 10.5 (L) 06/26/2023 1438   HGB 12.2 12/30/2021 1146   HCT 32.8 (L) 06/26/2023 1438   HCT 38.0 12/30/2021 1146   PLT 481 (H) 06/26/2023 1438   PLT 431 12/30/2021 1146   MCV 88.9 06/26/2023 1438   MCV 79 12/30/2021 1146   MCV 86 07/24/2012 0928   MCH 28.5 06/26/2023 1438   MCHC 32.0 06/26/2023 1438    RDW 15.4 (H) 06/26/2023 1438   RDW 16.8 (H) 12/30/2021 1146   RDW 14.6 (H) 07/24/2012 0928   LYMPHSABS 1,117 06/26/2023 1438   LYMPHSABS 1.3 12/30/2021 1146   LYMPHSABS 1.2 07/24/2012 0928   MONOABS 0.6 06/09/2023 2100   MONOABS 0.4 07/24/2012 0928   EOSABS 412 06/26/2023 1438   EOSABS 0.1 12/30/2021 1146   EOSABS 0.2 07/24/2012 0928   BASOSABS 17 06/26/2023 1438   BASOSABS 0.0 12/30/2021 1146   BASOSABS 0.0 07/24/2012 0928    Hgb A1C Lab Results  Component Value Date   HGBA1C 6.2 (H) 05/25/2023            Assessment & Plan:     RTC in 2 weeks, follow up HTN Nicki Reaper, NP

## 2023-07-06 NOTE — Assessment & Plan Note (Signed)
Could be contributing but BP significantly elevated and we need to get these numbers down so will add amlodipine 10 mg daily Continue viibryd and lorazepam as needed

## 2023-07-11 DIAGNOSIS — D509 Iron deficiency anemia, unspecified: Secondary | ICD-10-CM | POA: Diagnosis not present

## 2023-07-11 DIAGNOSIS — G9341 Metabolic encephalopathy: Secondary | ICD-10-CM | POA: Diagnosis not present

## 2023-07-11 DIAGNOSIS — Z471 Aftercare following joint replacement surgery: Secondary | ICD-10-CM | POA: Diagnosis not present

## 2023-07-11 DIAGNOSIS — I10 Essential (primary) hypertension: Secondary | ICD-10-CM | POA: Diagnosis not present

## 2023-07-11 DIAGNOSIS — E1169 Type 2 diabetes mellitus with other specified complication: Secondary | ICD-10-CM | POA: Diagnosis not present

## 2023-07-11 DIAGNOSIS — Z96652 Presence of left artificial knee joint: Secondary | ICD-10-CM | POA: Diagnosis not present

## 2023-07-11 DIAGNOSIS — I2699 Other pulmonary embolism without acute cor pulmonale: Secondary | ICD-10-CM | POA: Diagnosis not present

## 2023-07-11 DIAGNOSIS — E785 Hyperlipidemia, unspecified: Secondary | ICD-10-CM | POA: Diagnosis not present

## 2023-07-11 DIAGNOSIS — R410 Disorientation, unspecified: Secondary | ICD-10-CM | POA: Diagnosis not present

## 2023-07-12 DIAGNOSIS — Z96652 Presence of left artificial knee joint: Secondary | ICD-10-CM | POA: Diagnosis not present

## 2023-07-16 DIAGNOSIS — Z96652 Presence of left artificial knee joint: Secondary | ICD-10-CM | POA: Diagnosis not present

## 2023-07-19 ENCOUNTER — Ambulatory Visit: Payer: Medicare PPO | Admitting: Family Medicine

## 2023-07-19 ENCOUNTER — Encounter: Payer: Self-pay | Admitting: Family Medicine

## 2023-07-19 VITALS — BP 132/70 | HR 90 | Temp 97.3°F | Wt 173.0 lb

## 2023-07-19 DIAGNOSIS — F419 Anxiety disorder, unspecified: Secondary | ICD-10-CM | POA: Diagnosis not present

## 2023-07-19 DIAGNOSIS — Z96652 Presence of left artificial knee joint: Secondary | ICD-10-CM | POA: Diagnosis not present

## 2023-07-19 DIAGNOSIS — I10 Essential (primary) hypertension: Secondary | ICD-10-CM | POA: Diagnosis not present

## 2023-07-19 MED ORDER — LORAZEPAM 1 MG PO TABS
0.5000 mg | ORAL_TABLET | Freq: Every day | ORAL | 2 refills | Status: DC | PRN
Start: 2023-07-19 — End: 2024-03-24

## 2023-07-19 MED ORDER — AMLODIPINE BESYLATE 10 MG PO TABS: 10.0000 mg | ORAL_TABLET | Freq: Every day | ORAL | 3 refills | Status: DC

## 2023-07-19 NOTE — Patient Instructions (Addendum)
Thank you for coming to the office today.  BP is improved. Let's keep the Amlodipine 10mg  daily, I have sent new rx 90 day and refills Continue Valsartan 320mg   Added Lorazepam.  I put a note for you to not receive insulin in the hospital.  Please schedule a Follow-up Appointment to: Return in about 7 months (around 02/16/2024) for 6-7 month Annual Physical AM apt fasting lab AFTER.  If you have any other questions or concerns, please feel free to call the office or send a message through MyChart. You may also schedule an earlier appointment if necessary.  Additionally, you may be receiving a survey about your experience at our office within a few days to 1 week by e-mail or mail. We value your feedback.  Saralyn Pilar, DO [facility], [place]

## 2023-07-19 NOTE — Progress Notes (Signed)
Subjective:    Patient ID: Amanda Wang, female    DOB: 10-Dec-1951, 72 y.o.   MRN: [id number]  Amanda Wang is a 72 y.o. female presenting on 07/19/2023 for No chief complaint on file.   HPI   CHRONIC HTN: Recent history History of elevated BP acutely recently, BP >200/100, then saw Amanda Wang 07/06/23 Restarted Amlodipine 10mg  daily  Previously OFF Amlodipine d/t leg swelling. And OFF hydrochlorothiazide due to muscle cramps She had been on Valsartan 320mg  daily after dose increase previously  Today she is doing well with BP, improved back on med. No new leg swelling from amlodipine  She is 6 weeks out of knee surgery She has some swelling due to knee surgery 3-6 months  On Anticoagulation still. Until September. Asking about NSAID Working with PT      06/26/2023    2:20 PM 02/23/2023    4:30 PM 02/07/2023    5:08 PM  Depression screen PHQ 2/9  Decreased Interest 0 1 1  Down, Depressed, Hopeless 0 0 1  PHQ - 2 Score 0 1 2  Altered sleeping 0 1 2  Tired, decreased energy 2 2 2   Change in appetite 0 2 2  Feeling bad or failure about yourself  0 0 1  Trouble concentrating 0 0 0  Moving slowly or fidgety/restless 0 0 0  Suicidal thoughts 0 0 0  PHQ-9 Score 2 6 9   Difficult doing work/chores  Somewhat difficult Not difficult at all    Social History   Tobacco Use   Smoking status: Former    Current packs/day: 0.00    Average packs/day: 0.5 packs/day for 1 year (0.5 ttl pk-yrs)    Types: Cigarettes    Start date: 07/26/1976    Quit date: 07/26/1977    Years since quitting: 46.0    Passive exposure: Never   Smokeless tobacco: Never   Tobacco comments:    barley smoked when smoked   Vaping Use   Vaping status: Never Used  Substance Use Topics   Alcohol use: Yes    Alcohol/week: 0.0 standard drinks of alcohol    Comment: on occasion 1-2 x month   Drug use: No    Review of Systems Per HPI unless specifically indicated above     Objective:    BP 132/70 (BP  Location: Left Arm, Patient Position: Sitting, Cuff Size: Large)   Pulse 90   Temp (!) 97.3 F (36.3 C) (Temporal)   Wt 173 lb (78.5 kg)   SpO2 99%   BMI 31.64 kg/m   Wt Readings from Last 3 Encounters:  07/19/23 173 lb (78.5 kg)  07/06/23 175 lb (79.4 kg)  06/26/23 174 lb 9.6 oz (79.2 kg)    Physical Exam   Results for orders placed or performed in visit on 06/26/23  BASIC METABOLIC PANEL WITH GFR  Result Value Ref Range   Glucose, Bld 111 65 - 139 mg/dL   BUN 11 7 - 25 mg/dL   Creat 1.02 7.25 - 3.66 mg/dL   eGFR 94 > OR = 60 YQ/IHK/7.42V9   BUN/Creatinine Ratio SEE NOTE: 6 - 22 (calc)   Sodium 143 135 - 146 mmol/L   Potassium 4.0 3.5 - 5.3 mmol/L   Chloride 103 98 - 110 mmol/L   CO2 30 20 - 32 mmol/L   Calcium 9.4 8.6 - 10.4 mg/dL  CBC with Differential/Platelet  Result Value Ref Range   WBC 8.4 3.8 - 10.8 Thousand/uL   RBC 3.69 (  L) 3.80 - 5.10 Million/uL   Hemoglobin 10.5 (L) 11.7 - 15.5 g/dL   HCT 30.8 (L) 65.7 - 84.6 %   MCV 88.9 80.0 - 100.0 fL   MCH 28.5 27.0 - 33.0 pg   MCHC 32.0 32.0 - 36.0 g/dL   RDW 96.2 (H) 95.2 - 84.1 %   Platelets 481 (H) 140 - 400 Thousand/uL   MPV 9.0 7.5 - 12.5 fL   Neutro Abs 6,191 1,500 - 7,800 cells/uL   Lymphs Abs 1,117 850 - 3,900 cells/uL   Absolute Monocytes 664 200 - 950 cells/uL   Eosinophils Absolute 412 15 - 500 cells/uL   Basophils Absolute 17 0 - 200 cells/uL   Neutrophils Relative % 73.7 %   Total Lymphocyte 13.3 %   Monocytes Relative 7.9 %   Eosinophils Relative 4.9 %   Basophils Relative 0.2 %      Assessment & Plan:   Problem List Items Addressed This Visit     Anxiety   Relevant Medications   LORazepam (ATIVAN) 1 MG tablet   Essential hypertension - Primary   Relevant Medications   amLODipine (NORVASC) 10 MG tablet    HYPERTENSION Uncertain exact etiology of spike in BP lately may be with hospital / procedure etc and other acute issues. Now back on Amlodipine BP is improved. Continue Valsartan  320mg , Amlodipine 10mg  daily  Anxiety Refill Lorazepam  We discussed her preference to avoid insulin in hospital. I explained the rationale behind hospital management w/ insulin. I put a note for you to not receive insulin in the hospital.  Meds ordered this encounter  Medications   amLODipine (NORVASC) 10 MG tablet    Sig: Take 1 tablet (10 mg total) by mouth daily.    Dispense:  90 tablet    Refill:  3   LORazepam (ATIVAN) 1 MG tablet    Sig: Take 0.5-1 tablets (0.5-1 mg total) by mouth daily as needed for anxiety.    Dispense:  30 tablet    Refill:  2      Follow up plan: Return in about 7 months (around 02/16/2024) for 6-7 month Annual Physical AM apt fasting lab AFTER.   Saralyn Pilar, DO [facility] 07/19/2023, 12:00 PM

## 2023-07-23 ENCOUNTER — Other Ambulatory Visit: Payer: Medicare PPO

## 2023-07-23 ENCOUNTER — Ambulatory Visit: Payer: Medicare PPO | Admitting: Internal Medicine

## 2023-07-23 ENCOUNTER — Ambulatory Visit: Payer: Medicare PPO

## 2023-07-24 ENCOUNTER — Telehealth: Payer: Self-pay | Admitting: Psychiatry

## 2023-07-24 ENCOUNTER — Other Ambulatory Visit: Payer: Self-pay | Admitting: Psychiatry

## 2023-07-24 DIAGNOSIS — Z96652 Presence of left artificial knee joint: Secondary | ICD-10-CM | POA: Diagnosis not present

## 2023-07-24 MED ORDER — BUSPIRONE HCL 5 MG PO TABS: 5.0000 mg | ORAL_TABLET | Freq: Two times a day (BID) | ORAL | 0 refills | Status: DC

## 2023-07-24 NOTE — Telephone Encounter (Signed)
Pt notified that rx was sent to the pharmacy. 

## 2023-07-24 NOTE — Telephone Encounter (Signed)
Patient called stating she has no refills on buspirone 5 mg. Last seen in June 2024 and no refills called in at that time. Please send to [facility] in [place].

## 2023-07-24 NOTE — Telephone Encounter (Signed)
Ordered

## 2023-07-25 ENCOUNTER — Encounter: Payer: Self-pay | Admitting: Family Medicine

## 2023-07-25 DIAGNOSIS — Z96652 Presence of left artificial knee joint: Secondary | ICD-10-CM | POA: Diagnosis not present

## 2023-07-31 DIAGNOSIS — Z96652 Presence of left artificial knee joint: Secondary | ICD-10-CM | POA: Diagnosis not present

## 2023-08-02 DIAGNOSIS — Z96652 Presence of left artificial knee joint: Secondary | ICD-10-CM | POA: Diagnosis not present

## 2023-08-06 DIAGNOSIS — Z96652 Presence of left artificial knee joint: Secondary | ICD-10-CM | POA: Diagnosis not present

## 2023-08-08 ENCOUNTER — Ambulatory Visit: Payer: Medicare PPO | Admitting: Family Medicine

## 2023-08-09 DIAGNOSIS — Z96652 Presence of left artificial knee joint: Secondary | ICD-10-CM | POA: Diagnosis not present

## 2023-08-15 DIAGNOSIS — Z96652 Presence of left artificial knee joint: Secondary | ICD-10-CM | POA: Diagnosis not present
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
## 2023-08-21 ENCOUNTER — Encounter: Payer: Self-pay | Admitting: Family Medicine

## 2023-08-21 ENCOUNTER — Ambulatory Visit: Payer: Medicare PPO | Admitting: Family Medicine

## 2023-08-21 VITALS — BP 130/72 | HR 82 | Ht 62.0 in | Wt 174.0 lb

## 2023-08-21 DIAGNOSIS — M25572 Pain in left ankle and joints of left foot: Secondary | ICD-10-CM | POA: Diagnosis not present

## 2023-08-21 DIAGNOSIS — M7989 Other specified soft tissue disorders: Secondary | ICD-10-CM | POA: Diagnosis not present

## 2023-08-21 DIAGNOSIS — I10 Essential (primary) hypertension: Secondary | ICD-10-CM

## 2023-08-21 DIAGNOSIS — R2242 Localized swelling, mass and lump, left lower limb: Secondary | ICD-10-CM | POA: Diagnosis not present

## 2023-08-21 DIAGNOSIS — M79672 Pain in left foot: Secondary | ICD-10-CM | POA: Diagnosis not present

## 2023-08-21 NOTE — Progress Notes (Signed)
Subjective:    Patient ID: Amanda Wang, female    DOB: 12-Feb-1951, 72 y.o.   MRN: [id number]  Amanda Wang is a 72 y.o. female presenting on 08/21/2023 for Medical Management of Chronic Issues   HPI  CHRONIC HTN:  Restarted Amlodipine 10mg  daily previous visit after hospital, but unfortunately still had side effects with leg swelling and has come OFF medicine since last Friday 08/17/23 Previously OFF Amlodipine d/t leg swelling. And OFF hydrochlorothiazide due to muscle cramps  Meds - currently on Valsartan 320mg  daily, Carvedilol 6.25mg  TWICE A DAY   Today she is doing well with BP, improved back on med. No new leg swelling from amlodipine   She is 6 weeks out of knee surgery She has some swelling due to knee surgery 3-6 months   On Anticoagulation still. Until September. Asking about NSAID Working with PT       06/26/2023    2:20 PM 02/23/2023    4:30 PM 02/07/2023    5:08 PM  Depression screen PHQ 2/9  Decreased Interest 0 1 1  Down, Depressed, Hopeless 0 0 1  PHQ - 2 Score 0 1 2  Altered sleeping 0 1 2  Tired, decreased energy 2 2 2   Change in appetite 0 2 2  Feeling bad or failure about yourself  0 0 1  Trouble concentrating 0 0 0  Moving slowly or fidgety/restless 0 0 0  Suicidal thoughts 0 0 0  PHQ-9 Score 2 6 9   Difficult doing work/chores  Somewhat difficult Not difficult at all    Social History   Tobacco Use   Smoking status: Former    Current packs/day: 0.00    Average packs/day: 0.5 packs/day for 1 year (0.5 ttl pk-yrs)    Types: Cigarettes    Start date: 07/26/1976    Quit date: 07/26/1977    Years since quitting: 46.1    Passive exposure: Never   Smokeless tobacco: Never   Tobacco comments:    barley smoked when smoked   Vaping Use   Vaping status: Never Used  Substance Use Topics   Alcohol use: Yes    Alcohol/week: 0.0 standard drinks of alcohol    Comment: on occasion 1-2 x month   Drug use: No    Review of Systems Per HPI unless  specifically indicated above     Objective:    BP 130/72   Pulse 82   Ht 5\' 2"  (1.575 m)   Wt 174 lb (78.9 kg)   SpO2 100%   BMI 31.83 kg/m   Wt Readings from Last 3 Encounters:  08/21/23 174 lb (78.9 kg)  07/19/23 173 lb (78.5 kg)  07/06/23 175 lb (79.4 kg)    Physical Exam Vitals and nursing note reviewed.  Constitutional:      General: She is not in acute distress.    Appearance: Normal appearance. She is well-developed. She is not diaphoretic.     Comments: Well-appearing, comfortable, cooperative  HENT:     Head: Normocephalic and atraumatic.  Eyes:     General:        Right eye: No discharge.        Left eye: No discharge.     Conjunctiva/sclera: Conjunctivae normal.  Cardiovascular:     Rate and Rhythm: Normal rate.  Pulmonary:     Effort: Pulmonary effort is normal.  Skin:    General: Skin is warm and dry.     Findings: No erythema or rash.  Neurological:     Mental Status: She is alert and oriented to person, place, and time.  Psychiatric:        Mood and Affect: Mood normal.        Behavior: Behavior normal.        Thought Content: Thought content normal.     Comments: Well groomed, good eye contact, normal speech and thoughts    Results for orders placed or performed in visit on 07/25/23  HM DIABETES EYE EXAM  Result Value Ref Range   HM Diabetic Eye Exam No Retinopathy No Retinopathy      Assessment & Plan:   Problem List Items Addressed This Visit     Essential hypertension - Primary    BP improved today  Swelling is down, off Amlodipine  She cannot take Amlodipine  Continue Valsartan 320mg  and Carvedilol 6.25mg  twice a day  If BP raises again, >150/100+ consistently, we can double up on the Carvedilol = x 2 of the 6mg s and dose will be 12.5 mg twice a day.  Can re order if needed  No orders of the defined types were placed in this encounter.    Follow up plan: Return if symptoms worsen or fail to improve.   Saralyn Pilar, DO [facility] 08/21/2023, 11:58 AM

## 2023-08-21 NOTE — Patient Instructions (Addendum)
Thank you for coming to the office today.  BP improved today  Swelling is down, off Amlodipine  She cannot take Amlodipine  Continue Valsartan 320mg  and Carvedilol 6.25mg  twice a day  If BP raises again, >150/100+ consistently, we can double up on the Carvedilol = x 2 of the 6mg s and dose will be 12.5 mg twice a day.  Can re order if needed  Please schedule a Follow-up Appointment to: Return if symptoms worsen or fail to improve.  If you have any other questions or concerns, please feel free to call the office or send a message through MyChart. You may also schedule an earlier appointment if necessary.  Additionally, you may be receiving a survey about your experience at our office within a few days to 1 week by e-mail or mail. We value your feedback.  Saralyn Pilar, DO [facility], [place]

## 2023-08-22 ENCOUNTER — Encounter: Payer: Self-pay | Admitting: Psychiatry

## 2023-08-22 ENCOUNTER — Telehealth (INDEPENDENT_AMBULATORY_CARE_PROVIDER_SITE_OTHER): Payer: Medicare PPO | Admitting: Psychiatry

## 2023-08-22 ENCOUNTER — Encounter: Payer: Self-pay | Admitting: Family Medicine

## 2023-08-22 DIAGNOSIS — F410 Panic disorder [episodic paroxysmal anxiety] without agoraphobia: Secondary | ICD-10-CM

## 2023-08-22 DIAGNOSIS — F3341 Major depressive disorder, recurrent, in partial remission: Secondary | ICD-10-CM

## 2023-08-22 DIAGNOSIS — F4310 Post-traumatic stress disorder, unspecified: Secondary | ICD-10-CM

## 2023-08-22 DIAGNOSIS — F411 Generalized anxiety disorder: Secondary | ICD-10-CM

## 2023-08-22 DIAGNOSIS — Z96652 Presence of left artificial knee joint: Secondary | ICD-10-CM | POA: Diagnosis not present

## 2023-08-22 NOTE — Patient Instructions (Signed)
Continue Viibryd 40 mg daily  Continue Buspar 5 mg twice a day  Next appointment: 11/4 at 8 20

## 2023-08-31 DIAGNOSIS — S93602A Unspecified sprain of left foot, initial encounter: Secondary | ICD-10-CM | POA: Diagnosis not present

## 2023-09-05 ENCOUNTER — Other Ambulatory Visit: Payer: Self-pay | Admitting: Family Medicine

## 2023-09-05 DIAGNOSIS — I2699 Other pulmonary embolism without acute cor pulmonale: Secondary | ICD-10-CM

## 2023-09-06 NOTE — Telephone Encounter (Signed)
Requested Prescriptions  Pending Prescriptions Disp Refills   ELIQUIS 5 MG TABS tablet [Pharmacy Med Name: ELIQUIS 5MG  TABLETS] 60 tablet 1    Sig: TAKE 1 TABLET(5 MG) BY MOUTH TWICE DAILY     Hematology:  Anticoagulants - apixaban Failed - 09/05/2023  3:10 AM      Failed - PLT in normal range and within 360 days    Platelets  Date Value Ref Range Status  06/26/2023 481 (H) 140 - 400 Thousand/uL Final  12/30/2021 431 150 - 450 x10E3/uL Final         Failed - HGB in normal range and within 360 days    Hemoglobin  Date Value Ref Range Status  06/26/2023 10.5 (L) 11.7 - 15.5 g/dL Final  54/27/0623 76.2 11.1 - 15.9 g/dL Final         Failed - HCT in normal range and within 360 days    HCT  Date Value Ref Range Status  06/26/2023 32.8 (L) 35.0 - 45.0 % Final   Hematocrit  Date Value Ref Range Status  12/30/2021 38.0 34.0 - 46.6 % Final         Passed - Cr in normal range and within 360 days    Creatinine  Date Value Ref Range Status  01/11/2021 54.2 20.0 - 300.0 mg/dL Final   Creat  Date Value Ref Range Status  06/26/2023 0.66 0.60 - 1.00 mg/dL Final         Passed - AST in normal range and within 360 days    AST  Date Value Ref Range Status  06/10/2023 19 15 - 41 U/L Final   SGOT(AST)  Date Value Ref Range Status  07/24/2012 26 15 - 37 Unit/L Final   AST (SGOT) Piccolo, Waived  Date Value Ref Range Status  03/07/2018 26 11 - 38 U/L Final         Passed - ALT in normal range and within 360 days    ALT  Date Value Ref Range Status  06/10/2023 13 0 - 44 U/L Final   SGPT (ALT)  Date Value Ref Range Status  07/24/2012 26 12 - 78 U/L Final   ALT (SGPT) Piccolo, Waived  Date Value Ref Range Status  03/07/2018 23 10 - 47 U/L Final         Passed - Valid encounter within last 12 months    Recent Outpatient Visits           2 weeks ago Essential hypertension   [place] [facility] Smitty Cords, DO   1 month ago Essential  hypertension   [place] [facility] Smitty Cords, DO   2 months ago Essential hypertension   [place] [facility], Salvadore Oxford, NP   2 months ago Chronic pain of left knee   [facility] Smitty Cords, DO   5 months ago Acute non-recurrent frontal sinusitis   [place] [facility] [place], [place]ta [place], [place]

## 2023-09-19 ENCOUNTER — Telehealth: Payer: Self-pay | Admitting: Family Medicine

## 2023-09-19 NOTE — Telephone Encounter (Signed)
Called LM 09/19/2023 to schedule AWV   Verlee Rossetti; Care Guide Ambulatory Clinical Support [place] [facility] Direct Dial: [phone]

## 2023-10-04 ENCOUNTER — Inpatient Hospital Stay: Payer: Medicare PPO | Admitting: Internal Medicine

## 2023-10-04 ENCOUNTER — Inpatient Hospital Stay: Payer: Medicare PPO

## 2023-10-09 ENCOUNTER — Inpatient Hospital Stay: Payer: Medicare PPO | Admitting: Internal Medicine

## 2023-10-09 ENCOUNTER — Inpatient Hospital Stay: Payer: Medicare PPO | Attending: Internal Medicine

## 2023-10-09 VITALS — BP 149/80 | HR 80 | Temp 100.0°F | Wt 177.0 lb

## 2023-10-09 DIAGNOSIS — Z803 Family history of malignant neoplasm of breast: Secondary | ICD-10-CM | POA: Insufficient documentation

## 2023-10-09 DIAGNOSIS — Z903 Acquired absence of stomach [part of]: Secondary | ICD-10-CM

## 2023-10-09 DIAGNOSIS — D509 Iron deficiency anemia, unspecified: Secondary | ICD-10-CM | POA: Diagnosis not present

## 2023-10-09 DIAGNOSIS — D508 Other iron deficiency anemias: Secondary | ICD-10-CM

## 2023-10-09 DIAGNOSIS — Z87891 Personal history of nicotine dependence: Secondary | ICD-10-CM | POA: Diagnosis not present

## 2023-10-09 DIAGNOSIS — Z9884 Bariatric surgery status: Secondary | ICD-10-CM | POA: Diagnosis not present

## 2023-10-09 LAB — CBC WITH DIFFERENTIAL/PLATELET
Abs Immature Granulocytes: 0.04 10*3/uL (ref 0.00–0.07)
Basophils Absolute: 0 10*3/uL (ref 0.0–0.1)
Basophils Relative: 0 %
Eosinophils Absolute: 0.1 10*3/uL (ref 0.0–0.5)
Eosinophils Relative: 2 %
HCT: 35.5 % — ABNORMAL LOW (ref 36.0–46.0)
Hemoglobin: 11.1 g/dL — ABNORMAL LOW (ref 12.0–15.0)
Immature Granulocytes: 1 %
Lymphocytes Relative: 18 %
Lymphs Abs: 1.3 10*3/uL (ref 0.7–4.0)
MCH: 26.5 pg (ref 26.0–34.0)
MCHC: 31.3 g/dL (ref 30.0–36.0)
MCV: 84.7 fL (ref 80.0–100.0)
Monocytes Absolute: 0.4 10*3/uL (ref 0.1–1.0)
Monocytes Relative: 6 %
Neutro Abs: 5.4 10*3/uL (ref 1.7–7.7)
Neutrophils Relative %: 73 %
Platelets: 291 10*3/uL (ref 150–400)
RBC: 4.19 MIL/uL (ref 3.87–5.11)
RDW: 14.6 % (ref 11.5–15.5)
WBC: 7.3 10*3/uL (ref 4.0–10.5)
nRBC: 0 % (ref 0.0–0.2)

## 2023-10-09 LAB — IRON AND TIBC
Iron: 50 ug/dL (ref 28–170)
Saturation Ratios: 18 % (ref 10.4–31.8)
TIBC: 274 ug/dL (ref 250–450)
UIBC: 224 ug/dL

## 2023-10-09 LAB — FERRITIN: Ferritin: 32 ng/mL (ref 11–307)

## 2023-10-09 NOTE — Progress Notes (Signed)
[facility]  [phone] [phone]  ID: Amanda Wang OB: 05-29-1951  MR#: [id number]  IEP#:[id number]  Patient Care Team: Smitty Cords, DO as PCP - General (Family Medicine) Midge Minium, MD as Consulting Physician (Gastroenterology) Michaelyn Barter, MD as Consulting Physician (Oncology)   HPI: Amanda Wang is a 72 y.o. female with past medical history of hypertension, hyperlipidemia, GERD, chiari malformation type I, arthritis, sleep apnea was referred to hematology for management of iron deficiency anemia  Patient was seen by primary on February 23, 2023 for symptomatic anemia.  She feels very tired.  CBC showed hemoglobin 8.5, MCV 73.5.  Iron panel showed saturation 9%.  Ferritin 6.  B12 level 561.  Patient has history of gastric bypass surgery.  She has chronic history of iron deficiency anemia.  Colonoscopy performed for the same reason by Dr. Tobi Bastos in November 2022 was normal.  Endoscopy showed multiple 5 to 10 mm sessile polyp without bleeding in the stomach.  Biopsy showed hyperplastic polyps.  Completed IV Venofer in April 2024.  Interval history- Patient seen today as follow-up for iron deficiency anemia, labs. Her major concern today is feeling increasingly tired and had an episode of dozing off during a class.  Feels similar to when her iron levels had gone down.  She denies any bleeding in urine or stool.  REVIEW OF SYSTEMS:   ROS  As per HPI. Otherwise, a complete review of systems is negative.  PAST MEDICAL HISTORY: Past Medical History:  Diagnosis Date   Advanced care planning/counseling discussion 09/16/2018   Anemia    Arthritis    Chiari malformation type I (HCC)    Depression    Diabetes mellitus, type 2 (HCC)    GERD (gastroesophageal reflux disease)    History of hiatal hernia    Hyperlipidemia    Hypertension    Iron deficiency anemia    Irritable bowel syndrome with diarrhea    Osteoarthritis  of left knee    PONV (postoperative nausea and vomiting)    Restless legs    Seasonal allergies    Sleep apnea    no Cpap use   Small intestinal bacterial overgrowth    Spinal stenosis    Vaccine reaction, initial encounter 03/11/2020    PAST SURGICAL HISTORY: Past Surgical History:  Procedure Laterality Date   BREAST BIOPSY Right    neg   chiari malformation I decompression surgery  10/2005   COLONOSCOPY WITH PROPOFOL N/A 11/28/2018   Procedure: COLONOSCOPY WITH PROPOFOL;  Surgeon: Wyline Mood, MD;  Location: [facility] ENDOSCOPY;  Service: Gastroenterology;  Laterality: N/A;   COLONOSCOPY WITH PROPOFOL N/A 10/27/2021   Procedure: COLONOSCOPY WITH PROPOFOL;  Surgeon: Wyline Mood, MD;  Location: [facility] ENDOSCOPY;  Service: Gastroenterology;  Laterality: N/A;   ESOPHAGOGASTRODUODENOSCOPY N/A 10/27/2021   Procedure: ESOPHAGOGASTRODUODENOSCOPY (EGD);  Surgeon: Wyline Mood, MD;  Location: [facility] ENDOSCOPY;  Service: Gastroenterology;  Laterality: N/A;   HERNIA REPAIR     s/p gastric sleeve   KNEE ARTHROSCOPY WITH MEDIAL MENISECTOMY Left 01/12/2022   Procedure: KNEE ARTHROSCOPY WITH MEDIAL MENISECTOMY;  Surgeon: Juanell Fairly, MD;  Location: [facility] ORS;  Service: Orthopedics;  Laterality: Left;   LAPAROSCOPIC GASTRIC SLEEVE RESECTION     LASIK Bilateral    NASAL SEPTUM SURGERY     TOTAL KNEE ARTHROPLASTY Left 06/05/2023   Procedure: TOTAL KNEE ARTHROPLASTY;  Surgeon: Juanell Fairly, MD;  Location: [facility] ORS;  Service: Orthopedics;  Laterality: Left;    FAMILY HISTORY: Family History  Problem  Relation Age of Onset   Hypertension Mother    Cancer Mother    Stroke Mother    Heart disease Father    Breast cancer Sister 33   Breast cancer Paternal Aunt    Diabetes Paternal Grandmother     HEALTH MAINTENANCE: Social History   Tobacco Use   Smoking status: Former    Current packs/day: 0.00    Average packs/day: 0.5 packs/day for 1 year (0.5 ttl pk-yrs)    Types: Cigarettes    Start  date: 07/26/1976    Quit date: 07/26/1977    Years since quitting: 46.2    Passive exposure: Never   Smokeless tobacco: Never   Tobacco comments:    barley smoked when smoked   Vaping Use   Vaping status: Never Used  Substance Use Topics   Alcohol use: Yes    Alcohol/week: 0.0 standard drinks of alcohol    Comment: on occasion 1-2 x month   Drug use: No     Allergies  Allergen Reactions   Fexofenadine Anxiety    Makes her have a panic attack   Amlodipine Swelling   Codeine Nausea Only    Dizziness   Methylphenidate Hcl     "Caused a seizure"   Zoloft [Sertraline] Other (See Comments)    Sleepiness   Magnevist [Gadopentetate] Hives and Itching    IV Contrast   Sulfa Antibiotics Rash    Current Outpatient Medications  Medication Sig Dispense Refill   ACCU-CHEK GUIDE test strip Use to check blood sugar 1 x daily as instructed 100 strip 3   Accu-Chek Softclix Lancets lancets TEST TWO TO THREE TIMES DAILY DX E11.69 300 each 2   acetaminophen (TYLENOL) 325 MG tablet Take 2 tablets (650 mg total) by mouth every 6 (six) hours as needed for mild pain, moderate pain, fever or headache (or Fever >/= 101).     albuterol (VENTOLIN HFA) 108 (90 Base) MCG/ACT inhaler INHALE 2 INHALATIONS INTO THE LUNGS EVERY 4 HOURS AS NEEDED     atorvastatin (LIPITOR) 40 MG tablet Take 1 tablet (40 mg total) by mouth at bedtime. 90 tablet 3   azelastine (ASTELIN) 0.1 % nasal spray Place 1 spray into both nostrils 2 (two) times daily.     Blood Glucose Monitoring Suppl (ACCU-CHEK GUIDE) w/Device KIT 1 Device by Does not apply route 4 (four) times daily. DX E11.69 1 kit 0   busPIRone (BUSPAR) 5 MG tablet Take 1 tablet (5 mg total) by mouth 2 (two) times daily. 180 tablet 0   Carboxymethylcellul-Glycerin (LUBRICATING EYE DROPS OP) Place 1 drop into both eyes daily as needed (dry eyes).     carvedilol (COREG) 6.25 MG tablet Take 1 tablet (6.25 mg total) by mouth 2 (two) times daily with a meal. 180 tablet 3    Cholecalciferol 125 MCG (5000 UT) TABS Take 1 tablet by mouth daily.     cyclobenzaprine (FLEXERIL) 10 MG tablet Take 10 mg by mouth 3 (three) times daily as needed for muscle spasms.     ELIQUIS 5 MG TABS tablet TAKE 1 TABLET(5 MG) BY MOUTH TWICE DAILY 60 tablet 1   levETIRAcetam (KEPPRA) 500 MG tablet Take 1 tablet (500 mg total) by mouth at bedtime.     Lidocaine-Menthol (ICY HOT MAX LIDOCAINE EX) Apply 1 application. topically daily as needed (pain).     LORazepam (ATIVAN) 1 MG tablet Take 0.5-1 tablets (0.5-1 mg total) by mouth daily as needed for anxiety. 30 tablet 2   omeprazole (  PRILOSEC) 40 MG capsule Take 1 capsule (40 mg total) by mouth daily before breakfast. (Patient taking differently: Take 40 mg by mouth at bedtime.) 90 capsule 3   Spacer/Aero-Holding Chambers (AEROCHAMBER PLUS WITH MASK) inhaler Use with inhaler as needed. 1 each 0   valsartan (DIOVAN) 320 MG tablet Take 1 tablet (320 mg total) by mouth daily. 90 tablet 3   Vilazodone HCl (VIIBRYD) 40 MG TABS TAKE 1 TABLET(40 MG) BY MOUTH DAILY 90 tablet 0   potassium chloride SA (KLOR-CON M) 20 MEQ tablet Take 1 tablet (20 mEq total) by mouth daily for 3 days. 3 tablet 0   No current facility-administered medications for this visit.    OBJECTIVE: Vitals:   10/09/23 1424  BP: (!) 149/80  Pulse: 80  Temp: 100 F (37.8 C)  SpO2: 98%     Body mass index is 32.37 kg/m.      General: Well-developed, well-nourished, no acute distress. Eyes: Pink conjunctiva, anicteric sclera. HEENT: Normocephalic, moist mucous membranes, clear oropharnyx. Lungs: Clear to auscultation bilaterally. Heart: Regular rate and rhythm. No rubs, murmurs, or gallops. Abdomen: Soft, nontender, nondistended. No organomegaly noted, normoactive bowel sounds. Musculoskeletal: No edema, cyanosis, or clubbing. Neuro: Alert, answering all questions appropriately. Cranial nerves grossly intact. Skin: No rashes or petechiae noted. Psych: Normal  affect. Lymphatics: No palpable lymphadenopathy in the neck.   LAB RESULTS:  Lab Results  Component Value Date   NA 143 06/26/2023   K 4.0 06/26/2023   CL 103 06/26/2023   CO2 30 06/26/2023   GLUCOSE 111 06/26/2023   BUN 11 06/26/2023   CREATININE 0.66 06/26/2023   CALCIUM 9.4 06/26/2023   PROT 6.4 (L) 06/10/2023   ALBUMIN 3.1 (L) 06/10/2023   AST 19 06/10/2023   ALT 13 06/10/2023   ALKPHOS 63 06/10/2023   BILITOT 1.0 06/10/2023   GFRNONAA >60 06/11/2023   GFRAA 96 01/11/2021    Lab Results  Component Value Date   WBC 7.3 10/09/2023   NEUTROABS 5.4 10/09/2023   HGB 11.1 (L) 10/09/2023   HCT 35.5 (L) 10/09/2023   MCV 84.7 10/09/2023   PLT 291 10/09/2023    Lab Results  Component Value Date   TIBC 274 06/04/2023   TIBC 342 02/20/2023   TIBC 298 12/30/2021   FERRITIN 67 06/04/2023   FERRITIN 6 (L) 02/20/2023   FERRITIN 27 12/30/2021   IRONPCTSAT 20 06/04/2023   IRONPCTSAT 9 (L) 02/20/2023   IRONPCTSAT 14 (L) 12/30/2021     STUDIES: No results found.  ASSESSMENT AND PLAN:   Amanda Wang is a 72 y.o. female with pmh of hypertension, hyperlipidemia, GERD, chiari malformation type I, arthritis, sleep apnea was referred to hematology for management of iron deficiency anemia  # Iron deficiency anemia # History of gastric bypass surgery -Likely malabsorption from gastric bypass surgery. Colonoscopy performed by Dr. Tobi Bastos in November 2022 was normal.  Endoscopy showed multiple 5 to 10 mm sessile polyp without bleeding in the stomach.  Biopsy showed hyperplastic polyps.  -Ferritin of 6 and Hb 8.5 (02/21/2023).  Completed IV Venofer 200 mg weekly x 5 doses in April 2024.  Hemoglobin today is 11.1 slightly lower than last time.  Her iron level is pending.  If the iron stores come back low, we will call her to schedule for iron infusion.  She has been feeling more fatigue recently similar to when her iron levels go down.  -Continue with vitamin B12 1000 mcg daily.  B12  level from June 2024  308.   Orders Placed This Encounter  Procedures   CBC with Differential (Cancer Center Only)   Iron and TIBC   Ferritin   Vitamin B12   RTC in 6 months for MD visit, labs   Patient expressed understanding and was in agreement with this plan. She also understands that She can call clinic at any time with any questions, concerns, or complaints.   I spent a total of 25 minutes reviewing chart data, face-to-face evaluation with the patient, counseling and coordination of care as detailed above.  Michaelyn Barter, MD   10/09/2023 3:57 PM

## 2023-10-09 NOTE — Progress Notes (Signed)
She is back to feeling real tired and sleepy like she was before she started her first iron infusion, which was back near the beginning of the year.

## 2023-10-13 ENCOUNTER — Encounter: Payer: Self-pay | Admitting: Internal Medicine

## 2023-10-22 ENCOUNTER — Other Ambulatory Visit: Payer: Self-pay | Admitting: Psychiatry

## 2023-10-22 MED ORDER — BUSPIRONE HCL 5 MG PO TABS: 5.0000 mg | ORAL_TABLET | Freq: Two times a day (BID) | ORAL | 0 refills | Status: DC
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
## 2023-11-06 ENCOUNTER — Telehealth: Payer: Self-pay

## 2023-11-06 NOTE — Patient Outreach (Signed)
Attempted to contact patient regarding MM, AWV. Left voicemail for patient to return my call at [phone].  Nicholes Rough, CMA Care Guide [employer]

## 2023-11-07 ENCOUNTER — Telehealth: Payer: Self-pay

## 2023-11-07 ENCOUNTER — Telehealth (INDEPENDENT_AMBULATORY_CARE_PROVIDER_SITE_OTHER): Payer: Medicare PPO | Admitting: Psychiatry

## 2023-11-07 ENCOUNTER — Encounter: Payer: Self-pay | Admitting: Psychiatry

## 2023-11-07 DIAGNOSIS — F3341 Major depressive disorder, recurrent, in partial remission: Secondary | ICD-10-CM

## 2023-11-07 DIAGNOSIS — F411 Generalized anxiety disorder: Secondary | ICD-10-CM

## 2023-11-07 DIAGNOSIS — F410 Panic disorder [episodic paroxysmal anxiety] without agoraphobia: Secondary | ICD-10-CM

## 2023-11-07 DIAGNOSIS — F4310 Post-traumatic stress disorder, unspecified: Secondary | ICD-10-CM

## 2023-11-07 MED ORDER — VILAZODONE HCL 40 MG PO TABS: 40.0000 mg | ORAL_TABLET | Freq: Every day | ORAL | 1 refills | Status: DC

## 2023-11-07 NOTE — Patient Instructions (Signed)
Continue Viibryd 40 mg daily  Continue Buspar 5 mg twice a day  Next appointment: 2/5 at 8 40

## 2023-11-07 NOTE — Telephone Encounter (Signed)
went online to [email or web address] and submitted the prior auth for the vilazodone - pending

## 2023-11-07 NOTE — Telephone Encounter (Signed)
prior auth approved from 12-18-22 until 12-17-24 pa case # [id number]

## 2023-11-07 NOTE — Telephone Encounter (Signed)
received email that a prior auth was needed for the vilazodone hcl 40mg 

## 2023-11-09 ENCOUNTER — Other Ambulatory Visit: Payer: Self-pay | Admitting: Family Medicine

## 2023-11-09 DIAGNOSIS — I10 Essential (primary) hypertension: Secondary | ICD-10-CM

## 2023-11-12 NOTE — Telephone Encounter (Signed)
Carvedilol ordered 02/07/23 #180 3 RF too soon  Requested Prescriptions  Refused Prescriptions Disp Refills   carvedilol (COREG) 6.25 MG tablet [Pharmacy Med Name: CARVEDILOL 6.25MG  TABLETS] 180 tablet 3    Sig: TAKE 1 TABLET(6.25 MG) BY MOUTH TWICE DAILY WITH A MEAL     Cardiovascular: Beta Blockers 3 Failed - 11/09/2023  8:09 AM      Failed - Last BP in normal range    BP Readings from Last 1 Encounters:  10/09/23 (!) 149/80         Passed - Cr in normal range and within 360 days    Creatinine  Date Value Ref Range Status  01/11/2021 54.2 20.0 - 300.0 mg/dL Final   Creat  Date Value Ref Range Status  06/26/2023 0.66 0.60 - 1.00 mg/dL Final         Passed - AST in normal range and within 360 days    AST  Date Value Ref Range Status  06/10/2023 19 15 - 41 U/L Final   SGOT(AST)  Date Value Ref Range Status  07/24/2012 26 15 - 37 Unit/L Final   AST (SGOT) Piccolo, Waived  Date Value Ref Range Status  03/07/2018 26 11 - 38 U/L Final         Passed - ALT in normal range and within 360 days    ALT  Date Value Ref Range Status  06/10/2023 13 0 - 44 U/L Final   SGPT (ALT)  Date Value Ref Range Status  07/24/2012 26 12 - 78 U/L Final   ALT (SGPT) Piccolo, Waived  Date Value Ref Range Status  03/07/2018 23 10 - 47 U/L Final         Passed - Last Heart Rate in normal range    Pulse Readings from Last 1 Encounters:  10/09/23 80         Passed - Valid encounter within last 6 months    Recent Outpatient Visits           2 months ago Essential hypertension   [place] [facility] Smitty Cords, DO   3 months ago Essential hypertension   [place] [facility] Smitty Cords, DO   4 months ago Essential hypertension   [place] [facility] [place], [place], NP   4 months ago Chronic pain of left knee   [facility] Smitty Cords, DO   7 months ago Acute  non-recurrent frontal sinusitis   [place] [place], Netta [place], [place]

## 2023-11-14 ENCOUNTER — Ambulatory Visit: Payer: Self-pay

## 2023-11-14 NOTE — Telephone Encounter (Signed)
Chief Complaint: Anxiety Symptoms: mild to moderate anxiety, low grade fever Frequency: constant started this morning Pertinent Negatives: Patient denies thoughts of self harm, or harm to others Disposition: [] ED /[] Urgent Care (no appt availability in office) / [] Appointment(In office/virtual)/ []  [place] [facility]/ [x] Home Care/ [] Refused Recommended Disposition /[] [facility] [facility]/ []  Follow-up with PCP Additional Notes: Patient stated she woke up feeling anxious and the symptoms are mild to moderate at this time and patient thinks the cause is that she had a nightmare last night and has a low grade fever of 99.5. Patient states she has not taken her anxiety medication yet. Care advice was given and patient stated she will try taking her xanax, tylenol and take a nap  to see if her symptoms improve. Patient stated she will call back if symptoms don't improve. Advised patient to update her mental health provider as well.  Reason for Disposition  MODERATE anxiety (e.g., persistent or frequent anxiety symptoms; interferes with sleep, school, or work)  Answer Assessment - Initial Assessment Questions 1. CONCERN: "Did anything happen that prompted you to call today?"      I don't feel good, I have a fever  2. ANXIETY SYMPTOMS: "Can you describe how you (your loved one; patient) have been feeling?" (e.g., tense, restless, panicky, anxious, keyed up, overwhelmed, sense of impending doom).      Anxious and overwhelmed  3. ONSET: "How long have you been feeling this way?" (e.g., hours, days, weeks)     This morning  4. SEVERITY: "How would you rate the level of anxiety?" (e.g., 0 - 10; or mild, moderate, severe).     Mild to Moderate  5. FUNCTIONAL IMPAIRMENT: "How have these feelings affected your ability to do daily activities?" "Have you had more difficulty than usual doing your normal daily activities?" (e.g., getting better, same, worse; self-care, school, work, interactions)     No   6. HISTORY: "Have you felt this way before?" "Have you ever been diagnosed with an anxiety problem in the past?" (e.g., generalized anxiety disorder, panic attacks, PTSD). If Yes, ask: "How was this problem treated?" (e.g., medicines, counseling, etc.)     Yes, general anxiety with medication  7. RISK OF HARM - SUICIDAL IDEATION: "Do you ever have thoughts of hurting or killing yourself?" If Yes, ask:  "Do you have these feelings now?" "Do you have a plan on how you would do this?"     No  8. TREATMENT:  "What has been done so far to treat this anxiety?" (e.g., medicines, relaxation strategies). "What has helped?"     Buspar and Vilazodone 9. TREATMENT - THERAPIST: "Do you have a counselor or therapist? Name?"     Dr. Fredric Mare  10. POTENTIAL TRIGGERS: "Do you drink caffeinated beverages (e.g., coffee, colas, teas), and how much daily?" "Do you drink alcohol or use any drugs?" "Have you started any new medicines recently?"       No 11. PATIENT SUPPORT: "Who is with you now?" "Who do you live with?" "Do you have family or friends who you can talk to?"        Yes I do  12. OTHER SYMPTOMS: "Do you have any other symptoms?" (e.g., feeling depressed, trouble concentrating, trouble sleeping, trouble breathing, palpitations or fast heartbeat, chest pain, sweating, nausea, or diarrhea)       Fever, chest pain, nightmare last night, trouble sleeping last night  Protocols used: Anxiety and Panic Attack-A-AH

## 2023-11-23 ENCOUNTER — Other Ambulatory Visit: Payer: Self-pay | Admitting: Family Medicine

## 2023-11-23 DIAGNOSIS — I10 Essential (primary) hypertension: Secondary | ICD-10-CM

## 2023-11-26 NOTE — Telephone Encounter (Signed)
Requested Prescriptions  Pending Prescriptions Disp Refills   valsartan (DIOVAN) 320 MG tablet [Pharmacy Med Name: VALSARTAN 320MG  TABLETS] 90 tablet 0    Sig: TAKE 1 TABLET(320 MG) BY MOUTH DAILY     Cardiovascular:  Angiotensin Receptor Blockers Failed - 11/23/2023  2:54 PM      Failed - Last BP in normal range    BP Readings from Last 1 Encounters:  10/09/23 (!) 149/80         Passed - Cr in normal range and within 180 days    Creatinine  Date Value Ref Range Status  01/11/2021 54.2 20.0 - 300.0 mg/dL Final   Creat  Date Value Ref Range Status  06/26/2023 0.66 0.60 - 1.00 mg/dL Final         Passed - K in normal range and within 180 days    Potassium  Date Value Ref Range Status  06/26/2023 4.0 3.5 - 5.3 mmol/L Final  07/24/2012 4.2 3.5 - 5.1 mmol/L Final         Passed - Patient is not pregnant      Passed - Valid encounter within last 6 months    Recent Outpatient Visits           3 months ago Essential hypertension   [place] [facility] Smitty Cords, DO   4 months ago Essential hypertension   [place] [facility] Smitty Cords, DO   4 months ago Essential hypertension   [place] [facility] [place], [place], NP   5 months ago Chronic pain of left knee   [facility] Smitty Cords, DO   8 months ago Acute non-recurrent frontal sinusitis   [place] [facility] [place], Netta [place], [place]

## 2023-12-13 ENCOUNTER — Ambulatory Visit: Payer: Medicare PPO | Admitting: Family Medicine

## 2023-12-13 ENCOUNTER — Other Ambulatory Visit: Payer: Self-pay | Admitting: Family Medicine

## 2023-12-13 ENCOUNTER — Encounter: Payer: Self-pay | Admitting: Family Medicine

## 2023-12-13 VITALS — BP 122/80 | HR 79 | Ht 62.0 in | Wt 173.0 lb

## 2023-12-13 DIAGNOSIS — Z0000 Encounter for general adult medical examination without abnormal findings: Secondary | ICD-10-CM

## 2023-12-13 DIAGNOSIS — F419 Anxiety disorder, unspecified: Secondary | ICD-10-CM | POA: Diagnosis not present

## 2023-12-13 DIAGNOSIS — E1169 Type 2 diabetes mellitus with other specified complication: Secondary | ICD-10-CM

## 2023-12-13 DIAGNOSIS — I10 Essential (primary) hypertension: Secondary | ICD-10-CM

## 2023-12-13 DIAGNOSIS — E782 Mixed hyperlipidemia: Secondary | ICD-10-CM

## 2023-12-13 DIAGNOSIS — E559 Vitamin D deficiency, unspecified: Secondary | ICD-10-CM

## 2023-12-13 NOTE — Patient Instructions (Addendum)
Thank you for coming to the office today.  Step 1 Pause buspar temporarily for a few days  Keep on Viibryd 40mg  daily See how you feel, if the reduced medicines improves your feeling overall, we can keep Viibryd 40mg   Step 2 If prefer to also lower Viibryd, move to a half tablet for 1-2 weeks, and see how you feel Let me or Dr Vanetta Shawl know, we can order the 20mg  dose if / when ready  Step 3 Consider re-adding Buspar back in either every day or as needed.  DUE for FASTING BLOOD WORK (no food or drink after midnight before the lab appointment, only water or coffee without cream/sugar on the morning of)  SCHEDULE "Lab Only" visit in the morning at the clinic for lab draw in 3 MONTHS   - Make sure Lab Only appointment is at about 1 week before your next appointment, so that results will be available  For Lab Results, once available within 2-3 days of blood draw, you can can log in to MyChart online to view your results and a brief explanation. Also, we can discuss results at next follow-up visit.   Please schedule a Follow-up Appointment to: Return for 3 month fasting lab > 1 week later Annual Physical.  If you have any other questions or concerns, please feel free to call the office or send a message through MyChart. You may also schedule an earlier appointment if necessary.  Additionally, you may be receiving a survey about your experience at our office within a few days to 1 week by e-mail or mail. We value your feedback.  Saralyn Pilar, DO [facility], [place]

## 2023-12-13 NOTE — Progress Notes (Signed)
Subjective:    Patient ID: Amanda Wang, female    DOB: 1951-08-23, 72 y.o.   MRN: [id number]  Amanda Wang is a 72 y.o. female presenting on 12/13/2023 for Hypertension   HPI  Discussed the use of AI scribe software for clinical note transcription with the patient, who gave verbal consent to proceed.  History of Present Illness     The patient, with a history of hypertension and anxiety, presents for a follow-up consultation primarily concerning her current antidepressant regimen. She expresses dissatisfaction with her current medication, citing a dependency that manifests as heightened anxiety and palpitations if the morning dose is missed. This discomfort is noted to occur around 11 AM if the morning dose, usually taken at 8 AM, is skipped. The patient is currently on Viibryd (40mg ) and Buspirone, both taken at the same time in the morning. She expresses concern about the combination of these two medications, citing information from the [facility] suggesting potential adverse effects.  The patient also reports a history of delirium, which resulted in rehospitalization during the summer. She also mentions occasional panic attacks, which she believes may not necessitate the current dosage of her medication.  The patient has been experiencing nightmares for the past eight weeks, which she has never had before. She reports taking Lorazepam for about a week to manage these nightmares, which seemed to help. However, she also notes that her heart pounds at times, especially when she feels anxious.  The patient is also on Levetiracetam (500mg ) for an history of seizure condition, taken once daily at night.  She reports that her knee, which had been causing her discomfort, is improving and she is now able to wear regular shoes and jeans. She also mentions that she is currently seeing a counselor for her anxiety.  The patient expresses a desire to reduce her medication load, questioning  the necessity of her current regimen.  ----------------  Major Depression recurrent  Generalized Anxiety / Panic Attacks Followed by [facility] Recent updates Recently added Buspar 5mg  TWICE A DAY in addition to Viibryd 40mg  On Lorazepam 1mg  taking half tab AS NEEDED, usually 30 pills x 2 would last up to 6 months. She has been taking Lorazepam more regularly due to nightmares and sleep, and then has improved, has reduced doses of Lorazepam last video visit 11/07/23 Dr Vanetta Shawl Following w/ Counselor psychology She would like to reduce her psych medications  CHRONIC HTN: BP has been controlled Previously OFF Amlodipine d/t leg swelling. And OFF hydrochlorothiazide due to muscle cramps Meds - currently on Valsartan 320mg  daily, Carvedilol 6.25mg  TWICE A DAY       12/13/2023   11:04 AM 06/26/2023    2:20 PM 02/23/2023    4:30 PM  Depression screen PHQ 2/9  Decreased Interest 1 0 1  Down, Depressed, Hopeless 0 0 0  PHQ - 2 Score 1 0 1  Altered sleeping 0 0 1  Tired, decreased energy 1 2 2   Change in appetite 0 0 2  Feeling bad or failure about yourself  0 0 0  Trouble concentrating 0 0 0  Moving slowly or fidgety/restless 0 0 0  Suicidal thoughts 0 0 0  PHQ-9 Score 2 2 6   Difficult doing work/chores Somewhat difficult  Somewhat difficult       12/13/2023   11:04 AM 06/26/2023    2:21 PM 02/23/2023    4:31 PM 02/07/2023    5:09 PM  GAD 7 : Generalized Anxiety Score  Nervous, Anxious, on Edge 1 0 1 1  Control/stop worrying 1 0 1 1  Worry too much - different things 1 0 1 1  Trouble relaxing 0 0 0 1  Restless 0 0 0 0  Easily annoyed or irritable 0 0 0 0  Afraid - awful might happen 0 0 0 0  Total GAD 7 Score 3 0 3 4  Anxiety Difficulty  Not difficult at all Not difficult at all Not difficult at all    Social History   Tobacco Use   Smoking status: Former    Current packs/day: 0.00    Average packs/day: 0.5 packs/day for 1 year (0.5 ttl pk-yrs)    Types: Cigarettes     Start date: 07/26/1976    Quit date: 07/26/1977    Years since quitting: 46.4    Passive exposure: Never   Smokeless tobacco: Never   Tobacco comments:    barley smoked when smoked   Vaping Use   Vaping status: Never Used  Substance Use Topics   Alcohol use: Yes    Alcohol/week: 0.0 standard drinks of alcohol    Comment: on occasion 1-2 x month   Drug use: No    Review of Systems Per HPI unless specifically indicated above     Objective:    BP 122/80   Pulse 79   Ht 5\' 2"  (1.575 m)   Wt 173 lb (78.5 kg)   SpO2 95%   BMI 31.64 kg/m   Wt Readings from Last 3 Encounters:  12/13/23 173 lb (78.5 kg)  10/09/23 177 lb (80.3 kg)  08/21/23 174 lb (78.9 kg)    Physical Exam Vitals and nursing note reviewed.  Constitutional:      General: She is not in acute distress.    Appearance: Normal appearance. She is well-developed. She is not diaphoretic.     Comments: Well-appearing, comfortable, cooperative  HENT:     Head: Normocephalic and atraumatic.  Eyes:     General:        Right eye: No discharge.        Left eye: No discharge.     Conjunctiva/sclera: Conjunctivae normal.  Cardiovascular:     Rate and Rhythm: Normal rate.  Pulmonary:     Effort: Pulmonary effort is normal.  Skin:    General: Skin is warm and dry.     Findings: No erythema or rash.  Neurological:     Mental Status: She is alert and oriented to person, place, and time.  Psychiatric:        Mood and Affect: Mood normal.        Behavior: Behavior normal.        Thought Content: Thought content normal.     Comments: Well groomed, good eye contact, normal speech and thoughts     Results for orders placed or performed in visit on 10/09/23  Ferritin   Collection Time: 10/09/23  2:09 PM  Result Value Ref Range   Ferritin 32 11 - 307 ng/mL  Iron and TIBC   Collection Time: 10/09/23  2:09 PM  Result Value Ref Range   Iron 50 28 - 170 ug/dL   TIBC 161 096 - 045 ug/dL   Saturation Ratios 18 10.4 -  31.8 %   UIBC 224 ug/dL  CBC with Differential/Platelet   Collection Time: 10/09/23  2:09 PM  Result Value Ref Range   WBC 7.3 4.0 - 10.5 K/uL   RBC 4.19 3.87 - 5.11 MIL/uL  Hemoglobin 11.1 (L) 12.0 - 15.0 g/dL   HCT 02.5 (L) 42.7 - 06.2 %   MCV 84.7 80.0 - 100.0 fL   MCH 26.5 26.0 - 34.0 pg   MCHC 31.3 30.0 - 36.0 g/dL   RDW 37.6 28.3 - 15.1 %   Platelets 291 150 - 400 K/uL   nRBC 0.0 0.0 - 0.2 %   Neutrophils Relative % 73 %   Neutro Abs 5.4 1.7 - 7.7 K/uL   Lymphocytes Relative 18 %   Lymphs Abs 1.3 0.7 - 4.0 K/uL   Monocytes Relative 6 %   Monocytes Absolute 0.4 0.1 - 1.0 K/uL   Eosinophils Relative 2 %   Eosinophils Absolute 0.1 0.0 - 0.5 K/uL   Basophils Relative 0 %   Basophils Absolute 0.0 0.0 - 0.1 K/uL   Immature Granulocytes 1 %   Abs Immature Granulocytes 0.04 0.00 - 0.07 K/uL      Assessment & Plan:   Problem List Items Addressed This Visit     Anxiety - Primary      Anxiety and Depression Followed by [facility] She is currently on Viibryd 40mg  daily and Buspar 10mg  twice daily. She is also on Lorazepam as needed, but does not take it often. She is questioning the need for all these medications and is interested in reducing her medication load. She is also in regular counseling.  -Pause Buspar for a few days to assess if symptoms improve. Question if polypharmacy or buspar side effect -If preferred, consider reducing Viibryd to 20mg  daily. Patient can temporarily cut the 40mg  tablet in half for 1-2 weeks to assess how she feels. If she feels better on the lower dose, a new prescription for Viibryd 20mg  can be ordered. -Consider re-adding Buspar back in either daily or as needed after adjusting Viibryd dose.  Hypertension Patient is on Carvedilol twice daily and Valsartan once daily. Blood pressure at the visit was 122/80. -Continue current antihypertensive regimen.  Seizure Disorder Patient is on Levetiracetam 500mg  daily at bedtime. She reports having a  surplus of this medication. -Continue current dose. Patient to message doctor when she needs a new prescription.  General Health Maintenance Patient is due for a yearly physical and blood panel in March. -Schedule yearly physical and blood panel in March 2025.         No orders of the defined types were placed in this encounter.   No orders of the defined types were placed in this encounter.   Follow up plan: Return for 3 month fasting lab > 1 week later Annual Physical.  Future labs ordered for 03/13/24   Saralyn Pilar, DO [facility] 12/13/2023, 11:19 AM

## 2024-01-10 ENCOUNTER — Ambulatory Visit (INDEPENDENT_AMBULATORY_CARE_PROVIDER_SITE_OTHER): Payer: Medicare PPO | Admitting: Family Medicine

## 2024-01-10 ENCOUNTER — Encounter: Payer: Self-pay | Admitting: Family Medicine

## 2024-01-10 ENCOUNTER — Ambulatory Visit: Payer: Self-pay

## 2024-01-10 VITALS — BP 160/90 | HR 92 | Ht 62.0 in | Wt 168.0 lb

## 2024-01-10 DIAGNOSIS — R0602 Shortness of breath: Secondary | ICD-10-CM

## 2024-01-10 DIAGNOSIS — J9801 Acute bronchospasm: Secondary | ICD-10-CM | POA: Diagnosis not present

## 2024-01-10 DIAGNOSIS — J069 Acute upper respiratory infection, unspecified: Secondary | ICD-10-CM

## 2024-01-10 DIAGNOSIS — I10 Essential (primary) hypertension: Secondary | ICD-10-CM | POA: Diagnosis not present

## 2024-01-10 LAB — POC COVID19/FLU A&B COMBO
Covid Antigen, POC: NEGATIVE
Influenza A Antigen, POC: NEGATIVE
Influenza B Antigen, POC: NEGATIVE

## 2024-01-10 MED ORDER — ALBUTEROL SULFATE HFA 108 (90 BASE) MCG/ACT IN AERS
2.0000 | INHALATION_SPRAY | RESPIRATORY_TRACT | 2 refills | Status: AC | PRN
Start: 2024-01-10 — End: ?

## 2024-01-10 MED ORDER — PREDNISONE 10 MG PO TABS
ORAL_TABLET | ORAL | 0 refills | Status: DC
Start: 2024-01-10 — End: 2024-03-24

## 2024-01-10 NOTE — Progress Notes (Signed)
Subjective:    Patient ID: Amanda Wang, female    DOB: 09/08/1951, 73 y.o.   MRN: [id number]  Amanda Wang is a 73 y.o. female presenting on 01/10/2024 for Cough and Wheezing   HPI  Discussed the use of AI scribe software for clinical note transcription with the patient, who gave verbal consent to proceed.  History of Present Illness     The patient, with a history of respiratory issues, presents with a dry cough and chest tightness that began two days prior. She describes the cough as unproductive and associated with a sensation of 'crackling' in the lungs, particularly when lying down. The patient also reports shortness of breath, both at rest and during activity. She denies any fluid retention or swelling. - She needs new order on albuterol inhaler  In addition to these respiratory symptoms, the patient has been experiencing fever since the onset of the cough. She has not undergone any home testing for COVID-19 or influenza. The patient has not been in contact with any known COVID-19 cases recently, with the last potential exposure being over two weeks ago.  The patient also mentions a history of high blood pressure and says has not been adhering to med regimen.  The patient has previously experienced similar respiratory symptoms, with the last episode occurring in June. She does not currently use a maintenance inhaler. She has a history of smoking, but quit a long time ago. The patient was previously on a blood thinner following surgery due to a clot, but is no longer taking it. She also stopped taking Buspirone, as she felt better without it.            12/13/2023   11:04 AM 06/26/2023    2:20 PM 02/23/2023    4:30 PM  Depression screen PHQ 2/9  Decreased Interest 1 0 1  Down, Depressed, Hopeless 0 0 0  PHQ - 2 Score 1 0 1  Altered sleeping 0 0 1  Tired, decreased energy 1 2 2   Change in appetite 0 0 2  Feeling bad or failure about yourself  0 0 0  Trouble  concentrating 0 0 0  Moving slowly or fidgety/restless 0 0 0  Suicidal thoughts 0 0 0  PHQ-9 Score 2 2 6   Difficult doing work/chores Somewhat difficult  Somewhat difficult       12/13/2023   11:04 AM 06/26/2023    2:21 PM 02/23/2023    4:31 PM 02/07/2023    5:09 PM  GAD 7 : Generalized Anxiety Score  Nervous, Anxious, on Edge 1 0 1 1  Control/stop worrying 1 0 1 1  Worry too much - different things 1 0 1 1  Trouble relaxing 0 0 0 1  Restless 0 0 0 0  Easily annoyed or irritable 0 0 0 0  Afraid - awful might happen 0 0 0 0  Total GAD 7 Score 3 0 3 4  Anxiety Difficulty  Not difficult at all Not difficult at all Not difficult at all    Social History   Tobacco Use   Smoking status: Former    Current packs/day: 0.00    Average packs/day: 0.5 packs/day for 1 year (0.5 ttl pk-yrs)    Types: Cigarettes    Start date: 07/26/1976    Quit date: 07/26/1977    Years since quitting: 46.4    Passive exposure: Never   Smokeless tobacco: Never   Tobacco comments:    barley smoked when smoked  Vaping Use   Vaping status: Never Used  Substance Use Topics   Alcohol use: Yes    Alcohol/week: 0.0 standard drinks of alcohol    Comment: on occasion 1-2 x month   Drug use: No    Review of Systems Per HPI unless specifically indicated above     Objective:    BP (!) 160/90 (BP Location: Left Arm, Cuff Size: Normal)   Pulse 92   Ht 5\' 2"  (1.575 m)   Wt 168 lb (76.2 kg)   SpO2 98%   BMI 30.73 kg/m   Wt Readings from Last 3 Encounters:  01/10/24 168 lb (76.2 kg)  12/13/23 173 lb (78.5 kg)  10/09/23 177 lb (80.3 kg)    Physical Exam Vitals and nursing note reviewed.  Constitutional:      General: She is not in acute distress.    Appearance: She is well-developed. She is not diaphoretic.     Comments: Well-appearing, comfortable, cooperative  HENT:     Head: Normocephalic and atraumatic.  Eyes:     General:        Right eye: No discharge.        Left eye: No discharge.      Conjunctiva/sclera: Conjunctivae normal.  Neck:     Thyroid: No thyromegaly.  Cardiovascular:     Rate and Rhythm: Normal rate and regular rhythm.     Heart sounds: Normal heart sounds. No murmur heard. Pulmonary:     Effort: Pulmonary effort is normal. No respiratory distress.     Breath sounds: Wheezing present. No rales.     Comments: cough Musculoskeletal:        General: Normal range of motion.     Cervical back: Normal range of motion and neck supple.  Lymphadenopathy:     Cervical: No cervical adenopathy.  Skin:    General: Skin is warm and dry.     Findings: No erythema or rash.  Neurological:     Mental Status: She is alert and oriented to person, place, and time.  Psychiatric:        Behavior: Behavior normal.     Comments: Well groomed, good eye contact, normal speech and thoughts     Results for orders placed or performed in visit on 01/10/24  POC Covid19/Flu A&B Antigen   Collection Time: 01/10/24  3:43 PM  Result Value Ref Range   Influenza A Antigen, POC Negative Negative   Influenza B Antigen, POC Negative Negative   Covid Antigen, POC Negative Negative      Assessment & Plan:   Problem List Items Addressed This Visit     Essential hypertension   Other Visit Diagnoses       Bronchospasm    -  Primary   Relevant Medications   albuterol (VENTOLIN HFA) 108 (90 Base) MCG/ACT inhaler   predniSONE (DELTASONE) 10 MG tablet     Shortness of breath       Relevant Medications   albuterol (VENTOLIN HFA) 108 (90 Base) MCG/ACT inhaler   predniSONE (DELTASONE) 10 MG tablet     Viral URI with cough       Relevant Orders   POC Covid19/Flu A&B Antigen (Completed)        Acute Bronchospasm reaction Suspected viral URI -Perform COVID-19 swab test due to recent exposure and current symptoms. Result is NEGATIVE for both today  Dry cough, wheezing, and shortness of breath started two days ago. No fluid retention or swelling. Fever present. Possible post-viral  asthma-like reaction. -Order Albuterol inhaler, two puffs every four hours as needed. -Start Prednisone taper.  Concern elevated BP but she did NOT take meds today or the day before, seems acute worsening with coughing and acute illness. Recently home reading was better when she took medicine. Advise to take meds later today. Monitor closely. Caution with prednisone.  Medication Reconciliation Patient no longer taking Eliquis or Buspirone. -Remove Eliquis and Buspirone from medication list.  General Health Maintenance -Avoid NSAIDs while on Prednisone. Tylenol is safe to use.         Orders Placed This Encounter  Procedures   POC Covid19/Flu A&B Antigen    Meds ordered this encounter  Medications   albuterol (VENTOLIN HFA) 108 (90 Base) MCG/ACT inhaler    Sig: Inhale 2 puffs into the lungs every 4 (four) hours as needed for wheezing or shortness of breath.    Dispense:  8 g    Refill:  2   predniSONE (DELTASONE) 10 MG tablet    Sig: Take 6 tabs with breakfast Day 1, 5 tabs Day 2, 4 tabs Day 3, 3 tabs Day 4, 2 tabs Day 5, 1 tab Day 6.    Dispense:  21 tablet    Refill:  0    Follow up plan: Return if symptoms worsen or fail to improve.   Saralyn Pilar, DO [facility] 01/10/2024, 11:10 AM

## 2024-01-10 NOTE — Patient Instructions (Addendum)
Thank you for coming to the office today.  Start Albuterol rescue inhaler for coughing wheezing 2 puff every 4 hour as needed.  Prednisone taper 6 days  Hold ibuprofen advil  Okay to take Tylenol.   Please schedule a Follow-up Appointment to: Return if symptoms worsen or fail to improve.  If you have any other questions or concerns, please feel free to call the office or send a message through MyChart. You may also schedule an earlier appointment if necessary.  Additionally, you may be receiving a survey about your experience at our office within a few days to 1 week by e-mail or mail. We value your feedback.  Saralyn Pilar, DO [facility], [place]

## 2024-01-10 NOTE — Telephone Encounter (Signed)
  Chief Complaint: wheezing Symptoms: wheezing, dry cough, fever, SOB Frequency: Tuesday  Pertinent Negatives: NA Disposition: [] ED /[] Urgent Care (no appt availability in office) / [x] Appointment(In office/virtual)/ []  [place] [facility]/ [] Home Care/ [] Refused Recommended Disposition /[] [place] [facility]/ []  Follow-up with PCP Additional Notes: pt states she feels like her lungs are crackling and has wheezing and SOB with activity or rest at times. Has not used her albuterol inhaler, unsure if still in date or not. Scheduled OV today at 1040 with PCP.   Reason for Disposition  [1] MILD difficulty breathing (e.g., minimal/no SOB at rest, SOB with walking, pulse <100) AND [2] NEW-onset or WORSE than normal  Answer Assessment - Initial Assessment Questions 1. RESPIRATORY STATUS: "Describe your breathing?" (e.g., wheezing, shortness of breath, unable to speak, severe coughing)      Wheezing  2. ONSET: "When did this breathing problem begin?"      Tuesday  3. PATTERN "Does the difficult breathing come and go, or has it been constant since it started?"      Comes and goes worse when laying in bed  4. SEVERITY: "How bad is your breathing?" (e.g., mild, moderate, severe)    - MILD: No SOB at rest, mild SOB with walking, speaks normally in sentences, can lie down, no retractions, pulse < 100.    - MODERATE: SOB at rest, SOB with minimal exertion and prefers to sit, cannot lie down flat, speaks in phrases, mild retractions, audible wheezing, pulse 100-120.    - SEVERE: Very SOB at rest, speaks in single words, struggling to breathe, sitting hunched forward, retractions, pulse > 120      Mild  9. OTHER SYMPTOMS: "Do you have any other symptoms? (e.g., dizziness, runny nose, cough, chest pain, fever)     Dry cough, fever since Tuesday but broke this morning  Protocols used: Breathing Difficulty-A-AH
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
## 2024-01-23 ENCOUNTER — Telehealth: Payer: Medicare PPO | Admitting: Psychiatry

## 2024-01-23 ENCOUNTER — Telehealth: Payer: Self-pay | Admitting: Psychiatry

## 2024-01-23 NOTE — Telephone Encounter (Signed)
 Noted, thanks!

## 2024-01-30 ENCOUNTER — Other Ambulatory Visit: Payer: Self-pay | Admitting: Family Medicine

## 2024-01-30 DIAGNOSIS — Z1231 Encounter for screening mammogram for malignant neoplasm of breast: Secondary | ICD-10-CM

## 2024-01-31 ENCOUNTER — Ambulatory Visit
Admission: RE | Admit: 2024-01-31 | Discharge: 2024-01-31 | Disposition: A | Discharge status: Home / Self Care | Payer: Medicare PPO | Source: Ambulatory Visit | Attending: Family Medicine | Admitting: Family Medicine

## 2024-01-31 DIAGNOSIS — Z1231 Encounter for screening mammogram for malignant neoplasm of breast: Secondary | ICD-10-CM | POA: Insufficient documentation

## 2024-02-03 ENCOUNTER — Other Ambulatory Visit: Payer: Self-pay | Admitting: Family Medicine

## 2024-02-03 DIAGNOSIS — K219 Gastro-esophageal reflux disease without esophagitis: Secondary | ICD-10-CM

## 2024-02-03 DIAGNOSIS — G935 Compression of brain: Secondary | ICD-10-CM

## 2024-02-03 DIAGNOSIS — G2581 Restless legs syndrome: Secondary | ICD-10-CM

## 2024-02-04 NOTE — Telephone Encounter (Signed)
Requested medication (s) are due for refill today: unsure  Requested medication (s) are on the active medication list: yes and no  Last refill:  keppra 06/11/23, gabapentin   Future visit scheduled: yes  Notes to clinic:  Unable to refill per protocol, last refill by another provider.      Requested Prescriptions  Pending Prescriptions Disp Refills   levETIRAcetam (KEPPRA) 500 MG tablet [Pharmacy Med Name: LEVETIRACETAM 500MG  TABLETS] 180 tablet     Sig: TAKE 1 TABLET(500 MG) BY MOUTH TWICE DAILY     Neurology:  Anticonvulsants - levetiracetam Passed - 02/04/2024  3:09 PM      Passed - Cr in normal range and within 360 days    Creatinine  Date Value Ref Range Status  01/11/2021 54.2 20.0 - 300.0 mg/dL Final   Creat  Date Value Ref Range Status  06/26/2023 0.66 0.60 - 1.00 mg/dL Final         Passed - Completed PHQ-2 or PHQ-9 in the last 360 days      Passed - Valid encounter within last 12 months    Recent Outpatient Visits           3 weeks ago Bronchospasm   [place] [facility] Smitty Cords, DO   1 month ago Anxiety   [place] [facility] Smitty Cords, DO   5 months ago Essential hypertension   [place] [facility] Smitty Cords, DO   6 months ago Essential hypertension   [place] Smitty Cords, DO   7 months ago Essential hypertension   [place] [facility] [place], Salvadore Oxford, NP       Future Appointments             In 1 month Althea Charon, Netta Neat, DO [place] [facility]             gabapentin (NEURONTIN) 600 MG tablet [Pharmacy Med Name: GABAPENTIN 600MG  TABLETS] 90 tablet 3    Sig: TAKE 1 TABLET(600 MG) BY MOUTH AT BEDTIME     Neurology: Anticonvulsants - gabapentin Passed - 02/04/2024  3:09 PM      Passed - Cr in normal range and within 360 days    Creatinine  Date Value  Ref Range Status  01/11/2021 54.2 20.0 - 300.0 mg/dL Final   Creat  Date Value Ref Range Status  06/26/2023 0.66 0.60 - 1.00 mg/dL Final         Passed - Completed PHQ-2 or PHQ-9 in the last 360 days      Passed - Valid encounter within last 12 months    Recent Outpatient Visits           3 weeks ago Bronchospasm   [place] [facility] Smitty Cords, DO   1 month ago Anxiety   [place] [facility] Smitty Cords, DO   5 months ago Essential hypertension   [place] [facility] Smitty Cords, DO   6 months ago Essential hypertension   [place] [facility] Smitty Cords, DO   7 months ago Essential hypertension   [place] [facility] [place], Salvadore Oxford, NP       Future Appointments             In 1 month Althea Charon, Netta Neat, DO [facility]            Signed Prescriptions Disp Refills   omeprazole (PRILOSEC) 40 MG capsule 90 capsule 0    Sig: TAKE 1 CAPSULE(40 MG) BY MOUTH DAILY BEFORE BREAKFAST     Gastroenterology: Proton Pump Inhibitors Passed - 02/04/2024  3:09 PM      Passed - Valid encounter within last 12 months    Recent Outpatient Visits           3 weeks ago Bronchospasm   [place] [facility] [place], Netta Neat, DO   1 month ago Anxiety   [place] [facility] Smitty Cords, DO   5 months ago Essential hypertension   [place] [facility] Smitty Cords, DO   6 months ago Essential hypertension   [place] [facility] Smitty Cords, DO   7 months ago Essential hypertension   [place] [facility] [place], Salvadore Oxford, NP       Future Appointments             In 1 month Althea Charon, Netta Neat, DO [place] [facility], [place]

## 2024-02-04 NOTE — Telephone Encounter (Signed)
Requested Prescriptions  Pending Prescriptions Disp Refills   levETIRAcetam (KEPPRA) 500 MG tablet [Pharmacy Med Name: LEVETIRACETAM 500MG  TABLETS] 180 tablet     Sig: TAKE 1 TABLET(500 MG) BY MOUTH TWICE DAILY     Neurology:  Anticonvulsants - levetiracetam Passed - 02/04/2024  3:08 PM      Passed - Cr in normal range and within 360 days    Creatinine  Date Value Ref Range Status  01/11/2021 54.2 20.0 - 300.0 mg/dL Final   Creat  Date Value Ref Range Status  06/26/2023 0.66 0.60 - 1.00 mg/dL Final         Passed - Completed PHQ-2 or PHQ-9 in the last 360 days      Passed - Valid encounter within last 12 months    Recent Outpatient Visits           3 weeks ago Bronchospasm   [place] [facility] Smitty Cords, DO   1 month ago Anxiety   [place] [facility] Smitty Cords, DO   5 months ago Essential hypertension   [place] [facility] Smitty Cords, DO   6 months ago Essential hypertension   [place] [facility] Smitty Cords, DO   7 months ago Essential hypertension   [place] [facility] [place], Salvadore Oxford, NP       Future Appointments             In 1 month Althea Charon, Netta Neat, DO [place] [facility]             omeprazole (PRILOSEC) 40 MG capsule [Pharmacy Med Name: OMEPRAZOLE 40MG  CAPSULES] 90 capsule 3    Sig: TAKE 1 CAPSULE(40 MG) BY MOUTH DAILY BEFORE BREAKFAST     Gastroenterology: Proton Pump Inhibitors Passed - 02/04/2024  3:08 PM      Passed - Valid encounter within last 12 months    Recent Outpatient Visits           3 weeks ago Bronchospasm   [place] [facility] [place], Netta Neat, DO   1 month ago Anxiety   [facility] Smitty Cords, DO   5 months ago Essential hypertension   [place] [facility]  Smitty Cords, DO   6 months ago Essential hypertension   [place] [facility] Smitty Cords, DO   7 months ago Essential hypertension   [place] [facility] [place], Salvadore Oxford, NP       Future Appointments             In 1 month Althea Charon, Netta Neat, DO  [facility]             gabapentin (NEURONTIN) 600 MG tablet [Pharmacy Med Name: GABAPENTIN 600MG  TABLETS] 90 tablet 3    Sig: TAKE 1 TABLET(600 MG) BY MOUTH AT BEDTIME     Neurology: Anticonvulsants - gabapentin Passed - 02/04/2024  3:08 PM      Passed - Cr in normal range and within 360 days    Creatinine  Date Value Ref Range Status  01/11/2021 54.2 20.0 - 300.0 mg/dL Final   Creat  Date Value Ref Range Status  06/26/2023 0.66 0.60 - 1.00 mg/dL Final         Passed - Completed PHQ-2 or PHQ-9 in the last  360 days      Passed - Valid encounter within last 12 months    Recent Outpatient Visits           3 weeks ago Bronchospasm   [place] [facility] [place], Netta Neat, DO   1 month ago Anxiety   [place] [facility] Smitty Cords, DO   5 months ago Essential hypertension   [place] [facility] Smitty Cords, DO   6 months ago Essential hypertension   [place] [facility] Smitty Cords, DO   7 months ago Essential hypertension   [place] [facility], Salvadore Oxford, NP       Future Appointments             In 1 month Althea Charon, Netta Neat, DO  [facility], [place]

## 2024-02-18 ENCOUNTER — Other Ambulatory Visit: Payer: Self-pay | Admitting: Family Medicine

## 2024-02-18 DIAGNOSIS — I10 Essential (primary) hypertension: Secondary | ICD-10-CM

## 2024-02-18 NOTE — Telephone Encounter (Unsigned)
 Copied from CRM [id number]. Topic: Clinical - Medication Refill >> Feb 18, 2024  2:40 PM Maxwell Marion wrote: Most Recent Primary Care Visit:  Provider: Smitty Cords  Department[id number]  Visit Type: SAME DAY  Date: 01/10/2024  Medication: carvedilol (COREG) 6.25 MG tablet  Has the patient contacted their pharmacy? Yes (Agent: If no, request that the patient contact the pharmacy for the refill. If patient does not wish to contact the pharmacy document the reason why and proceed with request.) (Agent: If yes, when and what did the pharmacy advise?) Pharmacy advised patient to contact doctor's office because they were not seeing medication in their system  Is this the correct pharmacy for this prescription? Yes If no, delete pharmacy and type the correct one.  This is the patient's preferred pharmacy:  [id number] - [place], [place] - [address] AT [facility] OF [address] [place] [address] [place] [address] Phone: [phone] Fax: [phone]   Has the prescription been filled recently? No  Is the patient out of the medication? Yes  Has the patient been seen for an appointment in the last year OR does the patient have an upcoming appointment? Yes  Can we respond through MyChart? Yes  Agent: Please be advised that Rx refills may take up to 3 business days. We ask that you follow-up with your pharmacy.

## 2024-02-19 MED ORDER — CARVEDILOL 6.25 MG PO TABS
6.2500 mg | ORAL_TABLET | Freq: Two times a day (BID) | ORAL | 0 refills | Status: DC
Start: 2024-02-19 — End: 2024-03-24

## 2024-02-19 NOTE — Telephone Encounter (Signed)
 Requested Prescriptions  Pending Prescriptions Disp Refills   carvedilol (COREG) 6.25 MG tablet 180 tablet 0    Sig: Take 1 tablet (6.25 mg total) by mouth 2 (two) times daily with a meal.     Cardiovascular: Beta Blockers 3 Failed - 02/19/2024  4:13 PM      Failed - Last BP in normal range    BP Readings from Last 1 Encounters:  01/10/24 (!) 160/90         Passed - Cr in normal range and within 360 days    Creatinine  Date Value Ref Range Status  01/11/2021 54.2 20.0 - 300.0 mg/dL Final   Creat  Date Value Ref Range Status  06/26/2023 0.66 0.60 - 1.00 mg/dL Final         Passed - AST in normal range and within 360 days    AST  Date Value Ref Range Status  06/10/2023 19 15 - 41 U/L Final   SGOT(AST)  Date Value Ref Range Status  07/24/2012 26 15 - 37 Unit/L Final   AST (SGOT) Piccolo, Waived  Date Value Ref Range Status  03/07/2018 26 11 - 38 U/L Final         Passed - ALT in normal range and within 360 days    ALT  Date Value Ref Range Status  06/10/2023 13 0 - 44 U/L Final   SGPT (ALT)  Date Value Ref Range Status  07/24/2012 26 12 - 78 U/L Final   ALT (SGPT) Piccolo, Waived  Date Value Ref Range Status  03/07/2018 23 10 - 47 U/L Final         Passed - Last Heart Rate in normal range    Pulse Readings from Last 1 Encounters:  01/10/24 92         Passed - Valid encounter within last 6 months    Recent Outpatient Visits           1 month ago Bronchospasm   [place] [facility] Smitty Cords, DO   2 months ago Anxiety   [place] [facility] Smitty Cords, DO   6 months ago Essential hypertension   [place] [facility] Smitty Cords, DO   7 months ago Essential hypertension   [place] [facility] Smitty Cords, DO   7 months ago Essential hypertension   [place] [facility] [place], Salvadore [place], [place]

## 2024-02-25 ENCOUNTER — Other Ambulatory Visit: Payer: Self-pay | Admitting: Family Medicine

## 2024-02-25 DIAGNOSIS — E782 Mixed hyperlipidemia: Secondary | ICD-10-CM

## 2024-02-26 NOTE — Telephone Encounter (Signed)
 Requested medication (s) are due for refill today - expired Rx  Requested medication (s) are on the active medication list -yes  Future visit scheduled -yes  Last refill: 02/07/23 #90 3RF  Notes to clinic: fails lab protocol- over 1 year- 02/20/23  Requested Prescriptions  Pending Prescriptions Disp Refills   atorvastatin (LIPITOR) 40 MG tablet [Pharmacy Med Name: ATORVASTATIN 40MG  TABLETS] 90 tablet 3    Sig: TAKE 1 TABLET(40 MG) BY MOUTH AT BEDTIME     Cardiovascular:  Antilipid - Statins Failed - 02/26/2024 11:01 AM      Failed - Lipid Panel in normal range within the last 12 months    Cholesterol, Total  Date Value Ref Range Status  05/31/2021 158 100 - 199 mg/dL Final   Cholesterol  Date Value Ref Range Status  02/20/2023 134 <200 mg/dL Final   Cholesterol Piccolo, Waived  Date Value Ref Range Status  03/07/2018 175 <200 mg/dL Final    Comment:                            Desirable                <200                         Borderline High      200- 239                         High                     >239    LDL Cholesterol (Calc)  Date Value Ref Range Status  02/20/2023 65 mg/dL (calc) Final    Comment:    Reference range: <100 . Desirable range <100 mg/dL for primary prevention;   <70 mg/dL for patients with CHD or diabetic patients  with > or = 2 CHD risk factors. Marland Kitchen LDL-C is now calculated using the Martin-Hopkins  calculation, which is a validated novel method providing  better accuracy than the Friedewald equation in the  estimation of LDL-C.  Horald Pollen et al. Lenox Ahr. 5621;308(65): 2061-2068  (http://education.[facility].com/faq/FAQ164)    HDL  Date Value Ref Range Status  02/20/2023 48 (L) > OR = 50 mg/dL Final  78/46/9629 62 >52 mg/dL Final   Triglycerides  Date Value Ref Range Status  02/20/2023 119 <150 mg/dL Final   Triglycerides Piccolo,Waived  Date Value Ref Range Status  03/07/2018 164 (H) <150 mg/dL Final    Comment:                             Normal                   <150                         Borderline High     150 - 199                         High                200 - 499                         Very High                >  62          Passed - Patient is not pregnant      Passed - Valid encounter within last 12 months    Recent Outpatient Visits           1 month ago Bronchospasm   [place] [facility] [place], Netta Neat, DO   2 months ago Anxiety   [place] [facility] Smitty Cords, DO   6 months ago Essential hypertension   [place] [facility] Smitty Cords, DO   7 months ago Essential hypertension   [place] [facility] Smitty Cords, DO   7 months ago Essential hypertension   [place] [facility], Salvadore [place], NP                 Requested Prescriptions  Pending Prescriptions Disp Refills   atorvastatin (LIPITOR) 40 MG tablet [Pharmacy Med Name: ATORVASTATIN 40MG  TABLETS] 90 tablet 3    Sig: TAKE 1 TABLET(40 MG) BY MOUTH AT BEDTIME     Cardiovascular:  Antilipid - Statins Failed - 02/26/2024 11:01 AM      Failed - Lipid Panel in normal range within the last 12 months    Cholesterol, Total  Date Value Ref Range Status  05/31/2021 158 100 - 199 mg/dL Final   Cholesterol  Date Value Ref Range Status  02/20/2023 134 <200 mg/dL Final   Cholesterol Piccolo, [place]  Date Value Ref Range Status  03/07/2018 175 <200 mg/dL Final    Comment:                            Desirable                <200                         Borderline High      200- 239                         High                     >239    LDL Cholesterol (Calc)  Date Value Ref Range Status  02/20/2023 65 mg/dL (calc) Final    Comment:    Reference range: <100 . Desirable range <100 mg/dL for primary prevention;   <70 mg/dL for patients with CHD or diabetic patients  with > or  = 2 CHD risk factors. Marland Kitchen LDL-C is now calculated using the Martin-Hopkins  calculation, which is a validated novel method providing  better accuracy than the Friedewald equation in the  estimation of LDL-C.  Horald Pollen et al. Lenox Ahr. 6387;564(33): 2061-2068  (http://education.[facility].com/faq/FAQ164)    HDL  Date Value Ref Range Status  02/20/2023 48 (L) > OR = 50 mg/dL Final  29/51/8841 62 >66 mg/dL Final   Triglycerides  Date Value Ref Range Status  02/20/2023 119 <150 mg/dL Final   Triglycerides Piccolo,Waived  Date Value Ref Range Status  03/07/2018 164 (H) <150 mg/dL Final    Comment:                            Normal                   <  150                         Borderline High     150 - 199                         High                200 - 499                         Very High                >499          Passed - Patient is not pregnant      Passed - Valid encounter within last 12 months    Recent Outpatient Visits           1 month ago Bronchospasm   [place] [facility] [place], Netta Neat, DO   2 months ago Anxiety   [place] [facility] Smitty Cords, DO   6 months ago Essential hypertension   [place] [facility] Smitty Cords, DO   7 months ago Essential hypertension   [place] [facility] Smitty Cords, DO   7 months ago Essential hypertension    [facility] [place], Salvadore [place], [place]

## 2024-03-02 ENCOUNTER — Other Ambulatory Visit: Payer: Self-pay | Admitting: Psychiatry

## 2024-03-13 ENCOUNTER — Other Ambulatory Visit: Payer: Self-pay

## 2024-03-14 ENCOUNTER — Other Ambulatory Visit

## 2024-03-15 LAB — LIPID PANEL
Cholesterol: 156 mg/dL (ref <200)
HDL: 52 mg/dL (ref >=50)
LDL Cholesterol (Calc): 81 mg/dL
Non-HDL Cholesterol (Calc): 104 mg/dL (ref <130)
Total CHOL/HDL Ratio: 3 (calc) (ref <5.0)
Triglycerides: 130 mg/dL (ref <150)

## 2024-03-15 LAB — CBC WITH DIFFERENTIAL/PLATELET
Absolute Lymphocytes: 1152 {cells}/uL (ref 850–3900)
Absolute Monocytes: 358 {cells}/uL (ref 200–950)
Basophils Absolute: 32 {cells}/uL (ref 0–200)
Basophils Relative: 0.5 %
Eosinophils Absolute: 262 {cells}/uL (ref 15–500)
Eosinophils Relative: 4.1 %
HCT: 36.2 % (ref 35.0–45.0)
Hemoglobin: 11.2 g/dL — ABNORMAL LOW (ref 11.7–15.5)
MCH: 26.2 pg — ABNORMAL LOW (ref 27.0–33.0)
MCHC: 30.9 g/dL — ABNORMAL LOW (ref 32.0–36.0)
MCV: 84.6 fL (ref 80.0–100.0)
MPV: 10.1 fL (ref 7.5–12.5)
Monocytes Relative: 5.6 %
Neutro Abs: 4595 {cells}/uL (ref 1500–7800)
Neutrophils Relative %: 71.8 %
Platelets: 292 10*3/uL (ref 140–400)
RBC: 4.28 10*6/uL (ref 3.80–5.10)
RDW: 14.8 % (ref 11.0–15.0)
Total Lymphocyte: 18 %
WBC: 6.4 10*3/uL (ref 3.8–10.8)

## 2024-03-15 LAB — COMPLETE METABOLIC PANEL WITHOUT GFR
AG Ratio: 1.5 (calc) (ref 1.0–2.5)
ALT: 7 U/L (ref 6–29)
AST: 12 U/L (ref 10–35)
Albumin: 3.9 g/dL (ref 3.6–5.1)
Alkaline phosphatase (APISO): 118 U/L (ref 37–153)
BUN: 15 mg/dL (ref 7–25)
CO2: 31 mmol/L (ref 20–32)
Calcium: 8.7 mg/dL (ref 8.6–10.4)
Chloride: 103 mmol/L (ref 98–110)
Creat: 0.65 mg/dL (ref 0.60–1.00)
Globulin: 2.6 g/dL (ref 1.9–3.7)
Glucose, Bld: 139 mg/dL — ABNORMAL HIGH (ref 65–99)
Potassium: 3.7 mmol/L (ref 3.5–5.3)
Sodium: 143 mmol/L (ref 135–146)
Total Bilirubin: 0.5 mg/dL (ref 0.2–1.2)
Total Protein: 6.5 g/dL (ref 6.1–8.1)

## 2024-03-15 LAB — HEMOGLOBIN A1C
Hgb A1c MFr Bld: 7.3 %{Hb} — ABNORMAL HIGH (ref <5.7)
Mean Plasma Glucose: 163 mg/dL
eAG (mmol/L): 9 mmol/L

## 2024-03-15 LAB — TSH: TSH: 2.58 m[IU]/L (ref 0.40–4.50)

## 2024-03-15 LAB — MICROALBUMIN / CREATININE URINE RATIO
Creatinine, Urine: 60 mg/dL (ref 20–275)
Microalb Creat Ratio: 22 mg/g{creat} (ref <30)
Microalb, Ur: 1.3 mg/dL

## 2024-03-15 LAB — VITAMIN D 25 HYDROXY (VIT D DEFICIENCY, FRACTURES): Vit D, 25-Hydroxy: 39 ng/mL (ref 30–100)

## 2024-03-20 ENCOUNTER — Encounter: Payer: Medicare PPO | Admitting: Family Medicine

## 2024-03-21 ENCOUNTER — Other Ambulatory Visit: Payer: Self-pay | Admitting: Family Medicine

## 2024-03-21 DIAGNOSIS — I10 Essential (primary) hypertension: Secondary | ICD-10-CM

## 2024-03-21 NOTE — Telephone Encounter (Signed)
 Requested Prescriptions  Pending Prescriptions Disp Refills   valsartan (DIOVAN) 320 MG tablet [Pharmacy Med Name: VALSARTAN 320MG  TABLETS] 90 tablet 0    Sig: TAKE 1 TABLET(320 MG) BY MOUTH DAILY     Cardiovascular:  Angiotensin Receptor Blockers Failed - 03/21/2024  4:02 PM      Failed - Last BP in normal range    BP Readings from Last 1 Encounters:  01/10/24 (!) 160/90         Failed - Valid encounter within last 6 months    Recent Outpatient Visits   None            Passed - Cr in normal range and within 180 days    Creatinine  Date Value Ref Range Status  01/11/2021 54.2 20.0 - 300.0 mg/dL Final   Creat  Date Value Ref Range Status  03/14/2024 0.65 0.60 - 1.00 mg/dL Final   Creatinine, Urine  Date Value Ref Range Status  03/14/2024 60 20 - 275 mg/dL Final         Passed - K in normal range and within 180 days    Potassium  Date Value Ref Range Status  03/14/2024 3.7 3.5 - 5.3 mmol/L Final  07/24/2012 4.2 3.5 - 5.1 mmol/L Final         Passed - Patient is not pregnant

## 2024-03-24 ENCOUNTER — Ambulatory Visit: Admitting: Family Medicine

## 2024-03-24 ENCOUNTER — Encounter: Payer: Self-pay | Admitting: Family Medicine

## 2024-03-24 VITALS — BP 138/88 | HR 66 | Ht 62.0 in | Wt 172.0 lb

## 2024-03-24 DIAGNOSIS — M25562 Pain in left knee: Secondary | ICD-10-CM

## 2024-03-24 DIAGNOSIS — I10 Essential (primary) hypertension: Secondary | ICD-10-CM | POA: Diagnosis not present

## 2024-03-24 DIAGNOSIS — F419 Anxiety disorder, unspecified: Secondary | ICD-10-CM

## 2024-03-24 DIAGNOSIS — K219 Gastro-esophageal reflux disease without esophagitis: Secondary | ICD-10-CM

## 2024-03-24 DIAGNOSIS — E785 Hyperlipidemia, unspecified: Secondary | ICD-10-CM

## 2024-03-24 DIAGNOSIS — E1169 Type 2 diabetes mellitus with other specified complication: Secondary | ICD-10-CM

## 2024-03-24 DIAGNOSIS — F339 Major depressive disorder, recurrent, unspecified: Secondary | ICD-10-CM

## 2024-03-24 DIAGNOSIS — F4310 Post-traumatic stress disorder, unspecified: Secondary | ICD-10-CM

## 2024-03-24 DIAGNOSIS — D508 Other iron deficiency anemias: Secondary | ICD-10-CM

## 2024-03-24 DIAGNOSIS — G8929 Other chronic pain: Secondary | ICD-10-CM

## 2024-03-24 DIAGNOSIS — K580 Irritable bowel syndrome with diarrhea: Secondary | ICD-10-CM

## 2024-03-24 DIAGNOSIS — Z0000 Encounter for general adult medical examination without abnormal findings: Secondary | ICD-10-CM

## 2024-03-24 DIAGNOSIS — E559 Vitamin D deficiency, unspecified: Secondary | ICD-10-CM

## 2024-03-24 MED ORDER — COLESTIPOL HCL 5 G PO GRAN: 5.0000 g | GRANULES | Freq: Two times a day (BID) | ORAL | 2 refills | Status: DC

## 2024-03-24 MED ORDER — VALSARTAN 320 MG PO TABS: 320.0000 mg | ORAL_TABLET | Freq: Every day | ORAL | 3 refills | Status: AC

## 2024-03-24 MED ORDER — OMEPRAZOLE 40 MG PO CPDR: 40.0000 mg | DELAYED_RELEASE_CAPSULE | Freq: Every day | ORAL | 3 refills | Status: AC

## 2024-03-24 MED ORDER — COLESTIPOL HCL 5 G PO GRAN: 5.0000 g | GRANULES | Freq: Two times a day (BID) | ORAL | 12 refills | Status: DC

## 2024-03-24 MED ORDER — CARVEDILOL 6.25 MG PO TABS: 6.2500 mg | ORAL_TABLET | Freq: Two times a day (BID) | ORAL | 3 refills | Status: DC

## 2024-03-24 MED ORDER — VILAZODONE HCL 40 MG PO TABS: 40.0000 mg | ORAL_TABLET | Freq: Every day | ORAL | 1 refills | Status: DC

## 2024-03-24 MED ORDER — LORAZEPAM 1 MG PO TABS: 0.5000 mg | ORAL_TABLET | Freq: Every day | ORAL | 2 refills | Status: AC | PRN

## 2024-03-24 MED ORDER — CYCLOBENZAPRINE HCL 10 MG PO TABS: 10.0000 mg | ORAL_TABLET | Freq: Three times a day (TID) | ORAL | 3 refills | Status: AC | PRN

## 2024-03-24 NOTE — Progress Notes (Signed)
 Subjective:    Patient ID: Ruben Gottron, female    DOB: 12-28-1950, 73 y.o.   MRN: [id number]  ANGELLEE COHILL is a 73 y.o. female presenting on 03/24/2024 for Annual Exam   HPI  Discussed the use of AI scribe software for clinical note transcription with the patient, who gave verbal consent to proceed.  History of Present Illness   The patient is a 73 year old who presents for an annual physical exam.    Vitamin D deficiency Last lab reduced but still normal Will resume Vit D3 5k daily  Major Depression recurrent  Generalized Anxiety / Panic Attacks Followed by [facility] Recent updates since last visit she was taken OFF Buspar which was causing Serotonin syndrome Continues Viibryd 40mg  On Lorazepam 1mg  taking half tab AS NEEDED, usually 30 pills x 2 would last up to 6 months. She has been taking Lorazepam more regularly due to nightmares and sleep, and then has improved, has reduced doses of Lorazepam   CHRONIC HTN: BP has been controlled Previously OFF Amlodipine d/t leg swelling. And OFF hydrochlorothiazide due to muscle cramps Meds - currently on Valsartan 320mg  daily, Carvedilol 6.25mg  TWICE A DAY  Type 2 Diabetes, new diagnosis A1c up to 7.3 now on recent lab. Has seen Dietician She prefers to keep Dr Reino Kent soda She would check sugar occasional but now not checking Diabetic Eye Exam in July 2025  Hyperlipidemia On Atorvastatin  IDA, symptomatic anemia, microcytic Malabsorption S/p Gastric Sleeve Surgery History of bariatric surgery. did well initially and did not gain weight - Then she got nausea and vomiting - she developed hiatal hernia, she would only be able to eat small bites and then would get sick with nausea vomiting  Dx with SIBO - small intestinal bacterial overgrowth Treated w/ Xifaxan and did well overall.   She has a history of small intestinal bacterial overgrowth (SIBO) and was treated with rifaximin for twelve weeks, which  alleviated her severe gas and diarrhea. However, she now experiences chronic diarrhea, requiring Imodium for temporary relief. 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  Previously 6 doses Xifaxan, 2 weeks Still with episodes severe gas and diarrhea bloating etc  Interested in other meds  Awaiting upcoming Hematology q 6 month iron anemia panel  Health Maintenance:  Next Colonoscopy or screening due in 10 years from last 2022, therefore next 2032, since no specimens were collected  She is due for a pneumonia vaccine and has completed other recommended screenings such as mammogram and colonoscopy.     03/24/2024    1:58 PM 12/13/2023   11:04 AM 06/26/2023    2:20 PM  Depression screen PHQ 2/9  Decreased Interest 1 1 0  Down, Depressed, Hopeless 1 0 0  PHQ - 2 Score 2 1 0  Altered sleeping 2 0 0  Tired, decreased energy 2 1 2   Change in appetite 2 0 0  Feeling bad or failure about yourself  1 0 0  Trouble concentrating 0 0 0  Moving slowly or fidgety/restless 0 0 0  Suicidal thoughts 0 0 0  PHQ-9 Score 9 2 2   Difficult doing work/chores Somewhat difficult Somewhat difficult        03/24/2024    1:58 PM 12/13/2023   11:04 AM 06/26/2023    2:21 PM 02/23/2023    4:31 PM  GAD  7 : Generalized Anxiety Score  Nervous, Anxious, on Edge 0 1 0 1  Control/stop worrying 0 1 0 1  Worry too much - different things 1 1 0 1  Trouble relaxing 0 0 0 0  Restless 0 0 0 0  Easily annoyed or irritable 1 0 0 0  Afraid - awful might happen 0 0 0 0  Total GAD 7 Score 2 3 0 3  Anxiety Difficulty Not difficult at all  Not difficult at all Not difficult at all     Past Medical History:  Diagnosis Date   Advanced care planning/counseling discussion 09/16/2018   Anemia    Arthritis    Chiari malformation type I (HCC)    Depression    Diabetes mellitus, type 2  (HCC)    GERD (gastroesophageal reflux disease)    History of hiatal hernia    Hyperlipidemia    Hypertension    Iron deficiency anemia    Irritable bowel syndrome with diarrhea    Osteoarthritis of left knee    PONV (postoperative nausea and vomiting)    Restless legs    Seasonal allergies    Sleep apnea    no Cpap use   Small intestinal bacterial overgrowth    Spinal stenosis    Vaccine reaction, initial encounter 03/11/2020   Past Surgical History:  Procedure Laterality Date   BREAST BIOPSY Right    neg   chiari malformation I decompression surgery  10/2005   COLONOSCOPY WITH PROPOFOL N/A 11/28/2018   Procedure: COLONOSCOPY WITH PROPOFOL;  Surgeon: Wyline Mood, MD;  Location[facility] ENDOSCOPY;  Service: Gastroenterology;  Laterality: N/A;   COLONOSCOPY WITH PROPOFOL N/A 10/27/2021   Procedure: COLONOSCOPY WITH PROPOFOL;  Surgeon: Wyline Mood, MD;  Location: [facility] ENDOSCOPY;  Service: Gastroenterology;  Laterality: N/A;   ESOPHAGOGASTRODUODENOSCOPY N/A 10/27/2021   Procedure: ESOPHAGOGASTRODUODENOSCOPY (EGD);  Surgeon: Wyline Mood, MD;  Location: [facility] ENDOSCOPY;  Service: Gastroenterology;  Laterality: N/A;   HERNIA REPAIR     s/p gastric sleeve   KNEE ARTHROSCOPY WITH MEDIAL MENISECTOMY Left 01/12/2022   Procedure: KNEE ARTHROSCOPY WITH MEDIAL MENISECTOMY;  Surgeon: Juanell Fairly, MD;  Location: [facility] ORS;  Service: Orthopedics;  Laterality: Left;   LAPAROSCOPIC GASTRIC SLEEVE RESECTION     LASIK Bilateral    NASAL SEPTUM SURGERY     TOTAL KNEE ARTHROPLASTY Left 06/05/2023   Procedure: TOTAL KNEE ARTHROPLASTY;  Surgeon: Juanell Fairly, MD;  Location: [facility] ORS;  Service: Orthopedics;  Laterality: Left;   Social History   Socioeconomic History   Marital status: Divorced    Spouse name: Not on file   Number of children: 2   Years of education: Not on file   Highest education level: Bachelor's degree (e.g., BA, AB, BS)  Occupational History   Occupation: part time    Tobacco Use   Smoking status: Former    Current packs/day: 0.00    Average packs/day: 0.5 packs/day for 1 year (0.5 ttl pk-yrs)    Types: Cigarettes    Start date: 07/26/1976    Quit date: 07/26/1977    Years since quitting: 46.6    Passive exposure: Never   Smokeless tobacco: Never   Tobacco comments:    barley smoked when smoked   Vaping Use   Vaping status: Never Used  Substance and Sexual Activity   Alcohol use: Yes    Alcohol/week: 0.0 standard drinks of alcohol    Comment: on occasion 1-2 x month   Drug use: No   Sexual activity:  Yes  Other Topics Concern   Not on file  Social History Narrative   Lives alone.   Social Drivers of Corporate investment banker Strain: Low Risk  (06/15/2023)   Overall Financial Resource Strain (CARDIA)    Difficulty of Paying Living Expenses: Not hard at all  Food Insecurity: No Food Insecurity (06/15/2023)   Hunger Vital Sign    Worried About Running Out of Food in the Last Year: Never true    Ran Out of Food in the Last Year: Never true  Transportation Needs: No Transportation Needs (06/15/2023)   PRAPARE - Administrator, Civil Service (Medical): No    Lack of Transportation (Non-Medical): No  Physical Activity: Inactive (10/17/2021)   Exercise Vital Sign    Days of Exercise per Week: 0 days    Minutes of Exercise per Session: 0 min  Stress: Stress Concern Present (10/17/2021)   [employer] of Occupational Health - Occupational Stress Questionnaire    Feeling of Stress : To some extent  Social Connections: Moderately Integrated (09/22/2019)   Social Connection and Isolation Panel [NHANES]    Frequency of Communication with Friends and Family: More than three times a week    Frequency of Social Gatherings with Friends and Family: More than three times a week    Attends Religious Services: More than 4 times per year    Active Member of [employer] or Organizations: Yes    Attends Engineer, structural: More than 4  times per year    Marital Status: Divorced  Intimate Partner Violence: Not At Risk (06/10/2023)   Humiliation, Afraid, Rape, and Kick questionnaire    Fear of Current or Ex-Partner: No    Emotionally Abused: No    Physically Abused: No    Sexually Abused: No   Family History  Problem Relation Age of Onset   Hypertension Mother    Cancer Mother    Stroke Mother    Heart disease Father    Breast cancer Sister 32   Breast cancer Paternal Aunt    Diabetes Paternal Grandmother    Current Outpatient Medications on File Prior to Visit  Medication Sig   ACCU-CHEK GUIDE test strip Use to check blood sugar 1 x daily as instructed   Accu-Chek Softclix Lancets lancets TEST TWO TO THREE TIMES DAILY DX E11.69   acetaminophen (TYLENOL) 325 MG tablet Take 2 tablets (650 mg total) by mouth every 6 (six) hours as needed for mild pain, moderate pain, fever or headache (or Fever >/= 101).   albuterol (VENTOLIN HFA) 108 (90 Base) MCG/ACT inhaler Inhale 2 puffs into the lungs every 4 (four) hours as needed for wheezing or shortness of breath.   atorvastatin (LIPITOR) 40 MG tablet TAKE 1 TABLET(40 MG) BY MOUTH AT BEDTIME   azelastine (ASTELIN) 0.1 % nasal spray Place 1 spray into both nostrils 2 (two) times daily.   Blood Glucose Monitoring Suppl (ACCU-CHEK GUIDE) w/Device KIT 1 Device by Does not apply route 4 (four) times daily. DX E11.69   Carboxymethylcellul-Glycerin (LUBRICATING EYE DROPS OP) Place 1 drop into both eyes daily as needed (dry eyes).   Cholecalciferol 125 MCG (5000 UT) TABS Take 1 tablet by mouth daily.   levETIRAcetam (KEPPRA) 500 MG tablet TAKE 1 TABLET(500 MG) BY MOUTH TWICE DAILY   Lidocaine-Menthol (ICY HOT MAX LIDOCAINE EX) Apply 1 application. topically daily as needed (pain).   Spacer/Aero-Holding Chambers (AEROCHAMBER PLUS WITH MASK) inhaler Use with inhaler as needed.   gabapentin (  NEURONTIN) 600 MG tablet TAKE 1 TABLET(600 MG) BY MOUTH AT BEDTIME (Patient not taking: Reported  on 03/24/2024)   potassium chloride SA (KLOR-CON M) 20 MEQ tablet Take 1 tablet (20 mEq total) by mouth daily for 3 days.   No current facility-administered medications on file prior to visit.    Review of Systems  Constitutional:  Negative for activity change, appetite change, chills, diaphoresis, fatigue and fever.  HENT:  Negative for congestion and hearing loss.   Eyes:  Negative for visual disturbance.  Respiratory:  Negative for cough, chest tightness, shortness of breath and wheezing.   Cardiovascular:  Negative for chest pain, palpitations and leg swelling.  Gastrointestinal:  Negative for abdominal pain, constipation, diarrhea, nausea and vomiting.  Genitourinary:  Negative for dysuria, frequency and hematuria.  Musculoskeletal:  Negative for arthralgias and neck pain.  Skin:  Negative for rash.  Neurological:  Negative for dizziness, weakness, light-headedness, numbness and headaches.  Hematological:  Negative for adenopathy.  Psychiatric/Behavioral:  Negative for behavioral problems, dysphoric mood and sleep disturbance.    Per HPI unless specifically indicated above     Objective:    BP (!) 142/88 (BP Location: Left Arm, Patient Position: Sitting, Cuff Size: Normal)   Pulse 66   Ht 5\' 2"  (1.575 m)   Wt 172 lb (78 kg)   SpO2 99%   BMI 31.46 kg/m   Wt Readings from Last 3 Encounters:  03/24/24 172 lb (78 kg)  01/10/24 168 lb (76.2 kg)  12/13/23 173 lb (78.5 kg)    Physical Exam Vitals and nursing note reviewed.  Constitutional:      General: She is not in acute distress.    Appearance: She is well-developed. She is not diaphoretic.     Comments: Well-appearing, comfortable, cooperative  HENT:     Head: Normocephalic and atraumatic.  Eyes:     General:        Right eye: No discharge.        Left eye: No discharge.     Conjunctiva/sclera: Conjunctivae normal.     Pupils: Pupils are equal, round, and reactive to light.  Neck:     Thyroid: No thyromegaly.      Vascular: No carotid bruit.  Cardiovascular:     Rate and Rhythm: Normal rate and regular rhythm.     Pulses: Normal pulses.     Heart sounds: Normal heart sounds. No murmur heard. Pulmonary:     Effort: Pulmonary effort is normal. No respiratory distress.     Breath sounds: Normal breath sounds. No wheezing or rales.  Abdominal:     General: Bowel sounds are normal. There is no distension.     Palpations: Abdomen is soft. There is no mass.     Tenderness: There is no abdominal tenderness.  Musculoskeletal:        General: No tenderness. Normal range of motion.     Cervical back: Normal range of motion and neck supple.     Right lower leg: No edema.     Left lower leg: No edema.     Comments: Upper / Lower Extremities: - Normal muscle tone, strength bilateral upper extremities 5/5, lower extremities 5/5  Lymphadenopathy:     Cervical: No cervical adenopathy.  Skin:    General: Skin is warm and dry.     Findings: No erythema or rash.  Neurological:     Mental Status: She is alert and oriented to person, place, and time.     Comments: Distal  sensation intact to light touch all extremities  Psychiatric:        Mood and Affect: Mood normal.        Behavior: Behavior normal.        Thought Content: Thought content normal.     Comments: Well groomed, good eye contact, normal speech and thoughts     Diabetic Foot Exam - Simple   Simple Foot Form Diabetic Foot exam was performed with the following findings: Yes 03/24/2024  1:49 PM  Visual Inspection No deformities, no ulcerations, no other skin breakdown bilaterally: Yes Sensation Testing Intact to touch and monofilament testing bilaterally: Yes Pulse Check Posterior Tibialis and Dorsalis pulse intact bilaterally: Yes Comments      I have personally reviewed the radiology report from 01/31/24 on Mammogram.  CLINICAL DATA:  Screening.   EXAM: DIGITAL SCREENING BILATERAL MAMMOGRAM WITH TOMOSYNTHESIS AND CAD    TECHNIQUE: Bilateral screening digital craniocaudal and mediolateral oblique mammograms were obtained. Bilateral screening digital breast tomosynthesis was performed. The images were evaluated with computer-aided detection.   COMPARISON:  Previous exam(s).   ACR Breast Density Category b: There are scattered areas of fibroglandular density.   FINDINGS: There are no findings suspicious for malignancy.   IMPRESSION: No mammographic evidence of malignancy. A result letter of this screening mammogram will be mailed directly to the patient.   RECOMMENDATION: Screening mammogram in one year. (Code:[id number]-B-01Y)   BI-RADS CATEGORY  1: Negative.     Electronically Signed   By: Harmon Pier M.D.   On: 02/04/2024 15:45  Results for orders placed or performed in visit on 03/13/24  VITAMIN D 25 Hydroxy (Vit-D Deficiency, Fractures)   Collection Time: 03/14/24  9:07 AM  Result Value Ref Range   Vit D, 25-Hydroxy 39 30 - 100 ng/mL  TSH   Collection Time: 03/14/24  9:07 AM  Result Value Ref Range   TSH 2.58 0.40 - 4.50 mIU/L  Microalbumin / creatinine urine ratio   Collection Time: 03/14/24  9:07 AM  Result Value Ref Range   Creatinine, Urine 60 20 - 275 mg/dL   Microalb, Ur 1.3 mg/dL   Microalb Creat Ratio 22 <30 mg/g creat  COMPLETE METABOLIC PANEL WITH GFR   Collection Time: 03/14/24  9:07 AM  Result Value Ref Range   Glucose, Bld 139 (H) 65 - 99 mg/dL   BUN 15 7 - 25 mg/dL   Creat 1.61 0.96 - 0.45 mg/dL   BUN/Creatinine Ratio SEE NOTE: 6 - 22 (calc)   Sodium 143 135 - 146 mmol/L   Potassium 3.7 3.5 - 5.3 mmol/L   Chloride 103 98 - 110 mmol/L   CO2 31 20 - 32 mmol/L   Calcium 8.7 8.6 - 10.4 mg/dL   Total Protein 6.5 6.1 - 8.1 g/dL   Albumin 3.9 3.6 - 5.1 g/dL   Globulin 2.6 1.9 - 3.7 g/dL (calc)   AG Ratio 1.5 1.0 - 2.5 (calc)   Total Bilirubin 0.5 0.2 - 1.2 mg/dL   Alkaline phosphatase (APISO) 118 37 - 153 U/L   AST 12 10 - 35 U/L   ALT 7 6 - 29 U/L  CBC with  Differential/Platelet   Collection Time: 03/14/24  9:07 AM  Result Value Ref Range   WBC 6.4 3.8 - 10.8 Thousand/uL   RBC 4.28 3.80 - 5.10 Million/uL   Hemoglobin 11.2 (L) 11.7 - 15.5 g/dL   HCT 40.9 81.1 - 91.4 %   MCV 84.6 80.0 - 100.0 fL   MCH  26.2 (L) 27.0 - 33.0 pg   MCHC 30.9 (L) 32.0 - 36.0 g/dL   RDW 16.1 09.6 - 04.5 %   Platelets 292 140 - 400 Thousand/uL   MPV 10.1 7.5 - 12.5 fL   Neutro Abs 4,595 1,500 - 7,800 cells/uL   Absolute Lymphocytes 1,152 850 - 3,900 cells/uL   Absolute Monocytes 358 200 - 950 cells/uL   Eosinophils Absolute 262 15 - 500 cells/uL   Basophils Absolute 32 0 - 200 cells/uL   Neutrophils Relative % 71.8 %   Total Lymphocyte 18.0 %   Monocytes Relative 5.6 %   Eosinophils Relative 4.1 %   Basophils Relative 0.5 %  Hemoglobin A1c   Collection Time: 03/14/24  9:07 AM  Result Value Ref Range   Hgb A1c MFr Bld 7.3 (H) <5.7 % of total Hgb   Mean Plasma Glucose 163 mg/dL   eAG (mmol/L) 9.0 mmol/L  Lipid panel   Collection Time: 03/14/24  9:07 AM  Result Value Ref Range   Cholesterol 156 <200 mg/dL   HDL 52 > OR = 50 mg/dL   Triglycerides 409 <811 mg/dL   LDL Cholesterol (Calc) 81 mg/dL (calc)   Total CHOL/HDL Ratio 3.0 <5.0 (calc)   Non-HDL Cholesterol (Calc) 104 <130 mg/dL (calc)      Assessment & Plan:   Problem List Items Addressed This Visit     Anxiety   Relevant Medications   Vilazodone HCl (VIIBRYD) 40 MG TABS   LORazepam (ATIVAN) 1 MG tablet   Depression, recurrent (HCC)   Relevant Medications   Vilazodone HCl (VIIBRYD) 40 MG TABS   LORazepam (ATIVAN) 1 MG tablet   Essential hypertension   Relevant Medications   colestipol (COLESTID) 5 g granules   carvedilol (COREG) 6.25 MG tablet   valsartan (DIOVAN) 320 MG tablet   GERD (gastroesophageal reflux disease)   Relevant Medications   omeprazole (PRILOSEC) 40 MG capsule   Iron deficiency anemia   Irritable bowel syndrome with diarrhea   Relevant Medications   colestipol  (COLESTID) 5 g granules   omeprazole (PRILOSEC) 40 MG capsule   PTSD (post-traumatic stress disorder)   Relevant Medications   Vilazodone HCl (VIIBRYD) 40 MG TABS   LORazepam (ATIVAN) 1 MG tablet   Type 2 diabetes mellitus with hyperlipidemia (HCC)   Relevant Medications   colestipol (COLESTID) 5 g granules   carvedilol (COREG) 6.25 MG tablet   valsartan (DIOVAN) 320 MG tablet   Vitamin D deficiency   Other Visit Diagnoses       Annual physical exam    -  Primary     Chronic pain of left knee       Relevant Medications   cyclobenzaprine (FLEXERIL) 10 MG tablet   Vilazodone HCl (VIIBRYD) 40 MG TABS     Primary osteoarthritis of left knee       Relevant Medications   cyclobenzaprine (FLEXERIL) 10 MG tablet        Updated Health Maintenance information Reviewed recent lab results with patient Encouraged improvement to lifestyle with diet and exercise Goal of weight loss   Chronic Diarrhea / IBS vs SIBO Persistent diarrhea despite previous SIBO treatment. Discussed bile acid-related diarrhea and bowel regulation options. - Prescribe cholestyramine with meals, twice daily, for a two-week trial. Continue if effective. - Recommend peppermint oil (IBgard) as an over-the-counter option. - Follow up with gastroenterologist if symptoms persist or worsen.  Type 2 Diabetes Mellitus A1c at 7.3, managed with lifestyle modifications. Difficulty reducing Dr. Reino Kent  intake noted. - Continue lifestyle management. Not on medication - Monitor A1c every six months.  Hyperlipidemia Cholesterol levels controlled with atorvastatin. - Continue atorvastatin. - Monitor cholesterol levels regularly.  Mild Anemia Hemoglobin improved to 11.2. No current need for additional iron. Followed by Hematology, upcoming anemia panel every 6 months - Recheck hemoglobin levels on April 08, 2024.  Major Depression recurrent Anxiety PTSD No longer returning to Psychiatry OFF Buspar due to serotonin  syndrome Re order Vilazodone 40mg  daily, Continue Lorazepam  General Health Maintenance Due for Prevnar 20 vaccine and foot check. Eye exam scheduled. Colonoscopy and mammogram up to date. - Obtain Prevnar 20 vaccine at pharmacy. - Perform annual foot check. - Complete eye exam in July. - Continue routine health screenings.  Follow-up Regular follow-up necessary for chronic conditions and health maintenance. - Schedule follow-up in early October 2025 for diabetes monitoring and general health check. - Provide printed follow-up appointment details.        No orders of the defined types were placed in this encounter.   Meds ordered this encounter  Medications   DISCONTD: colestipol (COLESTID) 5 g granules    Sig: Take 5 g by mouth 2 (two) times daily.    Dispense:  500 g    Refill:  12   colestipol (COLESTID) 5 g granules    Sig: Take 5 g by mouth 2 (two) times daily with a meal.    Dispense:  300 g    Refill:  2    Please adjust quantity and refills - initial sent accidentally at 500g. It should be 300g   omeprazole (PRILOSEC) 40 MG capsule    Sig: Take 1 capsule (40 mg total) by mouth daily.    Dispense:  90 capsule    Refill:  3   carvedilol (COREG) 6.25 MG tablet    Sig: Take 1 tablet (6.25 mg total) by mouth 2 (two) times daily with a meal.    Dispense:  180 tablet    Refill:  3   cyclobenzaprine (FLEXERIL) 10 MG tablet    Sig: Take 1 tablet (10 mg total) by mouth 3 (three) times daily as needed for muscle spasms.    Dispense:  90 tablet    Refill:  3   Vilazodone HCl (VIIBRYD) 40 MG TABS    Sig: Take 1 tablet (40 mg total) by mouth daily.    Dispense:  90 tablet    Refill:  1   valsartan (DIOVAN) 320 MG tablet    Sig: Take 1 tablet (320 mg total) by mouth daily.    Dispense:  90 tablet    Refill:  3   LORazepam (ATIVAN) 1 MG tablet    Sig: Take 0.5-1 tablets (0.5-1 mg total) by mouth daily as needed for anxiety.    Dispense:  30 tablet    Refill:  2      Follow up plan: Return for 6 month DM A1c.  Saralyn Pilar, DO [facility] 03/24/2024, 1:17 PM

## 2024-03-24 NOTE — Patient Instructions (Addendum)
 Thank you for coming to the office today.  Prevnar-20 pneumonia vaccine at the pharmacy  OTC Peppermint Oil (Triple Coated Capsule) 180mg  take one 3 times daily to reduce diarrhea  Next try the new rx Colestipol bile acid medicaid taken with meals for reducing urgency diarrhea. Twice a day dosing, trial 2 weeks and can continue if effective. Follow up later with Dr Tobi Bastos - now at [facility] if needed.  All other meds refilled.  Please schedule a Follow-up Appointment to: Return for 6 month DM A1c.  If you have any other questions or concerns, please feel free to call the office or send a message through MyChart. You may also schedule an earlier appointment if necessary.  Additionally, you may be receiving a survey about your experience at our office within a few days to 1 week by e-mail or mail. We value your feedback.  Saralyn Pilar, DO [facility], [place]

## 2024-04-01 ENCOUNTER — Other Ambulatory Visit: Payer: Self-pay | Admitting: Family Medicine

## 2024-04-01 DIAGNOSIS — F419 Anxiety disorder, unspecified: Secondary | ICD-10-CM

## 2024-04-01 DIAGNOSIS — E782 Mixed hyperlipidemia: Secondary | ICD-10-CM

## 2024-04-01 DIAGNOSIS — G935 Compression of brain: Secondary | ICD-10-CM

## 2024-04-01 NOTE — Telephone Encounter (Unsigned)
 Copied from CRM [id number]. Topic: Clinical - Medication Refill >> Apr 01, 2024  9:30 AM Everlene Hobby D wrote: Most Recent Primary Care Visit:  Provider: Raina Bunting  Department: [facility]  Visit Type: PHYSICAL  Date: 03/24/2024  Medication: atorvastatin (LIPITOR) 40 MG tablet, levETIRAcetam (KEPPRA) 500 MG tablet, LORazepam (ATIVAN) 1 MG tablet  Has the patient contacted their pharmacy? Yes (Agent: If no, request that the patient contact the pharmacy for the refill. If patient does not wish to contact the pharmacy document the reason why and proceed with request.) (Agent: If yes, when and what did the pharmacy advise?)  Is this the correct pharmacy for this prescription? Yes If no, delete pharmacy and type the correct one.  This is the patient's preferred pharmacy:  [id number] - [place], [place] - [address] AT [facility] OF [address] [place] [address] Phone: [phone] Fax: [phone]   Has the prescription been filled recently? Yes LORazepam (ATIVAN) 1 MG tablet was filled recently for a 30 day supply the others were not  Is the patient out of the medication? Yes  Has the patient been seen for an appointment in the last year OR does the patient have an upcoming appointment? Yes  Can we respond through MyChart? No  Agent: Please be advised that Rx refills may take up to 3 business days. We ask that you follow-up with your pharmacy.

## 2024-04-02 ENCOUNTER — Telehealth: Payer: Self-pay

## 2024-04-02 NOTE — Telephone Encounter (Signed)
 Copied from CRM [id number]. Topic: Clinical - Prescription Issue >> Apr 02, 2024 10:39 AM Baldemar Lev wrote: Reason for CRM: Pt called reporting that all of her medications need to come from [facility] delivery pharmacy except for LORazepam (ATIVAN) 1 MG tablet which will come from [facility] on Chart.

## 2024-04-02 NOTE — Telephone Encounter (Signed)
 Call to pharmacy- all medications requested are on file at pharmacy and not due at this time. Call to patient- left message to call office - she should have refills as in medication list OV 03/24/24 Requested Prescriptions  Pending Prescriptions Disp Refills   atorvastatin (LIPITOR) 40 MG tablet 90 tablet 3     Cardiovascular:  Antilipid - Statins Failed - 04/02/2024 10:28 AM      Failed - Valid encounter within last 12 months    Recent Outpatient Visits           1 week ago Annual physical exam   [facility] [place], Netta Neat, DO              Failed - Lipid Panel in normal range within the last 12 months    Cholesterol, Total  Date Value Ref Range Status  05/31/2021 158 100 - 199 mg/dL Final   Cholesterol  Date Value Ref Range Status  03/14/2024 156 <200 mg/dL Final   Cholesterol [place], [place]  Date Value Ref Range Status  03/07/2018 175 <200 mg/dL Final    Comment:                            Desirable                <200                         Borderline High      200- 239                         High                     >239    LDL Cholesterol (Calc)  Date Value Ref Range Status  03/14/2024 81 mg/dL (calc) Final    Comment:    Reference range: <100 . Desirable range <100 mg/dL for primary prevention;   <70 mg/dL for patients with CHD or diabetic patients  with > or = 2 CHD risk factors. Marland Kitchen LDL-C is now calculated using the Martin-Hopkins  calculation, which is a validated novel method providing  better accuracy than the Friedewald equation in the  estimation of LDL-C.  Horald Pollen et al. Lenox Ahr. 1610;960(45): 2061-2068  (http://education.[facility].com/faq/FAQ164)    HDL  Date Value Ref Range Status  03/14/2024 52 > OR = 50 mg/dL Final  40/98/1191 62 >47 mg/dL Final   Triglycerides  Date Value Ref Range Status  03/14/2024 130 <150 mg/dL Final   Triglycerides Piccolo,Waived  Date Value Ref Range Status   03/07/2018 164 (H) <150 mg/dL Final    Comment:                            Normal                   <150                         Borderline High     150 - 199                         High                200 - 499  Very High                >499          Passed - Patient is not pregnant       levETIRAcetam (KEPPRA) 500 MG tablet 180 tablet 3     Neurology:  Anticonvulsants - levetiracetam Failed - 04/02/2024 10:28 AM      Failed - Valid encounter within last 12 months    Recent Outpatient Visits           1 week ago Annual physical exam   [facility] [place], Kayleen Party, DO              Passed - Cr in normal range and within 360 days    Creatinine  Date Value Ref Range Status  01/11/2021 54.2 20.0 - 300.0 mg/dL Final   Creat  Date Value Ref Range Status  03/14/2024 0.65 0.60 - 1.00 mg/dL Final   Creatinine, Urine  Date Value Ref Range Status  03/14/2024 60 20 - 275 mg/dL Final         Passed - Completed PHQ-2 or PHQ-9 in the last 360 days       LORazepam (ATIVAN) 1 MG tablet 30 tablet 2    Sig: Take 0.5-1 tablets (0.5-1 mg total) by mouth daily as needed for anxiety.     Not Delegated - Psychiatry: Anxiolytics/Hypnotics 2 Failed - 04/02/2024 10:28 AM      Failed - This refill cannot be delegated      Failed - Urine Drug Screen completed in last 360 days      Failed - Valid encounter within last 6 months    Recent Outpatient Visits           1 week ago Annual physical exam   [facility] [place], Kayleen Party, [place]              Passed - Patient is not pregnant

## 2024-04-03 NOTE — Telephone Encounter (Signed)
 Lm for patient to return call, not sure if patient needs refills or if this is just a FYI.

## 2024-04-08 ENCOUNTER — Inpatient Hospital Stay: Payer: Medicare PPO | Admitting: Internal Medicine

## 2024-04-08 ENCOUNTER — Encounter: Payer: Self-pay | Admitting: Internal Medicine

## 2024-04-08 ENCOUNTER — Inpatient Hospital Stay: Payer: Medicare PPO | Attending: Internal Medicine

## 2024-04-08 VITALS — BP 174/80 | HR 73 | Temp 98.8°F | Resp 16 | Wt 172.0 lb

## 2024-04-08 DIAGNOSIS — Z87891 Personal history of nicotine dependence: Secondary | ICD-10-CM | POA: Insufficient documentation

## 2024-04-08 DIAGNOSIS — Z9884 Bariatric surgery status: Secondary | ICD-10-CM | POA: Insufficient documentation

## 2024-04-08 DIAGNOSIS — D508 Other iron deficiency anemias: Secondary | ICD-10-CM

## 2024-04-08 DIAGNOSIS — Z803 Family history of malignant neoplasm of breast: Secondary | ICD-10-CM | POA: Diagnosis not present

## 2024-04-08 DIAGNOSIS — D509 Iron deficiency anemia, unspecified: Secondary | ICD-10-CM | POA: Insufficient documentation

## 2024-04-08 LAB — CBC WITH DIFFERENTIAL (CANCER CENTER ONLY)
Abs Immature Granulocytes: 0.03 10*3/uL (ref 0.00–0.07)
Basophils Absolute: 0 10*3/uL (ref 0.0–0.1)
Basophils Relative: 0 %
Eosinophils Absolute: 0.3 10*3/uL (ref 0.0–0.5)
Eosinophils Relative: 4 %
HCT: 33.4 % — ABNORMAL LOW (ref 36.0–46.0)
Hemoglobin: 10.5 g/dL — ABNORMAL LOW (ref 12.0–15.0)
Immature Granulocytes: 0 %
Lymphocytes Relative: 17 %
Lymphs Abs: 1.3 10*3/uL (ref 0.7–4.0)
MCH: 26.6 pg (ref 26.0–34.0)
MCHC: 31.4 g/dL (ref 30.0–36.0)
MCV: 84.8 fL (ref 80.0–100.0)
Monocytes Absolute: 0.4 10*3/uL (ref 0.1–1.0)
Monocytes Relative: 6 %
Neutro Abs: 5.3 10*3/uL (ref 1.7–7.7)
Neutrophils Relative %: 73 %
Platelet Count: 272 10*3/uL (ref 150–400)
RBC: 3.94 MIL/uL (ref 3.87–5.11)
RDW: 15.3 % (ref 11.5–15.5)
WBC Count: 7.4 10*3/uL (ref 4.0–10.5)
nRBC: 0 % (ref 0.0–0.2)

## 2024-04-08 LAB — IRON AND TIBC
Iron: 31 ug/dL (ref 28–170)
Saturation Ratios: 10 % — ABNORMAL LOW (ref 10.4–31.8)
TIBC: 307 ug/dL (ref 250–450)
UIBC: 276 ug/dL

## 2024-04-08 LAB — VITAMIN B12: Vitamin B-12: 643 pg/mL (ref 180–914)

## 2024-04-08 LAB — FERRITIN: Ferritin: 20 ng/mL (ref 11–307)

## 2024-04-08 NOTE — Progress Notes (Signed)
 Amanda Wang  [phone] [phone]  ID: Amanda Wang OB: 07-Jan-1951  MR#: [id number]  QIO#:[id number]  Patient Care Team: Amanda Bunting, DO as PCP - General (Family Medicine) Amanda Sink, MD as Consulting Physician (Gastroenterology) Amanda Rodney, MD as Consulting Physician (Oncology)   HPI: Amanda Wang is a 73 y.o. female with past medical history of hypertension, hyperlipidemia, GERD, chiari malformation type I, arthritis, sleep apnea was referred to hematology for management of iron  deficiency anemia  Patient was seen by primary on February 23, 2023 for symptomatic anemia. CBC showed hemoglobin 8.5, MCV 73.5.  Iron  panel showed saturation 9%.  Ferritin 6.  B12 level 561.  Patient has history of gastric bypass surgery.  She has chronic history of iron  deficiency anemia.  Colonoscopy performed by Dr. Antony Wang in November 2022 was normal.  Endoscopy showed multiple 5 to 10 mm sessile polyp without bleeding in the stomach.  Biopsy showed hyperplastic polyps.  Completed IV Venofer  in April 2024.  Interval history- Patient seen today as follow-up for iron  deficiency anemia, labs. Doing okay. Feels tired which is chronic. Denies bleeding in urine/stools.  Stopped b12 supplements now got back on it.   REVIEW OF SYSTEMS:   ROS  As per HPI. Otherwise, a complete review of systems is negative.  PAST MEDICAL HISTORY: Past Medical History:  Diagnosis Date   Advanced care planning/counseling discussion 09/16/2018   Anemia    Arthritis    Chiari malformation type I (HCC)    Depression    Diabetes mellitus, type 2 (HCC)    GERD (gastroesophageal reflux disease)    History of hiatal hernia    Hyperlipidemia    Hypertension    Iron  deficiency anemia    Irritable bowel syndrome with diarrhea    Osteoarthritis of left knee    PONV (postoperative nausea and vomiting)    Restless legs    Seasonal allergies    Sleep apnea    no  Cpap use   Small intestinal bacterial overgrowth    Spinal stenosis    Vaccine reaction, initial encounter 03/11/2020    PAST SURGICAL HISTORY: Past Surgical History:  Procedure Laterality Date   BREAST BIOPSY Right    neg   chiari malformation I decompression surgery  10/2005   COLONOSCOPY WITH PROPOFOL  N/A 11/28/2018   Procedure: COLONOSCOPY WITH PROPOFOL ;  Surgeon: Amanda Salaam, MD;  Location: [facility] ENDOSCOPY;  Service: Gastroenterology;  Laterality: N/A;   COLONOSCOPY WITH PROPOFOL  N/A 10/27/2021   Procedure: COLONOSCOPY WITH PROPOFOL ;  Surgeon: Amanda Salaam, MD;  Location: [facility] ENDOSCOPY;  Service: Gastroenterology;  Laterality: N/A;   ESOPHAGOGASTRODUODENOSCOPY N/A 10/27/2021   Procedure: ESOPHAGOGASTRODUODENOSCOPY (EGD);  Surgeon: Amanda Salaam, MD;  Location: [facility] ENDOSCOPY;  Service: Gastroenterology;  Laterality: N/A;   HERNIA REPAIR     s/p gastric sleeve   KNEE ARTHROSCOPY WITH MEDIAL MENISECTOMY Left 01/12/2022   Procedure: KNEE ARTHROSCOPY WITH MEDIAL MENISECTOMY;  Surgeon: Amanda Bushy, MD;  Location: [facility] ORS;  Service: Orthopedics;  Laterality: Left;   LAPAROSCOPIC GASTRIC SLEEVE RESECTION     LASIK Bilateral    NASAL SEPTUM SURGERY     TOTAL KNEE ARTHROPLASTY Left 06/05/2023   Procedure: TOTAL KNEE ARTHROPLASTY;  Surgeon: Amanda Bushy, MD;  Location: [facility] ORS;  Service: Orthopedics;  Laterality: Left;    FAMILY HISTORY: Family History  Problem Relation Age of Onset   Hypertension Mother    Cancer Mother    Stroke Mother    Heart disease Father  Breast cancer Sister 19   Breast cancer Paternal Aunt    Diabetes Paternal Grandmother     HEALTH MAINTENANCE: Social History   Tobacco Use   Smoking status: Former    Current packs/day: 0.00    Average packs/day: 0.5 packs/day for 1 year (0.5 ttl pk-yrs)    Types: Cigarettes    Start date: 07/26/1976    Quit date: 07/26/1977    Years since quitting: 46.7    Passive exposure: Never   Smokeless tobacco:  Never   Tobacco comments:    barley smoked when smoked   Vaping Use   Vaping status: Never Used  Substance Use Topics   Alcohol  use: Yes    Alcohol /week: 0.0 standard drinks of alcohol     Comment: on occasion 1-2 x month   Drug use: No     Allergies  Allergen Reactions   Fexofenadine Anxiety    Makes her have a panic attack   Amlodipine  Swelling   Codeine  Nausea Only    Dizziness   Methylphenidate Hcl     "Caused a seizure"   Zoloft [Sertraline] Other (See Comments)    Sleepiness   Magnevist [Gadopentetate] Hives and Itching    IV Contrast   Sulfa Antibiotics Rash    Current Outpatient Medications  Medication Sig Dispense Refill   ACCU-CHEK GUIDE test strip Use to check blood sugar 1 x daily as instructed 100 strip 3   Accu-Chek Softclix Lancets lancets TEST TWO TO THREE TIMES DAILY DX E11.69 300 each 2   acetaminophen  (TYLENOL ) 325 MG tablet Take 2 tablets (650 mg total) by mouth every 6 (six) hours as needed for mild pain, moderate pain, fever or headache (or Fever >/= 101).     albuterol  (VENTOLIN  HFA) 108 (90 Base) MCG/ACT inhaler Inhale 2 puffs into the lungs every 4 (four) hours as needed for wheezing or shortness of breath. 8 g 2   atorvastatin  (LIPITOR) 40 MG tablet TAKE 1 TABLET(40 MG) BY MOUTH AT BEDTIME 90 tablet 3   azelastine  (ASTELIN ) 0.1 % nasal spray Place 1 spray into both nostrils 2 (two) times daily.     Blood Glucose Monitoring Suppl (ACCU-CHEK GUIDE) w/Device KIT 1 Device by Does not apply route 4 (four) times daily. DX E11.69 1 kit 0   Carboxymethylcellul-Glycerin (LUBRICATING EYE DROPS OP) Place 1 drop into both eyes daily as needed (dry eyes).     carvedilol  (COREG ) 6.25 MG tablet Take 1 tablet (6.25 mg total) by mouth 2 (two) times daily with a meal. 180 tablet 3   colestipol  (COLESTID ) 5 g granules Take 5 g by mouth 2 (two) times daily with a meal. 300 g 2   cyclobenzaprine  (FLEXERIL ) 10 MG tablet Take 1 tablet (10 mg total) by mouth 3 (three) times  daily as needed for muscle spasms. 90 tablet 3   levETIRAcetam  (KEPPRA ) 500 MG tablet TAKE 1 TABLET(500 MG) BY MOUTH TWICE DAILY 180 tablet 3   Lidocaine -Menthol (ICY HOT MAX LIDOCAINE  EX) Apply 1 application. topically daily as needed (pain).     LORazepam  (ATIVAN ) 1 MG tablet Take 0.5-1 tablets (0.5-1 mg total) by mouth daily as needed for anxiety. 30 tablet 2   omeprazole  (PRILOSEC) 40 MG capsule Take 1 capsule (40 mg total) by mouth daily. 90 capsule 3   Spacer/Aero-Holding Chambers (AEROCHAMBER PLUS WITH MASK) inhaler Use with inhaler as needed. 1 each 0   valsartan  (DIOVAN ) 320 MG tablet Take 1 tablet (320 mg total) by mouth daily. 90 tablet 3  Vilazodone  HCl (VIIBRYD ) 40 MG TABS Take 1 tablet (40 mg total) by mouth daily. 90 tablet 1   Cholecalciferol 125 MCG (5000 UT) TABS Take 1 tablet by mouth daily.     gabapentin  (NEURONTIN ) 600 MG tablet TAKE 1 TABLET(600 MG) BY MOUTH AT BEDTIME (Patient not taking: Reported on 03/24/2024) 90 tablet 3   potassium chloride  SA (KLOR-CON  M) 20 MEQ tablet Take 1 tablet (20 mEq total) by mouth daily for 3 days. 3 tablet 0   No current facility-administered medications for this visit.    OBJECTIVE: Vitals:   04/08/24 1508  BP: (!) 174/80  Pulse: 73  Resp: 16  Temp: 98.8 F (37.1 C)  SpO2: 97%      Body mass index is 31.46 kg/m.      General: Well-developed, well-nourished, no acute distress. Eyes: Pink conjunctiva, anicteric sclera. HEENT: Normocephalic, moist mucous membranes, clear oropharnyx. Lungs: Clear to auscultation bilaterally. Heart: Regular rate and rhythm. No rubs, murmurs, or gallops. Abdomen: Soft, nontender, nondistended. No organomegaly noted, normoactive bowel sounds. Musculoskeletal: No edema, cyanosis, or clubbing. Neuro: Alert, answering all questions appropriately. Cranial nerves grossly intact. Skin: No rashes or petechiae noted. Psych: Normal affect. Lymphatics: No palpable lymphadenopathy in the neck.   LAB  RESULTS:  Lab Results  Component Value Date   NA 143 03/14/2024   K 3.7 03/14/2024   CL 103 03/14/2024   CO2 31 03/14/2024   GLUCOSE 139 (H) 03/14/2024   BUN 15 03/14/2024   CREATININE 0.65 03/14/2024   CALCIUM  8.7 03/14/2024   PROT 6.5 03/14/2024   ALBUMIN 3.1 (L) 06/10/2023   AST 12 03/14/2024   ALT 7 03/14/2024   ALKPHOS 63 06/10/2023   BILITOT 0.5 03/14/2024   GFRNONAA >60 06/11/2023   GFRAA 96 01/11/2021    Lab Results  Component Value Date   WBC 7.4 04/08/2024   NEUTROABS 5.3 04/08/2024   HGB 10.5 (L) 04/08/2024   HCT 33.4 (L) 04/08/2024   MCV 84.8 04/08/2024   PLT 272 04/08/2024    Lab Results  Component Value Date   TIBC 274 10/09/2023   TIBC 274 06/04/2023   TIBC 342 02/20/2023   FERRITIN 32 10/09/2023   FERRITIN 67 06/04/2023   FERRITIN 6 (L) 02/20/2023   IRONPCTSAT 18 10/09/2023   IRONPCTSAT 20 06/04/2023   IRONPCTSAT 9 (L) 02/20/2023     STUDIES: No results found.  ASSESSMENT AND PLAN:   CAILYN HOUDEK is a 73 y.o. female with pmh of hypertension, hyperlipidemia, GERD, chiari malformation type I, arthritis, sleep apnea was referred to hematology for management of iron  deficiency anemia  # Iron  deficiency anemia # History of gastric bypass surgery -Likely malabsorption from gastric bypass surgery. Colonoscopy performed by Dr. Antony Wang in November 2022 was normal.  Endoscopy showed multiple 5 to 10 mm sessile polyp without bleeding in the stomach.  Biopsy showed hyperplastic polyps.  -Ferritin of 6 and Hb 8.5 (02/21/2023).  Completed IV Venofer  200 mg weekly x 5 doses in April 2024.    -Hemoglobin today is 10.5 lower than the last visit.  Her iron  level is pending.  If the iron  stores come back low, we will call her to schedule for iron  infusion.  Also, discussed about capsule endoscopy if worsening iron  stores.   -Continue with vitamin B12 1000 mcg daily.  B12 level from June 2024 308.   Orders Placed This Encounter  Procedures   Vitamin B12    Ferritin   Iron  and TIBC   CBC  with Differential (Cancer Wang Only)   RTC in 6 months for MD visit, labs, possible venofer    Patient expressed understanding and was in agreement with this plan. She also understands that She can call clinic at any time with any questions, concerns, or complaints.   I spent a total of 25 minutes reviewing chart data, face-to-face evaluation with the patient, counseling and coordination of care as detailed above.  Amanda Rodney, MD   04/08/2024 3:29 PM

## 2024-04-10 ENCOUNTER — Other Ambulatory Visit: Payer: Self-pay | Admitting: *Deleted

## 2024-04-10 ENCOUNTER — Telehealth: Payer: Self-pay | Admitting: Internal Medicine

## 2024-04-10 ENCOUNTER — Telehealth: Payer: Self-pay | Admitting: *Deleted

## 2024-04-10 DIAGNOSIS — D508 Other iron deficiency anemias: Secondary | ICD-10-CM

## 2024-04-10 NOTE — Telephone Encounter (Signed)
 Labs reviewed.   On labs, has worsening iron  deficiency.  Left voicemail to schedule for IV venofer  weekly x 5. She has hx of gastric sleeve which would be contributing. Also, requested to let us  know if okay to reach out to GI for capsule endoscopy to complete the work up.   Scheduling message sent for iron  infusions.

## 2024-04-10 NOTE — Addendum Note (Signed)
 Addended by: Mery Guadalupe on: 04/10/2024 11:20 AM   Modules accepted: Orders

## 2024-04-10 NOTE — Telephone Encounter (Signed)
 P knows that she is getting IV iron  and they are setting up for her. She now says that she wants to go forward for the endoscopy. She wants her to make the referral.

## 2024-04-11 ENCOUNTER — Telehealth: Payer: Self-pay | Admitting: Family Medicine

## 2024-04-11 ENCOUNTER — Encounter: Payer: Self-pay | Admitting: Internal Medicine

## 2024-04-11 NOTE — Telephone Encounter (Signed)
 Copied from CRM [id number]. Topic: Clinical - Medication Refill >> Apr 11, 2024  3:44 PM Zina Hilts wrote: Most Recent Primary Care Visit:  Provider: Raina Bunting  Department: [facility]  Visit Type: PHYSICAL  Date: 03/24/2024  Medication: metformin  500 mg tablets elliquis 5 mg tabs enoxaparin  40mg  per 0.4 mL injection  Has the patient contacted their pharmacy? Yes (Agent: If no, request that the patient contact the pharmacy for the refill. If patient does not wish to contact the pharmacy document the reason why and proceed with request.) (Agent: If yes, when and what did the pharmacy advise?) Have [facility] contact pcp office and request refills   Is this the correct pharmacy for this prescription? Yes If no, delete pharmacy and type the correct one.  This is the patient's preferred pharmacy:   [facility] - [place], [place] - [address] [place] [address] Phone: [phone] Fax: [phone]   Has the prescription been filled recently? No  Is the patient out of the medication? Yes  Has the patient been seen for an appointment in the last year OR does the patient have an upcoming appointment? Yes  Can we respond through MyChart? No  Agent: Please be advised that Rx refills may take up to 3 business days. We ask that you follow-up with your pharmacy.

## 2024-04-11 NOTE — Telephone Encounter (Signed)
 Referral sent to DR. Antony Baumgartner

## 2024-04-14 ENCOUNTER — Telehealth: Payer: Self-pay

## 2024-04-14 ENCOUNTER — Other Ambulatory Visit: Payer: Self-pay

## 2024-04-14 NOTE — Telephone Encounter (Signed)
 Refill request

## 2024-04-14 NOTE — Telephone Encounter (Signed)
 We have received these requests for medications that patient is no longer taking. I have denied the request a few times now. But it seems we've received faxes and multiple calls that are still ongoing.  So I called the patient today and confirmed with her. She is no longer on Metformin , she finished Eliquis  (completed course, no longer needs) and we are not prescribing the Enoxaprin injections that was only short term as well not from our office.  She will also try to cancel these requests from [facility].  Please cancel and disregard these refill requests.  Domingo Friend, DO [facility] 04/14/2024, 12:00 PM

## 2024-04-17 ENCOUNTER — Inpatient Hospital Stay: Attending: Internal Medicine

## 2024-04-17 DIAGNOSIS — D509 Iron deficiency anemia, unspecified: Secondary | ICD-10-CM | POA: Insufficient documentation

## 2024-04-24 ENCOUNTER — Inpatient Hospital Stay

## 2024-04-24 VITALS — BP 159/71 | HR 76 | Temp 98.5°F | Resp 17

## 2024-04-24 DIAGNOSIS — Z903 Acquired absence of stomach [part of]: Secondary | ICD-10-CM

## 2024-04-24 DIAGNOSIS — D509 Iron deficiency anemia, unspecified: Secondary | ICD-10-CM | POA: Diagnosis present

## 2024-04-24 MED ORDER — SODIUM CHLORIDE 0.9% FLUSH
10.0000 mL | Freq: Once | INTRAVENOUS | Status: AC | PRN
  Administered 2024-04-24: 10 mL
  Filled 2024-04-24: qty 10

## 2024-04-24 MED ORDER — IRON SUCROSE 20 MG/ML IV SOLN
200.0000 mg | Freq: Once | INTRAVENOUS | Status: AC
  Administered 2024-04-24: 200 mg via INTRAVENOUS

## 2024-04-25 ENCOUNTER — Telehealth: Payer: Self-pay

## 2024-04-25 ENCOUNTER — Other Ambulatory Visit: Payer: Self-pay

## 2024-04-25 DIAGNOSIS — E782 Mixed hyperlipidemia: Secondary | ICD-10-CM

## 2024-04-25 DIAGNOSIS — G935 Compression of brain: Secondary | ICD-10-CM

## 2024-04-25 MED ORDER — LEVETIRACETAM 500 MG PO TABS: 500.0000 mg | ORAL_TABLET | Freq: Two times a day (BID) | ORAL | 3 refills | Status: AC

## 2024-04-25 MED ORDER — ATORVASTATIN CALCIUM 40 MG PO TABS: 40.0000 mg | ORAL_TABLET | Freq: Every day | ORAL | 3 refills | Status: AC

## 2024-04-25 NOTE — Telephone Encounter (Signed)
 Copied from CRM [id number]. Topic: Clinical - Prescription Issue >> Apr 25, 2024  2:46 PM Ivette P wrote: Reason for CRM: Trula Gable called in to see if faxed was received that was sent on 05/03 and will resend today. Please be on the lookout for fax of the refill request order.   levETIRAcetam  (KEPPRA ) 500 MG tablet atorvastatin  (LIPITOR) 40 MG tablet   [phone] - speak with anyone

## 2024-05-01 ENCOUNTER — Inpatient Hospital Stay

## 2024-05-01 VITALS — BP 173/70 | HR 77 | Resp 16

## 2024-05-01 DIAGNOSIS — Z903 Acquired absence of stomach [part of]: Secondary | ICD-10-CM

## 2024-05-01 DIAGNOSIS — D509 Iron deficiency anemia, unspecified: Secondary | ICD-10-CM

## 2024-05-01 MED ORDER — IRON SUCROSE 20 MG/ML IV SOLN
200.0000 mg | Freq: Once | INTRAVENOUS | Status: AC
  Administered 2024-05-01: 200 mg via INTRAVENOUS
  Filled 2024-05-01: qty 10

## 2024-05-03 ENCOUNTER — Other Ambulatory Visit: Payer: Self-pay | Admitting: Family Medicine

## 2024-05-03 DIAGNOSIS — K219 Gastro-esophageal reflux disease without esophagitis: Secondary | ICD-10-CM

## 2024-05-06 NOTE — Telephone Encounter (Signed)
 Too soon for refill, refilled 03/24/24 for 90 days.  Requested Prescriptions  Pending Prescriptions Disp Refills   omeprazole  (PRILOSEC) 40 MG capsule [Pharmacy Med Name: OMEPRAZOLE  40MG  CAPSULES] 90 capsule 3    Sig: TAKE 1 CAPSULE(40 MG) BY MOUTH DAILY BEFORE BREAKFAST     Gastroenterology: Proton Pump Inhibitors Failed - 05/06/2024  9:32 AM      Failed - Valid encounter within last 12 months    Recent Outpatient Visits           1 month ago Annual physical exam   [facility] [place], Amanda Wang, [place]

## 2024-05-08 ENCOUNTER — Inpatient Hospital Stay

## 2024-05-08 VITALS — BP 178/77 | HR 72 | Temp 98.9°F | Resp 18

## 2024-05-08 DIAGNOSIS — Z903 Acquired absence of stomach [part of]: Secondary | ICD-10-CM

## 2024-05-08 DIAGNOSIS — D509 Iron deficiency anemia, unspecified: Secondary | ICD-10-CM | POA: Diagnosis not present

## 2024-05-08 MED ORDER — IRON SUCROSE 20 MG/ML IV SOLN
200.0000 mg | Freq: Once | INTRAVENOUS | Status: AC
  Administered 2024-05-08: 200 mg via INTRAVENOUS

## 2024-05-08 NOTE — Patient Instructions (Signed)

## 2024-05-15 ENCOUNTER — Other Ambulatory Visit: Payer: Self-pay | Admitting: Oncology

## 2024-05-15 ENCOUNTER — Inpatient Hospital Stay

## 2024-05-15 VITALS — BP 172/84 | HR 78 | Temp 98.6°F | Resp 17

## 2024-05-15 DIAGNOSIS — D509 Iron deficiency anemia, unspecified: Secondary | ICD-10-CM | POA: Diagnosis not present

## 2024-05-15 DIAGNOSIS — Z903 Acquired absence of stomach [part of]: Secondary | ICD-10-CM

## 2024-05-15 MED ORDER — SODIUM CHLORIDE 0.9% FLUSH
10.0000 mL | Freq: Once | INTRAVENOUS | Status: AC | PRN
  Administered 2024-05-15: 10 mL
  Filled 2024-05-15: qty 10

## 2024-05-15 MED ORDER — IRON SUCROSE 20 MG/ML IV SOLN
200.0000 mg | Freq: Once | INTRAVENOUS | Status: AC
  Administered 2024-05-15: 200 mg via INTRAVENOUS

## 2024-05-17 ENCOUNTER — Other Ambulatory Visit: Payer: Self-pay | Admitting: Family Medicine

## 2024-05-17 DIAGNOSIS — I10 Essential (primary) hypertension: Secondary | ICD-10-CM

## 2024-05-19 NOTE — Telephone Encounter (Signed)
 Too soon for refill, refilled 03/24/24.  Requested Prescriptions  Pending Prescriptions Disp Refills   carvedilol  (COREG ) 6.25 MG tablet [Pharmacy Med Name: CARVEDILOL  6.25MG  TABLETS] 180 tablet 3    Sig: TAKE 1 TABLET(6.25 MG) BY MOUTH TWICE DAILY WITH A MEAL     Cardiovascular: Beta Blockers 3 Failed - 05/19/2024 12:39 PM      Failed - Last BP in normal range    BP Readings from Last 1 Encounters:  05/15/24 (!) 172/84         Failed - Valid encounter within last 6 months    Recent Outpatient Visits           1 month ago Annual physical exam   [facility] [place], Kayleen Party, DO              Passed - Cr in normal range and within 360 days    Creatinine  Date Value Ref Range Status  01/11/2021 54.2 20.0 - 300.0 mg/dL Final   Creat  Date Value Ref Range Status  03/14/2024 0.65 0.60 - 1.00 mg/dL Final   Creatinine, Urine  Date Value Ref Range Status  03/14/2024 60 20 - 275 mg/dL Final         Passed - AST in normal range and within 360 days    AST  Date Value Ref Range Status  03/14/2024 12 10 - 35 U/L Final   SGOT(AST)  Date Value Ref Range Status  07/24/2012 26 15 - 37 Unit/L Final   AST (SGOT) Piccolo, Waived  Date Value Ref Range Status  03/07/2018 26 11 - 38 U/L Final         Passed - ALT in normal range and within 360 days    ALT  Date Value Ref Range Status  03/14/2024 7 6 - 29 U/L Final   SGPT (ALT)  Date Value Ref Range Status  07/24/2012 26 12 - 78 U/L Final   ALT (SGPT) Piccolo, Waived  Date Value Ref Range Status  03/07/2018 23 10 - 47 U/L Final         Passed - Last Heart Rate in normal range    Pulse Readings from Last 1 Encounters:  05/15/24 78

## 2024-05-20 ENCOUNTER — Telehealth: Payer: Self-pay

## 2024-05-20 NOTE — Telephone Encounter (Signed)
 GI referral faxed to [facility]. Re: IDA  Fax confirmation received.

## 2024-05-29 ENCOUNTER — Inpatient Hospital Stay: Attending: Internal Medicine

## 2024-05-29 VITALS — BP 180/79 | HR 70 | Temp 98.2°F | Resp 16

## 2024-05-29 DIAGNOSIS — D509 Iron deficiency anemia, unspecified: Secondary | ICD-10-CM | POA: Diagnosis present

## 2024-05-29 DIAGNOSIS — Z9884 Bariatric surgery status: Secondary | ICD-10-CM | POA: Diagnosis not present

## 2024-05-29 DIAGNOSIS — Z903 Acquired absence of stomach [part of]: Secondary | ICD-10-CM

## 2024-05-29 MED ORDER — IRON SUCROSE 20 MG/ML IV SOLN
200.0000 mg | Freq: Once | INTRAVENOUS | Status: AC
  Administered 2024-05-29: 200 mg via INTRAVENOUS

## 2024-05-29 NOTE — Patient Instructions (Signed)

## 2024-06-16 ENCOUNTER — Other Ambulatory Visit: Payer: Self-pay | Admitting: Family Medicine

## 2024-06-16 DIAGNOSIS — I10 Essential (primary) hypertension: Secondary | ICD-10-CM

## 2024-06-17 NOTE — Telephone Encounter (Signed)
 Requested Prescriptions  Refused Prescriptions Disp Refills   valsartan  (DIOVAN ) 320 MG tablet [Pharmacy Med Name: VALSARTAN  320MG  TABLETS] 90 tablet 3    Sig: TAKE 1 TABLET(320 MG) BY MOUTH DAILY     Cardiovascular:  Angiotensin Receptor Blockers Failed - 06/17/2024  3:59 PM      Failed - Last BP in normal range    BP Readings from Last 1 Encounters:  05/29/24 (!) 180/79         Failed - Valid encounter within last 6 months    Recent Outpatient Visits           2 months ago Annual physical exam   [facility] [place], Marsa PARAS, [place]              Passed - Cr in normal range and within 180 days    Creatinine  Date Value Ref Range Status  01/11/2021 54.2 20.0 - 300.0 mg/dL Final   Creat  Date Value Ref Range Status  03/14/2024 0.65 0.60 - 1.00 mg/dL Final   Creatinine, Urine  Date Value Ref Range Status  03/14/2024 60 20 - 275 mg/dL Final         Passed - K in normal range and within 180 days    Potassium  Date Value Ref Range Status  03/14/2024 3.7 3.5 - 5.3 mmol/L Final  07/24/2012 4.2 3.5 - 5.1 mmol/L Final         Passed - Patient is not pregnant

## 2024-07-16 ENCOUNTER — Encounter: Payer: Self-pay | Admitting: Gastroenterology

## 2024-07-16 NOTE — Anesthesia Preprocedure Evaluation (Addendum)
 Anesthesia Evaluation  Patient identified by MRN, date of birth, ID band Patient awake    Reviewed: Allergy & Precautions, H&P , NPO status , Patient's Chart, lab work & pertinent test results  History of Anesthesia Complications (+) PONV, Family history of anesthesia reaction and history of anesthetic complications  Airway Mallampati: III  TM Distance: >3 FB     Dental   Permanent lower bridge, has some other dental work, none in front teeth:   Pulmonary neg pulmonary ROS, sleep apnea , former smoker          Cardiovascular hypertension, negative cardio ROS      Neuro/Psych  PSYCHIATRIC DISORDERS Anxiety Depression     Neuromuscular disease negative neurological ROS  negative psych ROS   GI/Hepatic negative GI ROS, Neg liver ROS, hiatal hernia,GERD  ,,  Endo/Other  negative endocrine ROSdiabetes    Renal/GU negative Renal ROS  negative genitourinary   Musculoskeletal negative musculoskeletal ROS (+) Arthritis ,    Abdominal   Peds negative pediatric ROS (+)  Hematology negative hematology ROS (+) Blood dyscrasia, anemia   Anesthesia Other Findings   Medical History  Sleep apnea, cannot handle CPAP machine, accidentally removes that at night Chiari malformation type I (HCC) Hypertension  Hyperlipidemia Arthritis  GERD (gastroesophageal reflux disease) Spinal stenosis  Small intestinal bacterial overgrowth Advanced care planning/counseling discussion  Vaccine reaction, initial encounter PONV (postoperative nausea and vomiting)  Seasonal allergies Anemia  Iron  deficiency anemia Restless legs  Irritable bowel syndrome with diarrhea Osteoarthritis of left knee  Family history of adverse reaction to anesthesia daughter has PONV Anxiety  Depression    Reproductive/Obstetrics negative OB ROS                              Anesthesia Physical Anesthesia Plan  ASA:  3  Anesthesia Plan: General   Post-op Pain Management:    Induction: Intravenous  PONV Risk Score and Plan:   Airway Management Planned: Natural Airway and Nasal Cannula  Additional Equipment:   Intra-op Plan:   Post-operative Plan:   Informed Consent: I have reviewed the patients History and Physical, chart, labs and discussed the procedure including the risks, benefits and alternatives for the proposed anesthesia with the patient or authorized representative who has indicated his/her understanding and acceptance.     Dental Advisory Given  Plan Discussed with: Anesthesiologist, CRNA and Surgeon  Anesthesia Plan Comments: (Patient consented for risks of anesthesia including but not limited to:  - adverse reactions to medications - risk of airway placement if required - damage to eyes, teeth, lips or other oral mucosa - nerve damage due to positioning  - sore throat or hoarseness - Damage to heart, brain, nerves, lungs, other parts of body or loss of life  Patient voiced understanding and assent.)         Anesthesia Quick Evaluation

## 2024-07-17 ENCOUNTER — Ambulatory Visit: Payer: Self-pay

## 2024-07-17 NOTE — Telephone Encounter (Signed)
 FYI Only or Action Required?: FYI only for provider.  Patient was last seen in primary care on 03/24/2024 by Edman Marsa PARAS, DO.  Called Nurse Triage reporting Hypertension.  Symptoms began several weeks ago.  Interventions attempted: Prescription medications: Taking prescribed blood pressure medication.  Symptoms are: unchanged.  Triage Disposition: See PCP When Office is Open (Within 3 Days)  Patient/caregiver understands and will follow disposition?: Yes      Copied from CRM [id number]. Topic: Clinical - Red Word Triage >> Jul 17, 2024  4:05 PM Marissa P wrote: Red Word that prompted transfer to Nurse Triage: Patients blood pressure has been very high she stated, went to eye doctor saw some slight hemorrhaging in her eye from it being high most likely according to the eye doctor. Eye doctor suggested she get an appt with pcp and she has an follow up appt with the eye doctor in about 2 months from now.       Reason for Disposition  Systolic BP >= 160 OR Diastolic >= 100  Answer Assessment - Initial Assessment Questions 1. BLOOD PRESSURE: What is your blood pressure? Did you take at least two measurements 5 minutes apart?     170's systolic  2. ONSET: When did you take your blood pressure?     Has been high since May 3. HOW: How did you take your blood pressure? (e.g., automatic home BP monitor, visiting nurse)     Has not checked it today   4. HISTORY: Do you have a history of high blood pressure?     Yes 5. MEDICINES: Are you taking any medicines for blood pressure? Have you missed any doses recently?     Has not missed any doses  6. OTHER SYMPTOMS: Do you have any symptoms? (e.g., blurred vision, chest pain, difficulty breathing, headache, weakness)     Slight hemorrhage in one eye that eye doctor noticed today  Protocols used: Blood Pressure - High-A-AH

## 2024-07-18 ENCOUNTER — Ambulatory Visit: Admitting: Internal Medicine

## 2024-07-18 ENCOUNTER — Encounter: Payer: Self-pay | Admitting: Internal Medicine

## 2024-07-18 VITALS — BP 168/80 | Ht 62.0 in | Wt 161.4 lb

## 2024-07-18 DIAGNOSIS — I10 Essential (primary) hypertension: Secondary | ICD-10-CM | POA: Diagnosis not present

## 2024-07-18 DIAGNOSIS — K529 Noninfective gastroenteritis and colitis, unspecified: Secondary | ICD-10-CM | POA: Diagnosis not present

## 2024-07-18 DIAGNOSIS — R634 Abnormal weight loss: Secondary | ICD-10-CM | POA: Diagnosis not present

## 2024-07-18 MED ORDER — CARVEDILOL 12.5 MG PO TABS: 12.5000 mg | ORAL_TABLET | Freq: Two times a day (BID) | ORAL | 0 refills | Status: DC

## 2024-07-18 NOTE — Patient Instructions (Signed)
 Hypertension, Adult Hypertension is another name for high blood pressure. High blood pressure forces your heart to work harder to pump blood. This can cause problems over time. There are two numbers in a blood pressure reading. There is a top number (systolic) over a bottom number (diastolic). It is best to have a blood pressure that is below 120/80. What are the causes? The cause of this condition is not known. Some other conditions can lead to high blood pressure. What increases the risk? Some lifestyle factors can make you more likely to develop high blood pressure: Smoking. Not getting enough exercise or physical activity. Being overweight. Having too much fat, sugar, calories, or salt (sodium) in your diet. Drinking too much alcohol. Other risk factors include: Having any of these conditions: Heart disease. Diabetes. High cholesterol. Kidney disease. Obstructive sleep apnea. Having a family history of high blood pressure and high cholesterol. Age. The risk increases with age. Stress. What are the signs or symptoms? High blood pressure may not cause symptoms. Very high blood pressure (hypertensive crisis) may cause: Headache. Fast or uneven heartbeats (palpitations). Shortness of breath. Nosebleed. Vomiting or feeling like you may vomit (nauseous). Changes in how you see. Very bad chest pain. Feeling dizzy. Seizures. How is this treated? This condition is treated by making healthy lifestyle changes, such as: Eating healthy foods. Exercising more. Drinking less alcohol. Your doctor may prescribe medicine if lifestyle changes do not help enough and if: Your top number is above 130. Your bottom number is above 80. Your personal target blood pressure may vary. Follow these instructions at home: Eating and drinking  If told, follow the DASH eating plan. To follow this plan: Fill one half of your plate at each meal with fruits and vegetables. Fill one fourth of your plate  at each meal with whole grains. Whole grains include whole-wheat pasta, brown rice, and whole-grain bread. Eat or drink low-fat dairy products, such as skim milk or low-fat yogurt. Fill one fourth of your plate at each meal with low-fat (lean) proteins. Low-fat proteins include fish, chicken without skin, eggs, beans, and tofu. Avoid fatty meat, cured and processed meat, or chicken with skin. Avoid pre-made or processed food. Limit the amount of salt in your diet to less than 1,500 mg each day. Do not drink alcohol if: Your doctor tells you not to drink. You are pregnant, may be pregnant, or are planning to become pregnant. If you drink alcohol: Limit how much you have to: 0-1 drink a day for women. 0-2 drinks a day for men. Know how much alcohol is in your drink. In the [place] one drink equals one 12 oz bottle of beer (355 mL), one 5 oz glass of wine (148 mL), or one 1 oz glass of hard liquor (44 mL). Lifestyle  Work with your doctor to stay at a healthy weight or to lose weight. Ask your doctor what the best weight is for you. Get at least 30 minutes of exercise that causes your heart to beat faster (aerobic exercise) most days of the week. This may include walking, swimming, or biking. Get at least 30 minutes of exercise that strengthens your muscles (resistance exercise) at least 3 days a week. This may include lifting weights or doing Pilates. Do not smoke or use any products that contain nicotine or tobacco. If you need help quitting, ask your doctor. Check your blood pressure at home as told by your doctor. Keep all follow-up visits. Medicines Take over-the-counter and prescription medicines  only as told by your doctor. Follow directions carefully. Do not skip doses of blood pressure medicine. The medicine does not work as well if you skip doses. Skipping doses also puts you at risk for problems. Ask your doctor about side effects or reactions to medicines that you should watch  for. Contact a doctor if: You think you are having a reaction to the medicine you are taking. You have headaches that keep coming back. You feel dizzy. You have swelling in your ankles. You have trouble with your vision. Get help right away if: You get a very bad headache. You start to feel mixed up (confused). You feel weak or numb. You feel faint. You have very bad pain in your: Chest. Belly (abdomen). You vomit more than once. You have trouble breathing. These symptoms may be an emergency. Get help right away. Call 911. Do not wait to see if the symptoms will go away. Do not drive yourself to the hospital. Summary Hypertension is another name for high blood pressure. High blood pressure forces your heart to work harder to pump blood. For most people, a normal blood pressure is less than 120/80. Making healthy choices can help lower blood pressure. If your blood pressure does not get lower with healthy choices, you may need to take medicine. This information is not intended to replace advice given to you by your health care provider. Make sure you discuss any questions you have with your health care provider. Document Revised: 09/22/2021 Document Reviewed: 09/22/2021 Elsevier Patient Education  2024 [employer].

## 2024-07-18 NOTE — Progress Notes (Signed)
 Subjective:    Patient ID: Amanda Wang, female    DOB: [birth year], 73 y.o.   MRN: [id number]  HPI  Discussed the use of AI scribe software for clinical note transcription with the patient, who gave verbal consent to proceed.  Amanda Wang Bev is a 73 year old female with hypertension who presents with concerns about elevated blood pressure and severe diarrhea.  Her blood pressure has been elevated for several months, with readings in the 170s/90s range, confirmed by home monitoring. Today, her blood pressure is 160/80. She is currently taking valsartan  320 mg daily and carvedilol  6.25 mg twice a day. She has previously been on amlodipine , which was discontinued due to bradycardia, and benazepril , which caused peripheral edema. No chest pain, shortness of breath, or peripheral edema. No headaches, lightheadedness, or vision changes related to her hypertension. She mentions having small retinal hemorrhages on recent ophthalmology evaluation.  She experiences severe diarrhea over the past few months, resulting in a 20-pound weight loss. The diarrhea is urgent and uncontrollable, necessitating the use of diapers. She is scheduled for a colonoscopy next week.    Review of Systems   Past Medical History:  Diagnosis Date   Advanced care planning/counseling discussion 09/16/2018   Anemia    Anxiety    Arthritis    Chiari malformation type I (HCC)    Family history of adverse reaction to anesthesia    daughter PONV   GERD (gastroesophageal reflux disease)    Hyperlipidemia    Hypertension    Iron  deficiency anemia    Irritable bowel syndrome with diarrhea    Osteoarthritis of left knee    PONV (postoperative nausea and vomiting)    Restless legs    Seasonal allergies    Sleep apnea    no Cpap use   Small intestinal bacterial overgrowth    Spinal stenosis    Vaccine reaction, initial encounter 03/11/2020    Current Outpatient Medications  Medication Sig Dispense Refill    ACCU-CHEK GUIDE test strip Use to check blood sugar 1 x daily as instructed (Patient not taking: Reported on 07/16/2024) 100 strip 3   Accu-Chek Softclix Lancets lancets TEST TWO TO THREE TIMES DAILY DX E11.69 (Patient not taking: Reported on 07/16/2024) 300 each 2   albuterol  (VENTOLIN  HFA) 108 (90 Base) MCG/ACT inhaler Inhale 2 puffs into the lungs every 4 (four) hours as needed for wheezing or shortness of breath. 8 g 2   atorvastatin  (LIPITOR) 40 MG tablet Take 1 tablet (40 mg total) by mouth daily. 90 tablet 3   azelastine  (ASTELIN ) 0.1 % nasal spray Place 1 spray into both nostrils 2 (two) times daily. (Patient not taking: Reported on 07/16/2024)     Blood Glucose Monitoring Suppl (ACCU-CHEK GUIDE) w/Device KIT 1 Device by Does not apply route 4 (four) times daily. DX E11.69 (Patient not taking: Reported on 07/16/2024) 1 kit 0   Carboxymethylcellul-Glycerin (LUBRICATING EYE DROPS OP) Place 1 drop into both eyes daily as needed (dry eyes). (Patient not taking: Reported on 07/16/2024)     carvedilol  (COREG ) 6.25 MG tablet Take 1 tablet (6.25 mg total) by mouth 2 (two) times daily with a meal. 180 tablet 3   Cholecalciferol (VITAMIN D -3 PO) Take 1 tablet by mouth 1 day or 1 dose.     cyclobenzaprine  (FLEXERIL ) 10 MG tablet Take 1 tablet (10 mg total) by mouth 3 (three) times daily as needed for muscle spasms. 90 tablet 3   levETIRAcetam  (KEPPRA ) 500 MG  tablet Take 1 tablet (500 mg total) by mouth 2 (two) times daily. 180 tablet 3   LORazepam  (ATIVAN ) 1 MG tablet Take 0.5-1 tablets (0.5-1 mg total) by mouth daily as needed for anxiety. 30 tablet 2   omeprazole  (PRILOSEC) 40 MG capsule Take 1 capsule (40 mg total) by mouth daily. 90 capsule 3   Spacer/Aero-Holding Chambers (AEROCHAMBER PLUS WITH MASK) inhaler Use with inhaler as needed. 1 each 0   valsartan  (DIOVAN ) 320 MG tablet Take 1 tablet (320 mg total) by mouth daily. 90 tablet 3   Vilazodone  HCl (VIIBRYD ) 40 MG TABS Take 1 tablet (40 mg total)  by mouth daily. 90 tablet 1   No current facility-administered medications for this visit.    Allergies  Allergen Reactions   Fexofenadine Anxiety    Makes her have a panic attack   Amlodipine  Swelling   Codeine  Nausea Only    Dizziness   Methylphenidate Hcl     Caused a seizure   Zoloft [Sertraline] Other (See Comments)    Sleepiness   Magnevist [Gadopentetate] Hives and Itching    IV Contrast   Sulfa Antibiotics Rash    Family History  Problem Relation Age of Onset   Hypertension Mother    Cancer Mother    Stroke Mother    Heart disease Father    Breast cancer Sister 42   Breast cancer Paternal Aunt    Diabetes Paternal Grandmother     Social History   Socioeconomic History   Marital status: Divorced    Spouse name: Not on file   Number of children: 2   Years of education: Not on file   Highest education level: Bachelor's degree (e.g., BA, AB, BS)  Occupational History   Occupation: part time   Tobacco Use   Smoking status: Former    Current packs/day: 0.00    Average packs/day: 0.5 packs/day for 1 year (0.5 ttl pk-yrs)    Types: Cigarettes    Start date: 07/26/1976    Quit date: 07/26/1977    Years since quitting: 47.0    Passive exposure: Never   Smokeless tobacco: Never   Tobacco comments:    barley smoked when smoked   Vaping Use   Vaping status: Never Used  Substance and Sexual Activity   Alcohol  use: Yes    Alcohol /week: 0.0 standard drinks of alcohol     Comment: on occasion 1-2 x month   Drug use: No   Sexual activity: Yes  Other Topics Concern   Not on file  Social History Narrative   Lives alone.   Social Drivers of Corporate investment banker Strain: Low Risk  (06/09/2024)   Received from [facility]   Overall Financial Resource Strain (CARDIA)    Difficulty of Paying Living Expenses: Not hard at all  Food Insecurity: No Food Insecurity (06/09/2024)   Received from [facility]   Hunger Vital Sign     Within the past 12 months, you worried that your food would run out before you got the money to buy more.: Never true    Within the past 12 months, the food you bought just didn't last and you didn't have money to get more.: Never true  Transportation Needs: No Transportation Needs (06/09/2024)   Received from [facility] - Transportation    In the past 12 months, has lack of transportation kept you from medical appointments or from getting medications?: No  Lack of Transportation (Non-Medical): No  Physical Activity: Inactive (10/17/2021)   Exercise Vital Sign    Days of Exercise per Week: 0 days    Minutes of Exercise per Session: 0 min  Stress: Stress Concern Present (10/17/2021)   [employer] of Occupational Health - Occupational Stress Questionnaire    Feeling of Stress : To some extent  Social Connections: Moderately Integrated (09/22/2019)   Social Connection and Isolation Panel    Frequency of Communication with Friends and Family: More than three times a week    Frequency of Social Gatherings with Friends and Family: More than three times a week    Attends Religious Services: More than 4 times per year    Active Member of [employer] or Organizations: Yes    Attends Banker Meetings: More than 4 times per year    Marital Status: Divorced  Intimate Partner Violence: Not At Risk (06/10/2023)   Humiliation, Afraid, Rape, and Kick questionnaire    Fear of Current or Ex-Partner: No    Emotionally Abused: No    Physically Abused: No    Sexually Abused: No     Constitutional: Pt reports unintentional weight loss. Denies fever, malaise, fatigue, headache.  Respiratory: Denies difficulty breathing, shortness of breath, cough or sputum production.   Cardiovascular: Denies chest pain, chest tightness, palpitations or swelling in the hands or feet.  Gastrointestinal: Pt reports chronic diarrhea. Denies abdominal pain, bloating, constipation, or  blood in the stool.  Musculoskeletal: Denies decrease in range of motion, difficulty with gait, muscle pain or joint pain and swelling.  Neurological: Denies dizziness, difficulty with memory, difficulty with speech or problems with balance and coordination.   No other specific complaints in a complete review of systems (except as listed in HPI above).      Objective:   Physical Exam BP (!) 168/80   Ht 5' 2 (1.575 m)   Wt 161 lb 6.4 oz (73.2 kg)   BMI 29.52 kg/m   Wt Readings from Last 3 Encounters:  04/08/24 172 lb (78 kg)  03/24/24 172 lb (78 kg)  01/10/24 168 lb (76.2 kg)    General: Appears her stated age, overweight, in NAD. HEENT: Head: normal shape and size; Eyes: sclera white, no icterus, conjunctiva pink, PERRLA and EOMs intact;  Cardiovascular: Normal rate and rhythm. S1,S2 noted.  No murmur, rubs or gallops noted. No JVD or BLE edema.  Pulmonary/Chest: Normal effort and positive vesicular breath sounds. No respiratory distress. No wheezes, rales or ronchi noted.  Musculoskeletal: No difficulty with gait.  Neurological: Alert and oriented. Coordination normal.    BMET    Component Value Date/Time   NA 143 03/14/2024 0907   NA 143 04/25/2022 1625   NA 142 07/24/2012 0928   K 3.7 03/14/2024 0907   K 4.2 07/24/2012 0928   CL 103 03/14/2024 0907   CL 104 07/24/2012 0928   CO2 31 03/14/2024 0907   CO2 33 (H) 07/24/2012 0928   GLUCOSE 139 (H) 03/14/2024 0907   GLUCOSE 117 (H) 07/24/2012 0928   BUN 15 03/14/2024 0907   BUN 22 04/25/2022 1625   BUN 17 07/24/2012 0928   CREATININE 0.65 03/14/2024 0907   CALCIUM  8.7 03/14/2024 0907   CALCIUM  8.5 07/24/2012 0928   GFRNONAA >60 06/11/2023 0535   GFRNONAA >60 07/24/2012 0928   GFRAA 96 01/11/2021 1132   GFRAA >60 07/24/2012 0928    Lipid Panel     Component Value Date/Time   CHOL 156 03/14/2024  0907   CHOL 158 05/31/2021 1138   CHOL 175 03/07/2018 1419   TRIG 130 03/14/2024 0907   TRIG 164 (H) 03/07/2018  1419   HDL 52 03/14/2024 0907   HDL 62 05/31/2021 1138   CHOLHDL 3.0 03/14/2024 0907   VLDL 33 (H) 03/07/2018 1419   LDLCALC 81 03/14/2024 0907    CBC    Component Value Date/Time   WBC 7.4 04/08/2024 1458   WBC 6.4 03/14/2024 0907   RBC 3.94 04/08/2024 1458   HGB 10.5 (L) 04/08/2024 1458   HGB 12.2 12/30/2021 1146   HCT 33.4 (L) 04/08/2024 1458   HCT 38.0 12/30/2021 1146   PLT 272 04/08/2024 1458   PLT 431 12/30/2021 1146   MCV 84.8 04/08/2024 1458   MCV 79 12/30/2021 1146   MCV 86 07/24/2012 0928   MCH 26.6 04/08/2024 1458   MCHC 31.4 04/08/2024 1458   RDW 15.3 04/08/2024 1458   RDW 16.8 (H) 12/30/2021 1146   RDW 14.6 (H) 07/24/2012 0928   LYMPHSABS 1.3 04/08/2024 1458   LYMPHSABS 1.3 12/30/2021 1146   LYMPHSABS 1.2 07/24/2012 0928   MONOABS 0.4 04/08/2024 1458   MONOABS 0.4 07/24/2012 0928   EOSABS 0.3 04/08/2024 1458   EOSABS 0.1 12/30/2021 1146   EOSABS 0.2 07/24/2012 0928   BASOSABS 0.0 04/08/2024 1458   BASOSABS 0.0 12/30/2021 1146   BASOSABS 0.0 07/24/2012 0928    Hgb A1C Lab Results  Component Value Date   HGBA1C 7.3 (H) 03/14/2024            Assessment & Plan:   Assessment and Plan    Chronic diarrhea, unintentional weight loss Severe diarrhea with significant weight loss.  - Proceed with scheduled colonoscopy under Doctor Therisa.  Hypertension Hypertension with recent readings in the 170s/90s, currently 160/80. No symptoms. Current medications include valsartan  and carvedilol . Previous medications discontinued due to side effects. - Continue valsartan  320 mg daily. - Increase carvedilol  to 12.5 mg twice daily. Take two 6.25 mg tablets twice daily until new prescription is filled. - Follow up with Doctor K in August to reassess blood pressure.       Follow-up with your PCP as previously scheduled Angeline Laura, NP

## 2024-07-18 NOTE — Telephone Encounter (Signed)
 Will discuss at upcoming appt.

## 2024-07-25 ENCOUNTER — Other Ambulatory Visit: Payer: Self-pay

## 2024-07-25 ENCOUNTER — Ambulatory Visit: Payer: Self-pay | Admitting: Anesthesiology

## 2024-07-25 ENCOUNTER — Ambulatory Visit
Admission: RE | Admit: 2024-07-25 | Discharge: 2024-07-25 | Disposition: A | Discharge status: Home / Self Care | Attending: Gastroenterology | Admitting: Gastroenterology

## 2024-07-25 ENCOUNTER — Encounter: Payer: Self-pay | Admitting: Gastroenterology

## 2024-07-25 ENCOUNTER — Encounter
Admission: RE | Disposition: A | Discharge status: Home / Self Care | Payer: Self-pay | Source: Home / Self Care | Attending: Gastroenterology

## 2024-07-25 DIAGNOSIS — E785 Hyperlipidemia, unspecified: Secondary | ICD-10-CM | POA: Insufficient documentation

## 2024-07-25 DIAGNOSIS — K219 Gastro-esophageal reflux disease without esophagitis: Secondary | ICD-10-CM | POA: Diagnosis not present

## 2024-07-25 DIAGNOSIS — G4730 Sleep apnea, unspecified: Secondary | ICD-10-CM | POA: Diagnosis not present

## 2024-07-25 DIAGNOSIS — Z87891 Personal history of nicotine dependence: Secondary | ICD-10-CM | POA: Insufficient documentation

## 2024-07-25 DIAGNOSIS — K5289 Other specified noninfective gastroenteritis and colitis: Secondary | ICD-10-CM | POA: Diagnosis not present

## 2024-07-25 DIAGNOSIS — I10 Essential (primary) hypertension: Secondary | ICD-10-CM | POA: Insufficient documentation

## 2024-07-25 DIAGNOSIS — K529 Noninfective gastroenteritis and colitis, unspecified: Secondary | ICD-10-CM | POA: Insufficient documentation

## 2024-07-25 HISTORY — DX: Family history of other specified conditions: Z84.89

## 2024-07-25 HISTORY — DX: Anxiety disorder, unspecified: F41.9

## 2024-07-25 SURGERY — COLONOSCOPY
Anesthesia: General | Site: Rectum

## 2024-07-25 MED ORDER — PROPOFOL 1000 MG/100ML IV EMUL
INTRAVENOUS | Status: AC
  Filled 2024-07-25: qty 100

## 2024-07-25 MED ORDER — ONDANSETRON HCL 4 MG/2ML IJ SOLN
INTRAMUSCULAR | Status: DC | PRN
  Administered 2024-07-25: 4 mg via INTRAVENOUS

## 2024-07-25 MED ORDER — SODIUM CHLORIDE 0.9 % IV SOLN: INTRAVENOUS | Status: DC

## 2024-07-25 MED ORDER — PROPOFOL 500 MG/50ML IV EMUL
INTRAVENOUS | Status: DC | PRN
Start: 2024-07-25 — End: 2024-07-25
  Administered 2024-07-25: 120 ug/kg/min via INTRAVENOUS

## 2024-07-25 MED ORDER — SEVOFLURANE IN SOLN
RESPIRATORY_TRACT | Status: AC
  Filled 2024-07-25: qty 250

## 2024-07-25 MED ORDER — LIDOCAINE HCL (CARDIAC) PF 100 MG/5ML IV SOSY
PREFILLED_SYRINGE | INTRAVENOUS | Status: DC | PRN
  Administered 2024-07-25: 60 mg via INTRAVENOUS

## 2024-07-25 MED ORDER — STERILE WATER FOR IRRIGATION IR SOLN
Status: DC | PRN
Start: 2024-07-25 — End: 2024-07-25
  Administered 2024-07-25: 1

## 2024-07-25 MED ORDER — PROPOFOL 10 MG/ML IV BOLUS
INTRAVENOUS | Status: DC | PRN
  Administered 2024-07-25 (×2): 50 mg via INTRAVENOUS

## 2024-07-25 MED ORDER — LACTATED RINGERS IV SOLN: INTRAVENOUS | Status: DC

## 2024-07-25 SURGICAL SUPPLY — 14 items
CLIP HMST 235XBRD CATH ROT (MISCELLANEOUS) IMPLANT
ELECTRODE REM PT RTRN 9FT ADLT (ELECTROSURGICAL) IMPLANT
FORCEPS BIOP RAD 4 LRG CAP 4 (CUTTING FORCEPS) IMPLANT
GAUZE SPONGE 4X4 12PLY STRL (GAUZE/BANDAGES/DRESSINGS) IMPLANT
GOWN CVR UNV OPN BCK APRN NK (MISCELLANEOUS) ×2 IMPLANT
INJECTOR VARIJECT VIN23 (MISCELLANEOUS) IMPLANT
KIT DEFENDO VALVE AND CONN (KITS) IMPLANT
KIT PRC NS LF DISP ENDO (KITS) ×1 IMPLANT
MANIFOLD NEPTUNE II (INSTRUMENTS) IMPLANT
MARKER SPOT ENDO TATTOO 5ML (MISCELLANEOUS) IMPLANT
PROBE APC STR FIRE (PROBE) IMPLANT
RETRIEVER NET ROTH 2.5X230 LF (MISCELLANEOUS) IMPLANT
SNARE COLD EXACTO (MISCELLANEOUS) IMPLANT
WATER STERILE IRR 250ML POUR (IV SOLUTION) ×1 IMPLANT

## 2024-07-25 NOTE — Transfer of Care (Signed)
 Immediate Anesthesia Transfer of Care Note  Patient: Amanda Wang  Procedure(s) Performed: COLONOSCOPY (Rectum)  Patient Location: PACU  Anesthesia Type: General  Level of Consciousness: awake, alert  and patient cooperative  Airway and Oxygen Therapy: Patient Spontanous Breathing and Patient connected to supplemental oxygen  Post-op Assessment: Post-op Vital signs reviewed, Patient's Cardiovascular Status Stable, Respiratory Function Stable, Patent Airway and No signs of Nausea or vomiting  Post-op Vital Signs: Reviewed and stable  Complications: No notable events documented.

## 2024-07-25 NOTE — Op Note (Signed)
 [facility]enter[facility] Patient Name: Amanda Wang Procedure Date: 07/25/2024 7:12 AM MRN: [id number] Account #: [id number] Date of Birth: November 25, 1951 Admit Type: Outpatient Age: 73 Room: [facility] OR ROOM 01 Gender: Female Note Status: Finalized Instrument Name: Arvis [id number] Procedure:             Colonoscopy Indications:           Chronic diarrhea Providers:             Ruel Kung MD, MD Referring MD:          Marsa DOROTHA Officer (Referring MD) Medicines:             Monitored Anesthesia Care Complications:         No immediate complications. Procedure:             Pre-Anesthesia Assessment:                        - Prior to the procedure, a History and Physical was                         performed, and patient medications, allergies and                         sensitivities were reviewed. The patient's tolerance                         of previous anesthesia was reviewed.                        - The risks and benefits of the procedure and the                         sedation options and risks were discussed with the                         patient. All questions were answered and informed                         consent was obtained.                        - ASA Grade Assessment: II - A patient with mild                         systemic disease.                        After obtaining informed consent, the colonoscope was                         passed under direct vision. Throughout the procedure,                         the patient's blood pressure, pulse, and oxygen                         saturations were monitored continuously. The                         Colonoscope was introduced through the anus  and                         advanced to the the terminal ileum. The colonoscopy                         was performed with ease. The patient tolerated the                         procedure well. The quality of the bowel preparation                          was excellent. The terminal ileum, ileocecal valve,                         appendiceal orifice, and rectum were photographed. Findings:      The perianal and digital rectal examinations were normal.      The terminal ileum appeared normal. Biopsies were taken with a cold       forceps for histology.      The colon (entire examined portion) appeared normal. Biopsies were taken       with a cold forceps for histology.      The exam was otherwise without abnormality on direct and retroflexion       views. Impression:            - The examined portion of the ileum was normal.                         Biopsied.                        - The entire examined colon is normal. Biopsied.                        - The examination was otherwise normal on direct and                         retroflexion views. Recommendation:        - Discharge patient to home (with escort).                        - Resume previous diet.                        - Continue present medications.                        - Await pathology results.                        - Repeat colonoscopy is not recommended due to current                         age (10 years or older) for screening purposes.                        - Return to GI office as previously scheduled. Procedure Code(s):     --- Professional ---  54619, Colonoscopy, flexible; with biopsy, single or                         multiple Diagnosis Code(s):     --- Professional ---                        K52.9, Noninfective gastroenteritis and colitis,                         unspecified CPT copyright 2022 American Medical Association. All rights reserved. The codes documented in this report are preliminary and upon coder review may  be revised to meet current compliance requirements. Ruel Kung, MD Ruel Kung MD, MD 07/25/2024 7:58:40 AM This report has been signed electronically. Number of Addenda: 0 Note Initiated On: 07/25/2024 7:12  AM Scope Withdrawal Time: 0 hours 10 minutes 56 seconds  Total Procedure Duration: 0 hours 15 minutes 46 seconds  Estimated Blood Loss:  Estimated blood loss: none.      [facility]

## 2024-07-25 NOTE — Anesthesia Postprocedure Evaluation (Signed)
 Anesthesia Post Note  Patient: LEEAH POLITANO  Procedure(s) Performed: COLONOSCOPY (Rectum)  Patient location during evaluation: PACU Anesthesia Type: General Level of consciousness: awake and alert Pain management: pain level controlled Vital Signs Assessment: post-procedure vital signs reviewed and stable Respiratory status: spontaneous breathing, nonlabored ventilation, respiratory function stable and patient connected to nasal cannula oxygen Cardiovascular status: blood pressure returned to baseline and stable Postop Assessment: no apparent nausea or vomiting Anesthetic complications: no   No notable events documented.   Last Vitals:  Vitals:   07/25/24 0803 07/25/24 0815  BP: 110/61 (!) 150/77  Pulse:  (!) 57  Resp: 19 18  Temp:    SpO2:  98%    Last Pain:  Vitals:   07/25/24 0815  TempSrc:   PainSc: 0-No pain                 Permelia Bamba C Laderius Valbuena

## 2024-07-25 NOTE — H&P (Signed)
 Ruel Kung , MD [address], [place], [place], [address] Phone: [phone] Fax: [phone]  Primary Care Physician:  Edman Marsa PARAS, DO   Pre-Procedure History & Physical: HPI:  Amanda Wang is a 73 y.o. female is here for an colonoscopy.   Past Medical History:  Diagnosis Date   Advanced care planning/counseling discussion 09/16/2018   Anemia    Anxiety    Arthritis    Chiari malformation type I (HCC)    Depression    Family history of adverse reaction to anesthesia    daughter PONV   GERD (gastroesophageal reflux disease)    Hyperlipidemia    Hypertension    Iron  deficiency anemia    Irritable bowel syndrome with diarrhea    Osteoarthritis of left knee    PONV (postoperative nausea and vomiting)    Restless legs    Seasonal allergies    Sleep apnea    no Cpap use   Small intestinal bacterial overgrowth    Spinal stenosis    Vaccine reaction, initial encounter 03/11/2020    Past Surgical History:  Procedure Laterality Date   BRAIN SURGERY  09/2005   BREAST BIOPSY Right    neg   chiari malformation I decompression surgery  10/2005   COLONOSCOPY WITH PROPOFOL  N/A 11/28/2018   Procedure: COLONOSCOPY WITH PROPOFOL ;  Surgeon: Kung Ruel, MD;  Location: [facility] ENDOSCOPY;  Service: Gastroenterology;  Laterality: N/A;   COLONOSCOPY WITH PROPOFOL  N/A 10/27/2021   Procedure: COLONOSCOPY WITH PROPOFOL ;  Surgeon: Kung Ruel, MD;  Location: [facility] ENDOSCOPY;  Service: Gastroenterology;  Laterality: N/A;   ESOPHAGOGASTRODUODENOSCOPY N/A 10/27/2021   Procedure: ESOPHAGOGASTRODUODENOSCOPY (EGD);  Surgeon: Kung Ruel, MD;  Location: [facility] ENDOSCOPY;  Service: Gastroenterology;  Laterality: N/A;   HERNIA REPAIR     s/p gastric sleeve   KNEE ARTHROSCOPY WITH MEDIAL MENISECTOMY Left 01/12/2022   Procedure: KNEE ARTHROSCOPY WITH  MEDIAL MENISECTOMY;  Surgeon: Marchia Drivers, MD;  Location: [facility] ORS;  Service: Orthopedics;  Laterality: Left;   LAPAROSCOPIC GASTRIC SLEEVE RESECTION     LARYNGOSCOPY N/A 1995   LASIK Bilateral    NASAL SEPTUM SURGERY     ORIF TOE FRACTURE N/A    TOTAL KNEE ARTHROPLASTY Left 06/05/2023   Procedure: TOTAL KNEE ARTHROPLASTY;  Surgeon: Marchia Drivers, MD;  Location: [facility] ORS;  Service: Orthopedics;  Laterality: Left;    Prior to Admission medications   Medication Sig Start Date End Date Taking? Authorizing Provider  albuterol  (VENTOLIN  HFA) 108 (90 Base) MCG/ACT inhaler Inhale 2 puffs into the lungs every 4 (four) hours as needed for wheezing or shortness of breath. 01/10/24  Yes Karamalegos, Marsa PARAS, DO  atorvastatin  (LIPITOR) 40 MG tablet Take 1 tablet (40 mg total) by mouth daily. 04/25/24  Yes Karamalegos, Marsa PARAS, DO  Carboxymethylcellul-Glycerin (LUBRICATING EYE DROPS OP) Place 1 drop into both eyes daily as needed (dry eyes).   Yes [provider]  carvedilol  (COREG ) 12.5 MG tablet Take 1 tablet (12.5 mg total) by mouth 2 (two) times daily with a meal. 07/18/24  Yes Baity, Angeline ORN, NP  Cholecalciferol (VITAMIN D -3 PO) Take 1 tablet by mouth 1 day or 1 dose.   Yes [provider]  cyclobenzaprine  (FLEXERIL ) 10 MG tablet Take 1 tablet (10 mg total) by mouth 3 (three) times daily as needed for muscle spasms. 03/24/24  Yes Karamalegos, Marsa PARAS, DO  levETIRAcetam  (KEPPRA ) 500 MG tablet Take 1 tablet (500 mg total) by mouth 2 (two) times daily. 04/25/24  Yes Karamalegos, Marsa PARAS, DO  LORazepam  (ATIVAN ) 1 MG tablet Take 0.5-1 tablets (0.5-1 mg total) by mouth daily as needed for anxiety. 03/24/24  Yes Karamalegos, Marsa PARAS, DO  omeprazole  (PRILOSEC) 40 MG capsule Take 1 capsule (40 mg total) by mouth daily. 03/24/24  Yes Karamalegos, Marsa PARAS, DO  valsartan  (DIOVAN ) 320 MG tablet Take 1 tablet (320 mg total) by mouth daily. 03/24/24  Yes Karamalegos, Marsa PARAS, DO   Vilazodone  HCl (VIIBRYD ) 40 MG TABS Take 1 tablet (40 mg total) by mouth daily. 03/24/24 09/20/24 Yes Karamalegos, Marsa PARAS, DO  azelastine  (ASTELIN ) 0.1 % nasal spray Place 1 spray into both nostrils 2 (two) times daily. Patient taking differently: Place 1 spray into both nostrils as needed. 05/15/23   [provider]  Spacer/Aero-Holding Chambers (AEROCHAMBER PLUS WITH MASK) inhaler Use with inhaler as needed. 03/19/23   Edman Marsa PARAS, DO    Allergies as of 06/25/2024 - Review Complete 04/08/2024  Allergen Reaction Noted   Fexofenadine Anxiety 11/04/2016   Amlodipine  Swelling 08/21/2023   Codeine  Nausea Only 07/15/2015   Methylphenidate hcl  07/24/2018   Zoloft [sertraline] Other (See Comments) 07/15/2015   Magnevist [gadopentetate] Hives and Itching 07/27/2015   Sulfa antibiotics Rash 07/15/2015    Family History  Problem Relation Age of Onset   Hypertension Mother    Cancer Mother    Stroke Mother    Varicose Veins Mother    Heart disease Father    Breast cancer Sister 57   Breast cancer Paternal Aunt    Diabetes Paternal Grandmother    Cancer Paternal Grandmother    Cancer Paternal Aunt     Social History   Socioeconomic History   Marital status: Divorced    Spouse name: Not on file   Number of children: 2   Years of education: Not on file   Highest education level: Bachelor's degree (e.g., BA, AB, BS)  Occupational History   Occupation: part time   Tobacco Use   Smoking status: Former    Current packs/day: 0.00    Average packs/day: 0.5 packs/day for 1 year (0.5 ttl pk-yrs)    Types: Cigarettes    Start date: 07/26/1976    Quit date: 07/26/1977    Years since quitting: 47.0    Passive exposure: Never   Smokeless tobacco: Never   Tobacco comments:    barley smoked when smoked   Vaping Use   Vaping status: Never Used  Substance and Sexual Activity   Alcohol  use: Yes    Alcohol /week: 0.0 standard drinks of alcohol     Comment: on occasion 1-2  x month   Drug use: No   Sexual activity: Yes  Other Topics Concern   Not on file  Social History Narrative   Lives alone.   Social Drivers of Corporate investment banker Strain: Low Risk  (06/09/2024)   Received from [facility]   Overall Financial Resource Strain (CARDIA)    Difficulty of Paying Living Expenses: Not hard at all  Food Insecurity: No Food Insecurity (06/09/2024)   Received from [facility]   Hunger Vital Sign    Within the past 12 months, you worried that your food would run out before you got the money to buy more.: Never true    Within the past 12 months, the food you bought just didn't last and you didn't have money to get more.: Never true  Transportation Needs: No Transportation Needs (06/09/2024)   Received from  [facility]   PRAPARE - Transportation    In the past 12 months, has lack of transportation kept you from medical appointments or from getting medications?: No    Lack of Transportation (Non-Medical): No  Physical Activity: Inactive (10/17/2021)   Exercise Vital Sign    Days of Exercise per Week: 0 days    Minutes of Exercise per Session: 0 min  Stress: Stress Concern Present (10/17/2021)   [employer] of Occupational Health - Occupational Stress Questionnaire    Feeling of Stress : To some extent  Social Connections: Moderately Integrated (09/22/2019)   Social Connection and Isolation Panel    Frequency of Communication with Friends and Family: More than three times a week    Frequency of Social Gatherings with Friends and Family: More than three times a week    Attends Religious Services: More than 4 times per year    Active Member of [employer] or Organizations: Yes    Attends Engineer, structural: More than 4 times per year    Marital Status: Divorced  Intimate Partner Violence: Not At Risk (06/10/2023)   Humiliation, Afraid, Rape, and Kick questionnaire    Fear of Current or  Ex-Partner: No    Emotionally Abused: No    Physically Abused: No    Sexually Abused: No    Review of Systems: See HPI, otherwise negative ROS  Physical Exam: BP (!) 181/75   Pulse (!) 59   Temp (!) 97.5 F (36.4 C) (Temporal)   Resp 16   Ht 5' 2 (1.575 m)   Wt 73 kg   SpO2 97%   BMI 29.45 kg/m  General:   Alert,  pleasant and cooperative in NAD Head:  Normocephalic and atraumatic. Neck:  Supple; no masses or thyromegaly. Lungs:  Clear throughout to auscultation, normal respiratory effort.    Heart:  +S1, +S2, Regular rate and rhythm, No edema. Abdomen:  Soft, nontender and nondistended. Normal bowel sounds, without guarding, and without rebound.   Neurologic:  Alert and  oriented x4;  grossly normal neurologically.  Impression/Plan: CICELY ORTNER is here for an colonoscopy to be performed for diarrhea  Risks, benefits, limitations, and alternatives regarding  colonoscopy have been reviewed with the patient.  Questions have been answered.  All parties agreeable.   Ruel Kung, MD  07/25/2024, 7:28 AM

## 2024-07-28 ENCOUNTER — Ambulatory Visit: Payer: Self-pay | Admitting: *Deleted

## 2024-07-28 ENCOUNTER — Ambulatory Visit: Payer: Self-pay

## 2024-07-28 NOTE — Telephone Encounter (Signed)
 FYI Only or Action Required?: FYI only for provider.  Patient was last seen in primary care on 07/18/2024 by Antonette Angeline ORN, NP.  Called Nurse Triage reporting Hypertension.  Symptoms began several days ago.  Interventions attempted: Prescription medications: as prescribed.  Symptoms are: unchanged.  Triage Disposition: Go to ED Now (overriding See Physician Within 24 Hours)  Patient/caregiver understands and will follow disposition?:    Copied from CRM #[id number]. Topic: Clinical - Red Word Triage >> Jul 28, 2024  3:15 PM Rachelle R wrote: [facility] that prompted transfer to Nurse Triage: Patient called earlier in regards to her BP. Already triaged and noted by Amber on encounter to go to ED. Would like to discuss further. Just took it 179/79. Pulse is 71. Reason for Disposition  Systolic BP >= 180 OR Diastolic >= 110  Answer Assessment - Initial Assessment Questions 1. BLOOD PRESSURE: What is your blood pressure? Did you take at least two measurements 5 minutes apart?     181/79 2. ONSET: When did you take your blood pressure?     This morning 3. HOW: How did you take your blood pressure? (e.g., automatic home BP monitor, visiting nurse)      4. HISTORY: Do you have a history of high blood pressure?     Yes  5. MEDICINES: Are you taking any medicines for blood pressure? Have you missed any doses recently?     No  6. OTHER SYMPTOMS: Do you have any symptoms? (e.g., blurred vision, chest pain, difficulty breathing, headache, weakness)     Not at this time.   Additional info: Patient called back, she would like to know what they can do for her at the emergency room that cannot wait until tomorrow. Spoke with patient about the importance of getting blood pressure under control, worried for her health, and ER is most appropriate for her care today. She verbalized understanding, agrees to ER for evaluation. Would like to keep appointment with [facility] for follow  up.  Protocols used: Blood Pressure - High-A-AH

## 2024-07-28 NOTE — Telephone Encounter (Signed)
 LM for patient to call back. Patient needs to go to the ED per [facility]. Okay to advise if patient calls back.

## 2024-07-28 NOTE — Telephone Encounter (Signed)
 FYI Only or Action Required?: FYI only for provider.  Patient was last seen in primary care on 07/18/2024 by Antonette Angeline ORN, NP.  Called Nurse Triage reporting Hypertension.  Symptoms began several months ago.  Interventions attempted: Prescription medications: recent increase in BP medication dose- not helping.  Symptoms are: unchanged.  Triage Disposition: See HCP Within 4 Hours (Or PCP Triage)  Patient/caregiver understands and will follow disposition?: yes- scheduled out of disposition- due to late hour- patient is aware seek care for symptoms.  Reason for Disposition  [1] Systolic BP >= 200 OR Diastolic >= 120 AND [2] having NO cardiac or neurologic symptoms  Answer Assessment - Initial Assessment Questions 1. BLOOD PRESSURE: What is your blood pressure? Did you take at least two measurements 5 minutes apart?     207/81- 2:30,  205/85 P 71- 2:50 2. ONSET: When did you take your blood pressure?     Patient was seen in office and had increase in BP dose 3. HOW: How did you take your blood pressure? (e.g., automatic home BP monitor, visiting nurse)     Automatic- arm 4. HISTORY: Do you have a history of high blood pressure?     yes 5. MEDICINES: Are you taking any medicines for blood pressure? Have you missed any doses recently?     Recent change in dose of medication -carvedilol  (COREG ) 12.5 MG tablet to twice/day 6. OTHER SYMPTOMS: Do you have any symptoms? (e.g., blurred vision, chest pain, difficulty breathing, headache, weakness)     No  Patient reports she has had high BP for months- March/April- patient rports she had to get iron  transfusions and it ranged 175/180 then. Patient states her eye doctor advised she needed better control- she had vessels burst. Patient states it was high at colonoscopy. Patient states recent increase of carvedilol  has not helped. Patient reports no symptoms- she has been schedule for tomorrow am.  Protocols used: Blood Pressure -  High-A-AH  Copied from CRM [id number]. Topic: Clinical - Red Word Triage >> Jul 28, 2024  2:40 PM Yolanda T wrote: Red Word that prompted transfer to Nurse Triage: patient called stated her blood pressure is 207/81 but she does not have any symptoms. She said her medication was changed but it is not working

## 2024-07-29 ENCOUNTER — Encounter: Payer: Self-pay | Admitting: Family Medicine

## 2024-07-29 ENCOUNTER — Encounter (INDEPENDENT_AMBULATORY_CARE_PROVIDER_SITE_OTHER): Payer: Self-pay

## 2024-07-29 ENCOUNTER — Telehealth (INDEPENDENT_AMBULATORY_CARE_PROVIDER_SITE_OTHER): Payer: Self-pay | Admitting: Otolaryngology

## 2024-07-29 ENCOUNTER — Ambulatory Visit: Admitting: Internal Medicine

## 2024-07-29 ENCOUNTER — Ambulatory Visit: Admitting: Family Medicine

## 2024-07-29 VITALS — BP 150/70 | HR 78 | Ht 62.0 in | Wt 161.5 lb

## 2024-07-29 DIAGNOSIS — G4733 Obstructive sleep apnea (adult) (pediatric): Secondary | ICD-10-CM | POA: Diagnosis not present

## 2024-07-29 DIAGNOSIS — I10 Essential (primary) hypertension: Secondary | ICD-10-CM | POA: Diagnosis not present

## 2024-07-29 DIAGNOSIS — K529 Noninfective gastroenteritis and colitis, unspecified: Secondary | ICD-10-CM | POA: Diagnosis not present

## 2024-07-29 LAB — SURGICAL PATHOLOGY

## 2024-07-29 MED ORDER — AMLODIPINE BESYLATE 10 MG PO TABS: 10.0000 mg | ORAL_TABLET | Freq: Every day | ORAL | 0 refills | Status: DC

## 2024-07-29 NOTE — Telephone Encounter (Signed)
 Called patient per Dr. Vallery request to schedule an appt for an [facility].  LVM and sent MyChart msg.

## 2024-07-29 NOTE — Progress Notes (Signed)
 Subjective:    Patient ID: Rojelio JINNY Apgar, female    DOB: 09-16-1951, 73 y.o.   MRN: [id number]  SHAINDY READER is a 73 y.o. female presenting on 07/29/2024 for Medical Management of Chronic Issues, Hypertension, and Sleep Apnea   HPI  Discussed the use of AI scribe software for clinical note transcription with the patient, who gave verbal consent to proceed.  History of Present Illness   Sharmaine J Gillyard Bev is a 73 year old female with hypertension who presents with uncontrolled high blood pressure.  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 Without significant benefit - Currently taking carvedilol  12.5mg  TWICE A DAY and valsartan  320mg  daily  - Previously treated with hydrochlorothiazide , discontinued due to night cramps - Previously treated with amlodipine , discontinued due to ankle swelling  Obstructive sleep apnea symptoms - History of obstructive sleep apnea, previously tested and treated with CPAP - Frequently removes CPAP mask at night in past when used - No sleep study in the past 8-9 years - Daily fatigue attributed to sleep apnea - Suspects worsening sleep apnea contributing to uncontrolled hypertension  Chronic diarrhea - Chronic diarrhea with persistent  symptoms despite treatment with Xifaxan  and Imodium - Recent colonoscopy was normal; biopsies taken to evaluate for colonic wall inflammation - Significant impact on quality of life, including weight loss and need for protective garments due to urgency and frequency of bowel movements          07/29/2024    9:45 AM 07/18/2024   10:04 AM 03/24/2024    1:58 PM  Depression screen PHQ 2/9  Decreased Interest 1 1 1   Down, Depressed, Hopeless 0 1 1  PHQ - 2 Score 1 2 2   Altered sleeping 1 1 2   Tired, decreased energy 1 1 2   Change in appetite 0 1 2  Feeling bad or failure about yourself  0 0 1  Trouble concentrating 0 0 0  Moving slowly or fidgety/restless 0 0 0  Suicidal thoughts 0 0 0  PHQ-9 Score 3 5 9   Difficult doing work/chores Somewhat difficult Somewhat difficult Somewhat difficult       07/29/2024    9:45 AM 07/18/2024   10:08 AM 03/24/2024    1:58 PM 12/13/2023   11:04 AM  GAD 7 : Generalized Anxiety Score  Nervous, Anxious, on Edge 1 0 0 1  Control/stop worrying 0 1 0 1  Worry too much - different things 0 0 1 1  Trouble relaxing 0 0 0 0  Restless 0 0 0 0  Easily annoyed or irritable 1 1 1  0  Afraid - awful might happen 0 0 0 0  Total GAD 7 Score 2 2 2 3   Anxiety Difficulty Not difficult at all Not difficult at all Not difficult at all     Social History   Tobacco Use   Smoking status: Former  Current packs/day: 0.00    Average packs/day: 0.5 packs/day for 1 year (0.5 ttl pk-yrs)    Types: Cigarettes    Start date: 07/26/1976    Quit date: 07/26/1977    Years since quitting: 47.0    Passive exposure: Never   Smokeless tobacco: Never   Tobacco comments:    barley smoked when smoked   Vaping Use   Vaping status: Never Used  Substance Use Topics   Alcohol  use: Yes    Alcohol /week: 0.0 standard drinks of alcohol     Comment: on occasion 1-2 x month   Drug use: No    Review of Systems Per HPI unless specifically indicated above     Objective:    BP (!)  150/70 (BP Location: Left Arm, Cuff Size: Normal)   Pulse 78   Ht 5' 2 (1.575 m)   Wt 161 lb 8 oz (73.3 kg)   SpO2 98%   BMI 29.54 kg/m   Wt Readings from Last 3 Encounters:  07/29/24 161 lb 8 oz (73.3 kg)  07/25/24 161 lb (73 kg)  07/18/24 161 lb 6.4 oz (73.2 kg)    Physical Exam Vitals and nursing note reviewed.  Constitutional:      General: She is not in acute distress.    Appearance: She is well-developed. She is not diaphoretic.     Comments: Well-appearing, comfortable, cooperative  HENT:     Head: Normocephalic and atraumatic.  Eyes:     General:        Right eye: No discharge.        Left eye: No discharge.     Conjunctiva/sclera: Conjunctivae normal.  Neck:     Thyroid : No thyromegaly.  Cardiovascular:     Rate and Rhythm: Normal rate and regular rhythm.     Heart sounds: Normal heart sounds. No murmur heard. Pulmonary:     Effort: Pulmonary effort is normal. No respiratory distress.     Breath sounds: Normal breath sounds. No wheezing or rales.  Musculoskeletal:        General: Normal range of motion.     Cervical back: Normal range of motion and neck supple.  Lymphadenopathy:     Cervical: No cervical adenopathy.  Skin:    General: Skin is warm and dry.     Findings: No erythema or rash.  Neurological:     Mental Status: She is alert and oriented to person, place, and time.  Psychiatric:        Behavior: Behavior normal.     Comments: Well groomed, good eye contact, normal speech and thoughts     Results for orders placed or performed in visit on 04/08/24  CBC with Differential (Cancer Center Only)   Collection Time: 04/08/24  2:58 PM  Result Value Ref Range   WBC Count 7.4 4.0 - 10.5 K/uL   RBC 3.94 3.87 - 5.11 MIL/uL   Hemoglobin 10.5 (L) 12.0 - 15.0 g/dL   HCT 66.5 (L) 63.9 - 53.9 %   MCV 84.8 80.0 - 100.0 fL   MCH 26.6 26.0 - 34.0 pg   MCHC 31.4 30.0 - 36.0 g/dL   RDW 84.6 88.4 - 84.4 %   Platelet Count 272 150 - 400 K/uL   nRBC 0.0 0.0 -  0.2 %   Neutrophils Relative % 73 %   Neutro Abs 5.3 1.7 - 7.7 K/uL   Lymphocytes Relative 17 %   Lymphs Abs 1.3 0.7 - 4.0 K/uL   Monocytes Relative 6 %  Monocytes Absolute 0.4 0.1 - 1.0 K/uL   Eosinophils Relative 4 %   Eosinophils Absolute 0.3 0.0 - 0.5 K/uL   Basophils Relative 0 %   Basophils Absolute 0.0 0.0 - 0.1 K/uL   Immature Granulocytes 0 %   Abs Immature Granulocytes 0.03 0.00 - 0.07 K/uL  Iron  and TIBC   Collection Time: 04/08/24  2:58 PM  Result Value Ref Range   Iron  31 28 - 170 ug/dL   TIBC 692 749 - 549 ug/dL   Saturation Ratios 10 (L) 10.4 - 31.8 %   UIBC 276 ug/dL  Ferritin   Collection Time: 04/08/24  2:58 PM  Result Value Ref Range   Ferritin 20 11 - 307 ng/mL  Vitamin B12   Collection Time: 04/08/24  2:59 PM  Result Value Ref Range   Vitamin B-12 643 180 - 914 pg/mL      Assessment & Plan:   Problem List Items Addressed This Visit     Essential hypertension - Primary   Relevant Medications   amLODipine  (NORVASC ) 10 MG tablet   Obstructive sleep apnea   Other Visit Diagnoses       Chronic diarrhea            Resistant hypertension Hypertension remains uncontrolled despite maximum doses of valsartan  and carvedilol . Previous medications discontinued due to side effects. (Note Amlodipine  with swelling and hydrochlorothiazide  with muscle cramps) Probable secondary causes include obstructive sleep apnea. Non-compliance with CPAP may contribute.  Pursue OSA management, see below  - Restart amlodipine  10 mg, begin with half tablet daily, increase to 10 mg daily if needed. - Continue valsartan  320mg  and carvedilol  12.5mg  BID - Elevate legs, consider compression stockings if swelling occurs. - Monitor blood pressure, seek care if severe symptoms occur. - Consider [facility] referral if hypertension remains uncontrolled.  She is aware of return criteria if severe worsening BP and symptoms combined with headache, focal  neuro deficits, loss of vision, numbness tingling weakness, stroke symptoms, chest pain dyspnea syncope or other severe concerns, when to seek care at hospital ED if needed.  Obstructive sleep apnea persistent, uncontrolled Suspected recurrence or worsening, potentially contributing to resistant hypertension. Previous CPAP use ineffective due to non-compliance, intolerance to full facemask. Discussed alternative treatments such as Inspire implant and other mask / CPAP options  - Refer to [facility] for sleep evaluation and potential Inspire implant discussion. - Consider home sleep study if recommended by ENT.  Chronic diarrhea, under evaluation Chronic diarrhea with elevated fecal calprotectin, indicating possible inflammation. Recent colonoscopy showed no abnormalities, awaiting biopsy results on Colonoscopy. Previous treatment with Xifaxan  ineffective. - Await biopsy results from recent colonoscopy. - Follow up with gastroenterologist on August 18 for further management.       Discussed case with [facility] Dr Elena Larry on Epic Secure Chat. She agrees that patient would be candidate for [facility] and further evaluation of OSA and management. She agrees with referral and will arrange repeat sleep study through [facility] location and they will follow-up results and management discussion.  New referral placed today  [facility] [address] [place],  [place]  [address] Main: [phone]  No orders of the defined types were placed in this encounter.   Meds ordered this encounter  Medications   amLODipine  (NORVASC ) 10 MG tablet    Sig: Take 1 tablet (10 mg total) by mouth daily. Okay to start with half tablet = 5mg  daily for first 1-2  weeks, if needed okay to raise dose to 10mg .    Dispense:  90 tablet    Refill:  0    Follow up plan: Return in about 6 weeks (around 09/09/2024) for 6 week follow-up HTN, OSA.   Marsa Officer, DO [facility] 07/29/2024, 10:06 AM

## 2024-07-29 NOTE — Patient Instructions (Addendum)
 Thank you for coming to the office today.  ADD Amlodipine  10mg , start with half tab 5mg  daily, can consider half twice a day or 10 daily if needed  Goal < 150 / 90  Keep an eye on readings. If severe concerns or symptoms  --------------------  If we order the test - it would be through  [facility]  -----------  Referral to ENT for sleep evaluation IF they agree, otherwise we will find a different place.  [facility] [address] [place],  [place]  [address] Main: [phone]   Please schedule a Follow-up Appointment to: Return in about 6 weeks (around 09/09/2024) for 6 week follow-up HTN, OSA.  If you have any other questions or concerns, please feel free to call the office or send a message through MyChart. You may also schedule an earlier appointment if necessary.  Additionally, you may be receiving a survey about your experience at our office within a few days to 1 week by e-mail or mail. We value your feedback.  Marsa Officer, DO [facility], [place]

## 2024-07-29 NOTE — Telephone Encounter (Signed)
 Recommend follow up with PCP

## 2024-08-04 ENCOUNTER — Ambulatory Visit: Admitting: Family Medicine

## 2024-08-04 DIAGNOSIS — K529 Noninfective gastroenteritis and colitis, unspecified: Secondary | ICD-10-CM | POA: Diagnosis not present

## 2024-08-08 ENCOUNTER — Ambulatory Visit: Admitting: Family Medicine

## 2024-08-21 ENCOUNTER — Institutional Professional Consult (permissible substitution) (INDEPENDENT_AMBULATORY_CARE_PROVIDER_SITE_OTHER): Admitting: Otolaryngology

## 2024-08-26 ENCOUNTER — Ambulatory Visit (INDEPENDENT_AMBULATORY_CARE_PROVIDER_SITE_OTHER): Admitting: Otolaryngology

## 2024-08-26 ENCOUNTER — Encounter (INDEPENDENT_AMBULATORY_CARE_PROVIDER_SITE_OTHER): Payer: Self-pay | Admitting: Otolaryngology

## 2024-08-26 VITALS — BP 147/70 | HR 77 | Wt 161.6 lb

## 2024-08-26 DIAGNOSIS — G4733 Obstructive sleep apnea (adult) (pediatric): Secondary | ICD-10-CM | POA: Diagnosis not present

## 2024-08-26 DIAGNOSIS — Z789 Other specified health status: Secondary | ICD-10-CM

## 2024-08-26 DIAGNOSIS — Z91198 Patient's noncompliance with other medical treatment and regimen for other reason: Secondary | ICD-10-CM | POA: Diagnosis not present

## 2024-08-26 NOTE — Patient Instructions (Signed)
  It was very nice to meet you today,   Please see the following link for the [facility] program   [email or web address]  This website has information about how you can connect to other patients who have had Inspire Implant procedure

## 2024-08-26 NOTE — Progress Notes (Signed)
 BP was high initial take. Patient is having her BP managed. Her second BP take was still elevated. Her referring provider believes it could be linked to her OSA.

## 2024-08-26 NOTE — Progress Notes (Signed)
 ENT CONSULT:  Reason for Consult: OSA CPAP intolerance   HPI: Discussed the use of AI scribe software for clinical note transcription with the patient, who gave verbal consent to proceed.  History of Present Illness Amanda Wang is a 73 year old female with sleep apnea who presents for evaluation of treatment options.  She has a history of sleep apnea diagnosed approximately nine years ago. She experiences difficulty using her CPAP machine due to sleepwalking, often finding the machine across the room in the morning. As a result, she is not using the CPAP machine regularly.  She is experiencing issues with uncontrolled blood pressure and is concerned that her sleep apnea may be contributing to this problem. Despite being on multiple medications, her blood pressure remains difficult to control.  She has a history of a pulmonary embolism that occurred during surgery, for which she was on blood thinners for three months. She is not currently on any blood thinners.  No personal history of strokes or heart attacks, but her mother had a stroke.  Records Reviewed:  PCP Dr Larose referral 07/29/24 referral to ENT for OSA eval and management. Known OSA diagnosis >10 years ago [facility] on last sleep study, failed full facemask, now having worsening resistant hypertension, needs OSA addressed, not using CPAP anymore. She will need repeat sleep study and consider options including Inspire or alternative mask / device options / settings. Case discussed with Dr Mikylah Ackroyd already   Obstructive sleep apnea persistent, uncontrolled Suspected recurrence or worsening, potentially contributing to resistant hypertension. Previous CPAP use ineffective due to non-compliance, intolerance to full facemask. Discussed alternative treatments such as Inspire implant and other mask / CPAP options   - Refer to [facility]NT for sleep evaluation and potential Inspire implant discussion. -  Consider home sleep study if recommended by ENT.    Past Medical History:  Diagnosis Date   Advanced care planning/counseling discussion 09/16/2018   Anemia    Anxiety    Arthritis    Chiari malformation type I (HCC)    Depression    Family history of adverse reaction to anesthesia    daughter PONV   GERD (gastroesophageal reflux disease)    Hyperlipidemia    Hypertension    Iron  deficiency anemia    Irritable bowel syndrome with diarrhea    Osteoarthritis of left knee    PONV (postoperative nausea and vomiting)    Restless legs    Seasonal allergies    Sleep apnea    no Cpap use   Small intestinal bacterial overgrowth    Spinal stenosis    Vaccine reaction, initial encounter 03/11/2020    Past Surgical History:  Procedure Laterality Date   BRAIN SURGERY  09/2005   BREAST BIOPSY Right    neg   chiari malformation I decompression surgery  10/2005   COLONOSCOPY N/A 07/25/2024   Procedure: COLONOSCOPY;  Surgeon: Therisa Bi, MD;  Location: [facility];  Service: Endoscopy;  Laterality: N/A;   COLONOSCOPY WITH PROPOFOL  N/A 11/28/2018   Procedure: COLONOSCOPY WITH PROPOFOL ;  Surgeon: Therisa Bi, MD;  Location: [facility] ENDOSCOPY;  Service: Gastroenterology;  Laterality: N/A;   COLONOSCOPY WITH PROPOFOL  N/A 10/27/2021   Procedure: COLONOSCOPY WITH PROPOFOL ;  Surgeon: Therisa Bi, MD;  Location[facility] ENDOSCOPY;  Service: Gastroenterology;  Laterality: N/A;   ESOPHAGOGASTRODUODENOSCOPY N/A 10/27/2021   Procedure: ESOPHAGOGASTRODUODENOSCOPY (EGD);  Surgeon: Therisa Bi, MD;  Location: [facility] ENDOSCOPY;  Service: Gastroenterology;  Laterality: N/A;   HERNIA REPAIR  s/p gastric sleeve   KNEE ARTHROSCOPY WITH MEDIAL MENISECTOMY Left 01/12/2022   Procedure: KNEE ARTHROSCOPY WITH MEDIAL MENISECTOMY;  Surgeon: Marchia Drivers, MD;  Location: [facility] ORS;  Service: Orthopedics;  Laterality: Left;   LAPAROSCOPIC GASTRIC SLEEVE RESECTION     LARYNGOSCOPY N/A 1995   LASIK Bilateral     NASAL SEPTUM SURGERY     ORIF TOE FRACTURE N/A    TOTAL KNEE ARTHROPLASTY Left 06/05/2023   Procedure: TOTAL KNEE ARTHROPLASTY;  Surgeon: Marchia Drivers, MD;  Location: [facility] ORS;  Service: Orthopedics;  Laterality: Left;    Family History  Problem Relation Age of Onset   Hypertension Mother    Cancer Mother    Stroke Mother    Varicose Veins Mother    Heart disease Father    Breast cancer Sister 87   Breast cancer Paternal Aunt    Diabetes Paternal Grandmother    Cancer Paternal Grandmother    Cancer Paternal Aunt     Social History:  reports that she quit smoking about 47 years ago. Her smoking use included cigarettes. She started smoking about 48 years ago. She has a 0.5 pack-year smoking history. She has never been exposed to tobacco smoke. She has never used smokeless tobacco. She reports current alcohol  use. She reports that she does not use drugs.  Allergies:  Allergies  Allergen Reactions   Fexofenadine Anxiety    Makes her have a panic attack   Amlodipine  Swelling   Codeine  Nausea Only    Dizziness   Methylphenidate Hcl     Caused a seizure   Zoloft [Sertraline] Other (See Comments)    Sleepiness   Magnevist [Gadopentetate] Hives and Itching    IV Contrast   Sulfa Antibiotics Rash    Medications: I have reviewed the patient's current medications.  The PMH, PSH, Medications, Allergies, and SH were reviewed and updated.  ROS: Constitutional: Negative for fever, weight loss and weight gain. Cardiovascular: Negative for chest pain and dyspnea on exertion. Respiratory: Is not experiencing shortness of breath at rest. Gastrointestinal: Negative for nausea and vomiting. Neurological: Negative for headaches. Psychiatric: The patient is not nervous/anxious  Blood pressure (!) 147/70, pulse 77, weight 161 lb 9.6 oz (73.3 kg), SpO2 97%. Body mass index is 29.56 kg/m.  PHYSICAL EXAM:  Exam: General: Well-developed, well-nourished Respiratory Respiratory  effort: Equal inspiration and expiration without stridor Cardiovascular Peripheral Vascular: Warm extremities with equal color/perfusion Eyes: No nystagmus with equal extraocular motion bilaterally Neuro/Psych/Balance: Patient oriented to person, place, and time; Appropriate mood and affect; Gait is intact with no imbalance; Cranial nerves I-XII are intact Head and Face Inspection: Normocephalic and atraumatic without mass or lesion Palpation: Facial skeleton intact without bony stepoffs Salivary Glands: No mass or tenderness Facial Strength: Facial motility symmetric and full bilaterally ENT Pinna: External ear intact and fully developed External canal: Canal is patent with intact skin Tympanic Membrane: Clear and mobile External Nose: No scar or anatomic deformity Internal Nose: Septum is straight. No polyp, or purulence. Mucosal edema and erythema present.  Bilateral inferior turbinate hypertrophy.  Lips, Teeth, and gums: Mucosa and teeth intact and viable TMJ: No pain to palpation with full mobility Oral cavity/oropharynx: No erythema or exudate, no lesions present Nasopharynx: No mass or lesion with intact mucosa Hypopharynx: Intact mucosa without pooling of secretions Larynx Glottic: Full true vocal cord mobility without lesion or mass Supraglottic: Normal appearing epiglottis and AE folds Interarytenoid Space: Moderate pachydermia&edema Subglottic Space: Patent without lesion or edema Neck Neck and Trachea:  Midline trachea without mass or lesion Thyroid : No mass or nodularity Lymphatics: No lymphadenopathy  Procedure: Preoperative diagnosis: OSA CPAP intolerance   Postoperative diagnosis:   Same   Procedure: Flexible fiberoptic laryngoscopy  Surgeon: Elena Larry, MD  Anesthesia: Topical lidocaine  and Afrin Complications: None Condition is stable throughout exam  Indications and consent:  The patient presents to the clinic with above symptoms. Indirect  laryngoscopy view was incomplete. Thus it was recommended that they undergo a flexible fiberoptic laryngoscopy. All of the risks, benefits, and potential complications were reviewed with the patient preoperatively and verbal informed consent was obtained.  Procedure: The patient was seated upright in the clinic. Topical lidocaine  and Afrin were applied to the nasal cavity. After adequate anesthesia had occurred, I then proceeded to pass the flexible telescope into the nasal cavity. The nasal cavity was patent without rhinorrhea or polyp. The nasopharynx was also patent without mass or lesion. The base of tongue was visualized and was normal. There were no signs of pooling of secretions in the piriform sinuses. The true vocal folds were mobile bilaterally. There were no signs of glottic or supraglottic mucosal lesion or mass. There was moderate interarytenoid pachydermia and post cricoid edema. The telescope was then slowly withdrawn and the patient tolerated the procedure throughout.   Studies Reviewed: CT Angio of the chest 06/10/23 MPRESSION: 1. Acute pulmonary embolism in a subsegmental branch of the right upper lobe. No evidence of right heart strain. 2. Cardiomegaly with small pericardial effusion. 3. Mosaic attenuation of the lungs compatible with air trapping. 4. Small hiatal hernia.  Assessment/Plan: Encounter Diagnoses  Name Primary?   Obstructive sleep apnea Yes   Intolerance of continuous positive airway pressure (CPAP) ventilation    Assessment & Plan Obstructive sleep apnea Obstructive sleep apnea diagnosed nine years ago. Not using CPAP due to difficulty maintaining it, possibly related to sleepwalking. Potential candidate for Inspire implant pending updated sleep study results. Discussed benefits and procedure of Inspire implant. Insurance preapproval requires drug-induced sleep endoscopy. Friedman IV tongue position and small tonsils suggest she will benefit from [facility] implant.   - Order sleep study through [facility] to assess current severity. - Provide information on Inspire implant and connect with a patient who has undergone the procedure. - Schedule drug-induced sleep endoscopy if sleep study indicates eligibility for Inspire implant.      Thank you for allowing me to participate in the care of this patient. Please do not hesitate to contact me with any questions or concerns.   Elena Larry, MD Otolaryngology [facility] Phone: [phone] Fax: [phone]    08/26/2024, 10:00 AM

## 2024-09-02 DIAGNOSIS — K529 Noninfective gastroenteritis and colitis, unspecified: Secondary | ICD-10-CM | POA: Diagnosis not present

## 2024-09-03 ENCOUNTER — Ambulatory Visit

## 2024-09-08 ENCOUNTER — Ambulatory Visit (INDEPENDENT_AMBULATORY_CARE_PROVIDER_SITE_OTHER)

## 2024-09-08 VITALS — BP 146/72 | HR 89 | Ht 62.0 in | Wt 165.8 lb

## 2024-09-08 DIAGNOSIS — Z87891 Personal history of nicotine dependence: Secondary | ICD-10-CM | POA: Diagnosis not present

## 2024-09-08 DIAGNOSIS — F513 Sleepwalking [somnambulism]: Secondary | ICD-10-CM

## 2024-09-08 DIAGNOSIS — G4733 Obstructive sleep apnea (adult) (pediatric): Secondary | ICD-10-CM | POA: Diagnosis not present

## 2024-09-08 DIAGNOSIS — I1A0 Resistant hypertension: Secondary | ICD-10-CM | POA: Diagnosis not present

## 2024-09-08 DIAGNOSIS — I10 Essential (primary) hypertension: Secondary | ICD-10-CM

## 2024-09-08 NOTE — Progress Notes (Signed)
 New Patient Pulmonology Office Visit   Subjective:  Patient ID: Amanda Wang, female    DOB: [birth year]  MRN: [id number]  Referred by: Okey Burns, MD  CC:  Chief Complaint  Patient presents with   Consult    Pt states that referring provider says that her high blood pressure could be high because she isn't using CPAP. Pt states she can't use CPAP because she sleep walks.  Sleep study 9-10 years ago.    HPI Amanda Wang is a 73 y.o. female with allergic rhinitis, provoked PE and was on [facility] for 3 months, hypertension, GERD, DM2, anxiety/depression, bariatric surgery, OSA on CPAP presents to get established for OSA.    OSA history: Diagnosed 9 years ago. Was on CPAP for mild OSA.  Difficult to tolerate with underlying sleep walking. Never been able to tolerate CPAP. Has seen ENT Dr. Soldatova.  Has discussed options of inspire implant.  Difficult to treat high blood pressure suspected because of underlying OSA. Is not necessarily thrilled about inspire and want to try non invasive approaches 1st.   Symptoms: lives byself.  Has not tried anything for sleepwalking. Sleepwalking since childhood.   Mouth breather: possibly.  Preferred sleeping position: no  Sleep related Symptoms:  Snoring- y Witnessed apnea- unsure.  Gasping/choking- no morning HA/dry mouth- no/y tired on awakening, excessive daytime sleepiness- feels tired throughout the day.  Restless legs- used to be on gabapentin . Currently does not bother her.  Hypnogogic hallucination- no sleep walking- has ground floor house. Has not had risky behaviour. But currenlty does not have bed partner and does not know how often she sleep walks.  Does have nightmares. Does not bother. Last known witnessed episode in 2009.   Sleep routine:  -Bed: MN. Falls asleep immediately.  -Nocturnal awakenings: once but now waking up more since being on budesonide.  -Wake: 7 am.  -Napping:30 min every day.  Usually on TV prior  to bed at night.   Habits: -Caffeine: no but does drink sodas everyday during supper.  -Alcohol : no -Nicotine:social smoker in college.    PRIOR TESTS and IMAGING: PSG/HSAT: see above.   MRI Brain: 2024: normal.      09/08/2024    2:00 PM  Results of the Epworth flowsheet  Sitting and reading 2  Watching TV 2  Sitting, inactive in a public place (e.g. a theatre or a meeting) 1  As a passenger in a car for an hour without a break 0  Lying down to rest in the afternoon when circumstances permit 3  Sitting and talking to someone 0  Sitting quietly after a lunch without alcohol  1  In a car, while stopped for a few minutes in traffic 0  Total score 9    Allergies: Fexofenadine, Amlodipine , Codeine , Methylphenidate hcl, Zoloft [sertraline], Magnevist [gadopentetate], and Sulfa antibiotics  Current Outpatient Medications:    albuterol  (VENTOLIN  HFA) 108 (90 Base) MCG/ACT inhaler, Inhale 2 puffs into the lungs every 4 (four) hours as needed for wheezing or shortness of breath., Disp: 8 g, Rfl: 2   amLODipine  (NORVASC ) 10 MG tablet, Take 1 tablet (10 mg total) by mouth daily. Okay to start with half tablet = 5mg  daily for first 1-2 weeks, if needed okay to raise dose to 10mg ., Disp: 90 tablet, Rfl: 0   atorvastatin  (LIPITOR) 40 MG tablet, Take 1 tablet (40 mg total) by mouth daily., Disp: 90 tablet, Rfl: 3   azelastine  (ASTELIN ) 0.1 % nasal spray, Place 1 spray  into both nostrils 2 (two) times daily. (Patient taking differently: Place 1 spray into both nostrils 2 (two) times daily.), Disp: , Rfl:    budesonide (ENTOCORT EC) 3 MG 24 hr capsule, Take 9 mg by mouth daily., Disp: , Rfl:    Carboxymethylcellul-Glycerin (LUBRICATING EYE DROPS OP), Place 1 drop into both eyes daily as needed (dry eyes)., Disp: , Rfl:    carvedilol  (COREG ) 12.5 MG tablet, Take 1 tablet (12.5 mg total) by mouth 2 (two) times daily with a meal., Disp: 180 tablet, Rfl: 0   Cholecalciferol (VITAMIN D -3 PO), Take 1  tablet by mouth 1 day or 1 dose., Disp: , Rfl:    cyclobenzaprine  (FLEXERIL ) 10 MG tablet, Take 1 tablet (10 mg total) by mouth 3 (three) times daily as needed for muscle spasms. (Patient taking differently: Take 1 tablet (10 mg total) by mouth 3 (three) times daily as needed for muscle spasms.), Disp: 90 tablet, Rfl: 3   levETIRAcetam  (KEPPRA ) 500 MG tablet, Take 1 tablet (500 mg total) by mouth 2 (two) times daily., Disp: 180 tablet, Rfl: 3   LORazepam  (ATIVAN ) 1 MG tablet, Take 0.5-1 tablets (0.5-1 mg total) by mouth daily as needed for anxiety., Disp: 30 tablet, Rfl: 2   omeprazole  (PRILOSEC) 40 MG capsule, Take 1 capsule (40 mg total) by mouth daily., Disp: 90 capsule, Rfl: 3   Spacer/Aero-Holding Chambers (AEROCHAMBER PLUS WITH MASK) inhaler, Use with inhaler as needed., Disp: 1 each, Rfl: 0   valsartan  (DIOVAN ) 320 MG tablet, Take 1 tablet (320 mg total) by mouth daily., Disp: 90 tablet, Rfl: 3   Vilazodone  HCl (VIIBRYD ) 40 MG TABS, Take 1 tablet (40 mg total) by mouth daily., Disp: 90 tablet, Rfl: 1 Past Medical History:  Diagnosis Date   Advanced care planning/counseling discussion 09/16/2018   Anemia    Anxiety    Arthritis    Chiari malformation type I (HCC)    Depression    Family history of adverse reaction to anesthesia    daughter PONV   GERD (gastroesophageal reflux disease)    Hyperlipidemia    Hypertension    Iron  deficiency anemia    Irritable bowel syndrome with diarrhea    Osteoarthritis of left knee    PONV (postoperative nausea and vomiting)    Restless legs    Seasonal allergies    Sleep apnea    no Cpap use   Small intestinal bacterial overgrowth    Spinal stenosis    Vaccine reaction, initial encounter 03/11/2020   Past Surgical History:  Procedure Laterality Date   BRAIN SURGERY  09/2005   BREAST BIOPSY Right    neg   chiari malformation I decompression surgery  10/2005   COLONOSCOPY N/A 07/25/2024   Procedure: COLONOSCOPY;  Surgeon: Therisa Bi, MD;   Location: [facility];  Service: Endoscopy;  Laterality: N/A;   COLONOSCOPY WITH PROPOFOL  N/A 11/28/2018   Procedure: COLONOSCOPY WITH PROPOFOL ;  Surgeon: Therisa Bi, MD;  Location: [facility] END[facility]COPY;  Service: Gastroenterology;  Laterality: N/A;   COLONOSCOPY WITH PROPOFOL  N/A 10/27/2021   Procedure: COLONOSCOPY WITH PROPOFOL ;  Surgeon: Therisa Bi, MD;  Location: [facility] END[facility]COPY;  Service: Gastroenterology;  Laterality: N/A;   ESOPHAGOGASTRODUODENOSCOPY N/A 10/27/2021   Procedure: ESOPHAGOGASTRODUODENOSCOPY (EGD);  Surgeon: Therisa Bi, MD;  Location: [facility] ENDOSCOPY;  Service: Gastroenterology;  Laterality: N/A;   HERNIA REPAIR     s/p gastric sleeve   KNEE ARTHROSCOPY WITH MEDIAL MENISECTOMY Left 01/12/2022   Procedure: KNEE ARTHROSCOPY WITH MEDIAL MENISECTOMY;  Surgeon: Marchia,  Franky, MD;  Location: [facility] ORS;  Service: Orthopedics;  Laterality: Left;   LAPAROSCOPIC GASTRIC SLEEVE RESECTION     LARYNGOSCOPY N/A 1995   LASIK Bilateral    NASAL SEPTUM SURGERY     ORIF TOE FRACTURE N/A    TOTAL KNEE ARTHROPLASTY Left 06/05/2023   Procedure: TOTAL KNEE ARTHROPLASTY;  Surgeon: Marchia Franky, MD;  Location: [facility] ORS;  Service: Orthopedics;  Laterality: Left;   Family History  Problem Relation Age of Onset   Hypertension Mother    Cancer Mother    Stroke Mother    Varicose Veins Mother    Heart disease Father    Breast cancer Sister 61   Breast cancer Paternal Aunt    Diabetes Paternal Grandmother    Cancer Paternal Grandmother    Cancer Paternal Aunt    Social History   Socioeconomic History   Marital status: Divorced    Spouse name: Not on file   Number of children: 2   Years of education: Not on file   Highest education level: Bachelor's degree (e.g., BA, AB, BS)  Occupational History   Occupation: part time   Tobacco Use   Smoking status: Former    Current packs/day: 0.00    Average packs/day: 0.5 packs/day for 1 year (0.5 ttl pk-yrs)    Types: Cigarettes     Start date: 07/26/1976    Quit date: 07/26/1977    Years since quitting: 47.1    Passive exposure: Never   Smokeless tobacco: Never   Tobacco comments:    Smoked for one year, only smoked 1-2 cigarettes when she went out  Vaping Use   Vaping status: Never Used  Substance and Sexual Activity   Alcohol  use: Yes    Alcohol /week: 0.0 standard drinks of alcohol     Comment: on occasion 1-2 x month   Drug use: No   Sexual activity: Yes  Other Topics Concern   Not on file  Social History Narrative   Lives alone.   Social Drivers of Corporate investment banker Strain: Low Risk  (06/09/2024)   Received from [facility]   Overall Financial Resource Strain (CARDIA)    Difficulty of Paying Living Expenses: Not hard at all  Food Insecurity: No Food Insecurity (06/09/2024)   Received from [facility]   Hunger Vital Sign    Within the past 12 months, you worried that your food would run out before you got the money to buy more.: Never true    Within the past 12 months, the food you bought just didn't last and you didn't have money to get more.: Never true  Transportation Needs: No Transportation Needs (06/09/2024)   Received from [facility] - Transportation    In the past 12 months, has lack of transportation kept you from medical appointments or from getting medications?: No    Lack of Transportation (Non-Medical): No  Physical Activity: Inactive (10/17/2021)   Exercise Vital Sign    Days of Exercise per Week: 0 days    Minutes of Exercise per Session: 0 min  Stress: Stress Concern Present (10/17/2021)   [employer] of Occupational Health - Occupational Stress Questionnaire    Feeling of Stress : To some extent  Social Connections: Moderately Integrated (09/22/2019)   Social Connection and Isolation Panel    Frequency of Communication with Friends and Family: More than three times a week    Frequency of Social Gatherings  with Friends  and Family: More than three times a week    Attends Religious Services: More than 4 times per year    Active Member of Clubs or Organizations: Yes    Attends Banker Meetings: More than 4 times per year    Marital Status: Divorced  Intimate Partner Violence: Not At Risk (06/10/2023)   Humiliation, Afraid, Rape, and Kick questionnaire    Fear of Current or Ex-Partner: No    Emotionally Abused: No    Physically Abused: No    Sexually Abused: No       Objective:  BP (!) 146/72   Pulse 89   Ht 5' 2 (1.575 m)   Wt 165 lb 12.8 oz (75.2 kg)   SpO2 98% Comment: RA  BMI 30.33 kg/m  BMI Readings from Last 3 Encounters:  09/08/24 30.33 kg/m  08/26/24 29.56 kg/m  07/29/24 29.54 kg/m    Physical Exam: CONSTITUTIONAL: NAD, well-appearing NASAL/OROPHARYNX:  Normal mucosa. No septal deviation. No hypertrophy of inferior turbinates. Modified Mallampati score 3.  CV: RRR s1s2 nl, no murmurs  RESP: Clear to auscultation, normal respiratory effort   NEURO: CN II/XII grossly intact PSYCH: Alert & oriented x 3, Euthymic, appropriate affect  Diagnostic Review:  Last metabolic panel Lab Results  Component Value Date   GLUCOSE 139 (H) 03/14/2024   NA 143 03/14/2024   K 3.7 03/14/2024   CL 103 03/14/2024   CO2 31 03/14/2024   BUN 15 03/14/2024   CREATININE 0.65 03/14/2024   EGFR 94 06/26/2023   CALCIUM  8.7 03/14/2024   PHOS 3.2 07/24/2012   PROT 6.5 03/14/2024   ALBUMIN 3.1 (L) 06/10/2023   LABGLOB 3.0 10/06/2021   AGRATIO 1.6 10/06/2021   BILITOT 0.5 03/14/2024   ALKPHOS 63 06/10/2023   AST 12 03/14/2024   ALT 7 03/14/2024   ANIONGAP 10 06/11/2023         Assessment & Plan:   Assessment & Plan OSA (obstructive sleep apnea) I discussed with the patient the pathophysiology of obstructive sleep apnea, its association with weight, and its negative effects on hypertension, diabetes, mental health, A-fib, stroke if left untreated.  I briefly discussed  the treatment options for obstructive sleep apnea  Mild OSA in the past.  Difficulty tolerate CPAP because of sleepwalking. Repeat HST to evaluate for severity of OSA. Treatment options of CPAP versus MAD discussed in addition to inspire.  Resistant hypertension Continue with 3 blood pressure medications.  If patient does have significant OSA this might be contributing to her blood pressure resistance.  Entocort is probably adding to the difficulty in treating blood pressure. Sleep walking Last known witnessed episode in 2009.  Denies bothering her.  Environmental safety discussed. Briefly discussed about use of benzos for sleepwalking.  Will consider it in future if needed.  Orders Placed This Encounter  Procedures   Home sleep test    She was counselled about not driving while drowsy which is common side effect of sleep related disorders.   Return for 1 month After Sleep study.   Latriece Anstine, MD

## 2024-09-08 NOTE — Patient Instructions (Addendum)
 Notification of test results are managed in the following manner: If there are any recommendations or changes to the plan of care discussed in office today, we will contact you and let you know what they are. If you do not hear from us , then your results are normal/expected and you can view them through your MyChart account, or a letter will be sent to you. Thank you again for trusting us  with your care [facility].  Follow up 1 month after sleep study.

## 2024-09-10 ENCOUNTER — Ambulatory Visit: Admitting: Family Medicine

## 2024-09-16 ENCOUNTER — Encounter

## 2024-09-16 DIAGNOSIS — G4730 Sleep apnea, unspecified: Secondary | ICD-10-CM | POA: Diagnosis not present

## 2024-09-16 DIAGNOSIS — G4733 Obstructive sleep apnea (adult) (pediatric): Secondary | ICD-10-CM

## 2024-09-22 NOTE — Progress Notes (Signed)
 Amanda Wang                                          MRN: [id number]   09/22/2024   The [facility] Quality Team Specialist reviewed this patient medical record for the purposes of chart review for care gap closure. The following were reviewed: chart review for care gap closure-controlling blood pressure and diabetic eye exam.    [facility] Quality Team

## 2024-09-24 ENCOUNTER — Ambulatory Visit: Admitting: Family Medicine

## 2024-09-24 ENCOUNTER — Institutional Professional Consult (permissible substitution) (INDEPENDENT_AMBULATORY_CARE_PROVIDER_SITE_OTHER): Admitting: Otolaryngology

## 2024-09-30 ENCOUNTER — Other Ambulatory Visit: Payer: Self-pay | Admitting: Family Medicine

## 2024-09-30 ENCOUNTER — Ambulatory Visit: Admitting: Family Medicine

## 2024-09-30 ENCOUNTER — Encounter: Payer: Self-pay | Admitting: Family Medicine

## 2024-09-30 VITALS — BP 120/70 | HR 64 | Ht 62.0 in | Wt 166.1 lb

## 2024-09-30 DIAGNOSIS — E1169 Type 2 diabetes mellitus with other specified complication: Secondary | ICD-10-CM

## 2024-09-30 DIAGNOSIS — Z0000 Encounter for general adult medical examination without abnormal findings: Secondary | ICD-10-CM

## 2024-09-30 DIAGNOSIS — G4733 Obstructive sleep apnea (adult) (pediatric): Secondary | ICD-10-CM

## 2024-09-30 DIAGNOSIS — I10 Essential (primary) hypertension: Secondary | ICD-10-CM | POA: Diagnosis not present

## 2024-09-30 DIAGNOSIS — E559 Vitamin D deficiency, unspecified: Secondary | ICD-10-CM

## 2024-09-30 DIAGNOSIS — D508 Other iron deficiency anemias: Secondary | ICD-10-CM

## 2024-09-30 DIAGNOSIS — E782 Mixed hyperlipidemia: Secondary | ICD-10-CM

## 2024-09-30 DIAGNOSIS — E785 Hyperlipidemia, unspecified: Secondary | ICD-10-CM | POA: Diagnosis not present

## 2024-09-30 LAB — POCT GLYCOSYLATED HEMOGLOBIN (HGB A1C): Hemoglobin A1C: 6.3 % — AB (ref 4.0–5.6)

## 2024-09-30 NOTE — Patient Instructions (Addendum)
 Thank you for coming to the office today.  Recent Labs    03/14/24 0907 09/30/24 0930  HGBA1C 7.3* 6.3*   Diabetes is well controlled, we do not need medications at this time.  It seems like your BP is improved overall.  Keep waiting for sleep apnea treatment to solve the sleep apnea. It can improve your BP if this is treated.  If BP elevated again, please let me know we can consider resuming Amlodipine  if we need to.   Please schedule a Follow-up Appointment to: Return for 6 month fasting lab > 1 week later Annual Physical.  If you have any other questions or concerns, please feel free to call the office or send a message through MyChart. You may also schedule an earlier appointment if necessary.  Additionally, you may be receiving a survey about your experience at our office within a few days to 1 week by e-mail or mail. We value your feedback.  Marsa Officer, DO [facility], [place]

## 2024-09-30 NOTE — Progress Notes (Signed)
 Subjective:    Patient ID: Amanda Wang, female    DOB: 1951-11-10, 73 y.o.   MRN: [id number]  Amanda Wang is a 73 y.o. female presenting on 09/30/2024 for Medical Management of Chronic Issues   HPI  Discussed the use of AI scribe software for clinical note transcription with the patient, who gave verbal consent to proceed.  History of Present Illness   Amanda Wang is a 73 year old female with diabetes and hypertension who presents for a follow-up visit.   Following Dr Therisa GI, Sleep Apnea doctor in [place]  Type 2 Diabetes A1c down to 6.3 (previously 7.3) Improved with diet lifestyle Not on medication  Hypertension Elevated blood pressure - Blood pressure appears to be stress-related, previously normalized during unemployment in her fifties and increased upon return to work - She has history with OSA see below, not treated due to CPAP intolerance   Last visit 07/2024 She was given Amlodipine  10mg , but had leg swelling her BP improved, she discontinued it 4-5 days ago and BP remains normal and swelling went back down. Tried ice elevation magnesium cream Now pursuing OSA management, went to [facility]   OSA Awaiting further management for OSA upcoming oral appliance  Obstructive sleep apnea symptoms - History of obstructive sleep apnea, previously tested and treated with CPAP - Frequently removes CPAP mask at night in past when used - No sleep study in the past 8-9 years - Daily fatigue attributed to sleep apnea - Suspects worsening sleep apnea contributing to uncontrolled hypertension        07/29/2024    9:45 AM 07/18/2024   10:04 AM 03/24/2024    1:58 PM  Depression screen PHQ 2/9  Decreased Interest 1 1 1   Down, Depressed, Hopeless 0 1 1  PHQ - 2 Score 1 2 2   Altered sleeping 1 1 2   Tired, decreased energy 1 1 2   Change in appetite 0 1 2  Feeling bad or failure about yourself  0 0 1  Trouble concentrating 0 0 0  Moving slowly or  fidgety/restless 0 0 0  Suicidal thoughts 0 0 0  PHQ-9 Score 3 5 9   Difficult doing work/chores Somewhat difficult Somewhat difficult Somewhat difficult       07/29/2024    9:45 AM 07/18/2024   10:08 AM 03/24/2024    1:58 PM 12/13/2023   11:04 AM  GAD 7 : Generalized Anxiety Score  Nervous, Anxious, on Edge 1 0 0 1  Control/stop worrying 0 1 0 1  Worry too much - different things 0 0 1 1  Trouble relaxing 0 0 0 0  Restless 0 0 0 0  Easily annoyed or irritable 1 1 1  0  Afraid - awful might happen 0 0 0 0  Total GAD 7 Score 2 2 2 3   Anxiety Difficulty Not difficult at all Not difficult at all Not difficult at all     Social History   Tobacco Use   Smoking status: Former    Current packs/day: 0.00    Average packs/day: 0.5 packs/day for 1 year (0.5 ttl pk-yrs)    Types: Cigarettes    Start date: 07/26/1976    Quit date: 07/26/1977    Years since quitting: 47.2    Passive exposure: Never   Smokeless tobacco: Never   Tobacco comments:    Smoked for one year, only smoked 1-2 cigarettes when she went out  Vaping Use   Vaping status: Never Used  Substance Use Topics   Alcohol  use: Yes    Alcohol /week: 0.0 standard drinks of alcohol     Comment: on occasion 1-2 x month   Drug use: No    Review of Systems Per HPI unless specifically indicated above     Objective:    BP 120/70 (BP Location: Right Arm, Patient Position: Sitting, Cuff Size: Normal)   Pulse 64   Ht 5' 2 (1.575 m)   Wt 166 lb 2 oz (75.4 kg)   SpO2 95%   BMI 30.38 kg/m   Wt Readings from Last 3 Encounters:  09/30/24 166 lb 2 oz (75.4 kg)  09/08/24 165 lb 12.8 oz (75.2 kg)  08/26/24 161 lb 9.6 oz (73.3 kg)    Physical Exam Vitals and nursing note reviewed.  Constitutional:      General: She is not in acute distress.    Appearance: She is well-developed. She is not diaphoretic.     Comments: Well-appearing, comfortable, cooperative  HENT:     Head: Normocephalic and atraumatic.  Eyes:     General:         Right eye: No discharge.        Left eye: No discharge.     Conjunctiva/sclera: Conjunctivae normal.  Neck:     Thyroid : No thyromegaly.  Cardiovascular:     Rate and Rhythm: Normal rate and regular rhythm.     Heart sounds: Normal heart sounds. No murmur heard. Pulmonary:     Effort: Pulmonary effort is normal. No respiratory distress.     Breath sounds: Normal breath sounds. No wheezing or rales.  Musculoskeletal:        General: Normal range of motion.     Cervical back: Normal range of motion and neck supple.  Lymphadenopathy:     Cervical: No cervical adenopathy.  Skin:    General: Skin is warm and dry.     Findings: No erythema or rash.  Neurological:     Mental Status: She is alert and oriented to person, place, and time.  Psychiatric:        Behavior: Behavior normal.     Comments: Well groomed, good eye contact, normal speech and thoughts     Results for orders placed or performed in visit on 09/30/24  POCT HgB A1C   Collection Time: 09/30/24  9:30 AM  Result Value Ref Range   Hemoglobin A1C 6.3 (A) 4.0 - 5.6 %   HbA1c POC (<> result, manual entry)     HbA1c, POC (prediabetic range)     HbA1c, POC (controlled diabetic range)        Assessment & Plan:   Problem List Items Addressed This Visit     Essential hypertension   Obstructive sleep apnea   Type 2 diabetes mellitus with hyperlipidemia (HCC) - Primary   Relevant Orders   POCT HgB A1C (Completed)     Essential hypertension Blood pressure improved from 180/90 mmHg to 120/80 mmHg. Amlodipine  discontinued due to ankle swelling. Blood pressure stable on valsartan  and carvedilol . Stress and untreated sleep apnea may affect control. - Continue valsartan  and carvedilol . - Avoid amlodipine  unless significant blood pressure increase. - Reassess after sleep apnea treatment.  Suspected obstructive sleep apnea Sleep study conducted; follow-up with specialist scheduled. Untreated sleep apnea may affect blood  pressure. - Follow up with sleep specialist in early November. - Consider night guard or CPAP based on recommendations.  Type 2 diabetes mellitus without complications A1c decreased from 7.3% to 6.3% with lifestyle  modifications, no medication needed. - Continue lifestyle modifications. - Reassess A1c in six months.        Orders Placed This Encounter  Procedures   POCT HgB A1C    No orders of the defined types were placed in this encounter.   Follow up plan: Return for 6 month fasting lab > 1 week later Annual Physical.  Future labs ordered for 03/18/25   Marsa Officer, DO [facility] 09/30/2024, 9:41 AM

## 2024-10-02 DIAGNOSIS — G4733 Obstructive sleep apnea (adult) (pediatric): Secondary | ICD-10-CM | POA: Diagnosis not present

## 2024-10-03 ENCOUNTER — Telehealth: Payer: Self-pay

## 2024-10-03 DIAGNOSIS — G4733 Obstructive sleep apnea (adult) (pediatric): Secondary | ICD-10-CM

## 2024-10-03 NOTE — Telephone Encounter (Signed)
 ATC X1. LMTCB

## 2024-10-03 NOTE — Telephone Encounter (Signed)
 Sleep study read. Patient has mild OSA but significant desaturation non congruent to OSA severity. In addition there was periodicity seen on the raw data. Will get in lab CPAP titration.

## 2024-10-06 NOTE — Telephone Encounter (Signed)
 Spoke with the pt and notified of results per Dr. Pawar. She verbalized understanding. She has already been scheduled for CPAP titration. Nothing further needed at this time.

## 2024-10-07 ENCOUNTER — Encounter: Payer: Self-pay | Admitting: *Deleted

## 2024-10-07 ENCOUNTER — Other Ambulatory Visit: Payer: Self-pay | Admitting: *Deleted

## 2024-10-07 DIAGNOSIS — D509 Iron deficiency anemia, unspecified: Secondary | ICD-10-CM

## 2024-10-08 ENCOUNTER — Inpatient Hospital Stay: Attending: Oncology

## 2024-10-08 ENCOUNTER — Inpatient Hospital Stay

## 2024-10-08 ENCOUNTER — Ambulatory Visit: Payer: Self-pay | Admitting: Oncology

## 2024-10-08 ENCOUNTER — Encounter: Payer: Self-pay | Admitting: Family Medicine

## 2024-10-08 ENCOUNTER — Inpatient Hospital Stay: Admitting: Oncology

## 2024-10-08 ENCOUNTER — Encounter: Payer: Self-pay | Admitting: Oncology

## 2024-10-08 VITALS — BP 166/79 | HR 66 | Temp 98.0°F | Resp 18 | Wt 169.8 lb

## 2024-10-08 DIAGNOSIS — I10 Essential (primary) hypertension: Secondary | ICD-10-CM

## 2024-10-08 DIAGNOSIS — Z87891 Personal history of nicotine dependence: Secondary | ICD-10-CM | POA: Insufficient documentation

## 2024-10-08 DIAGNOSIS — K529 Noninfective gastroenteritis and colitis, unspecified: Secondary | ICD-10-CM | POA: Diagnosis not present

## 2024-10-08 DIAGNOSIS — E538 Deficiency of other specified B group vitamins: Secondary | ICD-10-CM | POA: Diagnosis not present

## 2024-10-08 DIAGNOSIS — Z9884 Bariatric surgery status: Secondary | ICD-10-CM | POA: Diagnosis not present

## 2024-10-08 DIAGNOSIS — D509 Iron deficiency anemia, unspecified: Secondary | ICD-10-CM

## 2024-10-08 DIAGNOSIS — Z803 Family history of malignant neoplasm of breast: Secondary | ICD-10-CM | POA: Insufficient documentation

## 2024-10-08 LAB — CBC WITH DIFFERENTIAL/PLATELET
Abs Immature Granulocytes: 0.08 K/uL — ABNORMAL HIGH (ref 0.00–0.07)
Basophils Absolute: 0 K/uL (ref 0.0–0.1)
Basophils Relative: 0 %
Eosinophils Absolute: 0 K/uL (ref 0.0–0.5)
Eosinophils Relative: 0 %
HCT: 34.3 % — ABNORMAL LOW (ref 36.0–46.0)
Hemoglobin: 11.4 g/dL — ABNORMAL LOW (ref 12.0–15.0)
Immature Granulocytes: 1 %
Lymphocytes Relative: 10 %
Lymphs Abs: 1.1 K/uL (ref 0.7–4.0)
MCH: 29.4 pg (ref 26.0–34.0)
MCHC: 33.2 g/dL (ref 30.0–36.0)
MCV: 88.4 fL (ref 80.0–100.0)
Monocytes Absolute: 0.6 K/uL (ref 0.1–1.0)
Monocytes Relative: 5 %
Neutro Abs: 9.7 K/uL — ABNORMAL HIGH (ref 1.7–7.7)
Neutrophils Relative %: 84 %
Platelets: 284 K/uL (ref 150–400)
RBC: 3.88 MIL/uL (ref 3.87–5.11)
RDW: 15.1 % (ref 11.5–15.5)
WBC: 11.5 K/uL — ABNORMAL HIGH (ref 4.0–10.5)
nRBC: 0 % (ref 0.0–0.2)

## 2024-10-08 LAB — FERRITIN: Ferritin: 77 ng/mL (ref 11–307)

## 2024-10-08 LAB — IRON AND TIBC
Iron: 39 ug/dL (ref 28–170)
Saturation Ratios: 15 % (ref 10.4–31.8)
TIBC: 260 ug/dL (ref 250–450)
UIBC: 221 ug/dL

## 2024-10-08 LAB — VITAMIN B12: Vitamin B-12: 261 pg/mL (ref 180–914)

## 2024-10-08 NOTE — Assessment & Plan Note (Signed)
 Monitor B12, folate periodically.

## 2024-10-08 NOTE — Assessment & Plan Note (Signed)
 Recommend B12 supplementation,

## 2024-10-08 NOTE — Assessment & Plan Note (Signed)
 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  Lab Results  Component Value Date   HGB 11.4 (L) 10/08/2024   TIBC 260 10/08/2024   IRONPCTSAT 15 10/08/2024   FERRITIN 77 10/08/2024    Hemoglobin has improved and iron  panel is stable.  Hold of Venofer 

## 2024-10-08 NOTE — Progress Notes (Signed)
 Hematology/Oncology Progress note [phone] [phone]     ID: Amanda Wang OB: 02/29/1951  MR#: [id number]  CSN#:[id number]  Patient Care Team: Edman Marsa JINNY, DO as PCP - General (Family Medicine) Jinny Carmine, MD as Consulting Physician (Gastroenterology) Babara Call, MD as Consulting Physician (Oncology)  Chief complaints:  Iron  deficiency anemia.   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  Lab Results  Component Value Date   HGB 11.4 (L) 10/08/2024   TIBC 260 10/08/2024   IRONPCTSAT 15 10/08/2024   FERRITIN 77 10/08/2024    Hemoglobin has improved and iron  panel is stable.  Hold of Venofer   Low serum vitamin B12 Recommend B12 supplementation,  History of bariatric surgery Monitor B12, folate periodically.   Orders Placed This Encounter  Procedures   CBC with Differential (Cancer Center Only)    Standing Status:   Future    Expected Date:   04/08/2025    Expiration Date:   07/07/2025   Iron  and TIBC    Standing Status:   Future    Expected Date:   04/08/2025    Expiration Date:   07/07/2025   Ferritin    Standing Status:   Future    Expected Date:   04/08/2025    Expiration Date:   07/07/2025   Retic Panel    Standing Status:   Future    Expected Date:   04/08/2025    Expiration Date:   07/07/2025   Follow up in 6 months All questions were answered. The patient knows to call the clinic with any problems, questions or concerns.  Call Babara, MD, PhD [facility] Hematology Oncology 10/08/2024    HPI: Amanda Wang is a 73 y.o. female with past medical history of hypertension, hyperlipidemia, GERD, chiari malformation type I, arthritis, sleep apnea was referred to hematology for management of iron  deficiency anemia  Patient was seen by primary on February 23, 2023 for symptomatic anemia. CBC showed hemoglobin 8.5, MCV 73.5.  Iron  panel showed saturation  9%.  Ferritin 6.  B12 level 561.  Patient has history of gastric bypass surgery.  She has chronic history of iron  deficiency anemia.  Colonoscopy performed by Dr. Therisa in November 2022 was normal.  Endoscopy showed multiple 5 to 10 mm sessile polyp without bleeding in the stomach.  Biopsy showed hyperplastic polyps.   Interval history- Patient previously followed up with Dr.Agrawal. Patient switched care to me on 10/08/24 Extensive medical record review was performed by me  \ Patient seen today as follow-up for iron  deficiency anemia Discussed the use of AI scribe software for clinical note transcription with the patient, who gave verbal consent to proceed.  She has nonspecific moderate chronic colitis, with significant symptoms including bowel incontinence.  She has been on slow tapering Buspirone  for two months, which she finds helpful.     PAST MEDICAL HISTORY: Past Medical History:  Diagnosis Date   Advanced care planning/counseling discussion 09/16/2018   Anemia    Anxiety    Arthritis    Chiari malformation type I (HCC)    Depression    Family history of adverse reaction to anesthesia    daughter PONV   GERD (gastroesophageal reflux disease)    Hyperlipidemia    Hypertension    Iron  deficiency anemia    Irritable bowel syndrome with diarrhea    Osteoarthritis of left knee    PONV (postoperative nausea and vomiting)  Restless legs    Seasonal allergies    Sleep apnea    no Cpap use   Small intestinal bacterial overgrowth    Spinal stenosis    Vaccine reaction, initial encounter 03/11/2020    PAST SURGICAL HISTORY: Past Surgical History:  Procedure Laterality Date   BRAIN SURGERY  09/2005   BREAST BIOPSY Right    neg   chiari malformation I decompression surgery  10/2005   COLONOSCOPY N/A 07/25/2024   Procedure: COLONOSCOPY;  Surgeon: Therisa Bi, MD;  [facility]: [facility];  Service: Endoscopy;  Laterality: N/A;   COLONOSCOPY WITH PROPOFOL  N/A  11/28/2018   Procedure: COLONOSCOPY WITH PROPOFOL ;  Surgeon: Therisa Bi, MD;  [facility] END[facility];  Service: Gastroenterology;  Laterality: N/A;   COLONOSCOPY WITH PROPOFOL  N/A 10/27/2021   Procedure: COLONOSCOPY WITH PROPOFOL ;  Surgeon: Therisa Bi, MD;  Location: [facility] ENDOSCOPY;  Service: Gastroenterology;  Laterality: N/A;   ESOPHAGOGASTRODUODENOSCOPY N/A 10/27/2021   Procedure: ESOPHAGOGASTRODUODENOSCOPY (EGD);  Surgeon: Therisa Bi, MD;  Location: [facility] ENDOSCOPY;  Service: Gastroenterology;  Laterality: N/A;   HERNIA REPAIR     s/p gastric sleeve   KNEE ARTHROSCOPY WITH MEDIAL MENISECTOMY Left 01/12/2022   Procedure: KNEE ARTHROSCOPY WITH MEDIAL MENISECTOMY;  Surgeon: Marchia Drivers, MD;  Location: [facility] ORS;  Service: Orthopedics;  Laterality: Left;   LAPAROSCOPIC GASTRIC SLEEVE RESECTION     LARYNGOSCOPY N/A 1995   LASIK Bilateral    NASAL SEPTUM SURGERY     ORIF TOE FRACTURE N/A    TOTAL KNEE ARTHROPLASTY Left 06/05/2023   Procedure: TOTAL KNEE ARTHROPLASTY;  Surgeon: Marchia Drivers, MD;  Location: [facility] ORS;  Service: Orthopedics;  Laterality: Left;    FAMILY HISTORY: Family History  Problem Relation Age of Onset   Hypertension Mother    Cancer Mother    Stroke Mother    Varicose Veins Mother    Heart disease Father    Breast cancer Sister 9   Breast cancer Paternal Aunt    Diabetes Paternal Grandmother    Cancer Paternal Grandmother    Cancer Paternal Aunt     HEALTH MAINTENANCE: Social History   Tobacco Use   Smoking status: Former    Current packs/day: 0.00    Average packs/day: 0.5 packs/day for 1 year (0.5 ttl pk-yrs)    Types: Cigarettes    Start date: 07/26/1976    Quit date: 07/26/1977    Years since quitting: 47.2    Passive exposure: Never   Smokeless tobacco: Never   Tobacco comments:    Smoked for one year, only smoked 1-2 cigarettes when she went out  Vaping Use   Vaping status: Never Used  Substance Use Topics   Alcohol  use: Yes     Alcohol /week: 0.0 standard drinks of alcohol     Comment: on occasion 1-2 x month   Drug use: No     Allergies  Allergen Reactions   Fexofenadine Anxiety    Makes her have a panic attack   Amlodipine  Swelling   Codeine  Nausea Only    Dizziness   Methylphenidate Hcl     Caused a seizure   Zoloft [Sertraline] Other (See Comments)    Sleepiness   Magnevist [Gadopentetate] Hives and Itching    IV Contrast   Sulfa Antibiotics Rash    Current Outpatient Medications  Medication Sig Dispense Refill   albuterol  (VENTOLIN  HFA) 108 (90 Base) MCG/ACT inhaler Inhale 2 puffs into the lungs every 4 (four) hours as needed for wheezing or shortness of breath. 8 g  2   amLODipine  (NORVASC ) 10 MG tablet Take 1 tablet (10 mg total) by mouth daily. Okay to start with half tablet = 5mg  daily for first 1-2 weeks, if needed okay to raise dose to 10mg . 90 tablet 0   atorvastatin  (LIPITOR) 40 MG tablet Take 1 tablet (40 mg total) by mouth daily. 90 tablet 3   azelastine  (ASTELIN ) 0.1 % nasal spray Place 1 spray into both nostrils 2 (two) times daily. (Patient taking differently: Place 1 spray into both nostrils 2 (two) times daily.)     budesonide (ENTOCORT EC) 3 MG 24 hr capsule Take 9 mg by mouth daily.     Carboxymethylcellul-Glycerin (LUBRICATING EYE DROPS OP) Place 1 drop into both eyes daily as needed (dry eyes).     carvedilol  (COREG ) 12.5 MG tablet Take 1 tablet (12.5 mg total) by mouth 2 (two) times daily with a meal. 180 tablet 0   Cholecalciferol (VITAMIN D -3 PO) Take 1 tablet by mouth 1 day or 1 dose.     cyclobenzaprine  (FLEXERIL ) 10 MG tablet Take 1 tablet (10 mg total) by mouth 3 (three) times daily as needed for muscle spasms. (Patient taking differently: Take 1 tablet (10 mg total) by mouth 3 (three) times daily as needed for muscle spasms.) 90 tablet 3   levETIRAcetam  (KEPPRA ) 500 MG tablet Take 1 tablet (500 mg total) by mouth 2 (two) times daily. 180 tablet 3   LORazepam  (ATIVAN ) 1 MG  tablet Take 0.5-1 tablets (0.5-1 mg total) by mouth daily as needed for anxiety. 30 tablet 2   omeprazole  (PRILOSEC) 40 MG capsule Take 1 capsule (40 mg total) by mouth daily. 90 capsule 3   Spacer/Aero-Holding Chambers (AEROCHAMBER PLUS WITH MASK) inhaler Use with inhaler as needed. 1 each 0   valsartan  (DIOVAN ) 320 MG tablet Take 1 tablet (320 mg total) by mouth daily. 90 tablet 3   Vilazodone  HCl (VIIBRYD ) 40 MG TABS Take 1 tablet (40 mg total) by mouth daily. 90 tablet 1   No current facility-administered medications for this visit.    Review of Systems  Constitutional:  Negative for appetite change, chills, fatigue and fever.  HENT:   Negative for hearing loss and voice change.   Eyes:  Negative for eye problems.  Respiratory:  Negative for chest tightness and cough.   Cardiovascular:  Negative for chest pain.  Gastrointestinal:  Positive for diarrhea. Negative for abdominal distention, abdominal pain, blood in stool, nausea and vomiting.  Endocrine: Negative for hot flashes.  Genitourinary:  Negative for difficulty urinating and frequency.   Musculoskeletal:  Negative for arthralgias.  Skin:  Negative for itching and rash.  Neurological:  Negative for extremity weakness.  Hematological:  Negative for adenopathy.  Psychiatric/Behavioral:  Negative for confusion.     OBJECTIVE: Vitals:   10/08/24 1438 10/08/24 1447  BP: (!) 187/73 (!) 166/79  Pulse: 66   Resp: 18   Temp: 98 F (36.7 C)   SpO2: 100%       Body mass index is 31.06 kg/m.      Physical Exam Constitutional:      General: She is not in acute distress.    Appearance: She is not diaphoretic.  HENT:     Head: Normocephalic and atraumatic.  Eyes:     General: No scleral icterus. Cardiovascular:     Rate and Rhythm: Normal rate and regular rhythm.     Heart sounds: No murmur heard. Pulmonary:     Effort: Pulmonary effort is normal. No  respiratory distress.     Breath sounds: Normal breath sounds.   Abdominal:     General: There is no distension.  Musculoskeletal:        General: Normal range of motion.     Cervical back: Normal range of motion and neck supple.  Skin:    General: Skin is warm and dry.     Findings: No erythema.  Neurological:     Mental Status: She is alert and oriented to person, place, and time. Mental status is at baseline.     Motor: No abnormal muscle tone.  Psychiatric:        Mood and Affect: Mood and affect normal.       LAB RESULTS:  Lab Results  Component Value Date   NA 143 03/14/2024   K 3.7 03/14/2024   CL 103 03/14/2024   CO2 31 03/14/2024   GLUCOSE 139 (H) 03/14/2024   BUN 15 03/14/2024   CREATININE 0.65 03/14/2024   CALCIUM  8.7 03/14/2024   PROT 6.5 03/14/2024   ALBUMIN 3.1 (L) 06/10/2023   AST 12 03/14/2024   ALT 7 03/14/2024   ALKPHOS 63 06/10/2023   BILITOT 0.5 03/14/2024   GFRNONAA >60 06/11/2023   GFRAA 96 01/11/2021    Lab Results  Component Value Date   WBC 11.5 (H) 10/08/2024   NEUTROABS 9.7 (H) 10/08/2024   HGB 11.4 (L) 10/08/2024   HCT 34.3 (L) 10/08/2024   MCV 88.4 10/08/2024   PLT 284 10/08/2024    Lab Results  Component Value Date   TIBC 260 10/08/2024   TIBC 307 04/08/2024   TIBC 274 10/09/2023   FERRITIN 77 10/08/2024   FERRITIN 20 04/08/2024   FERRITIN 32 10/09/2023   IRONPCTSAT 15 10/08/2024   IRONPCTSAT 10 (L) 04/08/2024   IRONPCTSAT 18 10/09/2023

## 2024-10-23 ENCOUNTER — Other Ambulatory Visit: Payer: Self-pay | Admitting: Internal Medicine

## 2024-10-24 NOTE — Telephone Encounter (Signed)
 Requested Prescriptions  Pending Prescriptions Disp Refills   carvedilol  (COREG ) 12.5 MG tablet [Pharmacy Med Name: CARVEDILOL  12.5 MG Oral Tablet] 180 tablet 1    Sig: TAKE 1 TABLET TWICE DAILY WITH MEALS     Cardiovascular: Beta Blockers 3 Failed - 10/24/2024  2:03 PM      Failed - Last BP in normal range    BP Readings from Last 1 Encounters:  10/08/24 (!) 166/79         Passed - Cr in normal range and within 360 days    Creatinine  Date Value Ref Range Status  01/11/2021 54.2 20.0 - 300.0 mg/dL Final   Creat  Date Value Ref Range Status  03/14/2024 0.65 0.60 - 1.00 mg/dL Final   Creatinine, Urine  Date Value Ref Range Status  03/14/2024 60 20 - 275 mg/dL Final         Passed - AST in normal range and within 360 days    AST  Date Value Ref Range Status  03/14/2024 12 10 - 35 U/L Final   SGOT(AST)  Date Value Ref Range Status  07/24/2012 26 15 - 37 Unit/L Final   AST (SGOT) Piccolo, Waived  Date Value Ref Range Status  03/07/2018 26 11 - 38 U/L Final         Passed - ALT in normal range and within 360 days    ALT  Date Value Ref Range Status  03/14/2024 7 6 - 29 U/L Final   SGPT (ALT)  Date Value Ref Range Status  07/24/2012 26 12 - 78 U/L Final   ALT (SGPT) Piccolo, Waived  Date Value Ref Range Status  03/07/2018 23 10 - 47 U/L Final         Passed - Last Heart Rate in normal range    Pulse Readings from Last 1 Encounters:  10/08/24 66         Passed - Valid encounter within last 6 months    Recent Outpatient Visits           3 weeks ago Type 2 diabetes mellitus with hyperlipidemia [facility])   [place] [facility] Ed[place] Marsa PARAS, DO   2 months ago Essential hypertension   [place] [facility] [place] Edman Marsa PARAS, DO   3 months ago Essential hypertension   [place] [facility] [place], Angeline ORN, NP   7 months ago Annual physical exam   [facility]  [place], Marsa PARAS, [place]

## 2024-10-27 ENCOUNTER — Ambulatory Visit (INDEPENDENT_AMBULATORY_CARE_PROVIDER_SITE_OTHER): Admitting: Otolaryngology

## 2024-11-02 ENCOUNTER — Other Ambulatory Visit: Payer: Self-pay | Admitting: Family Medicine

## 2024-11-02 DIAGNOSIS — I10 Essential (primary) hypertension: Secondary | ICD-10-CM

## 2024-11-03 ENCOUNTER — Ambulatory Visit

## 2024-11-04 DIAGNOSIS — K529 Noninfective gastroenteritis and colitis, unspecified: Secondary | ICD-10-CM | POA: Diagnosis not present

## 2024-11-04 NOTE — Telephone Encounter (Signed)
 Requested Prescriptions  Pending Prescriptions Disp Refills   amLODipine  (NORVASC ) 10 MG tablet [Pharmacy Med Name: AMLODIPINE  BESYLATE 10MG TABLETS] 90 tablet 0    Sig: TAKE 1 TABLET BY MOUTH DAILY. OKAY TO START WITH 1/2 TABLET FOR FIRST 1-2 WEEKS, THEN INCREASE TO 1 TABLET DAILY IF NEEDED     Cardiovascular: Calcium  Channel Blockers 2 Failed - 11/04/2024  2:27 PM      Failed - Last BP in normal range    BP Readings from Last 1 Encounters:  10/08/24 (!) 166/79         Passed - Last Heart Rate in normal range    Pulse Readings from Last 1 Encounters:  10/08/24 66         Passed - Valid encounter within last 6 months    Recent Outpatient Visits           1 month ago Type 2 diabetes mellitus with hyperlipidemia [facility])   [place] [facility] [place], Marsa PARAS, DO   3 months ago Essential hypertension   [place] [facility] Edman Marsa PARAS, DO   3 months ago Essential hypertension   [place] [facility] [place], Angeline ORN, NP   7 months ago Annual physical exam   [facility] [place], Marsa PARAS, [place]

## 2024-11-07 ENCOUNTER — Encounter: Payer: Self-pay | Admitting: Family Medicine

## 2024-11-07 ENCOUNTER — Encounter (HOSPITAL_BASED_OUTPATIENT_CLINIC_OR_DEPARTMENT_OTHER): Payer: Self-pay | Admitting: Pulmonary Disease

## 2024-11-07 ENCOUNTER — Other Ambulatory Visit: Payer: Self-pay | Admitting: Family Medicine

## 2024-11-07 DIAGNOSIS — F4310 Post-traumatic stress disorder, unspecified: Secondary | ICD-10-CM

## 2024-11-07 DIAGNOSIS — F339 Major depressive disorder, recurrent, unspecified: Secondary | ICD-10-CM

## 2024-11-08 NOTE — Telephone Encounter (Signed)
 Requested medication (s) are due for refill today: yes  Requested medication (s) are on the active medication list: yes  Last refill:  03/24/24; 10/08/24 end date  Future visit scheduled: yes   Notes to clinic:  Please review if refill is appropriate. Prescription end date noted to be on 10/08/24    Requested Prescriptions  Pending Prescriptions Disp Refills   Vilazodone  HCl (VIIBRYD ) 40 MG TABS [Pharmacy Med Name: VILAZODONE  HYDROCHLORIDE 40 MG Oral Tablet] 90 tablet 3    Sig: TAKE 1 TABLET EVERY DAY     Psychiatry:  Antidepressants - SSRI Passed - 11/08/2024  1:47 PM      Passed - Completed PHQ-2 or PHQ-9 in the last 360 days      Passed - Valid encounter within last 6 months    Recent Outpatient Visits           1 month ago Type 2 diabetes mellitus with hyperlipidemia [facility])   [place] [facility] [place], Marsa PARAS, DO   3 months ago Essential hypertension   [place] [facility] Ed[place] Marsa PARAS, DO   3 months ago Essential hypertension   [place] [facility] [place], Angeline ORN, NP   7 months ago Annual physical exam   [facility] [place], Mars[place] PARAS, [place]

## 2024-11-17 ENCOUNTER — Encounter: Payer: Self-pay | Admitting: Family Medicine

## 2024-11-17 ENCOUNTER — Ambulatory Visit: Admitting: Family Medicine

## 2024-11-17 VITALS — BP 140/90 | HR 65 | Ht 62.0 in | Wt 173.0 lb

## 2024-11-17 DIAGNOSIS — M720 Palmar fascial fibromatosis [Dupuytren]: Secondary | ICD-10-CM | POA: Diagnosis not present

## 2024-11-17 DIAGNOSIS — F3341 Major depressive disorder, recurrent, in partial remission: Secondary | ICD-10-CM | POA: Diagnosis not present

## 2024-11-17 DIAGNOSIS — G4733 Obstructive sleep apnea (adult) (pediatric): Secondary | ICD-10-CM

## 2024-11-17 DIAGNOSIS — I10 Essential (primary) hypertension: Secondary | ICD-10-CM | POA: Diagnosis not present

## 2024-11-17 DIAGNOSIS — F4310 Post-traumatic stress disorder, unspecified: Secondary | ICD-10-CM | POA: Diagnosis not present

## 2024-11-17 NOTE — Progress Notes (Signed)
 Subjective:    Patient ID: Amanda Wang, female    DOB: February 10, 1951, 73 y.o.   MRN: [id number]  Amanda Wang is a 73 y.o. female presenting on 11/17/2024 for Medical Management of Chronic Issues and Hypertension   HPI  Discussed the use of AI scribe software for clinical note transcription with the patient, who gave verbal consent to proceed.  History of Present Illness   Docia J Iseman Bev is a 73 year old female with hypertension who presents for a blood pressure follow-up and concerns about a hand nodule.  Hypertension and lower extremity edema - Blood pressure readings typically around 140/90 mmHg, with occasional elevations to 160-170 mmHg - Side effect edema on Amlodipine  10mg  - Reduced amlodipine  dose to 5 mg daily (half of 10mg  tablet) due to swelling, resulting in increased blood pressure - Current antihypertensive regimen includes amlodipine  5 mg daily, carvedilol  12.5 mg twice daily, and valsartan  320 mg daily - Lower extremity swelling and red rash present, consistent with stasis dermatitis - Concern about blood pressure control due to maternal history of massive stroke at similar age - Failed hydrochlorothiazide  in past due to hypokalemia had cramps on other med  Right Hand Dupuytren's Contracture R Hand dominant - Nodule present on right hand for several years, described as a ganglion cyst - Sensation of something protruding from the nodule - 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, believed to contribute to nodule development - No current impairment of hand function  Depression recurrent partial remission PTSD Controlled on medication currently  Insomnia / Sleep disturbance - Sleep disturbances present prior to current regimen - Currently taking lorazepam  0.5 mg x 2 = 1mg  and melatonin 10 mg nightly - Improved sleep quality, now sleeping well through the night      [facility] Followed by Pulmonology/Sleep Specialist Awaiting upcoming  Sleep Study in the sleep lab apt 12/08/24 Goal to get updated CPAP machine  - History of obstructive sleep apnea, previously tested and treated with CPAP - Frequently removes CPAP mask at night in past when used - No sleep study in the past 8-9 years - Daily fatigue attributed to sleep apnea - Suspects worsening sleep apnea contributing to uncontrolled hypertension       11/17/2024    6:38 PM 07/29/2024    9:45 AM 07/18/2024   10:04 AM  Depression screen PHQ 2/9  Decreased Interest 1 1 1   Down, Depressed, Hopeless 0 0 1  PHQ - 2 Score 1 1 2   Altered sleeping 1 1 1   Tired, decreased energy 1 1 1   Change in appetite 0 0 1  Feeling bad or failure about yourself  0 0 0  Trouble concentrating 0 0 0  Moving slowly or fidgety/restless 0 0 0  Suicidal thoughts 0 0 0  PHQ-9 Score 3 3  5    Difficult doing work/chores  Somewhat difficult Somewhat difficult     Data saved with a previous flowsheet row definition       07/29/2024    9:45 AM 07/18/2024   10:08 AM 03/24/2024    1:58 PM 12/13/2023   11:04 AM  GAD 7 : Generalized Anxiety Score  Nervous, Anxious, on Edge 1 0 0 1  Control/stop worrying 0 1 0 1  Worry too much - different things 0 0 1 1  Trouble relaxing 0 0 0 0  Restless 0 0 0 0  Easily annoyed or irritable 1 1 1  0  Afraid -  awful might happen 0 0 0 0  Total GAD 7 Score 2 2 2 3   Anxiety Difficulty Not difficult at all Not difficult at all Not difficult at all     Social History   Tobacco Use   Smoking status: Former    Current packs/day: 0.00    Average packs/day: 0.5 packs/day for 1 year (0.5 ttl pk-yrs)    Types: Cigarettes    Start date: 07/26/1976    Quit date: 07/26/1977    Years since quitting: 47.3    Passive exposure: Never   Smokeless tobacco: Never   Tobacco comments:    Smoked for one year, only smoked 1-2 cigarettes when she went out  Vaping Use   Vaping status: Never Used  Substance Use Topics   Alcohol  use: Yes    Alcohol /week: 0.0 standard drinks  of alcohol     Comment: on occasion 1-2 x month   Drug use: No    Review of Systems Per HPI unless specifically indicated above     Objective:    BP (!) 140/90 (BP Location: Left Arm, Cuff Size: Normal)   Pulse 65   Ht 5' 2 (1.575 m)   Wt 173 lb (78.5 kg)   SpO2 95%   BMI 31.64 kg/m   Wt Readings from Last 3 Encounters:  11/17/24 173 lb (78.5 kg)  10/08/24 169 lb 12.8 oz (77 kg)  09/30/24 166 lb 2 oz (75.4 kg)    Physical Exam Vitals and nursing note reviewed.  Constitutional:      General: She is not in acute distress.    Appearance: Normal appearance. She is well-developed. She is not diaphoretic.     Comments: Well-appearing, comfortable, cooperative  HENT:     Head: Normocephalic and atraumatic.  Eyes:     General:        Right eye: No discharge.        Left eye: No discharge.     Conjunctiva/sclera: Conjunctivae normal.  Cardiovascular:     Rate and Rhythm: Normal rate.  Pulmonary:     Effort: Pulmonary effort is normal.  Skin:    General: Skin is warm and dry.     Findings: No erythema or rash.  Neurological:     Mental Status: She is alert and oriented to person, place, and time.  Psychiatric:        Mood and Affect: Mood normal.        Behavior: Behavior normal.        Thought Content: Thought content normal.     Comments: Well groomed, good eye contact, normal speech and thoughts         Current Outpatient Medications:    albuterol  (VENTOLIN  HFA) 108 (90 Base) MCG/ACT inhaler, Inhale 2 puffs into the lungs every 4 (four) hours as needed for wheezing or shortness of breath., Disp: 8 g, Rfl: 2   atorvastatin  (LIPITOR) 40 MG tablet, Take 1 tablet (40 mg total) by mouth daily., Disp: 90 tablet, Rfl: 3   azelastine  (ASTELIN ) 0.1 % nasal spray, Place 1 spray into both nostrils 2 (two) times daily. (Patient taking differently: Place 1 spray into both nostrils 2 (two) times daily.), Disp: , Rfl:    budesonide (ENTOCORT EC) 3 MG 24 hr capsule, Take 9 mg by  mouth daily., Disp: , Rfl:    Carboxymethylcellul-Glycerin (LUBRICATING EYE DROPS OP), Place 1 drop into both eyes daily as needed (dry eyes)., Disp: , Rfl:    carvedilol  (COREG ) 12.5 MG tablet,  TAKE 1 TABLET TWICE DAILY WITH MEALS, Disp: 180 tablet, Rfl: 1   Cholecalciferol (VITAMIN D -3 PO), Take 1 tablet by mouth 1 day or 1 dose., Disp: , Rfl:    cyclobenzaprine  (FLEXERIL ) 10 MG tablet, Take 1 tablet (10 mg total) by mouth 3 (three) times daily as needed for muscle spasms. (Patient taking differently: Take 1 tablet (10 mg total) by mouth 3 (three) times daily as needed for muscle spasms.), Disp: 90 tablet, Rfl: 3   levETIRAcetam  (KEPPRA ) 500 MG tablet, Take 1 tablet (500 mg total) by mouth 2 (two) times daily., Disp: 180 tablet, Rfl: 3   LORazepam  (ATIVAN ) 1 MG tablet, Take 0.5-1 tablets (0.5-1 mg total) by mouth daily as needed for anxiety., Disp: 30 tablet, Rfl: 2   omeprazole  (PRILOSEC) 40 MG capsule, Take 1 capsule (40 mg total) by mouth daily., Disp: 90 capsule, Rfl: 3   Spacer/Aero-Holding Chambers (AEROCHAMBER PLUS WITH MASK) inhaler, Use with inhaler as needed., Disp: 1 each, Rfl: 0   valsartan  (DIOVAN ) 320 MG tablet, Take 1 tablet (320 mg total) by mouth daily., Disp: 90 tablet, Rfl: 3   Vilazodone  HCl (VIIBRYD ) 40 MG TABS, TAKE 1 TABLET EVERY DAY, Disp: 90 tablet, Rfl: 3   amLODipine  (NORVASC ) 10 MG tablet, Take 0.5 tablets (5 mg total) by mouth daily., Disp: , Rfl:    Results for orders placed or performed in visit on 10/08/24  Vitamin B12   Collection Time: 10/08/24  2:31 PM  Result Value Ref Range   Vitamin B-12 261 180 - 914 pg/mL  Iron  and TIBC   Collection Time: 10/08/24  2:31 PM  Result Value Ref Range   Iron  39 28 - 170 ug/dL   TIBC 739 749 - 549 ug/dL   Saturation Ratios 15 10.4 - 31.8 %   UIBC 221 ug/dL  Ferritin   Collection Time: 10/08/24  2:31 PM  Result Value Ref Range   Ferritin 77 11 - 307 ng/mL  CBC with Differential/Platelet   Collection Time: 10/08/24   2:31 PM  Result Value Ref Range   WBC 11.5 (H) 4.0 - 10.5 K/uL   RBC 3.88 3.87 - 5.11 MIL/uL   Hemoglobin 11.4 (L) 12.0 - 15.0 g/dL   HCT 65.6 (L) 63.9 - 53.9 %   MCV 88.4 80.0 - 100.0 fL   MCH 29.4 26.0 - 34.0 pg   MCHC 33.2 30.0 - 36.0 g/dL   RDW 84.8 88.4 - 84.4 %   Platelets 284 150 - 400 K/uL   nRBC 0.0 0.0 - 0.2 %   Neutrophils Relative % 84 %   Neutro Abs 9.7 (H) 1.7 - 7.7 K/uL   Lymphocytes Relative 10 %   Lymphs Abs 1.1 0.7 - 4.0 K/uL   Monocytes Relative 5 %   Monocytes Absolute 0.6 0.1 - 1.0 K/uL   Eosinophils Relative 0 %   Eosinophils Absolute 0.0 0.0 - 0.5 K/uL   Basophils Relative 0 %   Basophils Absolute 0.0 0.0 - 0.1 K/uL   Immature Granulocytes 1 %   Abs Immature Granulocytes 0.08 (H) 0.00 - 0.07 K/uL      Assessment & Plan:   Problem List Items Addressed This Visit     Depression, recurrent   Dupuytren contracture of right hand   Essential hypertension - Primary   Relevant Medications   amLODipine  (NORVASC ) 10 MG tablet   Obstructive sleep apnea   PTSD (post-traumatic stress disorder)      Essential hypertension  History lower extremity edema  and stasis dermatitis Blood pressure ranges from 140/90 to 170. Current regimen includes amlodipine , carvedilol , and valsartan . Family history raises stroke risk concerns.  Managed by Pulmonology/Sleep and now Awaiting sleep study for next management with CPAP treatment to help reduce BP.  Discussed spironolactone addition if CPAP therapy insufficient. Risks include kidney and potassium disruptions. Decision to delay spironolactone until after sleep study results. - Mutual agreement that close monitoring BP current meds should manage BP enough until can proceed w/ CPAP - Continue amlodipine  5 mg, carvedilol  12.5 mg twice daily, valsartan  320 mg. - Await sleep study results. - Consider spironolactone if CPAP therapy insufficient. - Monitor blood pressure closely. - Consider doubling carvedilol  if blood  pressure remains elevated and CPAP therapy is delayed.  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 Asymptomatic, no immediate intervention required. - Monitor for changes in mobility or pain. - Refer to hand specialist if symptoms worsen or impede function.  Insomnia, currently controlled with lorazepam  and melatonin Insomnia well-controlled with lorazepam  and melatonin. Reports improved sleep quality. - Continue lorazepam  0.5 mg x2 = 1mg  and melatonin 10 mg. - Ensure adequate lorazepam  supply, order in a month.  Depression recurrent / PTSD Stable scores On therapy   No orders of the defined types were placed in this encounter.   No orders of the defined types were placed in this encounter.   Follow up plan: Return if symptoms worsen or fail to improve.  Marsa Officer, DO [facility] 11/17/2024, 4:17 PM

## 2024-11-17 NOTE — Patient Instructions (Addendum)
 Thank you for coming to the office today.  For BP, it is still elevated but seems mild elevated.  I am okay without adding the extra 4th BP medication at this time as I do believe that the sleep study / CPAP will be the answer. Hopefully we will have you situated on the CPAP therapy within 3 months and Bp should be lowering  Keep a close watch on BP readings if you remain < 150 / 90 we can coast at this time without extra meds - Keep on Amlodipine  5 mg daily, Carvedilol  12.5mg  TWICE A DAY, Valsartan  320mg  daily  We could consider Carvedilol  double dose = 12.5mg  x 2 = 25mg  twice a day or a new order  I do think the new med Spironolactone (Potassium sparing diuretic) will work but it is very strong and may not be needed at this time. Also it requires frequent blood checks to avoid complications.  We can use it if you run higher BP >160 continuously  -------------  Dupuytren's Contracture Right Hand, likely from wear and tear, repetitive use.  No concerns, if it does not bother your mobility of the fingers/hand or cause significant restriction or pain, it is okay to leave it be. Not a concern. If it does limit you, we can refer to a Hand specialist, for a minor procedure.   Please schedule a Follow-up Appointment to: Return if symptoms worsen or fail to improve.  If you have any other questions or concerns, please feel free to call the office or send a message through MyChart. You may also schedule an earlier appointment if necessary.  Additionally, you may be receiving a survey about your experience at our office within a few days to 1 week by e-mail or mail. We value your feedback.  Marsa Officer, DO [facility], [place]

## 2024-11-19 ENCOUNTER — Ambulatory Visit

## 2024-11-19 VITALS — BP 158/77 | HR 68 | Ht 62.0 in | Wt 174.0 lb

## 2024-11-19 DIAGNOSIS — F513 Sleepwalking [somnambulism]: Secondary | ICD-10-CM

## 2024-11-19 DIAGNOSIS — G4733 Obstructive sleep apnea (adult) (pediatric): Secondary | ICD-10-CM

## 2024-11-19 DIAGNOSIS — I1A0 Resistant hypertension: Secondary | ICD-10-CM | POA: Diagnosis not present

## 2024-11-19 DIAGNOSIS — G4734 Idiopathic sleep related nonobstructive alveolar hypoventilation: Secondary | ICD-10-CM | POA: Diagnosis not present

## 2024-11-19 NOTE — Progress Notes (Signed)
 Pulmonology Office Visit   Subjective:  Patient ID: Amanda Wang, female    DOB: 09-22-1951  MRN: [id number]  Referred by: Edman Blunt *  CC:  Chief Complaint  Patient presents with   Sleep Apnea    Follow up      HPI Amanda Wang is a 73 y.o. female  with allergic rhinitis, provoked PE and was on [facility] for 3 months, hypertension, GERD, DM2, anxiety/depression, bariatric surgery, OSA on CPAP presents to get established for OSA.    09/08/24>OSA symptoms>HST  Respective notes from provider reviewed as appropriate to gather relevant information for patient care.   Discussed the use of AI scribe software for clinical note transcription with the patient, who gave verbal consent to proceed.  History of Present Illness   Amanda Wang is a 73 year old female who presents for follow-up of her home sleep study.  She has been diagnosed with mild sleep apnea, with recent home sleep study results indicating oxygenation levels dropping below 88% for almost two hours. A CPAP titration study is scheduled to determine the appropriate settings and assess if supplemental oxygen is needed during sleep.  Her blood pressure was recorded at 140/80-90 mmHg during a recent visit. She is currently on amlodipine , valsartan  (Diovan ), and carvedilol  (Coreg ) for hypertension.  She remains actively involved in family events, as evidenced by her attendance at her son and grandson's Christmas concert in [place].      OSA history: Diagnosed 9 years ago. Was on CPAP for mild OSA.  Difficult to tolerate with underlying sleep walking. Never been able to tolerate CPAP. Has seen ENT Dr. Soldatova.  Has discussed options of inspire implant.  Difficult to treat high blood pressure suspected because of underlying OSA. Is not necessarily thrilled about inspire and want to try non invasive approaches 1st.    PRIOR TESTS and IMAGING: PSG/HSAT: see above.    MRI Brain: 2024: normal.    HST 09/16/2024 AHI 4% 10.5, O2 nadir 80%, desaturation 130 minutes.  Weight 165 pound.     09/08/2024    2:00 PM  Results of the Epworth flowsheet  Sitting and reading 2  Watching TV 2  Sitting, inactive in a public place (e.g. a theatre or a meeting) 1  As a passenger in a car for an hour without a break 0  Lying down to rest in the afternoon when circumstances permit 3  Sitting and talking to someone 0  Sitting quietly after a lunch without alcohol  1  In a car, while stopped for a few minutes in traffic 0  Total score 9    Allergies: Fexofenadine, Amlodipine , Codeine , Methylphenidate hcl, Zoloft [sertraline], Magnevist [gadopentetate], and Sulfa antibiotics  Current Outpatient Medications:    albuterol  (VENTOLIN  HFA) 108 (90 Base) MCG/ACT inhaler, Inhale 2 puffs into the lungs every 4 (four) hours as needed for wheezing or shortness of breath., Disp: 8 g, Rfl: 2   amLODipine  (NORVASC ) 10 MG tablet, Take 0.5 tablets (5 mg total) by mouth daily., Disp: , Rfl:    atorvastatin  (LIPITOR) 40 MG tablet, Take 1 tablet (40 mg total) by mouth daily., Disp: 90 tablet, Rfl: 3   azelastine  (ASTELIN ) 0.1 % nasal spray, Place 1 spray into both nostrils 2 (two) times daily. (Patient taking differently: Place 1 spray into both nostrils 2 (two) times daily.), Disp: , Rfl:    budesonide (ENTOCORT EC) 3 MG 24 hr capsule, Take 9 mg by mouth daily., Disp: , Rfl:  Carboxymethylcellul-Glycerin (LUBRICATING EYE DROPS OP), Place 1 drop into both eyes daily as needed (dry eyes)., Disp: , Rfl:    carvedilol  (COREG ) 12.5 MG tablet, TAKE 1 TABLET TWICE DAILY WITH MEALS, Disp: 180 tablet, Rfl: 1   Cholecalciferol (VITAMIN D -3 PO), Take 1 tablet by mouth 1 day or 1 dose., Disp: , Rfl:    cyclobenzaprine  (FLEXERIL ) 10 MG tablet, Take 1 tablet (10 mg total) by mouth 3 (three) times daily as needed for muscle spasms. (Patient taking differently: Take 1 tablet (10 mg total) by mouth 3 (three) times daily as needed for  muscle spasms.), Disp: 90 tablet, Rfl: 3   levETIRAcetam  (KEPPRA ) 500 MG tablet, Take 1 tablet (500 mg total) by mouth 2 (two) times daily., Disp: 180 tablet, Rfl: 3   LORazepam  (ATIVAN ) 1 MG tablet, Take 0.5-1 tablets (0.5-1 mg total) by mouth daily as needed for anxiety., Disp: 30 tablet, Rfl: 2   omeprazole  (PRILOSEC) 40 MG capsule, Take 1 capsule (40 mg total) by mouth daily., Disp: 90 capsule, Rfl: 3   Spacer/Aero-Holding Chambers (AEROCHAMBER PLUS WITH MASK) inhaler, Use with inhaler as needed., Disp: 1 each, Rfl: 0   valsartan  (DIOVAN ) 320 MG tablet, Take 1 tablet (320 mg total) by mouth daily., Disp: 90 tablet, Rfl: 3   Vilazodone  HCl (VIIBRYD ) 40 MG TABS, TAKE 1 TABLET EVERY DAY, Disp: 90 tablet, Rfl: 3 Past Medical History:  Diagnosis Date   Advanced care planning/counseling discussion 09/16/2018   Anemia    Anxiety    Arthritis    Chiari malformation type I (HCC)    Depression    Family history of adverse reaction to anesthesia    daughter PONV   GERD (gastroesophageal reflux disease)    Hyperlipidemia    Hypertension    Iron  deficiency anemia    Irritable bowel syndrome with diarrhea    Osteoarthritis of left knee    PONV (postoperative nausea and vomiting)    Restless legs    Seasonal allergies    Sleep apnea    no Cpap use   Small intestinal bacterial overgrowth    Spinal stenosis    Vaccine reaction, initial encounter 03/11/2020   Past Surgical History:  Procedure Laterality Date   BRAIN SURGERY  09/2005   BREAST BIOPSY Right    neg   chiari malformation I decompression surgery  10/2005   COLONOSCOPY N/A 07/25/2024   Procedure: COLONOSCOPY;  Surgeon: Therisa Bi, MD;  Location: [facility];  Service: Endoscopy;  Laterality: N/A;   COLONOSCOPY WITH PROPOFOL  N/A 11/28/2018   Procedure: COLONOSCOPY WITH PROPOFOL ;  Surgeon: Therisa Bi, MD;  Location: [facility] END[facility];  Service: Gastroenterology;  Laterality: N/A;   COLONOSCOPY WITH PROPOFOL  N/A 10/27/2021    Procedure: COLONOSCOPY WITH PROPOFOL ;  Surgeon: Therisa Bi, MD;  Location: [facility] ENDOSCOPY;  Service: Gastroenterology;  Laterality: N/A;   ESOPHAGOGASTRODUODENOSCOPY N/A 10/27/2021   Procedure: ESOPHAGOGASTRODUODENOSCOPY (EGD);  Surgeon: Therisa Bi, MD;  Location: [facility] ENDOSCOPY;  Service: Gastroenterology;  Laterality: N/A;   HERNIA REPAIR     s/p gastric sleeve   KNEE ARTHROSCOPY WITH MEDIAL MENISECTOMY Left 01/12/2022   Procedure: KNEE ARTHROSCOPY WITH MEDIAL MENISECTOMY;  Surgeon: Marchia Drivers, MD;  Location: [facility] ORS;  Service: Orthopedics;  Laterality: Left;   LAPAROSCOPIC GASTRIC SLEEVE RESECTION     LARYNGOSCOPY N/A 1995   LASIK Bilateral    NASAL SEPTUM SURGERY     ORIF TOE FRACTURE N/A    TOTAL KNEE ARTHROPLASTY Left 06/05/2023   Procedure: TOTAL KNEE ARTHROPLASTY;  Surgeon: Marchia Drivers, MD;  Location: [facility] ORS;  Service: Orthopedics;  Laterality: Left;   Family History  Problem Relation Age of Onset   Hypertension Mother    Cancer Mother    Stroke Mother    Varicose Veins Mother    Heart disease Father    Breast cancer Sister 35   Breast cancer Paternal Aunt    Diabetes Paternal Grandmother    Cancer Paternal Grandmother    Cancer Paternal Aunt    Social History   Socioeconomic History   Marital status: Divorced    Spouse name: Not on file   Number of children: 2   Years of education: Not on file   Highest education level: Bachelor's degree (e.g., BA, AB, BS)  Occupational History   Occupation: part time   Tobacco Use   Smoking status: Former    Current packs/day: 0.00    Average packs/day: 0.5 packs/day for 1 year (0.5 ttl pk-yrs)    Types: Cigarettes    Start date: 07/26/1976    Quit date: 07/26/1977    Years since quitting: 47.3    Passive exposure: Never   Smokeless tobacco: Never   Tobacco comments:    Smoked for one year, only smoked 1-2 cigarettes when she went out  Vaping Use   Vaping status: Never Used  Substance and Sexual Activity    Alcohol  use: Yes    Alcohol /week: 0.0 standard drinks of alcohol     Comment: on occasion 1-2 x month   Drug use: No   Sexual activity: Yes  Other Topics Concern   Not on file  Social History Narrative   Lives alone.   Social Drivers of Corporate Investment Banker Strain: Low Risk  (06/09/2024)   Received from [facility]   Overall Financial Resource Strain (CARDIA)    Difficulty of Paying Living Expenses: Not hard at all  Food Insecurity: No Food Insecurity (06/09/2024)   Received from [facility]   Hunger Vital Sign    Within the past 12 months, you worried that your food would run out before you got the money to buy more.: Never true    Within the past 12 months, the food you bought just didn't last and you didn't have money to get more.: Never true  Transportation Needs: No Transportation Needs (06/09/2024)   Received from [facility] - Transportation    In the past 12 months, has lack of transportation kept you from medical appointments or from getting medications?: No    Lack of Transportation (Non-Medical): No  Physical Activity: Inactive (10/17/2021)   Exercise Vital Sign    Days of Exercise per Week: 0 days    Minutes of Exercise per Session: 0 min  Stress: Stress Concern Present (10/17/2021)   [employer] of Occupational Health - Occupational Stress Questionnaire    Feeling of Stress : To some extent  Social Connections: Moderately Integrated (09/22/2019)   Social Connection and Isolation Panel    Frequency of Communication with Friends and Family: More than three times a week    Frequency of Social Gatherings with Friends and Family: More than three times a week    Attends Religious Services: More than 4 times per year    Active Member of [employer] or Organizations: Yes    Attends Banker Meetings: More than 4 times per year    Marital Status: Divorced  Intimate Partner Violence: Not At Risk  (06/10/2023)  Humiliation, Afraid, Rape, and Kick questionnaire    Fear of Current or Ex-Partner: No    Emotionally Abused: No    Physically Abused: No    Sexually Abused: No       Objective:  BP (!) 158/77   Pulse 68   Ht 5' 2 (1.575 m)   Wt 174 lb (78.9 kg)   SpO2 94%   BMI 31.83 kg/m  BMI Readings from Last 3 Encounters:  11/19/24 31.83 kg/m  11/17/24 31.64 kg/m  10/08/24 31.06 kg/m    Physical Exam: Physical Exam   ENT: Normal mucosa. No hypertrophy of inferior turbinates. Tonsils are normal sized. Modified Mallampati score is normal. PULMONARY: Lungs clear to auscultation bilaterally, no adventitious breath sounds. CARDIOVASCULAR: Regular rate and rhythm, S1 S2 normal, no murmurs. ABDOMEN: Abdomen soft, nontender. Bowel sounds are normal. EXTREMITIES: No peripheral edema noted.       Diagnostic Review:  Last metabolic panel Lab Results  Component Value Date   GLUCOSE 139 (H) 03/14/2024   NA 143 03/14/2024   K 3.7 03/14/2024   CL 103 03/14/2024   CO2 31 03/14/2024   BUN 15 03/14/2024   CREATININE 0.65 03/14/2024   EGFR 94 06/26/2023   CALCIUM  8.7 03/14/2024   PHOS 3.2 07/24/2012   PROT 6.5 03/14/2024   ALBUMIN 3.1 (L) 06/10/2023   LABGLOB 3.0 10/06/2021   AGRATIO 1.6 10/06/2021   BILITOT 0.5 03/14/2024   ALKPHOS 63 06/10/2023   AST 12 03/14/2024   ALT 7 03/14/2024   ANIONGAP 10 06/11/2023         Assessment & Plan:   Assessment & Plan OSA (obstructive sleep apnea)     Resistant hypertension Cont Coreg , amlodipine  and diovan . BP will improve with OSA treatment and management of nocturnal hypoxemia.     Sleep walking     Nocturnal hypoxemia CPAP titration.        Assessment and Plan    Obstructive sleep apnea with nocturnal hypoxemia Mild obstructive sleep apnea with significant nocturnal hypoxemia, atypical for mild sleep apnea, potentially contributing to elevated blood pressure. - Proceed with CPAP titration study on  December 22nd to determine optimal settings. - Evaluate oxygenation during CPAP titration; consider supplemental oxygen if hypoxemia persists.       Return for 1 month after CPAP setup.   I personally spent a total of 20 minutes in the care of the patient today including preparing to see the patient, getting/reviewing separately obtained history, performing a medically appropriate exam/evaluation, counseling and educating, placing orders, documenting clinical information in the EHR, independently interpreting results, and communicating results.   Amanda Navia, MD

## 2024-11-19 NOTE — Patient Instructions (Signed)
  VISIT SUMMARY: Today, we reviewed the results of your home sleep study and discussed your diagnosis of mild sleep apnea. We also talked about your blood pressure management and upcoming CPAP titration study.  YOUR PLAN: -OBSTRUCTIVE SLEEP APNEA WITH NOCTURNAL HYPOXEMIA: You have been diagnosed with mild obstructive sleep apnea, which means that your breathing stops and starts during sleep. Your home sleep study showed that your oxygen levels drop below normal for almost two hours at night. We will proceed with a CPAP titration study on December 22nd to find the best settings for your CPAP machine and to see if you need extra oxygen while you sleep.  -HYPERTENSION: Your blood pressure has been elevated, and you are currently taking amlodipine , valsartan  (Diovan ), and carvedilol  (Coreg ) to manage it. The sleep apnea may be contributing to your high blood pressure, so addressing the sleep apnea might help improve your blood pressure as well.  INSTRUCTIONS: Please attend your CPAP titration study on December 22nd. This will help us  determine the best settings for your CPAP machine and whether you need supplemental oxygen during sleep.                      Contains text generated by Abridge.                                 Contains text generated by Abridge.

## 2024-12-05 ENCOUNTER — Ambulatory Visit: Payer: Self-pay

## 2024-12-05 ENCOUNTER — Ambulatory Visit

## 2024-12-05 VITALS — BP 138/70 | HR 82 | Ht 62.0 in | Wt 174.0 lb

## 2024-12-05 DIAGNOSIS — J069 Acute upper respiratory infection, unspecified: Secondary | ICD-10-CM | POA: Diagnosis not present

## 2024-12-05 NOTE — Progress Notes (Deleted)
" ° ° ° ° ° ° °  Acute Patient Visit  Physician: Kjirsten Bloodgood A Tynasia Mccaul, MD  Patient: Amanda Wang MRN: [id number] DOB: 09-28-1951 PCP: Edman Marsa JINNY, DO     Subjective:   Chief Complaint  Patient presents with   Sinus Problem    Patient is having nasal drip.     HPI: The patient is a 73 y.o. female who presents today for:   Discussed the use of AI scribe software for clinical note transcription with the patient, who gave verbal consent to proceed.  History of Present Illness              ROS:   As noted in the HPI    ASSESMENT/PLAN:  No diagnosis found.  No orders of the defined types were placed in this encounter.   Assessment and Plan                 OBJECTIVE: Vitals:   12/05/24 1513  BP: 138/70  Pulse: 82  SpO2: 95%  Weight: 174 lb (78.9 kg)  Height: 5' 2 (1.575 m)    Body mass index is 31.83 kg/m.   Physical Exam Vitals reviewed.  Constitutional:      Appearance: Normal appearance. Well-developed with normal weight.  Cardiovascular:     Rate and Rhythm: Normal rate and regular rhythm. Normal heart sounds. Normal peripheral pulses Pulmonary:     Normal breath sounds with normal effort Skin:    General: Skin is warm and dry without noticeable rash. Neurological:     General: No focal deficit present.  Psychiatric:        Mood and Affect: Mood, behavior and cognition normal       Allergies Patient is allergic to fexofenadine, amlodipine , codeine , methylphenidate hcl, zoloft [sertraline], magnevist [gadopentetate], and sulfa antibiotics.  Past Medical History Patient  has a past medical history of Advanced care planning/counseling discussion (09/16/2018), Anemia, Anxiety, Arthritis, Chiari malformation type I (HCC), Depression, Family history of adverse reaction to anesthesia, GERD (gastroesophageal reflux disease), Hyperlipidemia, Hypertension, Iron  deficiency anemia, Irritable bowel syndrome with diarrhea, Osteoarthritis  of left knee, PONV (postoperative nausea and vomiting), Restless legs, Seasonal allergies, Sleep apnea, Small intestinal bacterial overgrowth, Spinal stenosis, and Vaccine reaction, initial encounter (03/11/2020).  Surgical History Patient  has a past surgical history that includes Nasal septum surgery; chiari malformation I decompression surgery (10/2005); Hernia repair; Colonoscopy with propofol  (N/A, 11/28/2018); Breast biopsy (Right); Colonoscopy with propofol  (N/A, 10/27/2021); Esophagogastroduodenoscopy (N/A, 10/27/2021); Knee arthroscopy with medial menisectomy (Left, 01/12/2022); LASIK (Bilateral); Laparoscopic gastric sleeve resection; Total knee arthroplasty (Left, 06/05/2023); Laryngoscopy (N/A, 1995); ORIF toe fracture (N/A); Brain surgery (09/2005); and Colonoscopy (N/A, 07/25/2024).  Family History Pateint's family history includes Breast cancer in her paternal aunt; Breast cancer (age of onset: 74) in her sister; Cancer in her mother, paternal aunt, and paternal grandmother; Diabetes in her paternal grandmother; Heart disease in her father; Hypertension in her mother; Stroke in her mother; Varicose Veins in her mother.  Social History Patient  reports that she quit smoking about 47 years ago. Her smoking use included cigarettes. She started smoking about 48 years ago. She has a 0.5 pack-year smoking history. She has never been exposed to tobacco smoke. She has never used smokeless tobacco. She reports current alcohol  use. She reports that she does not use drugs.    12/05/2024 "

## 2024-12-05 NOTE — Telephone Encounter (Signed)
" °  FYI Only or Action Required?: Action required by provider: request for appointment.  Patient was last seen in primary care on 11/17/2024 by Edman Marsa PARAS, DO.  Called Nurse Triage reporting Cough.  Symptoms began several days ago.  Interventions attempted: Rest, hydration, or home remedies.  Symptoms are: gradually worsening. Productive cough and sinus drainage worsening. Cough is severe.  Triage Disposition: See Physician Within 24 Hours  Patient/caregiver understands and will follow disposition?: Yes     Copied from CRM #[id number]. Topic: Clinical - Red Word Triage >> Dec 05, 2024  8:18 AM Robinson H wrote: [facility] that prompted transfer to Nurse Triage: Landy and bloody discharge coming from nose and coughing it up, hoarse Reason for Disposition  [1] Continuous (nonstop) coughing interferes with work or school AND [2] no improvement using cough treatment per Care Advice  Answer Assessment - Initial Assessment Questions 1. ONSET: When did the cough begin?      Monday 2. SEVERITY: How bad is the cough today?      severe 3. SPUTUM: Describe the color of your sputum (e.g., none, dry cough; clear, white, yellow, green)     Green,bloody 4. HEMOPTYSIS: Are you coughing up any blood? If Yes, ask: How much? (e.g., flecks, streaks, tablespoons, etc.)     yes 5. DIFFICULTY BREATHING: Are you having difficulty breathing? If Yes, ask: How bad is it? (e.g., mild, moderate, severe)     no 6. FEVER: Do you have a fever? If Yes, ask: What is your temperature, how was it measured, and when did it start?     no 7. CARDIAC HISTORY: Do you have any history of heart disease? (e.g., heart attack, congestive heart failure)      no 8. LUNG HISTORY: Do you have any history of lung disease?  (e.g., pulmonary embolus, asthma, emphysema)     no 9. PE RISK FACTORS: Do you have a history of blood clots? (or: recent major surgery, recent prolonged travel, bedridden)      no 10. OTHER SYMPTOMS: Do you have any other symptoms? (e.g., runny nose, wheezing, chest pain)       Runny nose 11. PREGNANCY: Is there any chance you are pregnant? When was your last menstrual period?       no 12. TRAVEL: Have you traveled out of the country in the last month? (e.g., travel history, exposures)       no  Protocols used: Cough - Acute Productive-A-AH  "

## 2024-12-05 NOTE — Progress Notes (Signed)
 "    Acute Patient Visit  Physician: Amanda Klare A Lillyanna Glandon, MD  Patient: Amanda Wang MRN: [id number] DOB: [birth year] PCP: Amanda Marsa JINNY, DO     Subjective:   Chief Complaint  Patient presents with   Sinus Problem    Patient is having nasal drip.     HPI: The patient is a 73 y.o. female who presents today for:   Discussed the use of AI scribe software for clinical note transcription with the patient, who gave verbal consent to proceed.  History of Present Illness   Amanda Wang is a 73 year old female who presents with nasal drainage and hoarseness.  Upper respiratory symptoms - Nasal drainage transitioned from clear to green over several days - Hoarseness attributed to postnasal drainage running down throat - No sore throat or ear pain - Wakes up choking due to drainage - Symptoms began on Sunday afternoon - No fever - No over-the-counter medications taken except for allergy medication and Flonase - Describes breathing as 'a little bit heavy' - No chest pain reported  Constitutional symptoms - Able to eat and drink normally - Goes to bed early, around 5:30 PM, due to feeling unwell   ROS:   As noted in the HPI    ASSESMENT/PLAN:  Encounter Diagnoses  Name Primary?   URTI (acute upper respiratory infection) Yes    No orders of the defined types were placed in this encounter.   Assessment and Plan    Acute bronchitis Likely viral, secondary to upper respiratory tract infection. No indication for antibiotics. - Use OTC medications like Mucinex  and cough medicine for symptom relief. - Use nasal spray for congestion. - Take Tylenol  for symptoms. - Rest and hydrate. - Monitor for worsening symptoms such as increased shortness of breath or fever and seek medical attention if these occur.   Patient would like a medicine that makes her symptoms go away before Monday.  We discussed the natural course of a viral upper respiratory tract  infection.  Unfortunately she will have to be patient to wait for symptom resolution.     OBJECTIVE: Vitals:   12/05/24 1513  BP: 138/70  Pulse: 82  SpO2: 95%  Weight: 174 lb (78.9 kg)  Height: 5' 2 (1.575 m)    Body mass index is 31.83 kg/m.   Physical Exam Vitals reviewed.  Constitutional:      Appearance: Normal appearance. Well-developed with normal weight.  Cardiovascular:     Rate and Rhythm: Normal rate and regular rhythm. Normal heart sounds. Normal peripheral pulses Pulmonary:     Normal breath sounds with normal effort Skin:    General: Skin is warm and dry without noticeable rash. Neurological:     General: No focal deficit present.  Psychiatric:        Mood and Affect: Mood, behavior and cognition normal     HEENT: Ears: canals clear, TMs intact with normal landmarks, hearing grossly intact; Oropharynx: mucosa moist, no erythema or exudate, tonsils non-enlarged; Cervical nodes: no lymphadenopathy   Allergies Patient is allergic to fexofenadine, amlodipine , codeine , methylphenidate hcl, zoloft [sertraline], magnevist [gadopentetate], and sulfa antibiotics.  Past Medical History Patient  has a past medical history of Advanced care planning/counseling discussion (09/16/2018), Anemia, Anxiety, Arthritis, Chiari malformation type I (HCC), Depression, Family history of adverse reaction to anesthesia, GERD (gastroesophageal reflux disease), Hyperlipidemia, Hypertension, Iron  deficiency anemia, Irritable bowel syndrome with diarrhea, Osteoarthritis of left knee, PONV (postoperative nausea and vomiting), Restless legs, Seasonal allergies, Sleep  apnea, Small intestinal bacterial overgrowth, Spinal stenosis, and Vaccine reaction, initial encounter (03/11/2020).  Surgical History Patient  has a past surgical history that includes Nasal septum surgery; chiari malformation I decompression surgery (10/2005); Hernia repair; Colonoscopy with propofol  (N/A, 11/28/2018); Breast  biopsy (Right); Colonoscopy with propofol  (N/A, 10/27/2021); Esophagogastroduodenoscopy (N/A, 10/27/2021); Knee arthroscopy with medial menisectomy (Left, 01/12/2022); LASIK (Bilateral); Laparoscopic gastric sleeve resection; Total knee arthroplasty (Left, 06/05/2023); Laryngoscopy (N/A, 1995); ORIF toe fracture (N/A); Brain surgery (09/2005); and Colonoscopy (N/A, 07/25/2024).  Family History Pateint's family history includes Breast cancer in her paternal aunt; Breast cancer (age of onset: 77) in her sister; Cancer in her mother, paternal aunt, and paternal grandmother; Diabetes in her paternal grandmother; Heart disease in her father; Hypertension in her mother; Stroke in her mother; Varicose Veins in her mother.  Social History Patient  reports that she quit smoking about 47 years ago. Her smoking use included cigarettes. She started smoking about 48 years ago. She has a 0.5 pack-year smoking history. She has never been exposed to tobacco smoke. She has never used smokeless tobacco. She reports current alcohol  use. She reports that she does not use drugs.    12/05/2024 "

## 2024-12-08 ENCOUNTER — Ambulatory Visit (HOSPITAL_BASED_OUTPATIENT_CLINIC_OR_DEPARTMENT_OTHER)

## 2024-12-08 DIAGNOSIS — G4733 Obstructive sleep apnea (adult) (pediatric): Secondary | ICD-10-CM | POA: Diagnosis present

## 2024-12-12 DIAGNOSIS — G4733 Obstructive sleep apnea (adult) (pediatric): Secondary | ICD-10-CM

## 2024-12-12 NOTE — Procedures (Signed)
 "       Amanda Wang [facility] [address] [place], [place] [address] Tel: [phone]   Fax: [phone]  Titration Interpretation  Patient Name:  Amanda Wang, Amanda Wang Date:  12/08/2024 Referring Physician:  SAMMI FREDERICKS [phone]) %%startinterp%% Indications for Polysomnography The patient is a 73 year old Female who is 5' 2 and weighs 168.0 lbs. Her BMI equals 30.9.  A full night titration treatment study was performed.  Medications Taken:  ATORVASTATIN  @ 2200  CALEDOVIL @ 2200  AMLODIPINE  @ 2200  OMEPRAZOLE  @ 2200  LEVITCETRAM @ 2200  CYCLOBENZAPRINE  @ 2200  MELATONIN @ 0005   Polysomnogram Data A full night polysomnogram recorded the standard physiologic parameters including EEG, EOG, EMG, EKG, nasal and oral airflow.  Respiratory parameters of chest and abdominal movements were recorded with Respiratory Inductance Plethysmography belts.  Oxygen saturation was recorded by pulse oximetry.   Sleep Architecture The total recording time of the polysomnogram was 389.8 minutes.  The total sleep time was 296.5 minutes.  The patient spent 1.3% of total sleep time in Stage N1, 73.5% in Stage N2, 4.7% in Stages N3, and 20.4% in REM.  Sleep latency was 26.1 minutes.  REM latency was 150.5 minutes.  Sleep Efficiency was 76.1%.  Wake after Sleep Onset time was 67.0 minutes.  Titration Summary The patient was titrated at pressures ranging from 6* cm/H20 with supplemental oxygen at - up to 13* cm/H20 with supplemental oxygen at -.  The last pressure used in the study was 13* cm/H20 with supplemental oxygen at -.  Respiratory Events The polysomnogram revealed a presence of 1 obstructive, 2 centrals, and - mixed apneas resulting in an Apnea index of 0.6 events per hour.  There were 26 hypopneas (>=3% desaturation and/or arousal) resulting in an Apnea\Hypopnea Index (AHI >=3% desaturation and/or arousal) of 5.9 events per hour.  There were 3 hypopneas (>=4%  desaturation) resulting in an Apnea\Hypopnea Index (AHI >=4% desaturation) of 1.2 events per hour.  There were 55 Respiratory Effort Related Arousals resulting in a RERA index of 11.1 events per hour. The Respiratory Disturbance Index is 17.0 events per hour.  The snore index was 8.5 events per hour.  Mean oxygen saturation was 92.3%.  The lowest oxygen saturation during sleep was 83.0%.  Time spent <=88% oxygen saturation was 0.9 minutes (0.2%).  Limb Activity There were 66 limb movements recorded.  Of this total, 66 were classified as PLMs.  Of the PLMs, - were associated with arousals.  The Limb Movement index was 13.4 per hour while the PLM index was 13.4 per hour.  Cardiac Summary The average pulse rate was 63.8 bpm.  The minimum pulse rate was 55.0 bpm while the maximum pulse rate was 79.0 bpm.  Cardiac rhythm was normal/abnormal.  Comments: Poor sleep efficiency with macro sleep fragmentation at sleep onset. Sleep efficiency at 76.1% Mild PLMS with index of 13.4/min without arousals.  The lowest oxyhemoglobin saturation was 83 %.  Time spent below 88% was <1 min. CPAP pressures from 6 to 13 cm were tried. No oxygen was needed. REM was seen at CPAP 9 and 13 cm H2O. AHI4% controlled on all CPAP pressures tested but no single pressure with REM sleep had AHI3% <5. CPAP 13 cm H2O showed optimal control of OSA.  No supine sleep was obtained.  Criteria for RWA met in 1 epoch    Diagnosis:  Succesful CPAP titration study.   Recommendations: CPAP 13 cm H2O or auto CPAP pressure of  8-15 cm H2O recommended.  Side sleep.  Evaluate for delayed sleep phase syndrome.  Evaluate for RBD based on history.    This study was personally reviewed and electronically signed by: Nomar Broad, MD Accredited Board Certified in Sleep Medicine Date/Time:     %%endinterp%%  Titration Report  Patient Name: Amanda Wang Study Date: 12/08/2024  Date of Birth: [birth year] Study Type: CPAP Titration   Age: 48 year MRN #: [id number]  Sex: Female Interpreting Physician: THEODORO LAKES, [id number]  Height: 5' 2 Referring Physician: LAKES THEODORO [phone])  Weight: 168.0 lbs Recording Tech: Charlie George RPSGT  BMI: 30.9 Scoring Tech: Charlie George RPSGT  ESS: 7/24 Neck Size: 15  Mask Type RESMED AIRFIT F30I FULL-FACE Final Pressure: 13 CM H2O  Mask Size: SMALL Supplemental O2: 0   Study Overview  Lights Off: 10:39:06 PM  Count Index  Lights On: 05:08:56 AM Awakenings: 7 1.4  Time in Bed: 389.8 min. Arousals: 58 11.7  Total Sleep Time: 296.5 min. AHI (>=3% Desat and/or Ar.): 29 5.9   Sleep Efficiency: 76.1% AHI (>=4% Desat): 6 1.2   Sleep Latency: 26.1 min. Limb Movements: 66 13.4  Wake After Sleep Onset: 67.0 min. Snore: 42 8.5  REM Latency from Sleep Onset: 150.5 min. Desaturations: 30 6.1     Minimum SpO2 TST: 83.0%    Sleep Architecture  % of Time in Bed Stages Time (mins) % Sleep Time  Wake 94.0   Stage N1 4.0 1.3%  Stage N2 218.0 73.5%  Stage N3 14.0 4.7%  REM 60.5 20.4%   Arousal Summary   NREM REM Sleep Index  Respiratory Arousals 55 2 57 11.5  PLM Arousals - - - -  Isolated Limb Movement Arousals - - - -  Snore Arousals - - - -  Spontaneous Arousals 1 - 1 0.2  Total 56 2 58 11.7   Limb Movement Summary   Count Index  Isolated Limb Movements - -  Periodic Limb Movements (PLMs) 66 13.4  Total Limb Movements 66 13.4    Respiratory Summary   By Sleep Stage By Body Position Total   NREM REM Supine Non-Supine   Time (min) 236.0 60.5 - 296.5 296.5         Obstructive Apnea 1 - - 1 1  Mixed Apnea - - - - -  Central Apnea 1 1 - 2 2  Total Apneas 2 1 - 3 3  Total Apnea Index 0.5 1.0 - 0.6 0.6         Hypopneas (>=3% Desat and/or Ar.) 18 8 - 26 26  AHI (>=3% Desat and/or Ar.) 5.1 8.9 - 5.9 5.9         Hypopneas (>=4% Desat) 3 - - 3 3  AHI (>=4% Desat) 1.3 1.0 - 1.2 1.2          RERAs 53 2 - 55 55  RERA Index 13.5 2.0 - 11.1 11.1         RDI 18.6 10.9 -  17.0 17.0    Respiratory Event Type Index  Central Apneas 0.4  Obstructive Apneas 0.2  Mixed Apneas -  Central Hypopneas -  Obstructive Hypopneas 5.1  Central Apnea + Hypopnea (CAHI) 0.4  Obstructive Apnea + Hypopnea (OAHI) 5.5   Respiratory Event Durations   Apnea Hypopnea   NREM REM NREM REM  Average (seconds) 18.3 25.1 28.2 28.5  Maximum (seconds) 19.6 25.1 57.3 37.2    Oxygen Saturation Summary   Wake NREM REM TST TIB  Average SpO2 94.0% 91.6% 92.7% 91.8% 92.3%  Minimum SpO2 88.0% 88.0% 83.0% 83.0% 83.0%  Maximum SpO2 99.0% 97.0% 96.0% 97.0% 99.0%   Oxygen Saturation Distribution  Range (%) Time in range (min) Time in range (%)  90.0 - 100.0 337.2 86.6%  80.0 - 90.0 49.3 12.7%  70.0 - 80.0 - -  60.0 - 70.0 - -  50.0 - 60.0 - -  0.0 - 50.0 - -  Time Spent <=88% SpO2  Range (%) Time in range (min) Time in range (%)  0.0 - 88.0 0.9 0.2%      Count Index  Desaturations 30 6.1    Cardiac Summary   Wake NREM REM Sleep Total  Average Pulse Rate (BPM) 68.7 61.6 64.9 62.3 63.8  Minimum Pulse Rate (BPM) 61.0 55.0 57.0 55.0 55.0  Maximum Pulse Rate (BPM) 79.0 72.0 74.0 74.0 79.0   Pulse Rate Distribution:  Range (bpm) Time in range (min) Time in range (%)  0.0 - 40.0 - -  40.0 - 60.0 116.0 29.7%  60.0 - 80.0 271.1 69.5%  80.0 - 100.0 - -  100.0 - 120.0 - -  120.0 - 140.0 - -  140.0 - 200.0 - -   Titration Summary  PAP Device PAP Level O2 Level Time (min) Wake (min) NREM (min) REM (min) Supine TST (min) Sleep Eff% OA# CA# MA# Hyp# (>=3%) AHI (>=3%) Hyp# (>=4%) AHI (>=%4) RERA RDI SpO2 <=88% (min) Min SpO2 Mean SpO2 Ar. Index  - Off - 0.5 0.5 0.0 0.0  0.0%               CPAP 6 - 112.5 91.0 21.5 0.0  0.0 19.1% - - - - - -  - 4  11.2  0.0 90.0 92.2 11.2  CPAP 7 - 38.5 0.0 38.5 0.0  100.0% - - - 1 1.6 -  - 6  10.9  0.1 88.0 90.6 10.9  CPAP 8 - 27.5 0.0 25.5 2.0  100.0% - - - 2 4.4 1  2.2 10  26.2  0.2 88.0 90.7 21.8  CPAP 9 - 30.5 0.5 14.5 15.5  98.4% - - - 4  8.0 -  - 1  10.0  0.3 87.0 91.9 4.0  CPAP 10 - 27.5 0.0 27.5 0.0  100.0% - - - 1 2.2 -  - 5  13.1  0.0 92.0 92.5 10.9  CPAP 11 - 50.0 0.0 50.0 0.0  100.0% 1 - - 2 3.6 -  1.2 11  16.8  0.0 90.0 91.7 13.2  CPAP 12 - 46.0 1.0 45.0 0.0  97.8% - 1 - 8 12.0 2  4.0 14  30.7  0.0 89.0 91.4 21.3  CPAP 13 - 57.5 1.0 13.5 43.0  98.3% - 1 - 8 9.6 -  1.1 4  13.8  0.3 83.0 93.0 3.2   Hypnograms                           Technologist Comments            The patient arrived for a CPAP titration study. The patient was placed in room 1. The patient has a history of prior CPAP use but currently does not use PAP therapy. The procedure was explained, and the patient had no questions. The patient was fitted for a mask and trialed CPAP at a pressure of 6 cm H2O prior to hookup.  CPAP was started at a pressure of 6 cm H2O and was increased to a pressure of 13 cm H2O. The patient tolerated the pressures well. A small ResMed AirFit F30i full-face mask was used during the titration.           The patient slept in the right position. Mild PLMs were observed. No bruxism, oral venting, seizure, or spike wave activity was observed. Snoring was mild and not audible, but was controlled with PAP therapy. No ECG events were observed. All stages of sleep were recorded. Supine REM was not achieved at the final pressure. The patient had no bathroom breaks.   Ashling Roane Diplomate, Biomedical Engineer of Sleep Medicine  ELECTRONICALLY SIGNED ON:  12/12/2024, 6:36 PM [facility] PH: [phone]   FX: [phone] ACCREDITED BY THE AMERICAN ACADEMY OF SLEEP MEDICINE  "

## 2024-12-12 NOTE — Progress Notes (Signed)
 Successful CPAP titration on 12/08/24. CPAP 8-15 cm H2O recommended.   PLAN: - CPAP setup.  - will plan for evaluation of DSPS.  - evaluate for RBD as RWA seen in 1 epoch.

## 2025-01-13 NOTE — Procedures (Addendum)
" °  Indications for Polysomnography The patient is a 74 year old Female who is 5' 2 and weighs 168.0 lbs. Her BMI equals 30.9.  A full night titration treatment study was performed.  Medications Taken:ATORVASTATIN  @ 2200CALEDOVIL @ 2200AMLODIPINE @ 2200OMEPRAZOLE @ 2200LEVITCETRAM @ 2200CYCLOBENZAPRINE @ @ 0005 Polysomnogram Data A full night polysomnogram recorded the standard physiologic parameters including EEG, EOG, EMG, EKG, nasal and oral airflow.  Respiratory parameters of chest and abdominal movements were recorded with Respiratory Inductance Plethysmography belts.   Oxygen saturation was recorded by pulse oximetry.  Sleep Architecture The total recording time of the polysomnogram was 389.8 minutes.  The total sleep time was 296.5 minutes.  The patient spent 1.3% of total sleep time in Stage N1, 73.5% in Stage N2, 4.7% in Stages N3, and 20.4% in REM.  Sleep latency was 26.1 minutes.   REM latency was 150.5 minutes.  Sleep Efficiency was 76.1%.  Wake after Sleep Onset time was 67.0 minutes.  Titration Summary The patient was titrated at pressures ranging from 6* cm/H20 with supplemental oxygen at - up to 13* cm/H20 with supplemental oxygen at -.  The last pressure used in the study was 13* cm/H20 with supplemental oxygen at -.  Respiratory Events The polysomnogram revealed a presence of 1 obstructive, 2 centrals, and - mixed apneas resulting in an Apnea index of 0.6 events per hour.  There were 26 hypopneas (GreaterEqual to3% desaturation and/or arousal) resulting in an Apnea\Hypopnea Index (AHI  GreaterEqual to3% desaturation and/or arousal) of 5.9 events per hour.  There were 3 hypopneas (GreaterEqual to4% desaturation) resulting in an Apnea\Hypopnea Index (AHI GreaterEqual to4% desaturation) of 1.2 events per hour.  There were 55 Respiratory  Effort Related Arousals resulting in a RERA index of 11.1 events per hour. The Respiratory Disturbance Index is 17.0 events per hour.   The snore index was 8.5 events per hour.  Mean oxygen saturation was 92.3%.  The lowest oxygen saturation during sleep was 83.0%.  Time spent LessEqual to88% oxygen saturation was  minutes ().  Limb Activity There were 66 limb movements recorded.  Of this total, 66 were classified as PLMs.  Of the PLMs, - were associated with arousals.  The Limb Movement index was 13.4 per hour while the PLM index was 13.4 per hour.  Cardiac Summary The average pulse rate was 63.8 bpm.  The minimum pulse rate was 55.0 bpm while the maximum pulse rate was 79.0 bpm.  Cardiac rhythm was normal/abnormal.  Comments: - Poor sleep efficiency with macro sleep fragmentation at sleep onset. Sleep efficiency at 76.1% - Mild PLMS with index of 13.4/min without arousals. - The lowest oxyhemoglobin saturation was 83 %.  Time spent below 88% was <1 min. - CPAP pressures from 6 to 13 cm were tried. No oxygen was needed. REM was seen at CPAP 9 and 13 cm H2O. AHI4% controlled on all CPAP pressures tested but no single pressure with REM sleep had AHI3% <5. CPAP 13 cm H2O showed optimal control of OSA. - No supine sleep was obtained. - Criteria for RWA met in 1 epoch  Diagnosis: - Succesful CPAP titration study.  Recommendations: - CPAP 13 cm H2O or auto CPAP pressure of 8-15 cm H2O recommended. - Side sleep. - Evaluate for delayed sleep phase syndrome. - Evaluate for RBD based on history.   This study was personally reviewed and electronically signed by: Delano Frate, MD Accredited Board Certified in Sleep Medicine Date/Time: "

## 2025-01-15 ENCOUNTER — Telehealth: Payer: Self-pay

## 2025-01-15 DIAGNOSIS — G4733 Obstructive sleep apnea (adult) (pediatric): Secondary | ICD-10-CM

## 2025-01-15 NOTE — Telephone Encounter (Addendum)
-----   Message from Sammi Fredericks, MD sent at 12/12/2024  6:39 PM EST ----- Please call patient with results and place orders as appropriate.  Date of Study: 12/08/24  Interpretation: successful CPAP titration. No need for O2 while sleeping.   Plan: Initiate auto CPAP at 8-15 cm H2O and provide supplies.  Side sleep.    Sammi Fredericks, MD.    Left message on VM for patient to call to give results and discuss placing orders to initiate CPAP start.

## 2025-01-16 ENCOUNTER — Telehealth: Payer: Self-pay

## 2025-01-16 NOTE — Telephone Encounter (Signed)
 Copied from CRM [id number]. Topic: Clinical - Lab/Test Results >> Jan 15, 2025  2:54 PM Dedra B wrote: Reason for CRM: Patient returning call regarding sleep study results. Attempted to call CAL and no answer. Please call patient.   See phone note from 01/15/25

## 2025-01-16 NOTE — Telephone Encounter (Signed)
 Order placed patient aware.NFN

## 2025-03-18 ENCOUNTER — Other Ambulatory Visit

## 2025-03-25 ENCOUNTER — Encounter: Admitting: Family Medicine

## 2025-04-07 ENCOUNTER — Inpatient Hospital Stay

## 2025-04-08 ENCOUNTER — Inpatient Hospital Stay: Admitting: Oncology

## 2025-04-08 ENCOUNTER — Inpatient Hospital Stay
